# Patient Record
Sex: Male | Born: 1935
Health system: Southern US, Community
[De-identification: ages and names within clinical notes are randomized; demographics above are authoritative.]

## PROBLEM LIST (undated history)

## (undated) DIAGNOSIS — K449 Diaphragmatic hernia without obstruction or gangrene: Secondary | ICD-10-CM

## (undated) DIAGNOSIS — Z5189 Encounter for other specified aftercare: Secondary | ICD-10-CM

## (undated) DIAGNOSIS — K222 Esophageal obstruction: Secondary | ICD-10-CM

## (undated) DIAGNOSIS — IMO0001 Reserved for inherently not codable concepts without codable children: Secondary | ICD-10-CM

## (undated) DIAGNOSIS — E213 Hyperparathyroidism, unspecified: Secondary | ICD-10-CM

## (undated) DIAGNOSIS — G2581 Restless legs syndrome: Secondary | ICD-10-CM

## (undated) DIAGNOSIS — J45909 Unspecified asthma, uncomplicated: Secondary | ICD-10-CM

## (undated) DIAGNOSIS — I10 Essential (primary) hypertension: Secondary | ICD-10-CM

## (undated) DIAGNOSIS — K635 Polyp of colon: Secondary | ICD-10-CM

## (undated) DIAGNOSIS — J189 Pneumonia, unspecified organism: Secondary | ICD-10-CM

## (undated) DIAGNOSIS — B3781 Candidal esophagitis: Secondary | ICD-10-CM

## (undated) DIAGNOSIS — D518 Other vitamin B12 deficiency anemias: Secondary | ICD-10-CM

## (undated) DIAGNOSIS — T7840XA Allergy, unspecified, initial encounter: Secondary | ICD-10-CM

## (undated) DIAGNOSIS — M1990 Unspecified osteoarthritis, unspecified site: Secondary | ICD-10-CM

## (undated) DIAGNOSIS — R296 Repeated falls: Secondary | ICD-10-CM

## (undated) DIAGNOSIS — G4733 Obstructive sleep apnea (adult) (pediatric): Secondary | ICD-10-CM

## (undated) DIAGNOSIS — K5730 Diverticulosis of large intestine without perforation or abscess without bleeding: Secondary | ICD-10-CM

## (undated) DIAGNOSIS — K219 Gastro-esophageal reflux disease without esophagitis: Secondary | ICD-10-CM

## (undated) DIAGNOSIS — K648 Other hemorrhoids: Secondary | ICD-10-CM

## (undated) HISTORY — DX: Diverticulosis of large intestine without perforation or abscess without bleeding: K57.30

## (undated) HISTORY — DX: Unspecified osteoarthritis, unspecified site: M19.90

## (undated) HISTORY — PX: EYE SURGERY: SHX253

## (undated) HISTORY — PX: LUMBAR FUSION: SHX111

## (undated) HISTORY — DX: Esophageal obstruction: K22.2

## (undated) HISTORY — DX: Candidal esophagitis: B37.81

## (undated) HISTORY — PX: PICC LINE PLACE PERIPHERAL (ARMC HX): HXRAD1248

## (undated) HISTORY — PX: CATARACT EXTRACTION: SUR2

## (undated) HISTORY — DX: Reserved for inherently not codable concepts without codable children: IMO0001

## (undated) HISTORY — DX: Other hemorrhoids: K64.8

## (undated) HISTORY — DX: Obstructive sleep apnea (adult) (pediatric): G47.33

## (undated) HISTORY — DX: Unspecified asthma, uncomplicated: J45.909

## (undated) HISTORY — DX: Encounter for other specified aftercare: Z51.89

## (undated) HISTORY — PX: OTHER SURGICAL HISTORY: SHX169

## (undated) HISTORY — DX: Restless legs syndrome: G25.81

## (undated) HISTORY — DX: Other vitamin B12 deficiency anemias: D51.8

## (undated) HISTORY — DX: Allergy, unspecified, initial encounter: T78.40XA

## (undated) HISTORY — DX: Diaphragmatic hernia without obstruction or gangrene: K44.9

## (undated) HISTORY — DX: Polyp of colon: K63.5

## (undated) HISTORY — DX: Hyperparathyroidism, unspecified: E21.3

## (undated) HISTORY — DX: Gastro-esophageal reflux disease without esophagitis: K21.9

## (undated) HISTORY — PX: COLONOSCOPY: SHX174

---

## 1996-06-16 HISTORY — PX: TOTAL KNEE ARTHROPLASTY: SHX125

## 1996-06-16 HISTORY — PX: HIP SURGERY: SHX245

## 2000-07-17 DIAGNOSIS — B3781 Candidal esophagitis: Secondary | ICD-10-CM

## 2000-07-17 HISTORY — DX: Candidal esophagitis: B37.81

## 2000-07-28 ENCOUNTER — Ambulatory Visit (HOSPITAL_COMMUNITY): Admission: RE | Admit: 2000-07-28 | Discharge: 2000-07-28 | Payer: Self-pay | Admitting: Gastroenterology

## 2001-07-07 ENCOUNTER — Ambulatory Visit (HOSPITAL_COMMUNITY): Admission: RE | Admit: 2001-07-07 | Discharge: 2001-07-07 | Payer: Self-pay | Admitting: Gastroenterology

## 2001-07-07 ENCOUNTER — Encounter: Payer: Self-pay | Admitting: Gastroenterology

## 2001-08-27 ENCOUNTER — Ambulatory Visit (HOSPITAL_COMMUNITY): Admission: RE | Admit: 2001-08-27 | Discharge: 2001-08-27 | Payer: Self-pay | Admitting: Internal Medicine

## 2001-08-27 ENCOUNTER — Encounter: Payer: Self-pay | Admitting: Internal Medicine

## 2002-05-27 DIAGNOSIS — K635 Polyp of colon: Secondary | ICD-10-CM

## 2002-05-27 DIAGNOSIS — K222 Esophageal obstruction: Secondary | ICD-10-CM

## 2002-05-27 DIAGNOSIS — D126 Benign neoplasm of colon, unspecified: Secondary | ICD-10-CM

## 2002-05-27 HISTORY — DX: Esophageal obstruction: K22.2

## 2002-05-27 HISTORY — DX: Polyp of colon: K63.5

## 2002-08-12 ENCOUNTER — Ambulatory Visit (HOSPITAL_COMMUNITY): Admission: RE | Admit: 2002-08-12 | Discharge: 2002-08-12 | Payer: Self-pay | Admitting: Gastroenterology

## 2002-08-12 ENCOUNTER — Encounter: Payer: Self-pay | Admitting: Gastroenterology

## 2003-01-10 ENCOUNTER — Ambulatory Visit (HOSPITAL_COMMUNITY): Admission: RE | Admit: 2003-01-10 | Discharge: 2003-01-10 | Payer: Self-pay | Admitting: Gastroenterology

## 2003-02-22 ENCOUNTER — Ambulatory Visit (HOSPITAL_COMMUNITY): Admission: RE | Admit: 2003-02-22 | Discharge: 2003-02-22 | Payer: Self-pay | Admitting: Urology

## 2003-02-22 ENCOUNTER — Encounter: Payer: Self-pay | Admitting: Urology

## 2003-02-22 ENCOUNTER — Ambulatory Visit (HOSPITAL_BASED_OUTPATIENT_CLINIC_OR_DEPARTMENT_OTHER): Admission: RE | Admit: 2003-02-22 | Discharge: 2003-02-22 | Payer: Self-pay | Admitting: Urology

## 2003-12-20 ENCOUNTER — Ambulatory Visit (HOSPITAL_COMMUNITY): Admission: RE | Admit: 2003-12-20 | Discharge: 2003-12-20 | Payer: Self-pay | Admitting: Urology

## 2003-12-20 ENCOUNTER — Ambulatory Visit (HOSPITAL_BASED_OUTPATIENT_CLINIC_OR_DEPARTMENT_OTHER): Admission: RE | Admit: 2003-12-20 | Discharge: 2003-12-20 | Payer: Self-pay | Admitting: Urology

## 2004-04-19 ENCOUNTER — Ambulatory Visit: Payer: Self-pay | Admitting: Gastroenterology

## 2004-06-16 DIAGNOSIS — K648 Other hemorrhoids: Secondary | ICD-10-CM

## 2004-06-16 HISTORY — DX: Other hemorrhoids: K64.8

## 2005-02-21 ENCOUNTER — Ambulatory Visit: Payer: Self-pay | Admitting: Gastroenterology

## 2005-03-05 ENCOUNTER — Ambulatory Visit: Payer: Self-pay | Admitting: Gastroenterology

## 2005-05-23 ENCOUNTER — Ambulatory Visit: Payer: Self-pay | Admitting: Gastroenterology

## 2005-05-30 ENCOUNTER — Ambulatory Visit: Payer: Self-pay | Admitting: Gastroenterology

## 2006-02-20 ENCOUNTER — Ambulatory Visit: Payer: Self-pay | Admitting: Gastroenterology

## 2006-05-20 ENCOUNTER — Ambulatory Visit (HOSPITAL_COMMUNITY): Admission: RE | Admit: 2006-05-20 | Discharge: 2006-05-20 | Payer: Self-pay | Admitting: Internal Medicine

## 2006-05-29 ENCOUNTER — Encounter: Admission: RE | Admit: 2006-05-29 | Discharge: 2006-05-29 | Payer: Self-pay | Admitting: Orthopedic Surgery

## 2006-06-16 DIAGNOSIS — K219 Gastro-esophageal reflux disease without esophagitis: Secondary | ICD-10-CM

## 2006-06-16 HISTORY — PX: PARATHYROIDECTOMY: SHX19

## 2006-06-16 HISTORY — DX: Gastro-esophageal reflux disease without esophagitis: K21.9

## 2006-07-10 ENCOUNTER — Ambulatory Visit (HOSPITAL_COMMUNITY): Admission: RE | Admit: 2006-07-10 | Discharge: 2006-07-10 | Payer: Self-pay | Admitting: Internal Medicine

## 2006-10-23 ENCOUNTER — Ambulatory Visit (HOSPITAL_COMMUNITY): Admission: RE | Admit: 2006-10-23 | Discharge: 2006-10-23 | Payer: Self-pay | Admitting: Internal Medicine

## 2006-10-27 ENCOUNTER — Ambulatory Visit (HOSPITAL_COMMUNITY): Admission: RE | Admit: 2006-10-27 | Discharge: 2006-10-27 | Payer: Self-pay | Admitting: Internal Medicine

## 2006-11-23 ENCOUNTER — Encounter (HOSPITAL_COMMUNITY): Admission: RE | Admit: 2006-11-23 | Discharge: 2007-02-21 | Payer: Self-pay | Admitting: Surgery

## 2007-01-01 ENCOUNTER — Encounter: Admission: RE | Admit: 2007-01-01 | Discharge: 2007-01-01 | Payer: Self-pay | Admitting: Sports Medicine

## 2007-01-05 ENCOUNTER — Encounter: Admission: RE | Admit: 2007-01-05 | Discharge: 2007-01-05 | Payer: Self-pay | Admitting: Sports Medicine

## 2007-01-11 ENCOUNTER — Ambulatory Visit (HOSPITAL_COMMUNITY): Admission: RE | Admit: 2007-01-11 | Discharge: 2007-01-12 | Payer: Self-pay | Admitting: Surgery

## 2007-01-11 ENCOUNTER — Encounter (INDEPENDENT_AMBULATORY_CARE_PROVIDER_SITE_OTHER): Payer: Self-pay | Admitting: Surgery

## 2007-02-19 ENCOUNTER — Encounter: Admission: RE | Admit: 2007-02-19 | Discharge: 2007-02-19 | Payer: Self-pay | Admitting: Sports Medicine

## 2007-03-03 ENCOUNTER — Encounter: Admission: RE | Admit: 2007-03-03 | Discharge: 2007-03-03 | Payer: Self-pay | Admitting: Sports Medicine

## 2007-04-14 ENCOUNTER — Inpatient Hospital Stay (HOSPITAL_COMMUNITY): Admission: RE | Admit: 2007-04-14 | Discharge: 2007-04-17 | Payer: Self-pay | Admitting: Orthopedic Surgery

## 2007-05-10 ENCOUNTER — Ambulatory Visit: Payer: Self-pay

## 2007-05-18 ENCOUNTER — Ambulatory Visit (HOSPITAL_COMMUNITY): Admission: RE | Admit: 2007-05-18 | Discharge: 2007-05-18 | Payer: Self-pay | Admitting: Chiropractic Medicine

## 2007-05-18 ENCOUNTER — Ambulatory Visit: Payer: Self-pay | Admitting: Surgery

## 2007-05-18 ENCOUNTER — Encounter (INDEPENDENT_AMBULATORY_CARE_PROVIDER_SITE_OTHER): Payer: Self-pay | Admitting: Sports Medicine

## 2007-05-27 ENCOUNTER — Ambulatory Visit: Payer: Self-pay | Admitting: Gastroenterology

## 2007-05-27 LAB — CONVERTED CEMR LAB
AST: 50 units/L — ABNORMAL HIGH (ref 0–37)
Basophils Relative: 0.7 % (ref 0.0–1.0)
Bilirubin, Direct: 0.1 mg/dL (ref 0.0–0.3)
Eosinophils Absolute: 0.6 10*3/uL (ref 0.0–0.6)
Folate: 11.7 ng/mL
Hemoglobin: 10.8 g/dL — ABNORMAL LOW (ref 13.0–17.0)
Lymphocytes Relative: 26.3 % (ref 12.0–46.0)
MCV: 91.3 fL (ref 78.0–100.0)
Neutro Abs: 3 10*3/uL (ref 1.4–7.7)
Platelets: 207 10*3/uL (ref 150–400)
RBC: 3.53 M/uL — ABNORMAL LOW (ref 4.22–5.81)
RDW: 15.1 % — ABNORMAL HIGH (ref 11.5–14.6)
Total Protein: 6.7 g/dL (ref 6.0–8.3)
Transferrin: 188.4 mg/dL — ABNORMAL LOW (ref >=212.0)
Vitamin B-12: 1087 pg/mL — ABNORMAL HIGH (ref 211–911)
WBC: 5.5 10*3/uL (ref 4.5–10.5)

## 2007-05-31 ENCOUNTER — Encounter: Payer: Self-pay | Admitting: Gastroenterology

## 2007-05-31 ENCOUNTER — Ambulatory Visit: Payer: Self-pay | Admitting: Gastroenterology

## 2007-05-31 DIAGNOSIS — K5730 Diverticulosis of large intestine without perforation or abscess without bleeding: Secondary | ICD-10-CM | POA: Insufficient documentation

## 2007-06-14 DIAGNOSIS — G2581 Restless legs syndrome: Secondary | ICD-10-CM

## 2007-06-14 DIAGNOSIS — K219 Gastro-esophageal reflux disease without esophagitis: Secondary | ICD-10-CM | POA: Insufficient documentation

## 2007-06-14 DIAGNOSIS — D518 Other vitamin B12 deficiency anemias: Secondary | ICD-10-CM

## 2007-06-24 ENCOUNTER — Encounter: Admission: RE | Admit: 2007-06-24 | Discharge: 2007-06-24 | Payer: Self-pay | Admitting: Sports Medicine

## 2008-06-26 ENCOUNTER — Encounter: Payer: Self-pay | Admitting: Gastroenterology

## 2008-07-14 ENCOUNTER — Ambulatory Visit: Payer: Self-pay | Admitting: Gastroenterology

## 2008-07-14 LAB — CONVERTED CEMR LAB
Tissue Transglutaminase Ab, IgA: 0.5 units (ref <7)
Transferrin: 256 mg/dL (ref >=212.0)
Vitamin B-12: 201 pg/mL — ABNORMAL LOW (ref 211–911)

## 2008-07-27 ENCOUNTER — Ambulatory Visit: Payer: Self-pay | Admitting: Gastroenterology

## 2008-07-27 LAB — CONVERTED CEMR LAB
OCCULT 1: NEGATIVE
OCCULT 3: NEGATIVE
OCCULT 5: NEGATIVE

## 2008-09-19 ENCOUNTER — Encounter: Payer: Self-pay | Admitting: Gastroenterology

## 2009-03-06 ENCOUNTER — Ambulatory Visit (HOSPITAL_COMMUNITY): Admission: RE | Admit: 2009-03-06 | Discharge: 2009-03-06 | Payer: Self-pay | Admitting: Internal Medicine

## 2009-08-20 ENCOUNTER — Telehealth: Payer: Self-pay | Admitting: Gastroenterology

## 2010-03-28 ENCOUNTER — Ambulatory Visit: Admission: AD | Admit: 2010-03-28 | Discharge: 2010-03-28 | Payer: Self-pay | Admitting: Internal Medicine

## 2010-03-28 ENCOUNTER — Encounter: Payer: Self-pay | Admitting: Pulmonary Disease

## 2010-05-03 ENCOUNTER — Ambulatory Visit: Payer: Self-pay | Admitting: Pulmonary Disease

## 2010-05-03 DIAGNOSIS — G4733 Obstructive sleep apnea (adult) (pediatric): Secondary | ICD-10-CM | POA: Insufficient documentation

## 2010-05-03 DIAGNOSIS — J45909 Unspecified asthma, uncomplicated: Secondary | ICD-10-CM | POA: Insufficient documentation

## 2010-05-03 DIAGNOSIS — H409 Unspecified glaucoma: Secondary | ICD-10-CM | POA: Insufficient documentation

## 2010-05-03 DIAGNOSIS — E213 Hyperparathyroidism, unspecified: Secondary | ICD-10-CM

## 2010-06-21 ENCOUNTER — Encounter: Payer: Self-pay | Admitting: Pulmonary Disease

## 2010-07-18 NOTE — Letter (Signed)
Summary: CMN/[facility]  CMN/[facility]   Imported By: Lester Taylor 06/28/2010 08:01:21  _____________________________________________________________________  External Attachment:    Type:   Image     Comment:   External Document

## 2010-07-18 NOTE — Assessment & Plan Note (Signed)
Summary: consult for osa   Visit Type:  Initial Consult Copy to:  [name] MD Primary Calayah Guadarrama/Referring Venetta Knee:  [name], MD  CC:  Sleep Consult. pt c/o pt wakes up multiple times during the night and restless legs bothers him at night and he is up for an hr due to that.  History of Present Illness: The pt is a 77y/o male who I have been asked to see for management of osa.  He recently underwent split night study where he was found to have moderate osa with AHI of 25/hr and desat to 85%.  He was then fitted with a medium resmed quattro FFM, and went thru a titration process where he was tried on cpap up to 11cm, then ultimately ended up on bilevel 15/11.  It is unclear from the available data why he was changed over to bilevel.  The pt has been told that he has loud snoring, as well as an abnormal breathing pattern during sleep.  He goes to bed at 1130pm, and arises at 5am to start his day.  He has frequent awakenings during the night, but feels adequately rested on arising.  During the day however, he will fall asleep easily if he sits down to read, watch tv, or even with eating.  He can also fall asleep during conversations.  He notes some sleepiness with driving.  The pt states his weight is neutral over the last 2 yrs.  Current Medications (verified): 1)  Mirapex 1 Mg Tabs (Pramipexole Dihydrochloride) .... Take Two By Mouth Once Daily 2)  Advair Diskus 100-50 Mcg/dose Misc (Fluticasone-Salmeterol) .... Take 2 Puffs Once Daily As Needed 3)  Allbee/c  Tabs (B Complex-C) .... Take One By Mouth Once Daily 4)  Nascobal 500 Mcg/0.41ml Soln (Cyanocobalamin) .... One Spray, One Nostril, Once A Week 5)  Vit B12 Injections .... Once A Month  Allergies (verified): No Known Drug Allergies  Past History:  Past Medical History:  OBSTRUCTIVE SLEEP APNEA (ICD-327.23)--AHI 25/hr 2011 ASTHMA (ICD-493.90) GLAUCOMA (ICD-365.9) HYPERPARATHYROIDISM UNSPECIFIED (ICD-252.00) POLYP, COLON  (ICD-211.3) ANEMIA, VITAMIN B12 DEFICIENCY (ICD-281.1) RESTLESS LEG SYNDROME (ICD-333.94) GASTROESOPHAGEAL REFLUX DISEASE (ICD-530.81) DIVERTICULOSIS, COLON (ICD-562.10)      Past Surgical History: parathyroidectomy Knee Replacement 10/09 left  Hip Replacement right 3/09 hand surgery  Family History: Reviewed history from 07/14/2008 and no changes required. FH of Brain Cancer:3 brothers Family History of Heart Disease: Father, Sister, both deceased MI asthma: father  Social History: Reviewed history from 07/14/2008 and no changes required. 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  Alcohol Use - no Daily Caffeine Use Pepsi Illicit Drug Use - no  Review of Systems       The patient complains of nasal congestion/difficulty breathing through nose, hand/feet swelling, and joint stiffness or pain.  The patient denies shortness of breath with activity, shortness of breath at rest, productive cough, non-productive cough, coughing up blood, chest pain, irregular heartbeats, acid heartburn, indigestion, loss of appetite, weight change, abdominal pain, difficulty swallowing, sore throat, tooth/dental problems, headaches, sneezing, itching, ear ache, anxiety, depression, rash, change in color of mucus, and fever.    Vital Signs:  Patient profile:   77 year old male Height:      74 inches Weight:      185.13 pounds BMI:     23.86 O2 Sat:      95 % on Room air Temp:     98.7 degrees F oral Pulse rate:   64 / minute BP sitting:  152 / 88  (left arm) Cuff size:   regular  Vitals Entered By: Carver Fila (May 03, 2010 3:53 PM)  O2 Flow:  Room air CC: Sleep Consult. pt c/o pt wakes up multiple times during the night, restless legs bothers him at night and he is up for an hr due to that Comments meds and allergies updated Phone number updated Carver Fila  May 03, 2010 3:53 PM    Physical Exam  General:  wd male in nad Eyes:  PERRLA and EOMI.   Nose:   mild deviation to left, no obstruction Mouth:  mild elongation of soft palate and uvula Neck:  no jvd, tmg, LN Lungs:  clear to auscultation Heart:  rrr, no mrg Abdomen:  soft and nontender ,bs+ Extremities:  LLE edema >right, no cyanosis pulses intact distally Neurologic:  alert and oriented,moves all 4.   Impression & Recommendations:  Problem # 1:  OBSTRUCTIVE SLEEP APNEA (ICD-327.23) the pt has moderate osa by his most recent sleep study, and clearly has significant daytime sleepiness.  I have had a long discussion with the pt about sleep apnea, including its impact on QOL and CV health.  I have reviewed the various treatments for this degree of osa, with dental appliance and cpap being his best options.  After a discussion, the pt wishes to try cpap first.  It is really unclear from his study what his optimal pressure may be, and therefore will start him out on a moderate pressure to allow for adaptation.  Will then use auto mode at home to optimize his pressure once he is comfortable wearing the device.  The pt is agreeable to this approach.  Medications Added to Medication List This Visit: 1)  Advair Diskus 100-50 Mcg/dose Misc (Fluticasone-salmeterol) .... Take 2 puffs once daily as needed 2)  Vit B12 Injections  .... Once a month  Other Orders: Consultation Level IV (19147) DME Referral (DME)  Patient Instructions: 1)  will start on cpap to treat your sleep apnea.  Please call if having pressure tolerance issues 2)  followup with me in 5 weeks.    Immunization History:  Influenza Immunization History:    Influenza:  historical (03/16/2010)  Pneumovax Immunization History:    Pneumovax:  historical (03/16/2008)

## 2010-07-18 NOTE — Progress Notes (Signed)
Summary: Refill omeprazole   Phone Note From Pharmacy   Caller: [facility]* Summary of Call: refill requested foromeprazole 20 mg Initial call taken by: Ashok Cordia RN,  August 20, 2009 3:51 PM    Prescriptions: OMEPRAZOLE 40 MG  CPDR (OMEPRAZOLE) 1 twice a day 30 minutes before meals  #60 x 6   Entered by:   Ashok Cordia RN   Authorized by:   Mardella Layman MD [facility]   Signed by:   Ashok Cordia RN on 08/20/2009   Method used:   Electronically to        [facility]* (retail)       [address]       [place], [place]  [address]       Ph: [phone]       Fax: [phone]   RxID:   [id number]   Appended Document: Refill omeprazole Dose correction.  Should be 20 mg   Clinical Lists Changes  Medications: Changed medication from OMEPRAZOLE 40 MG  CPDR (OMEPRAZOLE) 1 twice a day 30 minutes before meals to OMEPRAZOLE 20 MG  CPDR (OMEPRAZOLE) 1 twice a day 30 minutes before meals - Signed Rx of OMEPRAZOLE 20 MG  CPDR (OMEPRAZOLE) 1 twice a day 30 minutes before meals;  #60 x 6;  Signed;  Entered by: Ashok Cordia RN;  Authorized by: Mardella Layman MD [facility];  Method used: Electronically to [facility]*, [address] [place], [place], [place]  [address], Ph: [phone], Fax: [phone]    Prescriptions: OMEPRAZOLE 20 MG  CPDR (OMEPRAZOLE) 1 twice a day 30 minutes before meals  #60 x 6   Entered by:   Ashok Cordia RN   Authorized by:   Mardella Layman MD [facility]   Signed by:   Ashok Cordia RN on 08/21/2009   Method used:   Electronically to        [facility]* (retail)       [address]       [place], [place]  [address]       Ph: [phone]       Fax: [phone]   RxID:   [id number]

## 2010-07-29 ENCOUNTER — Encounter: Payer: Self-pay | Admitting: Pulmonary Disease

## 2010-07-29 ENCOUNTER — Ambulatory Visit (INDEPENDENT_AMBULATORY_CARE_PROVIDER_SITE_OTHER): Payer: Medicare Other | Admitting: Pulmonary Disease

## 2010-07-29 DIAGNOSIS — G4733 Obstructive sleep apnea (adult) (pediatric): Secondary | ICD-10-CM

## 2010-07-30 ENCOUNTER — Telehealth (INDEPENDENT_AMBULATORY_CARE_PROVIDER_SITE_OTHER): Payer: Self-pay | Admitting: *Deleted

## 2010-08-07 NOTE — Progress Notes (Signed)
Summary: need cpap re-eval notes  Phone Note From Pharmacy   Caller: tammy with [facility] Call For: clance  Summary of Call: patient was seen yesterday and she needs updated notes for his CPAP re-evaluation. She can be reached [phone] fax [phone] Initial call taken by: Vedia Coffer,  July 30, 2010 9:17 AM  Follow-up for Phone Call        [facility] for Tammy at [facility] to call back. Abigail Miyamoto RN  July 30, 2010 11:06 AM   Tammy advised [facility] has not completed note yet.  I will forward message to [facility] to let him know we need OV note completed so we can send to [facility]. Carron Curie CMA  July 30, 2010 12:00 PM   Additional Follow-up for Phone Call Additional follow up Details #1::        we do not send notes to dme companies.  Those are pt's private records.  If they need something documenting he was seen and is doing well, would be happy to write something up that we can send over to them.  Let me know   Additional Follow-up by: Barbaraann Share MD,  July 30, 2010 1:59 PM    Additional Follow-up for Phone Call Additional follow up Details #2::    Spoke with Tammy and notified of the above response per [facility].  She states that it would be okay to just send summary of the visit, stating why the pt was here and if they are compliant with CPAP or not.  Pls advise thanks Follow-up by: Vernie Murders,  July 30, 2010 2:02 PM  Additional Follow-up for Phone Call Additional follow up Details #3:: Details for Additional Follow-up Action Taken: put note in box  Note was faxed to Tammy's attn at the number provided and placed in [facility] scan folder.  Vernie Murders  July 30, 2010 2:21 PM  Additional Follow-up by: Barbaraann Share MD,  July 30, 2010 2:05 PM

## 2010-08-09 ENCOUNTER — Encounter: Payer: Self-pay | Admitting: Pulmonary Disease

## 2010-08-13 NOTE — Assessment & Plan Note (Signed)
Summary: Derrick Sanchez for osa   Copy to:  Derrick Perches MD Primary Provider/Referring Provider:  [name], MD  CC:  Overdue f/u appt for OSA.  Pt states he is wearing his cpap machine "most every night."  Appreox 4 1/2 hours per night.  Pt states mask "hurts his nose."  Pt denies any complaints with pressure. Marland Kitchen  History of Present Illness: The pt comes in today for f/u of his osa.  He was started on cpap last visit at moderate pressure level, and has been wearing very compliantly by his recent download.  He has no issues with pressure tolerance, but is having some mask fit issues.  He is noting soreness over the bridge of his nose.  The pt states he is definitely sleeping better at night, and has seen improved alertness during the day.    Medications Prior to Update: 1)  Mirapex 1 Mg Tabs (Pramipexole Dihydrochloride) .... Take Two By Mouth Once Daily 2)  Advair Diskus 100-50 Mcg/dose Misc (Fluticasone-Salmeterol) .... Take 2 Puffs Once Daily As Needed 3)  Allbee/c  Tabs (B Complex-C) .... Take One By Mouth Once Daily 4)  Nascobal 500 Mcg/0.49ml Soln (Cyanocobalamin) .... One Spray, One Nostril, Once A Week 5)  Vit B12 Injections .... Once A Month  Allergies (verified): No Known Drug Allergies  Past History:  Past medical, surgical, family and social histories (including risk factors) reviewed, and no changes noted (except as noted below).  Past Medical History: Reviewed history from 05/03/2010 and no changes required.  OBSTRUCTIVE SLEEP APNEA (ICD-327.23)--AHI 25/hr 2011 ASTHMA (ICD-493.90) GLAUCOMA (ICD-365.9) HYPERPARATHYROIDISM UNSPECIFIED (ICD-252.00) POLYP, COLON (ICD-211.3) ANEMIA, VITAMIN B12 DEFICIENCY (ICD-281.1) RESTLESS LEG SYNDROME (ICD-333.94) GASTROESOPHAGEAL REFLUX DISEASE (ICD-530.81) DIVERTICULOSIS, COLON (ICD-562.10)      Past Surgical History: Reviewed history from 05/03/2010 and no changes required. parathyroidectomy Knee Replacement 10/09 left  Hip Replacement  right 3/09 hand surgery  Family History: Reviewed history from 05/03/2010 and no changes required. FH of Brain Cancer:3 brothers Family History of Heart Disease: Father, Sister, both deceased MI asthma: father  Social History: Reviewed history from 05/03/2010 and no changes required. 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  Alcohol Use - no Daily Caffeine Use Pepsi Illicit Drug Use - no  Review of Systems       The patient complains of shortness of breath with activity, hand/feet swelling, and joint stiffness or pain.  The patient denies shortness of breath at rest, productive cough, non-productive cough, coughing up blood, chest pain, irregular heartbeats, acid heartburn, indigestion, loss of appetite, weight change, abdominal pain, difficulty swallowing, sore throat, tooth/dental problems, headaches, nasal congestion/difficulty breathing through nose, sneezing, itching, ear ache, anxiety, depression, rash, change in color of mucus, and fever.    Vital Signs:  Patient profile:   77 year old male Height:      74 inches Weight:      190.13 pounds BMI:     24.50 O2 Sat:      96 % on Room air Temp:     97.6 degrees F oral Pulse rate:   76 / minute BP sitting:   186 / 90  (right arm) Cuff size:   large  Vitals Entered By: Arman Filter LPN (July 29, 2010 10:14 AM)  O2 Flow:  Room air CC: Overdue f/u appt for OSA.  Pt states he is wearing his cpap machine "most every night."  Appreox 4 1/2 hours per night.  Pt states mask "hurts his nose."  Pt denies any complaints  with pressure.  Comments Medications reviewed with patient Arman Filter LPN  July 29, 2010 10:14 AM    Physical Exam  General:  wd male in nad Nose:  minimal erythema on bridge of nose, no skin breakdown or pressure necrosis from cpap mask Lungs:  clear Heart:  rrr Extremities:  no significant edema or cyanosis  Neurologic:  alert, does not appear sleepy, moves all  4.   Impression & Recommendations:  Problem # 1:  OBSTRUCTIVE SLEEP APNEA (ICD-327.23) the pt is doing fairly well with cpap, and has seen improvement in his sleep and daytime alertness.  He is having pressure over the bridge of his nose with his current mask, and I have discussed other possible options with him that he can consider.  We need to optimize his cpap pressure with an auto device, and I have explained the process to him. Care Plan:  At this point, will arrange for the patient's machine to be changed over to auto mode for 2 weeks to optimize their pressure.  I will review the downloaded data once sent by dme, and also evaluate for compliance, leaks, and residual osa.  I will call the patient and dme to discuss the results, and have the patient's machine set appropriately.  This will serve as the pt's cpap pressure titration.  Other Orders: Est. Patient Level IV (25956) DME Referral (DME)  Patient Instructions: 1)  will put your machine on auto for the next 2 weeks to re-optimize your pressure.  I will let you know the results. 2)  consider looking at a resmed mirage quattro FX if you continue to have pressure area on the bridge of your nose. 3)  If you are doing well, followup with me in 6mos.  Please call if still having issues.

## 2010-08-13 NOTE — Letter (Signed)
Summary: Physician's handwritten letter/[facility]  Physician's handwritten letter/ [facility]   Imported By: Sherian Rein 08/09/2010 11:48:15  _____________________________________________________________________  External Attachment:    Type:   Image     Comment:   External Document

## 2010-09-16 ENCOUNTER — Other Ambulatory Visit: Payer: Self-pay | Admitting: Pulmonary Disease

## 2010-09-16 DIAGNOSIS — G4733 Obstructive sleep apnea (adult) (pediatric): Secondary | ICD-10-CM

## 2010-10-29 NOTE — Op Note (Signed)
NAMESHANTANU, STRAUCH               ACCOUNT NO.:  [id number]   MEDICAL RECORD NO.:  [id number]          PATIENT TYPE:  OIB   LOCATION:  5740                         FACILITY:  [facility]   PHYSICIAN:  Velora Heckler, MD      DATE OF BIRTH:  1930/11/28   DATE OF PROCEDURE:  01/11/2007  DATE OF DISCHARGE:                               OPERATIVE REPORT   PREOPERATIVE DIAGNOSIS:  Primary hyperparathyroidism.   POSTOPERATIVE DIAGNOSIS:  Primary hyperparathyroidism.   PROCEDURE:  Minimally invasive right inferior parathyroidectomy.   SURGEON:  Velora Heckler, MD, FACS   ANESTHESIA:  General per Dr. Hart Robinsons   ESTIMATED BLOOD LOSS:  Minimal.   PREPARATION:  Betadine.   COMPLICATIONS:  None.   INDICATIONS:  The patient is a 77 year old white male from [place],  [place].  The patient was found by his primary physician, Dr. [name], to have elevated calcium levels.  Calcium levels ranged from 12.0-  12.2.  Parathyroid hormone level was checked in May 2008 was elevated at  93.8.  The patient subsequently underwent a sestamibi scan on November 23, 2006.  This was positive for a right inferior parathyroid adenoma.  The  patient was then evaluated and prepared for the operating room.   BODY OF REPORT:  The procedure was done in OR #16 at the [facility].  The patient was brought to the operating room,  placed in a supine position on the operating room table.  Following  administration of general anesthesia, the patient is positioned and then  prepped and draped in the usual strict aseptic fashion.  After  ascertaining that an adequate level of anesthesia had been obtained, a  right inferior neck incision was made a #10 blade.  Dissection was  carried through subcutaneous tissues and platysma using the  electrocautery for hemostasis.  Subplatysmal flaps were elevated and a  Wheatlander retractor was placed for exposure.  An external jugular vein  was  ligated in continuity and divided.  The midline was incised and the  strap muscles were reflected to the right.  The thyroid was quite low.  With the immobility of the patient's cervical spine, the thyroid lies at  approximately the level of the sternal notch.  With gentle dissection  the right thyroid lobe was exposed.  It was partially cystic.  It was  gently mobilized, strap muscles and jugular vein were reflected  laterally.  In the tracheoesophageal groove the recurrent nerve was  identified and preserved.  The lateral border of the esophagus was  finally visualized, and just anterior to the cervical fascia there was a  nodular density consistent with parathyroid adenoma.  This measures  approximately 1.5 cm in greatest dimension.  It was gently dissected  out.  In order to fully expose the parathyroid adenoma, some venous  tributaries to the thyroid gland were divided between Ligaclips.  The  parathyroid gland was then gently mobilized with blunt dissection.  A  vascular pedicle was then divided between medium Ligaclips and the gland  was excised.  The entire  parathyroid gland was submitted to pathology,  where frozen section confirmed hyperplastic parathyroid tissue  consistent with adenoma.  Good hemostasis was noted.  Surgicel was  placed in the operative field.  The thyroid gland was returned to its  normal location.  Strap muscles were closed in the midline with  interrupted 3-0 Vicryl sutures.  Platysma was closed with interrupted 3-  0 Vicryl sutures.  The skin edges were anesthetized with local  anesthetic.  Skin edges were reapproximated with a running 4-0 Vicryl  subcuticular suture.  The wound was washed and dried, and Benzoin and  Steri-Strips were applied.  Sterile dressings were applied.  The patient  was awakened from anesthesia and brought to the recovery room in stable  condition.  The patient tolerated the procedure well.      Velora Heckler, MD  Electronically  Signed     TMG/MEDQ  D:  01/11/2007  T:  01/11/2007  Job:  [id number]   cc:   Kingsley Callander. Ouida Sills, MD

## 2010-10-29 NOTE — Assessment & Plan Note (Signed)
[facility]                         GASTROENTEROLOGY OFFICE NOTE   AIJALON, KIRTZ                      MRN:          [id number]  DATE:05/27/2007                            DOB:          02-28-1931    Derrick Sanchez is fairly asymptomatic from a GI standpoint.  He is taking Nexium  40 mg twice a day.  He has a scleroderma esophagus with chronic acid  reflux managed by taking twice a day Nexium.   He has had, since I last saw him, two knee replacements, hip  replacement, and removal of a parathyroid adenoma.  He has chronic  anemia which apparently has worsened and has been started on iron  therapy by Dr. Ouida Sills.  On reviewing his record, I do see where he has a  long history of B12 deficiency.  He has received parenteral replacement  therapy and is supposed to be on nasal B12 which he is not taking at  this time.  As mentioned above, he denies dyspepsia, dysphagia, melena,  hematochezia, lower GI or hepatobiliary problems at this time.  He did  have a colonoscopy in December 2003, with removal of a tubulovillous  adenoma and is due for followup colonoscopy exam.  His last endoscopy  also was 5 years ago.  The patient apparently has not done hemoccult  cards recently.   He has restless leg syndrome in addition to his degenerative arthritis  requiring multiple surgical procedures.   He currently in addition to Nexium, is taking:  1. Mirapex 2 mg a day.  2. Gabapentin 2 tablets several times a day.  3. Advair inhalers twice a day.  4. Diovan 80 mg a day.  5. A variety of multivitamins.   He denies drug allergies.   He has seen a pulmonologist in the past and has a history of asthmatic  bronchitis.  He also has a previous history of chronic anxiety syndrome  but I cannot see where this has been a problem recently.   PHYSICAL EXAMINATION:  VITAL SIGNS:  He weighs 176 pounds, blood  pressure 140/88, pulse 80 and regular.  GENERAL:  Not performed at  this time.   IMPRESSION:  1. Outpatient endoscopy and colonoscopy followup for his history of      polyps and his iron-deficiency anemia which may be related to his      multiple surgical procedures.  2. Chronic gastroesophageal reflux disease with esophageal motility      disorder, all doing well on 2-3 times a day proton pump inhibitor      therapy.  3. Removal of a parathyroid adenoma by Dr. Gerrit Friends.  4. Chronic restless leg syndrome.  5. History of chronic B12 deficiency of unexplained etiology.   RECOMMENDATIONS:  1. Continue reflux regimen and twice a day PPI therapy.  2. Outpatient endoscopy and colonoscopy followup.  3. Renew Nexium and B12 nasal spray.  4. Check followup CBC, iron levels, B12 level, and celiac panel.  5. Continue other medications per Dr. Ouida Sills.     Vania Rea. Jarold Motto, MD, Caleen Essex, FAGA  Electronically Signed    DRP/MedQ  DD: 05/27/2007  DT: 05/27/2007  Job #: [id number]

## 2010-10-29 NOTE — Discharge Summary (Signed)
NAMEELIZEO, RODRIQUES               ACCOUNT NO.:  [id number]   MEDICAL RECORD NO.:  [id number]          PATIENT TYPE:  INP   LOCATION:  1610                         FACILITY:  [facility]   PHYSICIAN:  Loreta Ave, M.D. DATE OF BIRTH:  12-26-1930   DATE OF ADMISSION:  04/14/2007  DATE OF DISCHARGE:  04/17/2007                               DISCHARGE SUMMARY   FINAL DIAGNOSES:  1. Status post right total hip replacement for end-stage degenerative      joint disease.  2. Gastroesophageal reflux disease.  3. Chronic obstructive pulmonary disease/asthma.  4. Hypertension.   HISTORY OF PRESENT ILLNESS:  This is a 77 year old, white male with a  history of end-stage DJD right hip and chronic pain who presented to our  office for preoperative evaluation for a total hip replacement.  He had  progressive, worsening pain with failed response to conservative  treatment.  He had significant decrease in daily activities due to the  above complaint.   HOSPITAL COURSE:  On 04/14/2007, the patient was taken to the [facility]  operating room and a total hip replacement procedure was performed.  Surgeon Loreta Ave, MD and assistant Zonia Kief, PAC.  Anesthesia  general.  No specimens.  EBL minimal.  There were no surgical or  anesthesia complications and the patient was transferred to recovery in  stable condition.  On April 15, 2007, the patient was doing well.  Good pain control.  No complaints of chest pain or shortness of breath.  Vital signs stable and afebrile.  Hemoglobin 7.8, hematocrit 23.4.  Sodium 138, potassium 3.8, chloride 109, CO2 21, BUN 19, creatinine  1.14, glucose 121 and INR 1.2.  Dressing was clean, dry and intact.  Calf was nontender, neurovascular intact.  He was transfused 2 units of  packed red blood cells and given Lasix 20 mg IV after each unit, started  FPSO4 at 325 mg p.o. b.i.d.   On April 16, 2007, the patient stated he was feeling much better after  the  transfusion.  He did have a syncopal episode with therapy yesterday  afternoon.  No current complaints.  No chest pain or shortness of  breath.  Temp 99.9, pulse 85, respirations 18, blood pressure 119/66.  WBC 7.6, hematocrit 29.7, hemoglobin 10, platelets 175, sodium 137,  potassium 3.8, chloride 107, CO2 22, BUN 20, creatinine 1.26, glucose  102, INR 1.3.  The wound looked good, staples were intact.  No drainage  or signs of infection.  Calf was nontender, neurovascular intact.  The  patient stated that he has had similar fainting spells previously when  he has gone from a sitting to standing position.  Postop blood loss  anemia likely contributed to this event.  Discontinued PCA and Foley, no  tubes.  __________ .  Advised RN that patient was to transfer with  assistant.   On April 17, 2007, the patient is doing great.  Good pain control.  Ready to go home.  Progressing great with therapy.  Temp 98.6, pulse 91,  respirations 18, blood pressure 118/62.  Hemoglobin 10.2, hematocrit  30.3,  sodium 135, potassium 3.7, chloride 106, CO2 22, BUN 23,  creatinine 1.32, glucose 104, INR 1.5.  The wound looks good, staples  are intact.  No drainage or signs of infection.  Calf is nontender,  neurovascular intact.  The patient is ready for discharge home today.   CONDITION ON DISCHARGE:  Good, stable.   DISPOSITION:  Discharge to home.   MEDICATIONS:  1. Percocet 5/325 1-2 tablets p.o. every 4-6 hours p.r.n. for pain.  2. Robaxin 500 mg 1 tablet p.o. every 6 hours p.r.n. for spasms.  3. Coumadin per pharmaceutical protocol.  4. Lovenox 40 mg 1 subcu injection daily and discontinue when Coumadin      therapeutic with INR of 2-3.  5. Resume previous home meds.   DISCHARGE INSTRUCTIONS:  1. The patient will work with home PT and OT to improve hip strength      and ambulation.  2. He is weightbearing as tolerated.  3. Daily dressing changes with 4x4 gauze and tape.  4. He is okay to  shower, but no  tub soaking.  5. Coumadin x 4 weeks postoperatively for DVT prophylaxis.  6. Will followup in the office for his two-week postoperative recheck,      return sooner if needed.      Genene Churn. Denton Meek.      Loreta Ave, M.D.  Electronically Signed    JMO/MEDQ  D:  06/11/2007  T:  06/11/2007  Job:  [id number]

## 2010-10-29 NOTE — Op Note (Signed)
Derrick Sanchez, Derrick Sanchez               ACCOUNT NO.:  [id number]   MEDICAL RECORD NO.:  [id number]          PATIENT TYPE:  INP   LOCATION:  0454                         FACILITY:  [facility]   PHYSICIAN:  Loreta Ave, M.D. DATE OF BIRTH:  04/03/1931   DATE OF PROCEDURE:  04/14/2007  DATE OF DISCHARGE:                               OPERATIVE REPORT   PREOPERATIVE DIAGNOSES:  1. Right hip femoral neck fracture, healed, with avascular necrosis.  2. Acetabular fracture, healed, with medial bony defect.  3. Resultant end-stage degenerative arthritis.   POSTOPERATIVE DIAGNOSES:  1. Right hip femoral neck fracture, healed, with avascular necrosis.  2. Acetabular fracture, healed, with medial bony defect.  3. Resultant end-stage degenerative arthritis.   PROCEDURE:  1. Conversion of right hip to total hip replacement.  2. Autologous cancellous bone graft to medial acetabulum.   Press-Fit 58-mm PSL acetabular component fixation with screws x3.  A 40-  mm internal diameter polyethylene insert.  A cemented #7 femoral  component with a 13-mm distal spacer.  A 127-degree neck angle.  A 40-mm  -2.5 mm femoral head.   SURGEON:  Loreta Ave, M.D.   ASSISTANT:  Genene Churn. Denton Meek., present throughout the entire case.   ANESTHESIA:  General.   BLOOD LOSS:  200 mL.   BLOOD GIVEN:  None.   SPECIMENS:  None.   COMPLICATIONS:  None.   DRESSING:  Soft compressive with abduction pillow.   PROCEDURE:  The patient was brought to the operating room, placed on the  operating table in supine position.  After adequate anesthesia had been  obtained, turned to a lateral position with the right side up.  Prepped  and draped in the usual sterile fashion.  An incision along the femur to  the trochanter, extending posterosuperior.  Skin and subcutaneous tissue  divided.  Iliotibial band exposed, incised.  Charnley retractor put in  place.  Neurovascular structures identified and protected.   External  rotator and capsule taken down off the back of the intertrochanteric  groove of the femur, exposing the hip.  I then did a generous capsular  release exposing all way down to the acetabulum.  There was medial  displacement of the femoral head from the acetabular fracture.  After  completely freeing this up, I then cut the femoral neck one  fingerbreadth above the lesser trochanter and then extracted the femoral  head out directly lateral so that I would not put too much stress on the  posterior wall, which had a healed fracture.  When that was done, the  acetabulum was exposed with appropriate retractors.  Grade 4 changes  throughout.  There was a central depression that still revealed a  fracture line, but this was still relatively solid but left a secondary  concavity in the acetabulum.  I reamed the central concavity of the  acetabulum up to 58 mm with good bleeding bone throughout.  On the  medial-most aspect it was then packed with cancellous bone graft  harvested from the femoral head.  Packed in place with a reamer that had  been  put in reverse to pack the graft in place.  I then seated a 58-mm  acetabular component in 45 degrees of abduction, 20 degrees of  anteversion.  Excellent capturing and fixation and compression on the  medial graft.  Although the cup was very solid, I augmented with three  screws that were placed primarily superiorly through the cup, where the  bone was most solid in the acetabulum.  I then inserted the 40-mm  internal diameter polyethylene liner.  Attention turned to the femur.  Powered handheld reamers used to open this up proximally and distally.  Sized for a #7 component.  After appropriate trials and restoring normal  femoral anteversion with the femoral component, I had a nice, stable  hip, good restoration of leg lengths, and I utilized a 127-degree neck  angle and a 40-mm head in -2.5 mm.  All trials were removed.  Cement  restricter  placed distal to the femoral component.  Copious irrigation  with a pulse irrigating device.  Pressurized cement technique to seat  the femoral component, restoring normal femoral anteversion.  Once the  cement hardened, the femoral head was attached.  Hip reduced.  Excellent  stability in flexion and extension.  Good restoration of leg lengths.  Wound irrigated.  External rotator and capsule were repaired to the back  of the intertrochanteric groove through drill holes and the FiberWire  that were used to tag this were then tied over a bony bridge.  This  reconstructed the posterior wall.  Wound irrigated.  Iliotibial band  closed with #1 Vicryl, skin and subcutaneous tissue with Vicryl and  staples.  Margins of the wound injected with Marcaine.  Sterile  compressive dressing applied.  Returned to the supine position.  Abduction pillow placed.  Anesthesia reversed.  Brought to the recovery  room.  Tolerated the surgery well with no complications.      Loreta Ave, M.D.  Electronically Signed     DFM/MEDQ  D:  04/14/2007  T:  04/14/2007  Job:  [id number]

## 2010-11-01 NOTE — Assessment & Plan Note (Signed)
[facility]                           GASTROENTEROLOGY OFFICE NOTE   Derrick Sanchez, Derrick Sanchez                        MRN:          [id number]  DATE:02/20/2006                            DOB:          1931-06-04    Derrick Sanchez continues to have regurgitation despite taking Nexium 40 mg twice  daily.  He has known scleroderma-like esophagus with associated motility  problems, confirmed by manometry.  He also complains of excessive postnasal  drainage.  He is having no dysphagia at this time or other gastrointestinal  problems.   PHYSICAL EXAMINATION:  VITAL SIGNS:  His vital signs were all stable and  blood pressure 132/68.  HEENT:  Examination of the oropharynx was unremarkable.  NECK:  Unremarkable.   ASSESSMENT:  False regurgitation:  Has mostly to do with his esophageal  motility disorder.  He obviously would not respond to Reglan therapy because  of this neuromuscular problem.   RECOMMENDATIONS:  Will try to go up on his Nexium to 40 mg 3 times a day  before meals to see if this helps his complaints.  I began reviewing the  reflux regimen with him.  I have given him some Deconamine SR to try 1-2  times a day as needed for his postnasal drip.  He is to continue his other  medications as per his primary care physician.                                   Vania Rea. Jarold Motto, MD, Clementeen Graham, [place]   DRP/MedQ  DD:  02/20/2006  DT:  02/20/2006  Job #:  [id number]

## 2010-11-01 NOTE — Op Note (Signed)
Derrick Sanchez, [name]                           ACCOUNT NO.:  [id number]   MEDICAL RECORD NO.:  [id number]                   PATIENT TYPE:  AMB   LOCATION:  NESC                                 FACILITY:  [facility]   PHYSICIAN:  Maretta Bees. Vonita Moss, M.D.             DATE OF BIRTH:  [birth year]   DATE OF PROCEDURE:  12/20/2003  DATE OF DISCHARGE:                                 OPERATIVE REPORT   PREOPERATIVE DIAGNOSES:  Right hydrocele.   POSTOPERATIVE DIAGNOSES:  Right hydrocele.   PROCEDURE:  Right hydrocelectomy.   SURGEON:  Maretta Bees. Vonita Moss, M.D.   ANESTHESIA:  General.   INDICATIONS FOR PROCEDURE:  This 77 year old white male is bothered by the  size and achiness of a right hydrocele, he would like that repaired.  He was  advised about bleeding, infections and swelling postoperatively.   DESCRIPTION OF PROCEDURE:  The patient is brought to the operating room and  placed in the supine position, the external genitalia were prepped and  draped in the usual fashion.  A transverse incision was made in the right  hemiscrotum between superficial vascularity in the scrotum. The hydrocele  was brought out of the scrotum and incised and drained of classic clear  yellow fluid. A small appendix testis was fulgurated, excess hydrocele sac  was excised and then the hydrocele sac was sutured posteriorly to prevent  recurrence using 3-0 chromic catgut. The few small scrotal bleeders were  controlled with electrocoagulation. The scrotum was then placed back in the  scrotum and the scrotal incision closed in two layers of running 3-0 chromic  catgut.  A 0.25% plain Marcaine was injected along the incision line to cut  down postoperative pain.  The wound was cleaned and dressed with dry sterile  gauze dressings after putting Neosporin on the sutures.  Scrotal support was  applied and it was taken to the recovery room in good condition with no  blood loss and the correct sponge, needle and instrument  counts were  correct.                                               Maretta Bees. Vonita Moss, M.D.    LJP/MEDQ  D:  12/20/2003  T:  12/20/2003  Job:  [id number]

## 2010-11-27 ENCOUNTER — Telehealth: Payer: Self-pay | Admitting: Pulmonary Disease

## 2010-11-27 NOTE — Telephone Encounter (Signed)
Called and spoke with pt's wife.  Wife states pt is requesting an appt with KC to discuss problems with his cpap machine and mask.  Appt made with [facility] for 6/28 at 10:15.  Wife aware and stated she will inform pt of appt date and time.

## 2010-12-05 ENCOUNTER — Telehealth: Payer: Self-pay | Admitting: Pulmonary Disease

## 2010-12-05 NOTE — Telephone Encounter (Signed)
Pt has upcoming appt with KC to discuss problems with CPAP on 12/12/10- LMOMTCB

## 2010-12-06 NOTE — Telephone Encounter (Signed)
Spoke with [employer]. She wanted to make sure KC knew that he is not being compliant with CPAP machine.  I advised that we are aware that he is having issues with CPAP and are seeing him soon.

## 2010-12-11 ENCOUNTER — Encounter: Payer: Self-pay | Admitting: Pulmonary Disease

## 2010-12-12 ENCOUNTER — Encounter: Payer: Self-pay | Admitting: Pulmonary Disease

## 2010-12-12 ENCOUNTER — Ambulatory Visit (INDEPENDENT_AMBULATORY_CARE_PROVIDER_SITE_OTHER): Payer: Medicare Other | Admitting: Pulmonary Disease

## 2010-12-12 VITALS — BP 148/78 | HR 68 | Temp 97.6°F | Ht 74.0 in | Wt 179.8 lb

## 2010-12-12 DIAGNOSIS — G4733 Obstructive sleep apnea (adult) (pediatric): Secondary | ICD-10-CM

## 2010-12-12 NOTE — Assessment & Plan Note (Signed)
The pt currently is having poor tolerance of cpap due to mask leaks and a feeling of claustrophobia.  It is unclear if this is a viable therapy for him, and I have also discussed the role of dental appliance in treatment of moderate osa.  If he wishes to continue working with cpap, would get him referred to sleep center for a mask fit and desensitization.  Would also get him changed over to bilevel as a trial, which he may tolerate better.  The pt would like to take a few days to think about.

## 2010-12-12 NOTE — Progress Notes (Signed)
  Subjective:    Patient ID: Derrick Sanchez, male    DOB: September 10, 1930, 77 y.o.   MRN: [id number]  HPI The pt comes in today for f/u of his known moderate osa.  He has been unable to wear cpap consistently due to mask leaks, and feeling of claustrophobia from the pressure.  His wife is concerned because of his severe daytime sleepiness, and states that he can fall asleep eating.     Review of Systems  Constitutional: Negative for fever and unexpected weight change.  HENT: Positive for congestion. Negative for ear pain, nosebleeds, sore throat, rhinorrhea, sneezing, trouble swallowing, dental problem, postnasal drip and sinus pressure.   Eyes: Negative for redness and itching.  Respiratory: Negative for cough, chest tightness, shortness of breath and wheezing.   Cardiovascular: Negative for palpitations and leg swelling.  Gastrointestinal: Negative for nausea and vomiting.  Genitourinary: Negative for dysuria.  Musculoskeletal: Positive for joint swelling.  Skin: Negative for rash.  Neurological: Negative for headaches.  Hematological: Does not bruise/bleed easily.  Psychiatric/Behavioral: Negative for dysphoric mood. The patient is not nervous/anxious.        Objective:   Physical Exam Wd male in nad No skin breakdown or pressure necrosis from cpap mask LE without edema, no cyanosis noted. Appears sleepy, moves all 4.       Assessment & Plan:

## 2010-12-12 NOTE — Patient Instructions (Signed)
Please review the handout on dental appliances, and see if this is something you wish to consider. If you decide to try the cpap again, would get you a mask fitting at sleep center and change you over to bipap. Please call me next week and let me know what you decide.

## 2010-12-13 ENCOUNTER — Other Ambulatory Visit: Payer: Self-pay | Admitting: Pulmonary Disease

## 2010-12-13 ENCOUNTER — Telehealth: Payer: Self-pay | Admitting: Pulmonary Disease

## 2010-12-13 DIAGNOSIS — G4733 Obstructive sleep apnea (adult) (pediatric): Secondary | ICD-10-CM

## 2010-12-13 NOTE — Telephone Encounter (Signed)
KC, please advise of mouthpiece and I can place the order for pt. Thanks.

## 2010-12-13 NOTE — Telephone Encounter (Signed)
Let him know that we will get him referred to [facility] Myrtis Ser, to evaluate him for a dental appliance Order sent to [facility]

## 2010-12-16 NOTE — Telephone Encounter (Signed)
Pt aware referral has been sent and someone will contact him regarding this appt.

## 2010-12-16 NOTE — Telephone Encounter (Signed)
PATIENT RETURNED CALL.  HE WAS GIVEN INFORMATION REGARDING REFERRAL TO DR MARK KATZ AND IS LOOKING FORWARD TO HEARING FROM [facility]

## 2010-12-16 NOTE — Telephone Encounter (Signed)
LMOMTCB

## 2011-01-14 ENCOUNTER — Telehealth: Payer: Self-pay | Admitting: Pulmonary Disease

## 2011-01-14 NOTE — Telephone Encounter (Signed)
Let him know that tongue retaining devices are a type of dental appliance.  Dr. Myrtis Ser does an exam and makes choices on types of devices based on pt's anatomy and his experience.  If this is what he recommended, would start with this and see how it does.

## 2011-01-14 NOTE — Telephone Encounter (Signed)
Spoke with pt and he states saw Dr Myrtis Ser last wk and rather than rec a mouth piece for OSA, he rec a tongue piece for him to try. He has not tried this yet b/c when he got home with the box he opened it and there was nothing there. He has had Dr Myrtis Ser order him a new one, and in the meantime wants to know KC's thought regarding tongue piece. Pls advise, thanks!

## 2011-01-15 NOTE — Telephone Encounter (Signed)
Spoke with pt and notified of recs per [facility] and he verbalized understanding and denied any questions.

## 2011-01-27 ENCOUNTER — Encounter: Payer: Self-pay | Admitting: Pulmonary Disease

## 2011-01-27 ENCOUNTER — Ambulatory Visit (INDEPENDENT_AMBULATORY_CARE_PROVIDER_SITE_OTHER): Payer: Medicare Other | Admitting: Pulmonary Disease

## 2011-01-27 VITALS — BP 140/88 | HR 70 | Temp 97.6°F | Ht 72.0 in | Wt 180.4 lb

## 2011-01-27 DIAGNOSIS — G4733 Obstructive sleep apnea (adult) (pediatric): Secondary | ICD-10-CM

## 2011-01-27 NOTE — Progress Notes (Signed)
  Subjective:    Patient ID: Derrick Sanchez, male    DOB: Nov 25, 1930, 77 y.o.   MRN: [id number]  HPI The patient comes in today for followup of his known moderate sleep apnea.  In the last visit he was referred for dental appliance, but unfortunately did not have enough permanent teeth to be able to have an appliance.  He was fitted for a tongue retaining device, but it has yet been received.  He is concerned this may not work for him, andwanted to discuss other alternatives.   Review of Systems  Constitutional: Negative for fever and unexpected weight change.  HENT: Negative for ear pain, nosebleeds, congestion, sore throat, rhinorrhea, sneezing, trouble swallowing, dental problem, postnasal drip and sinus pressure.   Eyes: Negative for redness and itching.  Respiratory: Negative for cough, chest tightness, shortness of breath and wheezing.   Cardiovascular: Positive for leg swelling. Negative for palpitations.  Gastrointestinal: Negative for nausea and vomiting.  Genitourinary: Negative for dysuria.  Musculoskeletal: Negative for joint swelling.  Skin: Negative for rash.  Neurological: Negative for headaches.  Hematological: Does not bruise/bleed easily.  Psychiatric/Behavioral: Negative for dysphoric mood. The patient is not nervous/anxious.        Objective:   Physical Exam Well-developed male in no acute distress Nose without obvious discharge or purulence Lower extremities without edema, no cyanosis noted Alert and oriented, moves all 4 extremities.       Assessment & Plan:

## 2011-01-27 NOTE — Assessment & Plan Note (Signed)
The patient has known moderate obstructive sleep apnea and has been somewhat intolerant to CPAP.  He wished to try a dental appliance, however did not have enough permanent teeth to do so.  He's been fitted for a tongue retaining device, and is willing to give this a try.  If this does not work for him, would consider ENT evaluation for upper airway exam.  He did also consider trying CPAP again with nasal pillows and a chinstrap.

## 2011-01-27 NOTE — Patient Instructions (Signed)
Try the tongue retaining device.  If you do not see improvement, will refer to ENT for upper airway evaluation Work on modest weight loss Give me feedback on your progress.

## 2011-02-28 ENCOUNTER — Telehealth: Payer: Self-pay | Admitting: Pulmonary Disease

## 2011-02-28 NOTE — Telephone Encounter (Signed)
Pt calling to let [facility] know he would like to try cpap again using nasal pillows this time. He says he is breathing better through his nose and wants to try it. He does not want to go through any surgery. Pls advise.

## 2011-02-28 NOTE — Telephone Encounter (Signed)
He turned his machine back in.

## 2011-02-28 NOTE — Telephone Encounter (Signed)
Does he still have his cpap machine, or did he turn back in?

## 2011-03-03 ENCOUNTER — Other Ambulatory Visit: Payer: Self-pay | Admitting: Pulmonary Disease

## 2011-03-03 DIAGNOSIS — G4733 Obstructive sleep apnea (adult) (pediatric): Secondary | ICD-10-CM

## 2011-03-03 NOTE — Telephone Encounter (Signed)
Spoke with pt's spouse. She was already contacted by [facility] so aware order for CPAP sent. I have scheduled pt for 4 wk f/u with KC for 04/03/11 at 9:15 am.

## 2011-03-03 NOTE — Telephone Encounter (Signed)
Let him know we will order new machine for him. He needs followup with me in 4 weeks.

## 2011-03-25 LAB — BASIC METABOLIC PANEL
BUN: 23
CO2: 22
Calcium: 8.5
Chloride: 106
Creatinine, Ser: 1.32
GFR calc Af Amer: >60
Sodium: 135

## 2011-03-25 LAB — CBC
Hemoglobin: 10.2 — ABNORMAL LOW
RBC: 3.35 — ABNORMAL LOW

## 2011-03-25 LAB — PROTIME-INR: INR: 1.5

## 2011-03-26 LAB — BASIC METABOLIC PANEL
Calcium: 8.3 — ABNORMAL LOW
Chloride: 107
GFR calc Af Amer: >60
GFR calc Af Amer: >60
GFR calc non Af Amer: 56 — ABNORMAL LOW
GFR calc non Af Amer: >60
Glucose, Bld: 121 — ABNORMAL HIGH
Potassium: 3.8
Potassium: 3.8
Sodium: 137
Sodium: 138

## 2011-03-26 LAB — CBC
HCT: 23.4 — ABNORMAL LOW
HCT: 29.7 — ABNORMAL LOW
Hemoglobin: 7.8 — CL
MCV: 89.8
Platelets: 175
Platelets: 273
RBC: 2.58 — ABNORMAL LOW
RBC: 3.31 — ABNORMAL LOW
WBC: 7.2
WBC: 7.6
WBC: 8.4

## 2011-03-26 LAB — PREPARE RBC (CROSSMATCH)

## 2011-03-26 LAB — URINALYSIS, ROUTINE W REFLEX MICROSCOPIC
Bilirubin Urine: NEGATIVE
Nitrite: NEGATIVE
Specific Gravity, Urine: 1.022
pH: 7

## 2011-03-26 LAB — TYPE AND SCREEN
ABO/RH(D): O NEG
Antibody Screen: NEGATIVE

## 2011-03-26 LAB — COMPREHENSIVE METABOLIC PANEL
AST: 22
Albumin: 3.5
Alkaline Phosphatase: 101
Chloride: 109
Creatinine, Ser: 1.16
GFR calc Af Amer: >60
Potassium: 4.8
Total Bilirubin: 0.5
Total Protein: 6.9

## 2011-03-26 LAB — APTT: aPTT: 31

## 2011-03-26 LAB — ABO/RH: ABO/RH(D): O NEG

## 2011-03-26 LAB — PROTIME-INR: INR: 1.2

## 2011-03-31 LAB — URINALYSIS, ROUTINE W REFLEX MICROSCOPIC
Glucose, UA: NEGATIVE
Hgb urine dipstick: NEGATIVE
pH: 6.5

## 2011-03-31 LAB — CALCIUM
Calcium: 8.8
Calcium: 9.5

## 2011-03-31 LAB — BASIC METABOLIC PANEL
CO2: 23
Calcium: 10.2
GFR calc Af Amer: >60
GFR calc non Af Amer: 50 — ABNORMAL LOW
Glucose, Bld: 134 — ABNORMAL HIGH
Potassium: 4.5
Sodium: 136

## 2011-03-31 LAB — CBC
HCT: 34.1 — ABNORMAL LOW
Hemoglobin: 11.4 — ABNORMAL LOW
MCHC: 33.3
RBC: 3.53 — ABNORMAL LOW
RDW: 13.1

## 2011-03-31 LAB — DIFFERENTIAL
Eosinophils Relative: 0
Lymphocytes Relative: 12
Monocytes Absolute: 0.4
Monocytes Relative: 4
Neutro Abs: 7.8 — ABNORMAL HIGH

## 2011-04-03 ENCOUNTER — Encounter: Payer: Self-pay | Admitting: Pulmonary Disease

## 2011-04-03 ENCOUNTER — Ambulatory Visit (INDEPENDENT_AMBULATORY_CARE_PROVIDER_SITE_OTHER): Payer: Medicare Other | Admitting: Pulmonary Disease

## 2011-04-03 VITALS — BP 160/78 | HR 76 | Temp 97.7°F | Ht 74.0 in | Wt 181.4 lb

## 2011-04-03 DIAGNOSIS — G4733 Obstructive sleep apnea (adult) (pediatric): Secondary | ICD-10-CM

## 2011-04-03 NOTE — Patient Instructions (Signed)
Will change cpap to automatic mode for next 2 weeks to optimize your pressure.  Will call with results. followup with me in 6mos, but please call if you continue to have issues with frequent mask removal during the night.

## 2011-04-03 NOTE — Assessment & Plan Note (Signed)
The patient was unable to tolerate a tongue retaining device through dental medicine, and was started on CPAP as a trial.  He is using nasal pillows, and feels that CPAP may be a viable option for him.  He is pulling the mask off some at night, but it is starting to improve.  When he is able to wear the device for most of the night, he sees significant improvement in his sleep and daytime alertness. Care Plan:  At this point, will arrange for the patient's machine to be changed over to auto mode for 2 weeks to optimize their pressure.  I will review the downloaded data once sent by dme, and also evaluate for compliance, leaks, and residual osa.  I will call the patient and dme to discuss the results, and have the patient's machine set appropriately.  This will serve as the pt's cpap pressure titration.

## 2011-04-03 NOTE — Progress Notes (Signed)
  Subjective:    Patient ID: Derrick Sanchez, male    DOB: 11-Dec-1930, 77 y.o.   MRN: [id number]  HPI The patient comes in today for followup of his known sleep apnea.  He is wearing CPAP compliantly, but does have some issues at times with mask removal during the night.  When he is able to wear more consistently, he sees significant improvement in his sleep and daytime alertness.  He is having no issues with pressure tolerance.  He does not feel that he is opening his mouth a lot with the nasal pillows.   Review of Systems  Constitutional: Negative for fever and unexpected weight change.  HENT: Negative for ear pain, nosebleeds, congestion, sore throat, rhinorrhea, sneezing, trouble swallowing, dental problem, postnasal drip and sinus pressure.   Eyes: Negative for redness and itching.  Respiratory: Negative for cough, chest tightness, shortness of breath and wheezing.   Cardiovascular: Positive for leg swelling. Negative for palpitations.  Gastrointestinal: Negative for nausea and vomiting.  Genitourinary: Negative for dysuria.  Musculoskeletal: Negative for joint swelling.  Skin: Negative for rash.  Neurological: Negative for headaches.  Hematological: Does not bruise/bleed easily.  Psychiatric/Behavioral: Negative for dysphoric mood. The patient is not nervous/anxious.        Objective:   Physical Exam Well-developed male in no acute distress No skin breakdown or pressure necrosis from the CPAP mask Lower extremities without edema, no cyanosis noted Alert and oriented, moves all 4 extremities.       Assessment & Plan:

## 2011-04-04 ENCOUNTER — Telehealth: Payer: Self-pay | Admitting: Pulmonary Disease

## 2011-04-04 NOTE — Telephone Encounter (Signed)
Called and spoke with drs medical and they stated that the order they received was to increase the cpap for 2 wks.  They stated that the pt will not be done with his 30 day download until 10-25--they do not think that his insurance will cover the 2 wk download unless the 30 day is completed first.  They will fax over his 30 day download once completed and we will need to let them know when to start on the 2 week download.  They are aware that [facility] out of the office until 10-29 so i will forward this message to [facility] to make him aware.

## 2011-04-14 NOTE — Telephone Encounter (Signed)
Attempted to call leandra to make her aware that pt will need to be on auto NOW for the next 2 weeks.  No answer.  Will try back tomorrow to make them aware.

## 2011-04-14 NOTE — Telephone Encounter (Signed)
The cpap was ordered on 03/03/11 for the pt, and he should be well out from his 30d compliance.  It doesn;t matter anyway.  30 days is 30 days, whether he is on set pressure, auto, or any other kind of pressure setting.  We are just looking at how much pt is using device.  Tell them I want his machine on auto NOW for the next 2 weeks!!!

## 2011-04-15 NOTE — Telephone Encounter (Signed)
Derrick Sanchez will be faxing over compliance report so Dr. Shelle Iron may review. She wants him to review this and if the pt still needs to be set up on auto they will do so. I will forward this to Aundra Millet so she can watch for this report.

## 2011-04-16 NOTE — Telephone Encounter (Signed)
Received fax and put in [facility] very important look at folder for him to review.

## 2011-04-16 NOTE — Telephone Encounter (Signed)
Pt needs to be changed over to auto as ordered.   Also let pt know that it is very important that he wear device all night while we are trying to find his best pressure.  Let us know if having issues with wearing consistently

## 2011-04-17 NOTE — Telephone Encounter (Signed)
[email or web address]  

## 2011-04-18 NOTE — Telephone Encounter (Signed)
Spoke with [place] and notified of recs per [facility]. She verbalized understanding and states nothing further needed.

## 2011-05-25 ENCOUNTER — Telehealth: Payer: Self-pay | Admitting: Pulmonary Disease

## 2011-05-25 NOTE — Telephone Encounter (Signed)
Pt with poor compliance on cpap Derrick Sanchez, pt needs ov to discuss issues with cpap and how we can improve things.

## 2011-05-28 NOTE — Telephone Encounter (Signed)
LMOM for pt TCB 

## 2011-05-28 NOTE — Telephone Encounter (Signed)
Made appt for pt 12/13.  Derrick Sanchez

## 2011-05-29 ENCOUNTER — Encounter: Payer: Self-pay | Admitting: Pulmonary Disease

## 2011-05-29 ENCOUNTER — Ambulatory Visit (INDEPENDENT_AMBULATORY_CARE_PROVIDER_SITE_OTHER): Payer: Medicare Other | Admitting: Pulmonary Disease

## 2011-05-29 VITALS — BP 150/74 | HR 65 | Temp 97.3°F | Ht 74.0 in | Wt 188.2 lb

## 2011-05-29 DIAGNOSIS — G4733 Obstructive sleep apnea (adult) (pediatric): Secondary | ICD-10-CM

## 2011-05-29 MED ORDER — TRAZODONE HCL 50 MG PO TABS: 50.0000 mg | ORAL_TABLET | Freq: Every day | ORAL | Status: DC

## 2011-05-29 NOTE — Patient Instructions (Signed)
Will try adding chin strap to your nasal pillows Will try trazodone 50mg  one each night a little bit before bedtime to see if it will help with cpap tolerance Try putting socks on hands at bedtime to see if it helps Please give me feedback in 3 weeks with how things are going.

## 2011-05-29 NOTE — Assessment & Plan Note (Signed)
The patient continues to have issues with treatment of his obstructive sleep apnea.  He is having poor tolerance of CPAP primarily because of unknowingly pulling the mask off during the night.  He is willing to continue working with CPAP.  I have asked him to try and put socks over his hands at bedtime, and we'll also try him on a sedative hypnotic to see if that is helpful.  The patient will call me after he has done this to give feedback.

## 2011-05-29 NOTE — Progress Notes (Signed)
  Subjective:    Patient ID: Derrick Sanchez, male    DOB: 1930-08-29, 77 y.o.   MRN: [id number]  HPI The patient comes in today for followup of his obstructive sleep apnea.  He was not able to tolerate a dental appliance, and therefore was given a CPAP trial.  He has been on nasal pillows, but frequently awakens during the night and has been pulled off his face.  He does this unknowingly.  We have received a download for the patient while on an auto set, and it shows a daily usage of only 1-1/2 hours.  He also had a very large mask leak.  The patient is willing to continue trying CPAP.   Review of Systems  Constitutional: Negative for fever and unexpected weight change.  HENT: Negative for ear pain, nosebleeds, congestion, sore throat, rhinorrhea, sneezing, trouble swallowing, dental problem, postnasal drip and sinus pressure.   Eyes: Negative for redness and itching.  Respiratory: Negative for cough, chest tightness, shortness of breath and wheezing.   Cardiovascular: Negative for palpitations and leg swelling.  Gastrointestinal: Negative for nausea and vomiting.  Genitourinary: Negative for dysuria.  Musculoskeletal: Negative for joint swelling.  Skin: Negative for rash.  Neurological: Negative for headaches.  Hematological: Does not bruise/bleed easily.  Psychiatric/Behavioral: Negative for dysphoric mood. The patient is not nervous/anxious.        Objective:   Physical Exam Well-developed male in no acute distress no skin breakdown or pressure necrosis from the CPAP mask Lower extremities without edema, no cyanosis noted Alert, but does appear somewhat sleepy, moves all 4 extremities.       Assessment & Plan:

## 2011-06-19 ENCOUNTER — Telehealth: Payer: Self-pay | Admitting: Pulmonary Disease

## 2011-06-19 NOTE — Telephone Encounter (Signed)
I spoke with pt and was calling to give update on his cpap. Pt states he "hates" it. He states during the night he pulls it off w/o knowing it (usually couple hrs after he puts it on). Pt is wanting to get rid of it. Pt states the trazadone helps him sleep at night and feels like that helps him more than the cpap. Please advise Dr. Shelle Iron, thanks

## 2011-06-23 ENCOUNTER — Telehealth: Payer: Self-pay | Admitting: Pulmonary Disease

## 2011-06-23 NOTE — Telephone Encounter (Signed)
Called spoke with patient who is requesting a refill on his trazodone 50mg  and feels this helps more than the CPAP.  Was given the rx 12.13.12 at ov.    Dr Shelle Iron please advise, thanks.

## 2011-06-24 MED ORDER — TRAZODONE HCL 50 MG PO TABS: 50.0000 mg | ORAL_TABLET | Freq: Every evening | ORAL | Status: AC | PRN

## 2011-06-24 NOTE — Telephone Encounter (Signed)
See phone note dated 06/23/11- pt willing to try CPAP for a couple more wks with trazodone and understands that the trazodone is not a substitute for CPAP. He is willing to try CPAP and is to call back if still having issues. I had already called in the trazodone for him.

## 2011-06-24 NOTE — Telephone Encounter (Signed)
Let pt know it is very important that he understands the trazodone is not a substitute for cpap.  In fact, trazodone can WORSEN his sleep apnea.  He cannot take trazodone and not wear cpap, not good for him medically.  I don't mind extending the trazodone another 2 weeks while he is trying to get adapted to cpap, but would not want to continue longterm. Trazodone 50mg  one as needed at HS ( #15, no fills).  If he is still not able to wear cpap, he needs to let me know so we can discuss further or look at other options?

## 2011-06-24 NOTE — Telephone Encounter (Signed)
Please see my other message.  Need to do better job of sorting out duplicates. If he feels he can't wear cpap, would d/c and obviously not call in trazodone. We have tried cpap and dental appliance.  Would he consider ENT referral for surgical evaluation?

## 2011-06-24 NOTE — Telephone Encounter (Signed)
Spoke with pt and notified of recs per [facility]. Pt verbalized understanding and rx was called to pharm #15 tablet with 0 rf.

## 2011-10-02 ENCOUNTER — Ambulatory Visit: Payer: Medicare Other | Admitting: Pulmonary Disease

## 2011-10-09 ENCOUNTER — Other Ambulatory Visit: Payer: Self-pay | Admitting: Orthopedic Surgery

## 2011-10-09 ENCOUNTER — Encounter (HOSPITAL_BASED_OUTPATIENT_CLINIC_OR_DEPARTMENT_OTHER): Payer: Self-pay | Admitting: *Deleted

## 2011-10-09 NOTE — Progress Notes (Signed)
No labs needed

## 2011-10-14 ENCOUNTER — Ambulatory Visit (HOSPITAL_BASED_OUTPATIENT_CLINIC_OR_DEPARTMENT_OTHER): Payer: Medicare Other | Admitting: Anesthesiology

## 2011-10-14 ENCOUNTER — Encounter (HOSPITAL_BASED_OUTPATIENT_CLINIC_OR_DEPARTMENT_OTHER): Payer: Self-pay | Admitting: Orthopedic Surgery

## 2011-10-14 ENCOUNTER — Encounter (HOSPITAL_BASED_OUTPATIENT_CLINIC_OR_DEPARTMENT_OTHER)
Admission: RE | Disposition: A | Discharge status: Home / Self Care | Payer: Self-pay | Source: Ambulatory Visit | Attending: Orthopedic Surgery

## 2011-10-14 ENCOUNTER — Encounter (HOSPITAL_BASED_OUTPATIENT_CLINIC_OR_DEPARTMENT_OTHER): Payer: Self-pay | Admitting: Anesthesiology

## 2011-10-14 ENCOUNTER — Ambulatory Visit (HOSPITAL_BASED_OUTPATIENT_CLINIC_OR_DEPARTMENT_OTHER)
Admission: RE | Admit: 2011-10-14 | Discharge: 2011-10-14 | Disposition: A | Discharge status: Home / Self Care | Payer: Medicare Other | Source: Ambulatory Visit | Attending: Orthopedic Surgery | Admitting: Orthopedic Surgery

## 2011-10-14 DIAGNOSIS — G4730 Sleep apnea, unspecified: Secondary | ICD-10-CM | POA: Insufficient documentation

## 2011-10-14 DIAGNOSIS — J45909 Unspecified asthma, uncomplicated: Secondary | ICD-10-CM | POA: Insufficient documentation

## 2011-10-14 DIAGNOSIS — L989 Disorder of the skin and subcutaneous tissue, unspecified: Secondary | ICD-10-CM | POA: Insufficient documentation

## 2011-10-14 HISTORY — PX: MASS EXCISION: SHX2000

## 2011-10-14 LAB — POCT HEMOGLOBIN-HEMACUE: Hemoglobin: 11.5 g/dL — ABNORMAL LOW (ref 13.0–17.0)

## 2011-10-14 SURGERY — EXCISION MASS
Anesthesia: Monitor Anesthesia Care | Site: Hand | Laterality: Left | Wound class: Clean

## 2011-10-14 MED ORDER — CHLORHEXIDINE GLUCONATE 4 % EX LIQD: 60.0000 mL | Freq: Once | CUTANEOUS | Status: DC

## 2011-10-14 MED ORDER — LIDOCAINE HCL (CARDIAC) 20 MG/ML IV SOLN
INTRAVENOUS | Status: DC | PRN
  Administered 2011-10-14: 60 mg via INTRAVENOUS

## 2011-10-14 MED ORDER — LIDOCAINE HCL (PF) 0.5 % IJ SOLN
INTRAMUSCULAR | Status: DC | PRN
  Administered 2011-10-14: 30 mL via INTRATHECAL

## 2011-10-14 MED ORDER — HYDROCODONE-ACETAMINOPHEN 5-500 MG PO TABS: 1.0000 | ORAL_TABLET | ORAL | Status: AC | PRN

## 2011-10-14 MED ORDER — BUPIVACAINE HCL (PF) 0.25 % IJ SOLN
INTRAMUSCULAR | Status: DC | PRN
  Administered 2011-10-14: 4 mL

## 2011-10-14 MED ORDER — HYDROMORPHONE HCL PF 1 MG/ML IJ SOLN: 0.2500 mg | INTRAMUSCULAR | Status: DC | PRN

## 2011-10-14 MED ORDER — LACTATED RINGERS IV SOLN
INTRAVENOUS | Status: DC
  Administered 2011-10-14 (×2): via INTRAVENOUS

## 2011-10-14 MED ORDER — DROPERIDOL 2.5 MG/ML IJ SOLN: 0.6250 mg | INTRAMUSCULAR | Status: DC | PRN

## 2011-10-14 MED ORDER — FENTANYL CITRATE 0.05 MG/ML IJ SOLN
INTRAMUSCULAR | Status: DC | PRN
  Administered 2011-10-14: 50 ug via INTRAVENOUS

## 2011-10-14 MED ORDER — PROPOFOL 10 MG/ML IV EMUL
INTRAVENOUS | Status: DC | PRN
  Administered 2011-10-14: 75 ug/kg/min via INTRAVENOUS

## 2011-10-14 MED ORDER — ONDANSETRON HCL 4 MG/2ML IJ SOLN
INTRAMUSCULAR | Status: DC | PRN
  Administered 2011-10-14: 4 mg via INTRAVENOUS

## 2011-10-14 SURGICAL SUPPLY — 50 items
BANDAGE COBAN STERILE 2 (GAUZE/BANDAGES/DRESSINGS) IMPLANT
BANDAGE GAUZE ELAST BULKY 4 IN (GAUZE/BANDAGES/DRESSINGS) ×1 IMPLANT
BLADE MINI RND TIP GREEN BEAV (BLADE) IMPLANT
BLADE SURG 15 STRL LF DISP TIS (BLADE) ×1 IMPLANT
BLADE SURG 15 STRL SS (BLADE) ×2
BNDG CMPR 9X4 STRL LF SNTH (GAUZE/BANDAGES/DRESSINGS) ×1
BNDG COHESIVE 1X5 TAN STRL LF (GAUZE/BANDAGES/DRESSINGS) IMPLANT
BNDG COHESIVE 3X5 TAN STRL LF (GAUZE/BANDAGES/DRESSINGS) IMPLANT
BNDG ESMARK 4X9 LF (GAUZE/BANDAGES/DRESSINGS) ×1 IMPLANT
CHLORAPREP W/TINT 26ML (MISCELLANEOUS) ×2 IMPLANT
CLOTH BEACON ORANGE TIMEOUT ST (SAFETY) ×2 IMPLANT
CORDS BIPOLAR (ELECTRODE) ×2 IMPLANT
COVER MAYO STAND STRL (DRAPES) ×2 IMPLANT
COVER TABLE BACK 60X90 (DRAPES) ×2 IMPLANT
CUFF TOURNIQUET SINGLE 18IN (TOURNIQUET CUFF) ×1 IMPLANT
DECANTER SPIKE VIAL GLASS SM (MISCELLANEOUS) IMPLANT
DRAIN PENROSE 1/2X12 LTX STRL (WOUND CARE) IMPLANT
DRAPE EXTREMITY T 121X128X90 (DRAPE) ×2 IMPLANT
DRAPE SURG 17X23 STRL (DRAPES) ×2 IMPLANT
GAUZE XEROFORM 1X8 LF (GAUZE/BANDAGES/DRESSINGS) ×2 IMPLANT
GLOVE BIO SURGEON STRL SZ 6.5 (GLOVE) ×2 IMPLANT
GLOVE BIO SURGEON STRL SZ7.5 (GLOVE) ×1 IMPLANT
GLOVE BIOGEL PI IND STRL 8 (GLOVE) IMPLANT
GLOVE BIOGEL PI INDICATOR 8 (GLOVE) ×1
GLOVE SURG ORTHO 8.0 STRL STRW (GLOVE) ×2 IMPLANT
GOWN BRE IMP PREV XXLGXLNG (GOWN DISPOSABLE) ×3 IMPLANT
GOWN PREVENTION PLUS XLARGE (GOWN DISPOSABLE) ×2 IMPLANT
NDL SAFETY ECLIPSE 18X1.5 (NEEDLE) ×1 IMPLANT
NEEDLE 27GAX1X1/2 (NEEDLE) ×1 IMPLANT
NEEDLE HYPO 18GX1.5 SHARP (NEEDLE) ×2
NS IRRIG 1000ML POUR BTL (IV SOLUTION) ×2 IMPLANT
PACK BASIN DAY SURGERY FS (CUSTOM PROCEDURE TRAY) ×2 IMPLANT
PAD CAST 3X4 CTTN HI CHSV (CAST SUPPLIES) IMPLANT
PADDING CAST ABS 3INX4YD NS (CAST SUPPLIES)
PADDING CAST ABS 4INX4YD NS (CAST SUPPLIES) ×1
PADDING CAST ABS COTTON 3X4 (CAST SUPPLIES) IMPLANT
PADDING CAST ABS COTTON 4X4 ST (CAST SUPPLIES) ×1 IMPLANT
PADDING CAST COTTON 3X4 STRL (CAST SUPPLIES) ×2
SPLINT PLASTER CAST XFAST 3X15 (CAST SUPPLIES) IMPLANT
SPLINT PLASTER XTRA FASTSET 3X (CAST SUPPLIES) ×10
SPONGE GAUZE 4X4 12PLY (GAUZE/BANDAGES/DRESSINGS) ×2 IMPLANT
STOCKINETTE 4X48 STRL (DRAPES) ×2 IMPLANT
SUT VIC AB 4-0 P2 18 (SUTURE) IMPLANT
SUT VICRYL RAPID 5 0 P 3 (SUTURE) IMPLANT
SUT VICRYL RAPIDE 4/0 PS 2 (SUTURE) ×2 IMPLANT
SYR BULB 3OZ (MISCELLANEOUS) ×2 IMPLANT
SYR CONTROL 10ML LL (SYRINGE) ×1 IMPLANT
TOWEL OR 17X24 6PK STRL BLUE (TOWEL DISPOSABLE) ×4 IMPLANT
UNDERPAD 30X30 INCONTINENT (UNDERPADS AND DIAPERS) ×2 IMPLANT
WATER STERILE IRR 1000ML POUR (IV SOLUTION) ×2 IMPLANT

## 2011-10-14 NOTE — Op Note (Signed)
Derrick Sanchez, Derrick Sanchez                ACCOUNT NO.:  [id number]  MEDICAL RECORD NO.:  [id number]  LOCATION:                                 FACILITY:  PHYSICIAN:  Cindee Salt, M.D.            DATE OF BIRTH:  DATE OF PROCEDURE:  10/14/2011 DATE OF DISCHARGE:                              OPERATIVE REPORT   PREOPERATIVE DIAGNOSIS:  Mass, dorsal aspect, metacarpophalangeal joint, left middle finger.  POSTOPERATIVE DIAGNOSIS:  Mass, dorsal aspect, metacarpophalangeal joint, left middle finger.  OPERATION:  Excisional biopsy mass, left hand.  SURGEON:  Cindee Salt, M.D.  ASSISTANT:  Betha Loa, MD  ANESTHESIA:  Forearm-based IV regional with local infiltration.  ANESTHESIOLOGIST:  Quita Skye. Krista Blue, M.D.  HISTORY:  The patient is a 78 year old male with a history of an enlarging mass over the metacarpophalangeal joint of his left middle finger.  He recalls no history of injury or trauma.  He is desirous of having this removed, it does not transilluminate.  Pre, peri, postoperative course had been discussed along with risks and complications.  He is aware that there is no guarantee with the surgery, possibility of infection, recurrence, injury to arteries, nerves, tendons, complete relief of symptoms, dystrophy.  Preoperative area, the patient was seen, the extremity marked by both the patient and surgeon. Antibiotic given.  PROCEDURE:  The patient was brought to the operating room where a forearm-based IV regional anesthetic was carried out without difficulty. He was prepped using ChloraPrep, supine position with the left arm free. A 3 minutes dry time was allowed.  Time-out taken, confirming the patient, procedure.  A longitudinal incision was made over the metacarpophalangeal joint mass carried down through subcutaneous tissue. A blood filled saccular structure was immediately encountered.  This was dissected free from the extensor tendon, it was found to be well demarcated.  The  entire specimen was sent to pathology.  No further lesions were identified.  The wound was irrigated.  The skin then closed with interrupted 4-0 Vicryl Rapide sutures.  Local infiltration with 0.25% Marcaine without epinephrine, 3.5 mL was used.  The wound was then closed with interrupted 4-0 Vicryl Rapide.  A sterile compressive dressing with splint to the wrist, fingers with metacarpophalangeal joints was applied.  DIP and PIP joints were free.  Deflation of the tourniquet, all fingers immediately pinked.  He was taken to the recovery room for observation in satisfactory condition.  He will be discharged home to return to [facility] in 1 week, on Vicodin.          ______________________________ Cindee Salt, M.D.     GK/MEDQ  D:  10/14/2011  T:  10/14/2011  Job:  [id number]

## 2011-10-14 NOTE — Anesthesia Preprocedure Evaluation (Signed)
Anesthesia Evaluation  Patient identified by MRN, date of birth, ID band Patient awake    Reviewed: Allergy & Precautions, H&P , NPO status , Patient's Chart, lab work & pertinent test results  Airway Mallampati: II TM Distance: >3 FB Neck ROM: full    Dental  (+) Teeth Intact and Dental Advidsory Given   Pulmonary asthma , sleep apnea ,  breath sounds clear to auscultation        Cardiovascular negative cardio ROS  Rhythm:regular Rate:Normal     Neuro/Psych    GI/Hepatic Neg liver ROS, Controlled and Medicated,  Endo/Other  negative endocrine ROS  Renal/GU negative Renal ROS  negative genitourinary   Musculoskeletal   Abdominal Normal abdominal exam  (+)   Peds  Hematology   Anesthesia Other Findings   Reproductive/Obstetrics                           Anesthesia Physical Anesthesia Plan  ASA: III  Anesthesia Plan: Bier Block and MAC   Post-op Pain Management:    Induction:   Airway Management Planned:   Additional Equipment:   Intra-op Plan:   Post-operative Plan:   Informed Consent: I have reviewed the patients History and Physical, chart, labs and discussed the procedure including the risks, benefits and alternatives for the proposed anesthesia with the patient or authorized representative who has indicated his/her understanding and acceptance.   Dental Advisory Given  Plan Discussed with: Anesthesiologist, CRNA and Surgeon  Anesthesia Plan Comments:         Anesthesia Quick Evaluation

## 2011-10-14 NOTE — Op Note (Signed)
Dictated number: [id number]

## 2011-10-14 NOTE — Op Note (Signed)

## 2011-10-14 NOTE — Anesthesia Postprocedure Evaluation (Signed)
Anesthesia Post Note  Patient: Derrick Sanchez  Procedure(s) Performed: Procedure(s) (LRB): EXCISION MASS (Left)  Anesthesia type: MAC  Patient location: PACU  Post pain: Pain level controlled  Post assessment: Patient's Cardiovascular Status Stable  Last Vitals:  Filed Vitals:   10/14/11 1156  BP: 158/71  Pulse: 68  Temp: 36.7 C  Resp: 16    Post vital signs: Reviewed and stable  Level of consciousness: sedated  Complications: No apparent anesthesia complications

## 2011-10-14 NOTE — H&P (Signed)
Derrick Sanchez is a 78 year-old right-hand dominant male referred by Dr. Ouida Sills for consultation with respect to a mass on the dorsal aspect of his left hand.  It has been present for approximately one week.  He recalls no history of injury.  It is not causing him a great deal of pain or discomfort.  He has prior history of fusion to the DIP joint left little finger for what sounds like a mucoid cyst and degenerative arthritis. He has history of arthritis, no history of diabetes, thyroid disease or gout.  He complains of an intermittent severe throbbing pain with a feeling of swelling, occasional numbness.  He states it is getting worse.  He has a prior history of fracture to that wrist.  He states sitting a lot makes this worse, walking makes it better.    ALLERGIES:   None.  MEDICATIONS:    Mirapex twice a day, Azopt, Alphagan and Lumigan.  SURGICAL HISTORY:    Right arm 1995, right hip replacement 2008, and left knee replacement in 2009.   FAMILY MEDICAL HISTORY:   Positive for heart disease and arthritis.  SOCIAL HISTORY:   He does not smoke or drink.  He is married and retired.    REVIEW OF SYSTEMS:    Positive for glasses, asthma, sleep disorder, otherwise  Derrick Sanchez is an 78 y.o. male.   Chief Complaint: mass lt hand HPI: see above  Past Medical History  Diagnosis Date  . Obstructive sleep apnea (adult) (pediatric)   . Unspecified asthma   . Unspecified glaucoma   . Hyperparathyroidism, unspecified   . Benign neoplasm of colon   . Other vitamin B12 deficiency anemia   . Restless legs syndrome (RLS)   . Esophageal reflux   . Diverticulosis of colon (without mention of hemorrhage)   . Glaucoma     Past Surgical History  Procedure Date  . Parathyroidectomy   . Total knee arthroplasty     lt  . Hip surgery     rt  . Colonoscopy   . Eye surgery     both cataracts    Family History  Problem Relation Age of Onset  . Brain cancer      3 brothers  . Heart disease  Father   . Heart disease Sister    Social History:  reports that he has never smoked. He does not have any smokeless tobacco history on file. He reports that he does not drink alcohol. His drug history not on file.  Allergies: No Known Allergies  No prescriptions prior to admission    No results found for this or any previous visit (from the past 48 hour(s)).  No results found.   Pertinent items are noted in HPI.  There were no vitals taken for this visit.  General appearance: alert, cooperative and appears stated age Head: Normocephalic, without obvious abnormality Neck: no adenopathy Resp: clear to auscultation bilaterally Cardio: regular rate and rhythm, S1, S2 normal, no murmur, click, rub or gallop GI: soft, non-tender; bowel sounds normal; no masses,  no organomegaly Extremities: extremities normal, atraumatic, no cyanosis or edema Pulses: 2+ and symmetric Skin: Skin color, texture, turgor normal. No rashes or lesions Neurologic: Grossly normal Incision/Wound: na  Assessment/Plan DIAGNOSIS:     Soft tissue tumor, unspecified with degenerative arthritis.    RECOMMENDATIONS/PLAN:     We would recommend excision of the mass, this may well be a synovial proliferation, there is potential that it could be a vascular  tumor, a foreign body granuloma, a rheumatoid nodule, inclusion cyst. It is difficult to determine the exact etiology of this.  It appears to be separated from the extensor tendon, but may be involved with it.    He is aware of risks and complications including  infection, recurrence, injury to arteries, nerves, tendons, loss of mobility. He is seen with his wife and they would like to proceed. He is scheduled for excision mass metacarpophalangeal joint left middle finger.  Adric Wrede R 10/14/2011, 4:30 AM

## 2011-10-14 NOTE — Discharge Instructions (Addendum)

## 2011-10-14 NOTE — Transfer of Care (Signed)
Immediate Anesthesia Transfer of Care Note  Patient: Derrick Sanchez  Procedure(s) Performed: Procedure(s) (LRB): EXCISION MASS (Left)  Patient Location: PACU  Anesthesia Type: MAC and Bier block  Level of Consciousness: awake, alert , oriented and patient cooperative  Airway & Oxygen Therapy: Patient Spontanous Breathing and Patient connected to face mask oxygen  Post-op Assessment: Report given to PACU RN and Post -op Vital signs reviewed and stable  Post vital signs: Reviewed and stable  Complications: No apparent anesthesia complications

## 2011-10-14 NOTE — Anesthesia Procedure Notes (Signed)
Procedure Name: MAC Date/Time: 10/14/2011 10:00 AM Performed by: Kiven Vangilder D Pre-anesthesia Checklist: Patient identified, Emergency Drugs available and Suction available Oxygen Delivery Method: Simple face mask

## 2011-10-14 NOTE — Brief Op Note (Signed)
10/14/2011  10:27 AM  PATIENT:  Derrick Sanchez  78 y.o. male  PRE-OPERATIVE DIAGNOSIS:  mass left hand  POST-OPERATIVE DIAGNOSIS:  mass left hand  PROCEDURE:  Procedure(s) (LRB): EXCISION MASS (Left)  SURGEON:  Surgeon(s) and Role:    * Nicki Reaper, MD - Primary    * Tami Ribas, MD - Assisting  PHYSICIAN ASSISTANT:   ASSISTANTS: Karlyn Agee, MD   ANESTHESIA:   local and regional  EBL:     BLOOD ADMINISTERED:none  DRAINS: none   LOCAL MEDICATIONS USED:  MARCAINE     SPECIMEN:  Excision  DISPOSITION OF SPECIMEN:  PATHOLOGY  COUNTS:  YES  TOURNIQUET:   Total Tourniquet Time Documented: Upper Arm (Left) - 17 minutes  DICTATION: .Other Dictation: Dictation Number [id number]  PLAN OF CARE: Discharge to home after PACU  PATIENT DISPOSITION:  PACU - hemodynamically stable.

## 2011-10-15 ENCOUNTER — Encounter (HOSPITAL_BASED_OUTPATIENT_CLINIC_OR_DEPARTMENT_OTHER): Payer: Self-pay | Admitting: Orthopedic Surgery

## 2011-11-11 ENCOUNTER — Ambulatory Visit: Payer: Medicare Other | Admitting: Gastroenterology

## 2011-12-10 ENCOUNTER — Encounter: Payer: Self-pay | Admitting: *Deleted

## 2011-12-11 ENCOUNTER — Encounter: Payer: Self-pay | Admitting: Gastroenterology

## 2011-12-11 ENCOUNTER — Ambulatory Visit (INDEPENDENT_AMBULATORY_CARE_PROVIDER_SITE_OTHER): Payer: Medicare Other | Admitting: Gastroenterology

## 2011-12-11 VITALS — BP 140/70 | HR 80 | Ht 72.0 in | Wt 176.8 lb

## 2011-12-11 DIAGNOSIS — K219 Gastro-esophageal reflux disease without esophagitis: Secondary | ICD-10-CM

## 2011-12-11 DIAGNOSIS — R1310 Dysphagia, unspecified: Secondary | ICD-10-CM

## 2011-12-11 MED ORDER — ESOMEPRAZOLE MAGNESIUM 40 MG PO CPDR: 40.0000 mg | DELAYED_RELEASE_CAPSULE | Freq: Two times a day (BID) | ORAL | Status: DC

## 2011-12-11 NOTE — Progress Notes (Signed)
History of Present Illness:  This is a very pleasant 78 year old Caucasian male in excellent health for his age. He has chronic acid reflux with manometry documented lower esophageal sphincter incompetency and low amplitude aperistalsis. He has done well over the year is on daily Nexium, but has recently noticed some intermittent solid food dysphagia in his upper substernal area. He has not had to vomit any food, and is maintaining a normal weight. Last colonoscopy 5 years ago was unremarkable except for mild diverticulosis. The patient does have rather severe degenerative arthritis of his hands , but no Raynaud's phenomenon or evidence of collagen vascular disease. He does not take NSAIDs, or use cigarettes or alcohol.  I have reviewed this patient's present history, medical and surgical past history, allergies and medications.     ROS: The remainder of the 10 point ROS is negative,,, chronic edema of his lower extremities with apparently negative Doppler exams. He also suffers from severe kyphosis related to lumbar disc surgery     Physical Exam: Blood pressure 140/70, pulse 80 and regular, weight 176 pounds with a BMI of 23.98. General well developed well nourished patient in no acute distress, appearing their stated age Eyes PERRLA, no icterus, fundoscopic exam per opthamologist Skin no lesions noted Neck supple, no adenopathy, no thyroid enlargement, no tenderness Chest clear to percussion and auscultation Heart no significant murmurs, gallops or rubs noted Abdomen no hepatosplenomegaly masses or tenderness, BS normal.  Extremities no acute joint lesions, edema, phlebitis or evidence of cellulitis. There is pitting edema of the left leg below his knee with rather marked stasis dermatitis. His trace edema in the right pretibial area. Pulses appear intact. He has chronic arthritis of his hands with ulnar deviation but no active red swollen joints. Neurologic patient oriented x 3, cranial  nerves intact, no focal neurologic deficits noted. Psychological mental status normal and normal affect.  Assessment and plan: Chronic GERD and the patient with an esophageal motility disorder associated with impaired acid clearance from his esophagus. Previous evaluations have not been consistent with achalasia. I have increased his Nexium to 40 mg twice a day before breakfast and dinner, and scheduled followup endoscopy with possible esophageal dilatation. I do not think he needs followup colonoscopy at this time. Review of recent lab data from Dr. Ouida Sills showed no metabolic abnormalities and a normal CBC and iron levels. Patient is chronically on B12 parenteral replacement therapy.  No diagnosis found.

## 2011-12-11 NOTE — Patient Instructions (Addendum)
You have been scheduled for an endoscopy with propofol. Please follow written instructions given to you at your visit today. We have sent the following medications to your pharmacy for you to pick up at your convenience: Nexium twice daily CC: Dr [name]

## 2011-12-22 ENCOUNTER — Ambulatory Visit (AMBULATORY_SURGERY_CENTER): Payer: Medicare Other | Admitting: Gastroenterology

## 2011-12-22 ENCOUNTER — Encounter: Payer: Self-pay | Admitting: Gastroenterology

## 2011-12-22 VITALS — BP 145/82 | HR 63 | Temp 96.7°F | Resp 17 | Ht 72.0 in | Wt 176.0 lb

## 2011-12-22 DIAGNOSIS — K224 Dyskinesia of esophagus: Secondary | ICD-10-CM

## 2011-12-22 DIAGNOSIS — K219 Gastro-esophageal reflux disease without esophagitis: Secondary | ICD-10-CM

## 2011-12-22 DIAGNOSIS — R1310 Dysphagia, unspecified: Secondary | ICD-10-CM

## 2011-12-22 DIAGNOSIS — K449 Diaphragmatic hernia without obstruction or gangrene: Secondary | ICD-10-CM

## 2011-12-22 MED ORDER — SODIUM CHLORIDE 0.9 % IV SOLN: 500.0000 mL | INTRAVENOUS | Status: DC

## 2011-12-22 MED ORDER — ESOMEPRAZOLE MAGNESIUM 40 MG PO CPDR: 40.0000 mg | DELAYED_RELEASE_CAPSULE | Freq: Two times a day (BID) | ORAL | Status: DC

## 2011-12-22 MED ORDER — NEXIUM 40 MG PO CPDR: 40.0000 mg | DELAYED_RELEASE_CAPSULE | Freq: Two times a day (BID) | ORAL | Status: DC

## 2011-12-22 NOTE — Patient Instructions (Addendum)
YOU HAD AN ENDOSCOPIC PROCEDURE TODAY AT THE [facility]: Refer to the procedure report that was given to you for any specific questions about what was found during the examination.  If the procedure report does not answer your questions, please call your gastroenterologist to clarify.  If you requested that your care partner not be given the details of your procedure findings, then the procedure report has been included in a sealed envelope for you to review at your convenience later.  YOU SHOULD EXPECT: Some feelings of bloating in the abdomen. Passage of more gas than usual.  Walking can help get rid of the air that was put into your GI tract during the procedure and reduce the bloating. If you had a lower endoscopy (such as a colonoscopy or flexible sigmoidoscopy) you may notice spotting of blood in your stool or on the toilet paper. If you underwent a bowel prep for your procedure, then you may not have a normal bowel movement for a few days.  DIET: Your first meal following the procedure should be a light meal and then it is ok to progress to your normal diet.  A half-sandwich or bowl of soup is an example of a good first meal.  Heavy or fried foods are harder to digest and may make you feel nauseous or bloated.  Likewise meals heavy in dairy and vegetables can cause extra gas to form and this can also increase the bloating.  Drink plenty of fluids but you should avoid alcoholic beverages for 24 hours.  ACTIVITY: Your care partner should take you home directly after the procedure.  You should plan to take it easy, moving slowly for the rest of the day.  You can resume normal activity the day after the procedure however you should NOT DRIVE or use heavy machinery for 24 hours (because of the sedation medicines used during the test).    SYMPTOMS TO REPORT IMMEDIATELY: A gastroenterologist can be reached at any hour.  During normal business hours, 8:30 AM to 5:00 PM Monday through Friday,  call [phone].  After hours and on weekends, please call the GI answering service at [phone] who will take a message and have the physician on call contact you.   Following lower endoscopy (colonoscopy or flexible sigmoidoscopy):  Excessive amounts of blood in the stool  Significant tenderness or worsening of abdominal pains  Swelling of the abdomen that is new, acute  Fever of 100F or higher  Following upper endoscopy (EGD)  Vomiting of blood or coffee ground material  New chest pain or pain under the shoulder blades  Painful or persistently difficult swallowing  New shortness of breath  Fever of 100F or higher  Black, tarry-looking stools  FOLLOW UP: If any biopsies were taken you will be contacted by phone or by letter within the next 1-3 weeks.  Call your gastroenterologist if you have not heard about the biopsies in 3 weeks.  Our staff will call the home number listed on your records the next business day following your procedure to check on you and address any questions or concerns that you may have at that time regarding the information given to you following your procedure. This is a courtesy call and so if there is no answer at the home number and we have not heard from you through the emergency physician on call, we will assume that you have returned to your regular daily activities without incident.  SIGNATURES/CONFIDENTIALITY: You and/or your care   partner have signed paperwork which will be entered into your electronic medical record.  These signatures attest to the fact that that the information above on your After Visit Summary has been reviewed and is understood.  Full responsibility of the confidentiality of this discharge information lies with you and/or your care-partner.  

## 2011-12-22 NOTE — Progress Notes (Signed)
Patient did not experience any of the following events: a burn prior to discharge; a fall within the facility; wrong site/side/patient/procedure/implant event; or a hospital transfer or hospital admission upon discharge from the facility. (G8907) Patient did not have preoperative order for IV antibiotic SSI prophylaxis. (G8918)  

## 2011-12-22 NOTE — Op Note (Signed)
[facility] [address] [place], [place]  [address]  ENDOSCOPY PROCEDURE REPORT  PATIENT:  Derrick Sanchez, Derrick Sanchez  MR#:  [id number] BIRTHDATE:  05-Dec-1930, 81 yrs. old  GENDER:  male  ENDOSCOPIST:  Vania Rea. Jarold Motto, MD, [facility] Referred by:  PROCEDURE DATE:  12/22/2011 PROCEDURE:  EGD, diagnostic 43235, Maloney Dilation of Esophagus ASA CLASS:  Class III INDICATIONS:  GERD, dysphagia ASSOCIATED ESOPHAGEAL MOTILITY DISORDER  MEDICATIONS:   propofol (Diprivan) 150 mg IV TOPICAL ANESTHETIC:  DESCRIPTION OF PROCEDURE:   After the risks and benefits of the procedure were explained, informed consent was obtained.  The [facility] GIF-H180 [id number] endoscope was introduced through the mouth and advanced to the second portion of the duodenum.  The instrument was slowly withdrawn as the mucosa was fully examined. <<PROCEDUREIMAGES>>  A hiatal hernia was found. A 5-6 CM. HH NOTED.DILATED #39F MALONEY DILATOR.REPEAT EXAM SHOWS NO HEME OR TEAR.  The upper, middle, and distal third of the esophagus were carefully inspected and no abnormalities were noted. The z-line was well seen at the GEJ. The endoscope was pushed into the fundus which was normal including a retroflexed view. The antrum,gastric body, first and second part of the duodenum were unremarkable.    Retroflexed views revealed Gastric band in place and a hiatal hernia.    The scope was then withdrawn from the patient and the procedure completed.  COMPLICATIONS:  None  ENDOSCOPIC IMPRESSION: 1) Hiatal hernia 2) Normal EGD LARGE HH WITH CHRONIC GERD AND ASSOCIATED ESOPHAGEAL MOTILITY DISORDER.,,,?? OCCULT STRICTURE. RECOMMENDATIONS: 1) Clear liquids until, then soft foods rest iof day. Resume prior diet tomorrow. 2) continue current medications CONSIDER REPEAT ESOPHAGEAL MANOMETRY EXAM.CONMTINUE BID PPI RX.   ______________________________ Vania Rea. Jarold Motto, MD, [facility]  CC:  [name], MD  n. Rosalie Doctor:   Vania Rea. Yohan Samons  at 12/22/2011 09:15 AM  Phill Mutter, [phone]

## 2011-12-22 NOTE — Progress Notes (Signed)
Obtain office visit for one month per Dr Jarold Motto.

## 2011-12-23 ENCOUNTER — Telehealth: Payer: Self-pay | Admitting: *Deleted

## 2011-12-23 NOTE — Telephone Encounter (Signed)
  Follow up Call-  Call back number 12/22/2011  Post procedure Call Back phone  # [phone] hm  Permission to leave phone message Yes     Patient questions:  Do you have a fever, pain , or abdominal swelling? no Pain Score  0 *  Have you tolerated food without any problems? yes  Have you been able to return to your normal activities? yes  Do you have any questions about your discharge instructions: Diet   no Medications  no Follow up visit  no  Do you have questions or concerns about your Care? no  Actions: * If pain score is 4 or above: No action needed, pain <4.

## 2012-01-15 ENCOUNTER — Encounter: Payer: Self-pay | Admitting: *Deleted

## 2012-01-21 ENCOUNTER — Other Ambulatory Visit (INDEPENDENT_AMBULATORY_CARE_PROVIDER_SITE_OTHER): Payer: Medicare Other

## 2012-01-21 ENCOUNTER — Ambulatory Visit (INDEPENDENT_AMBULATORY_CARE_PROVIDER_SITE_OTHER): Payer: Medicare Other | Admitting: Gastroenterology

## 2012-01-21 ENCOUNTER — Encounter: Payer: Self-pay | Admitting: Gastroenterology

## 2012-01-21 VITALS — BP 136/78 | HR 60 | Ht 72.0 in | Wt 178.0 lb

## 2012-01-21 DIAGNOSIS — M1990 Unspecified osteoarthritis, unspecified site: Secondary | ICD-10-CM

## 2012-01-21 DIAGNOSIS — K219 Gastro-esophageal reflux disease without esophagitis: Secondary | ICD-10-CM

## 2012-01-21 DIAGNOSIS — Z79899 Other long term (current) drug therapy: Secondary | ICD-10-CM

## 2012-01-21 DIAGNOSIS — R600 Localized edema: Secondary | ICD-10-CM

## 2012-01-21 DIAGNOSIS — K224 Dyskinesia of esophagus: Secondary | ICD-10-CM

## 2012-01-21 DIAGNOSIS — R609 Edema, unspecified: Secondary | ICD-10-CM

## 2012-01-21 DIAGNOSIS — D518 Other vitamin B12 deficiency anemias: Secondary | ICD-10-CM

## 2012-01-21 LAB — CBC WITH DIFFERENTIAL/PLATELET
Basophils Absolute: 0 10*3/uL (ref 0.0–0.1)
Basophils Relative: 0.3 % (ref 0.0–3.0)
Eosinophils Absolute: 0.3 10*3/uL (ref 0.0–0.7)
Eosinophils Relative: 6.6 % — ABNORMAL HIGH (ref 0.0–5.0)
HCT: 38.7 % — ABNORMAL LOW (ref 39.0–52.0)
Hemoglobin: 13.1 g/dL (ref 13.0–17.0)
Lymphocytes Relative: 24.7 % (ref 12.0–46.0)
Lymphs Abs: 1.3 10*3/uL (ref 0.7–4.0)
MCHC: 33.7 g/dL (ref 30.0–36.0)
MCV: 95.5 fl (ref 78.0–100.0)
Monocytes Absolute: 0.5 10*3/uL (ref 0.1–1.0)
Monocytes Relative: 8.9 % (ref 3.0–12.0)
Neutro Abs: 3.1 10*3/uL (ref 1.4–7.7)
Neutrophils Relative %: 59.5 % (ref 43.0–77.0)
Platelets: 160 10*3/uL (ref 150.0–400.0)
RBC: 4.06 Mil/uL — ABNORMAL LOW (ref 4.22–5.81)
RDW: 13.2 % (ref 11.5–14.6)
WBC: 5.3 10*3/uL (ref 4.5–10.5)

## 2012-01-21 LAB — VITAMIN B12: Vitamin B-12: 627 pg/mL (ref 211–911)

## 2012-01-21 LAB — IBC PANEL
Saturation Ratios: 25.6 % (ref 20.0–50.0)
Transferrin: 192.7 mg/dL — ABNORMAL LOW (ref 212.0–360.0)

## 2012-01-21 LAB — FERRITIN: Ferritin: 81.4 ng/mL (ref 22.0–322.0)

## 2012-01-21 MED ORDER — ESOMEPRAZOLE MAGNESIUM 40 MG PO CPDR: 40.0000 mg | DELAYED_RELEASE_CAPSULE | Freq: Two times a day (BID) | ORAL | Status: DC

## 2012-01-21 NOTE — Patient Instructions (Addendum)
Your physician has requested that you go to the basement for the following lab work before leaving today: Anemia Panel, IFOB, CBC. We have sent the following medications to your pharmacy for you to pick up at your convenience: Nexium.  CC:Dr. [name]

## 2012-01-21 NOTE — Progress Notes (Signed)
This is a 78 year old Caucasian male with chronic GERD and so-called scleroderma esophagus confirmed by manometry on 2 occasions. He has recurrent peptic strictures of his esophagus with recent endoscopy and dilatation. Also he has a 5 cm hiatal hernia. His dysphagia has improved dramatically since his endoscopy, and he is on Nexium 40 mg twice a day with minimal symptomatology. His appetite is good and his weight is stable. He denies hepatobiliary or lower gastrointestinal problems, and is up-to-date on his colonoscopy exams. He overall is in excellent health and is on very few medications. Other problems have included surgery for parathyroid adenoma, chronic B12 deficiency, and also apparently iron deficiency anemia. Colonoscopy and endoscopy in 2008 was otherwise negative except for diverticulosis. Patient denies melena, hematochezia, lower abdominal pain, anorexia or weight loss.  Current Medications, Allergies, Past Medical History, Past Surgical History, Family History and Social History were reviewed in [employer] record.  Pertinent Review of Systems Negative... chronic peripheral edema from previous bug bites and snakebites. Apparently he has had Doppler exams of his lower extremities which otherwise have been unremarkable. Also has a history of restless leg syndrome. He also has had previous removal of a parathyroid adenoma, and has sleep apnea. His serum calcium level is followed by primary care and Dr. Darnell Level.   Physical Exam: Blood pressure 136/78, pulse 60 and regular, weight 178 pounds and BMI 24.14. I cannot appreciate stigmata of chronic liver disease. He does have degenerative arthritis of his hands. Chest is clear and he is in a regular rhythm without murmurs gallops or rubs. I cannot appreciate hepatosplenomegaly, abdominal masses or tenderness. Bowel sounds are normal. Peripheral extremities are unremarkable except for rather severe edema in both legs, left  greater than right with stasis dermatitis. Pulses appear intact. Mental status is normal. Kyphosis of his back noted.    Assessment and Plan: Chronic esophageal motility disorder with incompetent lower esophageal sphincter, diminished esophageal peristalsis, and chronic GERD with recurrent peptic strictures. He is doing well on twice a day PPI therapy, and we will continue a reflux regime with when necessary dilations as needed. I did order an anemia profile followup and stool IFOB  Cards. Otherwise we will see him on a when necessary basis as needed. His chronic peripheral edema is followed by Dr. [name].  Encounter Diagnoses  Name Primary?  . GERD (gastroesophageal reflux disease) Yes  . Esophageal reflux

## 2012-01-27 ENCOUNTER — Other Ambulatory Visit: Payer: Medicare Other

## 2012-01-27 DIAGNOSIS — K219 Gastro-esophageal reflux disease without esophagitis: Secondary | ICD-10-CM

## 2012-01-27 LAB — FECAL OCCULT BLOOD, IMMUNOCHEMICAL: Fecal Occult Bld: POSITIVE

## 2012-01-30 ENCOUNTER — Ambulatory Visit (AMBULATORY_SURGERY_CENTER): Payer: Medicare Other

## 2012-01-30 VITALS — Ht 69.5 in | Wt 180.1 lb

## 2012-01-30 DIAGNOSIS — R195 Other fecal abnormalities: Secondary | ICD-10-CM

## 2012-01-30 DIAGNOSIS — Z1211 Encounter for screening for malignant neoplasm of colon: Secondary | ICD-10-CM

## 2012-01-30 MED ORDER — MOVIPREP 100 G PO SOLR: 1.0000 | Freq: Once | ORAL | Status: DC

## 2012-02-02 ENCOUNTER — Encounter: Payer: Self-pay | Admitting: Gastroenterology

## 2012-02-02 ENCOUNTER — Ambulatory Visit (AMBULATORY_SURGERY_CENTER): Payer: Medicare Other | Admitting: Gastroenterology

## 2012-02-02 VITALS — BP 151/84 | HR 61 | Temp 96.3°F | Resp 18 | Ht 72.0 in | Wt 178.0 lb

## 2012-02-02 DIAGNOSIS — R195 Other fecal abnormalities: Secondary | ICD-10-CM

## 2012-02-02 DIAGNOSIS — Z1211 Encounter for screening for malignant neoplasm of colon: Secondary | ICD-10-CM

## 2012-02-02 DIAGNOSIS — K5730 Diverticulosis of large intestine without perforation or abscess without bleeding: Secondary | ICD-10-CM

## 2012-02-02 DIAGNOSIS — D126 Benign neoplasm of colon, unspecified: Secondary | ICD-10-CM

## 2012-02-02 MED ORDER — SODIUM CHLORIDE 0.9 % IV SOLN: 500.0000 mL | INTRAVENOUS | Status: DC

## 2012-02-02 NOTE — Progress Notes (Signed)
Patient did not experience any of the following events: a burn prior to discharge; a fall within the facility; wrong site/side/patient/procedure/implant event; or a hospital transfer or hospital admission upon discharge from the facility. (G8907) Patient did not have preoperative order for IV antibiotic SSI prophylaxis. (G8918)  

## 2012-02-02 NOTE — Progress Notes (Signed)
Propofol per [facility] crna. See scanned intra procedure report.  All meds titrated per crna during procedure. ewm

## 2012-02-02 NOTE — Patient Instructions (Signed)
YOU HAD AN ENDOSCOPIC PROCEDURE TODAY AT THE [facility]: Refer to the procedure report that was given to you for any specific questions about what was found during the examination.  If the procedure report does not answer your questions, please call your gastroenterologist to clarify.  If you requested that your care partner not be given the details of your procedure findings, then the procedure report has been included in a sealed envelope for you to review at your convenience later.  YOU SHOULD EXPECT: Some feelings of bloating in the abdomen. Passage of more gas than usual.  Walking can help get rid of the air that was put into your GI tract during the procedure and reduce the bloating. If you had a lower endoscopy (such as a colonoscopy or flexible sigmoidoscopy) you may notice spotting of blood in your stool or on the toilet paper. If you underwent a bowel prep for your procedure, then you may not have a normal bowel movement for a few days.  DIET: Your first meal following the procedure should be a light meal and then it is ok to progress to your normal diet.  A half-sandwich or bowl of soup is an example of a good first meal.  Heavy or fried foods are harder to digest and may make you feel nauseous or bloated.  Likewise meals heavy in dairy and vegetables can cause extra gas to form and this can also increase the bloating.  Drink plenty of fluids but you should avoid alcoholic beverages for 24 hours.  ACTIVITY: Your care partner should take you home directly after the procedure.  You should plan to take it easy, moving slowly for the rest of the day.  You can resume normal activity the day after the procedure however you should NOT DRIVE or use heavy machinery for 24 hours (because of the sedation medicines used during the test).    SYMPTOMS TO REPORT IMMEDIATELY: A gastroenterologist can be reached at any hour.  During normal business hours, 8:30 AM to 5:00 PM Monday through Friday,  call [phone].  After hours and on weekends, please call the GI answering service at [phone] who will take a message and have the physician on call contact you.   Following lower endoscopy (colonoscopy or flexible sigmoidoscopy):  Excessive amounts of blood in the stool  Significant tenderness or worsening of abdominal pains  Swelling of the abdomen that is new, acute  Fever of 100F or higher   FOLLOW UP: If any biopsies were taken you will be contacted by phone or by letter within the next 1-3 weeks.  Call your gastroenterologist if you have not heard about the biopsies in 3 weeks.  Our staff will call the home number listed on your records the next business day following your procedure to check on you and address any questions or concerns that you may have at that time regarding the information given to you following your procedure. This is a courtesy call and so if there is no answer at the home number and we have not heard from you through the emergency physician on call, we will assume that you have returned to your regular daily activities without incident.  SIGNATURES/CONFIDENTIALITY: You and/or your care partner have signed paperwork which will be entered into your electronic medical record.  These signatures attest to the fact that that the information above on your After Visit Summary has been reviewed and is understood.  Full responsibility of the confidentiality of   this discharge information lies with you and/or your care-partner.    INFORMATION ON DIVERTICULOSIS & HIGH FIBER DIET GIVEN TO YOU TODAY 

## 2012-02-02 NOTE — Op Note (Signed)
[facility] [address] [place], [address]   COLONOSCOPY PROCEDURE REPORT  PATIENT: Derrick Sanchez, Derrick Sanchez  MR#: [id number] BIRTHDATE: 05-18-1931 , 81  yrs. old GENDER: Male ENDOSCOPIST: Mardella Layman, MD, [facility] REFERRED BY:  [name], M.D. PROCEDURE DATE:  02/02/2012 PROCEDURE:   Colonoscopy, diagnostic ASA CLASS:   Class III INDICATIONS:guaiac ++ stool. MEDICATIONS: propofol (Diprivan) 200mg  IV  DESCRIPTION OF PROCEDURE:   After the risks and benefits and of the procedure were explained, informed consent was obtained.  A digital rectal exam revealed no abnormalities of the rectum.   The LB [id number]  endoscope was introduced through the anus and advanced to the cecum, which was identified by both the appendix and ileocecal valve .  The quality of the prep was excellent, using MoviPrep .  The instrument was then slowly withdrawn as the colon was fully examined.    FINDINGS:  COLON FINDINGS: Moderate diverticulosis was noted in the descending colon and sigmoid colon.   The colon mucosa was otherwise normal. Retroflexed views revealed no abnormalities.    The scope was then withdrawn from the patient and the procedure completed.  COMPLICATIONS: There were no complications.  ENDOSCOPIC IMPRESSION: 1.   Moderate diverticulosis was noted in the descending colon and sigmoid colon 2.   The colon mucosa was otherwise normal 3.   No polyps noted  RECOMMENDATIONS: 1.  Continue current medications 2.  High fiber diet. 3.  Titrate to need   REPEAT EXAM:  standard discharge  _______________________________ eSigned:  Mardella Layman, MD, [facility] 02/02/2012 3:00 PM

## 2012-02-03 ENCOUNTER — Telehealth: Payer: Self-pay

## 2012-02-03 NOTE — Telephone Encounter (Signed)
  Follow up Call-  Call back number 02/02/2012 12/22/2011  Post procedure Call Back phone  # [phone] hm  Permission to leave phone message Yes Yes     Patient questions:  Do you have a fever, pain , or abdominal swelling? no Pain Score  0 *  Have you tolerated food without any problems? yes  Have you been able to return to your normal activities? yes  Do you have any questions about your discharge instructions: Diet   no Medications  no Follow up visit  no  Do you have questions or concerns about your Care? no  Actions: * If pain score is 4 or above: No action needed, pain <4.

## 2012-03-04 ENCOUNTER — Other Ambulatory Visit: Payer: Self-pay | Admitting: Dermatology

## 2012-12-22 ENCOUNTER — Ambulatory Visit (HOSPITAL_COMMUNITY)
Admission: RE | Admit: 2012-12-22 | Discharge: 2012-12-22 | Disposition: A | Discharge status: Home / Self Care | Payer: Medicare Other | Source: Ambulatory Visit | Attending: Internal Medicine | Admitting: Internal Medicine

## 2012-12-22 DIAGNOSIS — IMO0001 Reserved for inherently not codable concepts without codable children: Secondary | ICD-10-CM | POA: Insufficient documentation

## 2012-12-22 DIAGNOSIS — M2560 Stiffness of unspecified joint, not elsewhere classified: Secondary | ICD-10-CM | POA: Insufficient documentation

## 2012-12-22 DIAGNOSIS — Z9181 History of falling: Secondary | ICD-10-CM | POA: Insufficient documentation

## 2012-12-22 DIAGNOSIS — R269 Unspecified abnormalities of gait and mobility: Secondary | ICD-10-CM | POA: Insufficient documentation

## 2012-12-22 DIAGNOSIS — R293 Abnormal posture: Secondary | ICD-10-CM | POA: Insufficient documentation

## 2012-12-22 NOTE — Evaluation (Signed)
Physical Therapy Evaluation  Patient Details  Name: JP EASTHAM MRN: [id number] Date of Birth: 08/12/1930  Today's Date: 12/22/2012 Time: 1525-1604 PT Time Calculation (min): 39 min Charge: evaluation             Visit#: 1 of 8  Re-eval: 01/21/13 Assessment Diagnosis: abnormal gt Next MD Visit: 01/13/2013  Authorization: BCBS medicare     Authorization Visit#: 1 of 10   Past Medical History:  Past Medical History  Diagnosis Date  . Obstructive sleep apnea (adult) (pediatric)   . Unspecified asthma   . Hyperparathyroidism, unspecified   . Other vitamin B12 deficiency anemia   . Restless legs syndrome (RLS)   . Esophageal reflux 2008    EGD  . Diverticulosis of colon (without mention of hemorrhage) 2008,2006,2003    Colonoscopy  . Glaucoma   . Internal hemorrhoids without mention of complication 2006    Colonoscopy  . Candidiasis of the esophagus 07-17-2000    EGD  . Stricture and stenosis of esophagus 05-27-2002    EGD  . Colon polyps 05-27-2002    Tubulovillous Adenoma-Colonoscopy  . Hiatal hernia 2002,2003,2013    EGD  . Allergy   . Arthritis   . Blood transfusion   . Cataract     bil eyes   Past Surgical History:  Past Surgical History  Procedure Laterality Date  . Parathyroidectomy    . Total knee arthroplasty      lt  . Hip surgery      rt  . Colonoscopy    . Eye surgery      both cataracts  . Mass excision  10/14/2011    Procedure: EXCISION MASS;  Surgeon: Nicki Reaper, MD;  Location: [facility];  Service: Orthopedics;  Laterality: Left;   left hand    Subjective Symptoms/Limitations Symptoms: Mr. Scharnhorst states that he has started walking over stooped over after his right hip was replaced in 1998 and it keeps getting worse.  The patient states that this seems to be affecting his balance.  The patient has been falling and this concerns him. Pertinent History: R hip replaced, L knee replaced, 3 fx vertebrae. How long can you sit  comfortably?: able to sit for 30 minutes. How long can you stand comfortably?: Pt able to stand as long as he wants How long can you walk comfortably?: No problem Pain Assessment Currently in Pain?: Yes Pain Score: 8  Pain Location: Back Pain Orientation: Lower;Mid Pain Onset: More than a month ago Pain Frequency: Intermittent Pain Relieving Factors: rest Effect of Pain on Daily Activities: increases pain  Precautions/Restrictions     Balance Screening Balance Screen Has the patient fallen in the past 6 months: Yes How many times?: 2 (2) Has the patient had a decrease in activity level because of a fear of falling? : No  Prior Functioning  Prior Function Leisure: Hobbies-yes (Comment) Comments: garden, mow yard.  Cognition/Observation Observation/Other Assessments Observations: forward bent 20 degrees, head 3 1/2 inches forward of shoulder, flat back.    Assessment RLE Strength Right Hip Flexion: 5/5 Right Hip Extension: 4/5 Right Hip ABduction: 5/5 Right Hip ADduction: 5/5 Right Knee Flexion: 5/5 Right Knee Extension: 5/5 Right Ankle Dorsiflexion: 3/5 LLE Strength Left Hip Flexion: 5/5 Left Hip Extension: 3/5 Left Hip ABduction: 5/5 Left Hip ADduction: 5/5 Left Knee Flexion: 5/5 Left Knee Extension: 5/5 Left Ankle Dorsiflexion: 3/5  Exercise/Treatments Mobility/Balance  Posture/Postural Control Posture/Postural Control:  (Pt keeps head SB to Right by 30  degrees able to self correct) Static Standing Balance Single Leg Stance - Right Leg: 3 Single Leg Stance - Left Leg: 6 Tandem Stance - Right Leg: 33 Tandem Stance - Left Leg: 60     Standing Exercises Other Standing Exercises: SLS x 3; tandem stance x 2. Seated Exercises Neck Retraction: 10 reps Lateral Flexion: Left;10 reps W Back: 10 reps Postural Training:  (sit as tall as possible and hold.) Other Seated Exercise: Ankle dorsiflexion/plantarflexion Supine Exercises Other Supine Exercise: bridge  x 10;      Physical Therapy Assessment and Plan PT Assessment and Plan Clinical Impression Statement: Pt with significant postural dysfunciton as well as weak hip extensors and decreased balance who will benefit from skilled therapy to improve posture to decrease pain and improve balance to decrease risk of falling. Rehab Potential: Good PT Frequency: Min 2X/week PT Duration: 4 weeks PT Treatment/Interventions: Therapeutic exercise;Balance training PT Plan: begin MEEKS decompressive exerciese, UBE backward, T-band exercises progress to standing B flexion, Meeks T-band exercises, prone strengthening for thoracic area.    Goals Home Exercise Program Pt/caregiver will Perform Home Exercise Program: increased ROM PT Short Term Goals Time to Complete Short Term Goals: 2 weeks PT Short Term Goal 1: Pt to hold head at onlly 10 degree Sidebend for improved posture. PT Short Term Goal 2: Pt to stand with only 15 degree forward bend to show improvement of posture PT Short Term Goal 3: Pt ear to be only 2 1/2 inches from shoulder to show improvement in posture PT Short Term Goal 4: mm strength improved 1/2 grade to allow increased stability. PT Short Term Goal 5: no falls to decrease risk of injury PT Long Term Goals Time to Complete Long Term Goals: 4 weeks PT Long Term Goal 1: pt to have no SB in cervical area at rest for improved posture PT Long Term Goal 2: Pt to be able to stand erect without thinking of it,(natural standing state) to decrease back pain Long Term Goal 3: Pt ear to be 2" in front of shoulder.  Long Term Goal 4: No falls in 4 weeks. PT Long Term Goal 5: I in advance HEP Additional PT Long Term Goals?: Yes PT Long Term Goal 6: SLS to be 30 seconds to decrease risk of falling PT Long Term Goal 7: hip extensor stregth 5/5  Problem List Patient Active Problem List   Diagnosis Date Noted  . Stiffness of joints, not elsewhere classified, multiple sites 12/22/2012  . Postural  imbalance 12/22/2012  . Hx of falling 12/22/2012  . HYPERPARATHYROIDISM UNSPECIFIED 05/03/2010  . OBSTRUCTIVE SLEEP APNEA 05/03/2010  . GLAUCOMA 05/03/2010  . ASTHMA 05/03/2010  . ANEMIA, VITAMIN B12 DEFICIENCY 06/14/2007  . RESTLESS LEG SYNDROME 06/14/2007  . GASTROESOPHAGEAL REFLUX DISEASE 06/14/2007  . DIVERTICULOSIS, COLON 05/31/2007  . POLYP, COLON 05/27/2002    PT Plan of Care PT Home Exercise Plan: given  GP Functional Assessment Tool Used: clinical judgement / ABC Functional Limitation: Self care Self Care Current Status (Z6109): At least 20 percent but less than 40 percent impaired, limited or restricted Self Care Goal Status (U0454): At least 1 percent but less than 20 percent impaired, limited or restricted  Lyndall Windt,CINDY 12/22/2012, 4:44 PM  Physician Documentation Your signature is required to indicate approval of the treatment plan as stated above.  Please sign and either send electronically or make a copy of this report for your files and return this physician signed original.   Please mark one 1.__approve of plan  2. ___approve of plan with the following conditions.   ______________________________                                                          _____________________ Physician Signature                                                                                                             Date  

## 2012-12-28 ENCOUNTER — Ambulatory Visit (HOSPITAL_COMMUNITY)
Admission: RE | Admit: 2012-12-28 | Discharge: 2012-12-28 | Disposition: A | Discharge status: Home / Self Care | Payer: Medicare Other | Source: Ambulatory Visit | Attending: Internal Medicine | Admitting: Internal Medicine

## 2012-12-28 NOTE — Progress Notes (Signed)
Physical Therapy Treatment Patient Details  Name: Derrick Sanchez MRN: [id number] Date of Birth: February 28, 1931  Today's Date: 12/28/2012 Time: 4098-1191 PT Time Calculation (min): 40 min  Visit#: 2 of 8  Re-eval: 01/21/13 Charges: Therex x [phone])  Authorization: BCBS medicare  Authorization Visit#: 2 of 10   Subjective: Symptoms/Limitations Symptoms: Pt reports HEP compliance. Pain Assessment Currently in Pain?: No/denies   Exercise/Treatments Theraband Exercises Shoulder Extension: 10 reps;Green Rows: 10 reps;Green Seated Exercises Neck Retraction: 10 reps Cervical Rotation: 10 reps;Both Lateral Flexion: 10 reps;Both W Back: 10 reps Postural Training: Sitting as tall as possible in front of mirror Other Seated Exercise: Ankle dorsiflexion/plantarflexion Supine Bridge: 10 reps Other Supine Lumbar Exercises: Decompressive exercises 1-5 Prone  Other Prone Lumbar Exercises: POE x 5' with rigth cervical rotation and manual stretching of bilateral quads  Physical Therapy Assessment and Plan PT Assessment and Plan Clinical Impression Statement: Treatment focus on improving postural strength. Pt is able correct posture independently when using a mirror. Pt requires frequent cueing throughout session to facilitate correct posture. Began scapular tband exercises max cueing for form. Also, began decompression exercises to improve posture. Encouraged prone position for HEP to decrease hip flexor and lumbar tightness.  PT Plan: Continue to improve posture and gait mechanics. Begin upper trap stretch next session.     Problem List Patient Active Problem List   Diagnosis Date Noted  . Stiffness of joints, not elsewhere classified, multiple sites 12/22/2012  . Postural imbalance 12/22/2012  . Hx of falling 12/22/2012  . HYPERPARATHYROIDISM UNSPECIFIED 05/03/2010  . OBSTRUCTIVE SLEEP APNEA 05/03/2010  . GLAUCOMA 05/03/2010  . ASTHMA 05/03/2010  . ANEMIA, VITAMIN B12  DEFICIENCY 06/14/2007  . RESTLESS LEG SYNDROME 06/14/2007  . GASTROESOPHAGEAL REFLUX DISEASE 06/14/2007  . DIVERTICULOSIS, COLON 05/31/2007  . POLYP, COLON 05/27/2002    General Behavior During Therapy: [facility] for tasks assessed/performed   Seth Bake, PTA  12/28/2012, 9:30 AM

## 2012-12-30 ENCOUNTER — Ambulatory Visit (HOSPITAL_COMMUNITY)
Admission: RE | Admit: 2012-12-30 | Discharge: 2012-12-30 | Disposition: A | Discharge status: Home / Self Care | Payer: Medicare Other | Source: Ambulatory Visit | Attending: Internal Medicine | Admitting: Internal Medicine

## 2012-12-30 NOTE — Progress Notes (Signed)
Physical Therapy Treatment Patient Details  Name: Derrick Sanchez MRN: [id number] Date of Birth: 1931-05-18  Today's Date: 12/30/2012 Time: 0805-0840 PT Time Calculation (min): 35 min Visit#: 3 of 8  Re-eval: 01/21/13 Authorization: [employer]BS medicare  Authorization Visit#: 3 of 10  Charges:  therex [phone]'), massage [phone] ([phone]  Subjective:  Pt states his pain is 5/10 in his cervical region, mostly on his Rt side.       Exercise/Treatments Stretches Upper Trapezius Stretch: 2 reps Theraband Exercises Scapula Retraction: 10 reps;Green Shoulder Extension: 10 reps;Green Rows: 10 reps;Green Standing Exercises Other Standing Exercises: heelraise/toeraise 10 reps, SLS x 3; tandem stance x 2. Seated Exercises Neck Retraction: 5 reps Cervical Rotation: 10 reps;Both Lateral Flexion: 10 reps;Both W Back: 10 reps      Manual Therapy Manual Therapy: Massage Massage: to bilateral cervical Rt>Lt to decrease spasm/tightness  Physical Therapy Assessment and Plan PT Assessment and Plan Clinical Impression Statement: Focused on improving postural strength while decreasing spasm/tightness in cervical area.  Pt requires max postural cues during therex.  Progressed seated heel/toeraises to standing to increase strength and stability.  Added upper trap stretch for Bilateral cervical region.  Evaluating therapist Donnamae Jude, PT) approved addition of manual techniques.  Pt with multiple spasms reduced 50% with massage to bilateral upper traps. PT Plan: Continue to improve posture and gait mechanics. Add manual techniques as needed to decrease spasm and pain.     Problem List Patient Active Problem List   Diagnosis Date Noted  . Stiffness of joints, not elsewhere classified, multiple sites 12/22/2012  . Postural imbalance 12/22/2012  . Hx of falling 12/22/2012  . HYPERPARATHYROIDISM UNSPECIFIED 05/03/2010  . OBSTRUCTIVE SLEEP APNEA 05/03/2010  . GLAUCOMA 05/03/2010  . ASTHMA  05/03/2010  . ANEMIA, VITAMIN B12 DEFICIENCY 06/14/2007  . RESTLESS LEG SYNDROME 06/14/2007  . GASTROESOPHAGEAL REFLUX DISEASE 06/14/2007  . DIVERTICULOSIS, COLON 05/31/2007  . POLYP, COLON 05/27/2002        Lurena Nida, PTA/CLT 12/30/2012, 8:43 AM

## 2013-01-04 ENCOUNTER — Ambulatory Visit (HOSPITAL_COMMUNITY)
Admission: RE | Admit: 2013-01-04 | Discharge: 2013-01-04 | Disposition: A | Discharge status: Home / Self Care | Payer: Medicare Other | Source: Ambulatory Visit | Attending: Internal Medicine | Admitting: Internal Medicine

## 2013-01-04 NOTE — Progress Notes (Signed)
Physical Therapy Treatment Patient Details  Name: Derrick Sanchez MRN: [id number] Date of Birth: 18-Feb-1931  Today's Date: 01/04/2013 Time: 0801-0841 PT Time Calculation (min): 40 min  Visit#: 4 of 8  Re-eval: 01/21/13 Authorization: [employer] medicare  Authorization Visit#: 4 of 10  Charges:  therex [phone] ([phone]'), massage [phone]  Subjective: Symptoms/Limitations Symptoms: Pt states his neck is overall feeling better, however his balance is still not coming along as he'd hoped. Pain Assessment Currently in Pain?: No/denies  Exercise/Treatments Stretches Upper Trapezius Stretch: 2 reps;30 seconds Machines for Strengthening UBE (Upper Arm Bike): 4' backward Theraband Exercises Scapula Retraction: 10 reps;Green Shoulder Extension: 10 reps;Green Rows: 10 reps;Green Standing Exercises Other Standing Exercises: heelraise/toeraise 10 reps, SLS x 3; tandem stance x 2. Other Standing Exercises: tandem gait 2RT Seated Exercises W Back: 10 reps   Manual Therapy Manual Therapy: Massage Massage: to bilateral cervical Rt>Lt to decrease spasm/tightness  Physical Therapy Assessment and Plan PT Assessment and Plan Clinical Impression Statement: Began tandem gait with some difficulty, retro without difficulty (easy).  Pt continues to have multiple spasms/tightness into Bilateral cervical area, however improved with STM.  Pt with noted improved form with therex requiring less verbal cues to complete. PT Plan: Continue to improve posture and gait mechanics. Add manual techniques as needed to decrease spasm and pain.  Progress balance activities.     Problem List Patient Active Problem List   Diagnosis Date Noted  . Stiffness of joints, not elsewhere classified, multiple sites 12/22/2012  . Postural imbalance 12/22/2012  . Hx of falling 12/22/2012  . HYPERPARATHYROIDISM UNSPECIFIED 05/03/2010  . OBSTRUCTIVE SLEEP APNEA 05/03/2010  . GLAUCOMA 05/03/2010  . ASTHMA 05/03/2010  .  ANEMIA, VITAMIN B12 DEFICIENCY 06/14/2007  . RESTLESS LEG SYNDROME 06/14/2007  . GASTROESOPHAGEAL REFLUX DISEASE 06/14/2007  . DIVERTICULOSIS, COLON 05/31/2007  . POLYP, COLON 05/27/2002    PT - End of Session Equipment Utilized During Treatment: Gait belt General Behavior During Therapy: [facility] for tasks assessed/performed   Lurena Nida, PTA/CLT 01/04/2013, 8:45 AM

## 2013-01-06 ENCOUNTER — Ambulatory Visit (HOSPITAL_COMMUNITY)
Admission: RE | Admit: 2013-01-06 | Discharge: 2013-01-06 | Disposition: A | Discharge status: Home / Self Care | Payer: Medicare Other | Source: Ambulatory Visit | Attending: Internal Medicine | Admitting: Internal Medicine

## 2013-01-06 NOTE — Progress Notes (Signed)
Physical Therapy Treatment Patient Details  Name: Derrick Sanchez MRN: [id number] Date of Birth: 09/24/1930  Today's Date: 01/06/2013 Time: 6962-9528 PT Time Calculation (min): 43 min  Visit#: 5 of 8  Re-eval: 01/21/13 Charges: Therex x 30' [phone]) Manual x 10' [phone])  Authorization: [employer] medicare  Authorization Visit#: 5 of 10   Subjective: Symptoms/Limitations Symptoms: Pt reprots HEP compliance. Pt complains of LBP which he states that he has often. Pain Assessment Currently in Pain?: Yes Pain Score: 6  Pain Location: Back Pain Orientation: Lower  Precautions/Restrictions     Exercise/Treatments Stretches Upper Trapezius Stretch: 1 rep;60 seconds Machines for Strengthening UBE (Upper Arm Bike): 6' @ 2.0backward Theraband Exercises Scapula Retraction: 10 reps;Green Shoulder Extension: 10 reps;Green Rows: 10 reps;Green Standing Exercises Other Standing Exercises: heelraise/toeraise 10 reps; tandem gait 2RT Other Standing Exercises: Scapular retraction/protraction with hand on wall x 10 Seated Exercises W Back: 10 reps  Manual Therapy Manual Therapy: Massage Massage: to right cervical musculature to decrease  Physical Therapy Assessment and Plan PT Assessment and Plan Clinical Impression Statement: Pt displays improvements in cervical ROM and posture. Began protraction/retraction with hand forward on wall to improve scapular coordination. Manual techniques completed to right cervical musculature to improve posture. Pt educated on becoming more conscious of scapular retraction throughout the day. PT Plan: Continue to improve posture and gait mechanics. Add manual techniques as needed to decrease spasm and pain.  Progress balance activities.     Problem List Patient Active Problem List   Diagnosis Date Noted  . Stiffness of joints, not elsewhere classified, multiple sites 12/22/2012  . Postural imbalance 12/22/2012  . Hx of falling 12/22/2012  .  HYPERPARATHYROIDISM UNSPECIFIED 05/03/2010  . OBSTRUCTIVE SLEEP APNEA 05/03/2010  . GLAUCOMA 05/03/2010  . ASTHMA 05/03/2010  . ANEMIA, VITAMIN B12 DEFICIENCY 06/14/2007  . RESTLESS LEG SYNDROME 06/14/2007  . GASTROESOPHAGEAL REFLUX DISEASE 06/14/2007  . DIVERTICULOSIS, COLON 05/31/2007  . POLYP, COLON 05/27/2002    PT - End of Session Equipment Utilized During Treatment: Gait belt General Behavior During Therapy: [facility] for tasks assessed/performed  Seth Bake, PTA 01/06/2013, 4:54 PM

## 2013-01-11 ENCOUNTER — Ambulatory Visit (HOSPITAL_COMMUNITY)
Admission: RE | Admit: 2013-01-11 | Discharge: 2013-01-11 | Disposition: A | Discharge status: Home / Self Care | Payer: Medicare Other | Source: Ambulatory Visit | Attending: Internal Medicine | Admitting: Internal Medicine

## 2013-01-11 NOTE — Progress Notes (Signed)
Physical Therapy Treatment Patient Details  Name: Derrick Sanchez MRN: [id number] Date of Birth: 12/28/1930  Today's Date: 01/11/2013 Time: 4098-1191 PT Time Calculation (min): 41 min  Visit#: 6 of 8  Re-eval: 01/21/13 Charges: Therex x 30' [phone]) manual x 8' [phone])  Authorization: [employer] medicare  Authorization Visit#: 5 of 10   Subjective: Symptoms/Limitations Symptoms: Pt states that he can tell a difference in his posture.  Pain Assessment Currently in Pain?: No/denies   Exercise/Treatments Machines for Strengthening UBE (Upper Arm Bike): 6' @ 2.0backward Theraband Exercises Scapula Retraction: 10 reps;Green Shoulder Extension: 10 reps;Green Rows: 10 reps;Green Standing Exercises Other Standing Exercises: heelraise/toeraise 10 reps; tandem gait 2RT Other Standing Exercises: Scapular retraction/protraction with hand on wall x 10  Manual Therapy Manual Therapy: Myofascial release Myofascial Release: to right cervical musculature to decrease tightness and spasms  Physical Therapy Assessment and Plan PT Assessment and Plan Clinical Impression Statement: Pt continues to displays improvement in ROM and posture. Right cervical side bend increases when pt is not concentrating. Manual techniques completed to right cervical musculature with left side bend. BLE were measured and RLE is 1/2" longer than the left. Pt has 1/2 inch insert for several years. Suggested that pt look into purchasing a new insert as this one appears worn. PT Plan: Continue to improve posture and gait mechanics. Add manual techniques as needed to decrease spasm and pain.  Progress balance activities.    Goals    Problem List Patient Active Problem List   Diagnosis Date Noted  . Stiffness of joints, not elsewhere classified, multiple sites 12/22/2012  . Postural imbalance 12/22/2012  . Hx of falling 12/22/2012  . HYPERPARATHYROIDISM UNSPECIFIED 05/03/2010  . OBSTRUCTIVE SLEEP APNEA 05/03/2010   . GLAUCOMA 05/03/2010  . ASTHMA 05/03/2010  . ANEMIA, VITAMIN B12 DEFICIENCY 06/14/2007  . RESTLESS LEG SYNDROME 06/14/2007  . GASTROESOPHAGEAL REFLUX DISEASE 06/14/2007  . DIVERTICULOSIS, COLON 05/31/2007  . POLYP, COLON 05/27/2002    PT - End of Session Equipment Utilized During Treatment: Gait belt General Behavior During Therapy: [facility] for tasks assessed/performed  Seth Bake, PTA 01/11/2013, 9:07 AM

## 2013-01-13 ENCOUNTER — Ambulatory Visit (HOSPITAL_COMMUNITY)
Admission: RE | Admit: 2013-01-13 | Discharge: 2013-01-13 | Disposition: A | Discharge status: Home / Self Care | Payer: Medicare Other | Source: Ambulatory Visit | Attending: Internal Medicine | Admitting: Internal Medicine

## 2013-01-13 NOTE — Progress Notes (Signed)
Physical Therapy Treatment Patient Details  Name: Derrick Sanchez MRN: [id number] Date of Birth: [birth year]  Today's Date: 01/13/2013 Time: 0802-0842 PT Time Calculation (min): 40 min  Visit#: 7 of 8  Re-eval: 01/21/13 Charges: Therex x 28' (0802-0830) Manual x 10' (0832-0842)  Authorization: BC[employer] medicare   Authorization Visit#: 7 of 10   Subjective: Symptoms/Limitations Symptoms: Pt reports continued HEP compliance. Pain Assessment Currently in Pain?: No/denies  Precautions/Restrictions     Exercise/Treatments Theraband Exercises Scapula Retraction: 10 reps;Green Shoulder Extension: 10 reps;Green Rows: 10 reps;Green Standing Exercises Other Standing Exercises: heelraise/toeraise 10 reps; tandem gait 2RT Other Standing Exercises: Scapular retraction/protraction with hand on wall x 10 Seated Exercises Lateral Flexion: 10 reps;Both;Limitations Lateral Flexion Limitations: sitting on hands to avoid shoulder elevation W Back: 10 reps  Manual Therapy Myofascial Release: MFR to right sternocleidomastoid in supine with passive left rotation and lateral flexion  Physical Therapy Assessment and Plan PT Assessment and Plan Clinical Impression Statement: Pt continues to displays improved posture and ROM. Pt displays improved coordination in scapular musculature. SCM appears to be the tightest muscle on right side. MFR completed to this musculature to decrease tightness and improve posture. PT Plan: Reassess next session.     Problem List Patient Active Problem List   Diagnosis Date Noted  . Stiffness of joints, not elsewhere classified, multiple sites 12/22/2012  . Postural imbalance 12/22/2012  . Hx of falling 12/22/2012  . HYPERPARATHYROIDISM UNSPECIFIED 05/03/2010  . OBSTRUCTIVE SLEEP APNEA 05/03/2010  . GLAUCOMA 05/03/2010  . ASTHMA 05/03/2010  . ANEMIA, VITAMIN B12 DEFICIENCY 06/14/2007  . RESTLESS LEG SYNDROME 06/14/2007  . GASTROESOPHAGEAL REFLUX DISEASE  06/14/2007  . DIVERTICULOSIS, COLON 05/31/2007  . POLYP, COLON 05/27/2002    PT - End of Session Equipment Utilized During Treatment: Gait belt General Behavior During Therapy: [facility] for tasks assessed/performed  Seth Bake, PTA 01/13/2013, 9:49 AM

## 2013-01-20 ENCOUNTER — Ambulatory Visit (HOSPITAL_COMMUNITY): Payer: Medicare Other | Admitting: Physical Therapy

## 2013-01-25 ENCOUNTER — Ambulatory Visit (HOSPITAL_COMMUNITY)
Admission: RE | Admit: 2013-01-25 | Discharge: 2013-01-25 | Disposition: A | Discharge status: Home / Self Care | Payer: Medicare Other | Source: Ambulatory Visit | Attending: Internal Medicine | Admitting: Internal Medicine

## 2013-01-25 DIAGNOSIS — IMO0001 Reserved for inherently not codable concepts without codable children: Secondary | ICD-10-CM | POA: Insufficient documentation

## 2013-01-25 DIAGNOSIS — R269 Unspecified abnormalities of gait and mobility: Secondary | ICD-10-CM | POA: Insufficient documentation

## 2013-01-25 DIAGNOSIS — M2560 Stiffness of unspecified joint, not elsewhere classified: Secondary | ICD-10-CM | POA: Insufficient documentation

## 2013-01-25 NOTE — Evaluation (Signed)
Physical Therapy Re-evaluation  Patient Details  Name: Derrick Sanchez MRN: [id number] Date of Birth: 20-Apr-1931  Today's Date: 01/25/2013 Time: 4098-1191 PT Time Calculation (min): 42 min Charges: MMT x 1 [phone]) Self care x 18' (0813-0831)  Manual x [phone])              Visit#: 8 of 8  Re-eval: 01/21/13 Assessment Diagnosis: abnormal gt Next MD Visit: 02/08/13  Authorization: BCBS medicare    Authorization Visit#: 8 of 10   Past Medical History:  Past Medical History  Diagnosis Date  . Obstructive sleep apnea (adult) (pediatric)   . Unspecified asthma   . Hyperparathyroidism, unspecified   . Other vitamin B12 deficiency anemia   . Restless legs syndrome (RLS)   . Esophageal reflux 2008    EGD  . Diverticulosis of colon (without mention of hemorrhage) 2008,2006,2003    Colonoscopy  . Glaucoma   . Internal hemorrhoids without mention of complication 2006    Colonoscopy  . Candidiasis of the esophagus 07-17-2000    EGD  . Stricture and stenosis of esophagus 05-27-2002    EGD  . Colon polyps 05-27-2002    Tubulovillous Adenoma-Colonoscopy  . Hiatal hernia 2002,2003,2013    EGD  . Allergy   . Arthritis   . Blood transfusion   . Cataract     bil eyes   Past Surgical History:  Past Surgical History  Procedure Laterality Date  . Parathyroidectomy    . Total knee arthroplasty      lt  . Hip surgery      rt  . Colonoscopy    . Eye surgery      both cataracts  . Mass excision  10/14/2011    Procedure: EXCISION MASS;  Surgeon: Nicki Reaper, MD;  Location: [facility];  Service: Orthopedics;  Laterality: Left;   left hand    Subjective Symptoms/Limitations Symptoms: Pt states that he can tell that his balance has improved. Pt would like to focus more on improving posture now. Pain Assessment Currently in Pain?: No/denies  Cognition/Observation Observation/Other Assessments Observations: forward bent trunk 15 degrees, head 3 inches  forward of shoulder (trunk was bent 15 degrees forward, head was 3.5" fwd 12/22/12)  Assessment RLE Strength Right Hip Flexion: 5/5 (was 5/5 on 12/22/12) Right Hip Extension:  (4+/5 was 4/5 on 12/22/12) Right Hip ABduction: 5/5 (was 5/5 on 12/22/12) Right Hip ADduction: 5/5 (was 5/5 on 12/22/12) Right Knee Flexion: 5/5 (was 5/5 on 12/22/12) Right Knee Extension: 5/5 (was 5/5 on 12/22/12) Right Ankle Dorsiflexion: 5/5 (was 3/5 on 12/22/12) LLE Strength Left Hip Flexion: 5/5 (was 5/5 on 12/22/12) Left Hip Extension:  (4+/5 was 3/5 on 12/22/12) Left Hip ABduction: 5/5 (was 5/5 on 12/22/12) Left Hip ADduction: 5/5 (was 5/5 on 12/22/12) Left Knee Flexion: 5/5 (was 5/5 on 12/22/12) Left Knee Extension: 5/5 (was 5/5 on 12/22/12) Left Ankle Dorsiflexion: 5/5 (was 3/5 on 12/22/12)  Exercise/Treatments Mobility/Balance  Static Standing Balance Single Leg Stance - Right Leg: 9 (was 3 on 12/22/12) Single Leg Stance - Left Leg: 10 (was 6 on 12/22/12) Tandem Stance - Right Leg: 35 (was 33 on 12/22/12) Tandem Stance - Left Leg: 60 (was 60 on 12/22/12)    Physical Therapy Assessment and Plan PT Assessment and Plan Clinical Impression Statement: Pt displays improvements in strength and balance. Posture has also improved. Pt states that he would like to focus on improving posture. Pt would benefit from continuing skilled PT to continue to progress toward  unmet goals. PT Plan: Recommend to continue skilled PT 2 x per week x 2 weeks.    Goals PT Short Term Goals PT Short Term Goal 1: Pt to hold head at onlly 10 degree Sidebend for improved posture. (15 degree) PT Short Term Goal 1 - Progress: Progressing toward goal PT Short Term Goal 2: Pt to stand with only 15 degree forward bend to show improvement of posture PT Short Term Goal 2 - Progress: Met PT Short Term Goal 3: Pt ear to be only 2 1/2 inches from shoulder to show improvement in posture PT Short Term Goal 3 - Progress: Progressing toward goal PT Short Term Goal 4: mm  strength improved 1/2 grade to allow increased stability. PT Short Term Goal 4 - Progress: Met PT Short Term Goal 5: no falls to decrease risk of injury PT Short Term Goal 5 - Progress: Met PT Long Term Goals Time to Complete Long Term Goals: 4 weeks PT Long Term Goal 1: pt to have no SB in cervical area at rest for improved posture PT Long Term Goal 1 - Progress: Progressing toward goal PT Long Term Goal 2: Pt to be able to stand erect without thinking of it,(natural standing state) to decrease back pain PT Long Term Goal 2 - Progress: Progressing toward goal Long Term Goal 3: Pt ear to be 2" in front of shoulder.  Long Term Goal 3 Progress: Progressing toward goal Long Term Goal 4: No falls in 4 weeks. Long Term Goal 4 Progress: Met PT Long Term Goal 5: I in advance HEP Long Term Goal 5 Progress: Met PT Long Term Goal 6: SLS to be 30 seconds to decrease risk of falling (Progressing toward goals) PT Long Term Goal 7: hip extensor stregth 5/5 (Progressing toward goal)  Problem List Patient Active Problem List   Diagnosis Date Noted  . Stiffness of joints, not elsewhere classified, multiple sites 12/22/2012  . Postural imbalance 12/22/2012  . Hx of falling 12/22/2012  . HYPERPARATHYROIDISM UNSPECIFIED 05/03/2010  . OBSTRUCTIVE SLEEP APNEA 05/03/2010  . GLAUCOMA 05/03/2010  . ASTHMA 05/03/2010  . ANEMIA, VITAMIN B12 DEFICIENCY 06/14/2007  . RESTLESS LEG SYNDROME 06/14/2007  . GASTROESOPHAGEAL REFLUX DISEASE 06/14/2007  . DIVERTICULOSIS, COLON 05/31/2007  . POLYP, COLON 05/27/2002    PT - End of Session Equipment Utilized During Treatment: Gait belt General Behavior During Therapy: [facility] for tasks assessed/performed PT Plan of Care PT Home Exercise Plan: Given for scapular tband exercises  GP Functional Assessment Tool Used: clinical judgement / ABC Functional Limitation: Self care Self Care Current Status (I6962): At least 20 percent but less than 40 percent impaired,  limited or restricted Self Care Goal Status ([id number]): At least 1 percent but less than 20 percent impaired, limited or restricted  Seth Bake, PTA  01/25/2013, 9:12 AM  Physician Documentation Your signature is required to indicate approval of the treatment plan as stated above.  Please sign and either send electronically or make a copy of this report for your files and return this physician signed original.   Please mark one 1.__approve of plan  2. ___approve of plan with the following conditions.   ______________________________  _____________________ Physician Signature                                                                                                             Date

## 2013-01-27 ENCOUNTER — Ambulatory Visit (HOSPITAL_COMMUNITY)
Admission: RE | Admit: 2013-01-27 | Discharge: 2013-01-27 | Disposition: A | Discharge status: Home / Self Care | Payer: Medicare Other | Source: Ambulatory Visit | Attending: Internal Medicine | Admitting: Internal Medicine

## 2013-01-27 NOTE — Progress Notes (Signed)
Physical Therapy Treatment Patient Details  Name: Derrick Sanchez MRN: [id number] Date of Birth: 01/21/1931  Today's Date: 01/27/2013 Time: 0801-0840 PT Time Calculation (min): 39 min  Visit#: 9 of 12  Re-eval: 01/21/13 Charges: Therex x 23' (0801-0824) Manual x 15' (0825-0840)  Authorization: [employer]BS medicare  Authorization Time Period:    Authorization Visit#: 9 of 18   Subjective: Symptoms/Limitations Symptoms: Pt reports continued HEP compliance. Pain Assessment Currently in Pain?: No/denies   Exercise/Treatments Seated Exercises Cervical Rotation: 10 reps;Both;Limitations Cervical Rotation Limitations: sitting on hands to avoid shoulder elevation Lateral Flexion: 10 reps;Both;Limitations Lateral Flexion Limitations: sitting on hands to avoid shoulder elevation W Back: 10 reps Sidelying Exercises Lateral Flexion: 10 reps;Left Other Sidelying Exercise: Rotation x 10 right Prone Exercises Other Prone Exercise: POE cervical rotation x 10  Manual Therapy Myofascial Release: MFR to right sternocleidomastoid in supine with passive left rotation and lateral flexion  Physical Therapy Assessment and Plan PT Assessment and Plan Clinical Impression Statement: Pt is progressing well but continues to require multimodal cueing to improve posture throughout session. Tx focus on improve posture. Began prone activities to stretch hip flexors. Myofascial release completed to right sternocleidomastoid decrease tightness and improve posture. Pt displays improve postural awareness overall. PT Plan: Continue to improve posture, gait and balance per PT POC.    Goals    Problem List Patient Active Problem List   Diagnosis Date Noted  . Stiffness of joints, not elsewhere classified, multiple sites 12/22/2012  . Postural imbalance 12/22/2012  . Hx of falling 12/22/2012  . HYPERPARATHYROIDISM UNSPECIFIED 05/03/2010  . OBSTRUCTIVE SLEEP APNEA 05/03/2010  . GLAUCOMA 05/03/2010  . ASTHMA  05/03/2010  . ANEMIA, VITAMIN B12 DEFICIENCY 06/14/2007  . RESTLESS LEG SYNDROME 06/14/2007  . GASTROESOPHAGEAL REFLUX DISEASE 06/14/2007  . DIVERTICULOSIS, COLON 05/31/2007  . POLYP, COLON 05/27/2002    PT - End of Session Equipment Utilized During Treatment: Gait belt General Behavior During Therapy: [facility] for tasks assessed/performed  Seth Bake, PTA  01/27/2013, 10:38 AM

## 2013-02-01 ENCOUNTER — Ambulatory Visit (HOSPITAL_COMMUNITY)
Admission: RE | Admit: 2013-02-01 | Discharge: 2013-02-01 | Disposition: A | Discharge status: Home / Self Care | Payer: Medicare Other | Source: Ambulatory Visit | Attending: *Deleted | Admitting: *Deleted

## 2013-02-01 NOTE — Progress Notes (Signed)
Physical Therapy Treatment Patient Details  Name: Derrick Sanchez MRN: [id number] Date of Birth: 01/04/1931  Today's Date: 02/01/2013 Time: 0800-0842 PT Time Calculation (min): 42 min  Visit#: 10 of 12  Re-eval: 01/21/13 Charges: Therex x 23'(0800-0823) Manual x 17'(0825-0842)  Authorization: BCBS medicare   Authorization Visit#: 10 of 18   Subjective: Symptoms/Limitations Symptoms: Pt states that he has tried lying on his stomach more often. Pain Assessment Currently in Pain?: No/denies   Exercise/Treatments Standing Exercises Other Standing Exercises: Standing against wall keeping correct postural alignment x 5' Sidelying Exercises Lateral Flexion: 10 reps;Left Other Sidelying Exercise: Rotation x 10 right Prone Exercises W Back: 10 reps Shoulder Extension: 10 reps Upper Extremity Flexion with Stabilization: Flexion;10 reps;Limitations UE Flexion with Stabilization Limitations: alternating Other Prone Exercise: POE cervical rotation x 10  Manual Therapy Myofascial Release: MFR to right sternocleidomastoid in supine with passive left lateral flexion  Physical Therapy Assessment and Plan PT Assessment and Plan Clinical Impression Statement: Treatment focus on improving posture. Pt requires frequent cueing throughout session to improve head alignment. Progressed prone exercises with minimal difficulty after initial cueing and demo. Manual techniques completed to right sternocleidomastoid to decrease tightness and improve head alignment. PT Plan: Continue to improve posture, gait and balance per PT POC.     Problem List Patient Active Problem List   Diagnosis Date Noted  . Stiffness of joints, not elsewhere classified, multiple sites 12/22/2012  . Postural imbalance 12/22/2012  . Hx of falling 12/22/2012  . HYPERPARATHYROIDISM UNSPECIFIED 05/03/2010  . OBSTRUCTIVE SLEEP APNEA 05/03/2010  . GLAUCOMA 05/03/2010  . ASTHMA 05/03/2010  . ANEMIA, VITAMIN B12 DEFICIENCY  06/14/2007  . RESTLESS LEG SYNDROME 06/14/2007  . GASTROESOPHAGEAL REFLUX DISEASE 06/14/2007  . DIVERTICULOSIS, COLON 05/31/2007  . POLYP, COLON 05/27/2002    PT - End of Session Equipment Utilized During Treatment: Gait belt General Behavior During Therapy: [facility] for tasks assessed/performed  Seth Bake, PTA  02/01/2013, 9:18 AM

## 2013-02-03 ENCOUNTER — Ambulatory Visit (HOSPITAL_COMMUNITY)
Admission: RE | Admit: 2013-02-03 | Discharge: 2013-02-03 | Disposition: A | Discharge status: Home / Self Care | Payer: Medicare Other | Source: Ambulatory Visit | Attending: Internal Medicine | Admitting: Internal Medicine

## 2013-02-03 DIAGNOSIS — Z9181 History of falling: Secondary | ICD-10-CM

## 2013-02-03 DIAGNOSIS — M2560 Stiffness of unspecified joint, not elsewhere classified: Secondary | ICD-10-CM

## 2013-02-03 DIAGNOSIS — R293 Abnormal posture: Secondary | ICD-10-CM

## 2013-02-03 NOTE — Progress Notes (Signed)
Physical Therapy Treatment Patient Details  Name: Derrick Sanchez MRN: [id number] Date of Birth: 1931-05-02  Today's Date: 02/03/2013 Time: 4696-2952 PT Time Calculation (min): 42 min Charge:   There ex x 14; neuromuscular re ex x 14; manual x 14 Visit#: 11 of 12  Re-eval: 01/21/13    Authorization: BCBS medicare  Authorization Visit#: 11 of 18   Subjective: Symptoms/Limitations Symptoms: Pt states he is not doing his exercies as much as he should be Pain Assessment Pain Score: 5  Pain Location: Back Pain Orientation: Lower    Exercise/Treatments        Machines for Strengthening UBE (Upper Arm Bike): 4' @ 1.0  Standing Exercises Other Standing Exercises: looking in mirror concentrating on posture Supine Exercises Other Supine Exercise: t-band decompression/postural ex 1-5 Sidelying Exercises Lateral Flexion: Left;10 reps Prone Exercises W Back: 10 reps Shoulder Extension: 10 reps Rows: 10 reps Hand Exercises for Cervical Radiculopathy    Manual Therapy Myofascial Release: MFR to sternocleidomastoid with PROM and jt mobs to increase ROM  Physical Therapy Assessment and Plan PT Assessment and Plan Clinical Impression Statement: treatment focused on stability of trunk as well as cervical ROM.  Pt balance is off when he rights is head due to years of walking with head sidebent to the right.   PT Plan: reassessment has been done on 8/13 continue with treatment.    Goals    Problem List Patient Active Problem List   Diagnosis Date Noted  . Stiffness of joints, not elsewhere classified, multiple sites 12/22/2012  . Postural imbalance 12/22/2012  . Hx of falling 12/22/2012  . HYPERPARATHYROIDISM UNSPECIFIED 05/03/2010  . OBSTRUCTIVE SLEEP APNEA 05/03/2010  . GLAUCOMA 05/03/2010  . ASTHMA 05/03/2010  . ANEMIA, VITAMIN B12 DEFICIENCY 06/14/2007  . RESTLESS LEG SYNDROME 06/14/2007  . GASTROESOPHAGEAL REFLUX DISEASE 06/14/2007  . DIVERTICULOSIS, COLON 05/31/2007   . POLYP, COLON 05/27/2002    PT Plan of Care PT Home Exercise Plan: new postural exercises using t-band , (supine)  GP    Odes Lolli,CINDY 02/03/2013, 9:04 AM

## 2013-02-08 ENCOUNTER — Ambulatory Visit (HOSPITAL_COMMUNITY): Payer: Medicare Other | Admitting: *Deleted

## 2013-02-10 ENCOUNTER — Ambulatory Visit (HOSPITAL_COMMUNITY): Payer: Medicare Other | Admitting: Physical Therapy

## 2013-03-07 ENCOUNTER — Other Ambulatory Visit: Payer: Self-pay | Admitting: Dermatology

## 2013-03-28 ENCOUNTER — Telehealth: Payer: Self-pay | Admitting: Pulmonary Disease

## 2013-03-28 NOTE — Telephone Encounter (Signed)
I spoke with pt and appt scheduled for pt. Nothing further needed

## 2013-03-30 ENCOUNTER — Ambulatory Visit: Payer: Medicare Other | Admitting: Pulmonary Disease

## 2013-04-16 ENCOUNTER — Emergency Department (HOSPITAL_COMMUNITY)
Admission: EM | Admit: 2013-04-16 | Discharge: 2013-04-16 | Disposition: A | Discharge status: Home / Self Care | Payer: Medicare Other | Attending: Emergency Medicine | Admitting: Emergency Medicine

## 2013-04-16 ENCOUNTER — Encounter (HOSPITAL_COMMUNITY): Payer: Self-pay | Admitting: Emergency Medicine

## 2013-04-16 ENCOUNTER — Emergency Department (HOSPITAL_COMMUNITY): Payer: Medicare Other

## 2013-04-16 DIAGNOSIS — Z860100 Personal history of colon polyps, unspecified: Secondary | ICD-10-CM | POA: Insufficient documentation

## 2013-04-16 DIAGNOSIS — Z8719 Personal history of other diseases of the digestive system: Secondary | ICD-10-CM | POA: Insufficient documentation

## 2013-04-16 DIAGNOSIS — Z8739 Personal history of other diseases of the musculoskeletal system and connective tissue: Secondary | ICD-10-CM | POA: Insufficient documentation

## 2013-04-16 DIAGNOSIS — Y9389 Activity, other specified: Secondary | ICD-10-CM | POA: Insufficient documentation

## 2013-04-16 DIAGNOSIS — S46909A Unspecified injury of unspecified muscle, fascia and tendon at shoulder and upper arm level, unspecified arm, initial encounter: Secondary | ICD-10-CM | POA: Insufficient documentation

## 2013-04-16 DIAGNOSIS — Z8679 Personal history of other diseases of the circulatory system: Secondary | ICD-10-CM | POA: Insufficient documentation

## 2013-04-16 DIAGNOSIS — Z862 Personal history of diseases of the blood and blood-forming organs and certain disorders involving the immune mechanism: Secondary | ICD-10-CM | POA: Insufficient documentation

## 2013-04-16 DIAGNOSIS — Z79899 Other long term (current) drug therapy: Secondary | ICD-10-CM | POA: Insufficient documentation

## 2013-04-16 DIAGNOSIS — Y9269 Other specified industrial and construction area as the place of occurrence of the external cause: Secondary | ICD-10-CM | POA: Insufficient documentation

## 2013-04-16 DIAGNOSIS — Z8639 Personal history of other endocrine, nutritional and metabolic disease: Secondary | ICD-10-CM | POA: Insufficient documentation

## 2013-04-16 DIAGNOSIS — S8002XA Contusion of left knee, initial encounter: Secondary | ICD-10-CM

## 2013-04-16 DIAGNOSIS — Z8619 Personal history of other infectious and parasitic diseases: Secondary | ICD-10-CM | POA: Insufficient documentation

## 2013-04-16 DIAGNOSIS — G2581 Restless legs syndrome: Secondary | ICD-10-CM | POA: Insufficient documentation

## 2013-04-16 DIAGNOSIS — S4980XA Other specified injuries of shoulder and upper arm, unspecified arm, initial encounter: Secondary | ICD-10-CM | POA: Insufficient documentation

## 2013-04-16 DIAGNOSIS — S8000XA Contusion of unspecified knee, initial encounter: Secondary | ICD-10-CM | POA: Insufficient documentation

## 2013-04-16 DIAGNOSIS — W010XXA Fall on same level from slipping, tripping and stumbling without subsequent striking against object, initial encounter: Secondary | ICD-10-CM | POA: Insufficient documentation

## 2013-04-16 DIAGNOSIS — J45909 Unspecified asthma, uncomplicated: Secondary | ICD-10-CM | POA: Insufficient documentation

## 2013-04-16 DIAGNOSIS — Z96659 Presence of unspecified artificial knee joint: Secondary | ICD-10-CM | POA: Insufficient documentation

## 2013-04-16 DIAGNOSIS — D518 Other vitamin B12 deficiency anemias: Secondary | ICD-10-CM | POA: Insufficient documentation

## 2013-04-16 NOTE — ED Provider Notes (Signed)
CSN: [id number]     Arrival date & time 04/16/13  1331 History   First MD Initiated Contact with Patient 04/16/13 1348     Chief Complaint  Patient presents with  . Knee Injury   (Consider location/radiation/quality/duration/timing/severity/associated sxs/prior Treatment) HPI 79  Y.o. Male wiith history of left knee replacment tripped and fell in garage this am.  States struck left knee and right upper arm.  No head or neck injury, no loc.  Patient ambulatory on knee, denies blood thinner.  Past Medical History  Diagnosis Date  . Obstructive sleep apnea (adult) (pediatric)   . Unspecified asthma(493.90)   . Hyperparathyroidism, unspecified   . Other vitamin B12 deficiency anemia   . Restless legs syndrome (RLS)   . Esophageal reflux 2008    EGD  . Diverticulosis of colon (without mention of hemorrhage) 2008,2006,2003    Colonoscopy  . Glaucoma   . Internal hemorrhoids without mention of complication 2006    Colonoscopy  . Candidiasis of the esophagus 07-17-2000    EGD  . Stricture and stenosis of esophagus 05-27-2002    EGD  . Colon polyps 05-27-2002    Tubulovillous Adenoma-Colonoscopy  . Hiatal hernia 2002,2003,2013    EGD  . Allergy   . Arthritis   . Blood transfusion   . Cataract     bil eyes   Past Surgical History  Procedure Laterality Date  . Parathyroidectomy    . Total knee arthroplasty      lt  . Hip surgery      rt  . Colonoscopy    . Eye surgery      both cataracts  . Mass excision  10/14/2011    Procedure: EXCISION MASS;  Surgeon: Nicki Reaper, MD;  Location: [facility];  Service: Orthopedics;  Laterality: Left;   left hand   Family History  Problem Relation Age of Onset  . Brain cancer      3 brothers  . Heart disease Father   . Heart disease Sister   . Colon cancer Neg Hx   . Esophageal cancer Neg Hx   . Rectal cancer Neg Hx   . Stomach cancer Neg Hx    History  Substance Use Topics  . Smoking status: Never Smoker   .  Smokeless tobacco: Never Used  . Alcohol Use: No    Review of Systems  All other systems reviewed and are negative.    Allergies  Review of patient's allergies indicates no known allergies.  Home Medications   Current Outpatient Rx  Name  Route  Sig  Dispense  Refill  . b complex vitamins tablet   Oral   Take 1 tablet by mouth daily.           . bimatoprost (LUMIGAN) 0.03 % ophthalmic solution      1 drop at bedtime.         . brimonidine (ALPHAGAN P) 0.1 % SOLN   Both Eyes   Place 1 drop into both eyes 2 (two) times daily.         . brinzolamide (AZOPT) 1 % ophthalmic suspension      1 drop 3 (three) times daily.         . cyanocobalamin (,VITAMIN B-12,) 1000 MCG/ML injection   Intramuscular   Inject 1,000 mcg into the muscle every 30 (thirty) days.           Marland Kitchen losartan (COZAAR) 50 MG tablet   Oral   Take  50 mg by mouth daily.         . pramipexole (MIRAPEX) 1 MG tablet   Oral   Take 2 mg by mouth 3 (three) times daily. Two by mouth 3 times a day          BP 129/62  Pulse 64  Temp(Src) 98 F (36.7 C) (Oral)  SpO2 94% Physical Exam  Nursing note and vitals reviewed. Constitutional: He is oriented to person, place, and time. He appears well-developed and well-nourished.  HENT:  Head: Normocephalic and atraumatic.  Right Ear: External ear normal.  Left Ear: External ear normal.  Nose: Nose normal.  Mouth/Throat: Oropharynx is clear and moist.  Eyes: Conjunctivae and EOM are normal. Pupils are equal, round, and reactive to light.  Neck: Normal range of motion. Neck supple.  Cardiovascular: Normal rate, regular rhythm and normal heart sounds.   Pulmonary/Chest: Effort normal.  Abdominal: Soft. Bowel sounds are normal.  Musculoskeletal:  No external signs of trauma noted, mild left anterior knee ttp, full arom of knee, hip, and ankle.  No other ttp noted of extremities.   Neurological: He is alert and oriented to person, place, and time. He  has normal reflexes.  Skin: Skin is warm and dry.  Psychiatric: He has a normal mood and affect. His behavior is normal. Judgment and thought content normal.    ED Course  Procedures (including critical care time) Labs Review Labs Reviewed - No data to display Imaging Review Dg Knee Complete 4 Views Left  04/16/2013   CLINICAL DATA:  Fall. Knee injury and pain. Previous knee arthroplasty.  EXAM: LEFT KNEE - COMPLETE 4+ VIEW  COMPARISON:  None.  FINDINGS: Total knee arthroplasty seen, with all 3 components in expected position. No evidence of hardware failure or loosening. No evidence of fracture or dislocation.  No evidence of knee joint effusion. Infrapatellar soft tissue swelling noted. Peripheral vascular calcification also demonstrated.  IMPRESSION: Mild infrapatellar soft tissue swelling. No evidence of fracture or dislocation.   Electronically Signed   By: Myles Rosenthal M.D.   On: 04/16/2013 14:25    EKG Interpretation   None       MDM   1. Knee contusion, left, initial encounter        Hilario Quarry, MD 04/16/13 1447

## 2013-04-16 NOTE — ED Notes (Signed)
Pt tripped in garage and struck left knee on concrete, now with swelling, pain and difficulty ambulating. Pain 10/10. Hx of total knee replacement. Also c/co pain in right arm, CMS intact.

## 2013-07-14 ENCOUNTER — Ambulatory Visit (INDEPENDENT_AMBULATORY_CARE_PROVIDER_SITE_OTHER): Payer: Medicare Other | Admitting: Gastroenterology

## 2013-07-14 ENCOUNTER — Encounter: Payer: Self-pay | Admitting: Gastroenterology

## 2013-07-14 VITALS — BP 166/80 | HR 72 | Ht 72.0 in | Wt 171.8 lb

## 2013-07-14 DIAGNOSIS — K224 Dyskinesia of esophagus: Secondary | ICD-10-CM

## 2013-07-14 DIAGNOSIS — K219 Gastro-esophageal reflux disease without esophagitis: Secondary | ICD-10-CM

## 2013-07-14 DIAGNOSIS — K5730 Diverticulosis of large intestine without perforation or abscess without bleeding: Secondary | ICD-10-CM

## 2013-07-14 MED ORDER — ESOMEPRAZOLE MAGNESIUM 40 MG PO CPDR: 40.0000 mg | DELAYED_RELEASE_CAPSULE | Freq: Every day | ORAL | Status: DC

## 2013-07-14 NOTE — Patient Instructions (Addendum)
We have sent the following medications to your pharmacy for you to pick up at your convenience: Nexium   Please call back and make a follow up appointment in three months with Dr. Hilarie Fredrickson

## 2013-07-14 NOTE — Progress Notes (Signed)
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  He has no symptoms of systemic sclerosis.  For some reason, he is not on PPI therapy at this time, and describes some recurrent reflux symptoms with intermittent dysphagia.  He last had endoscopy and dilation in July of 2013 and was noted to have a hiatal hernia and a probable peptic stricture.  Review of his previous manometry do seem consistent with esophageal dysmotility.  He denies lower GI or hepatobiliary problems.  He has chronic edema of his lower extremities managed by primary care.  His appetite is good and his weight is stable.  Current Medications, Allergies, Past Medical History, Past Surgical History, Family History and Social History were reviewed in [employer] record.  ROS: All systems were reviewed and are negative unless otherwise stated in the HPI.          Physical Exam: Blood pressure 166/80, pulse 72 and regular and weight 171 with a BMI of 23.3.  Chest is generally clear he appears to be in a regular rhythm without murmurs gallops or rubs.  His abdomen shows no organomegaly, masses or tenderness.  He does have a severe peripheral edema with +2 pitting in the left leg and +1 in the right leg.  There is no evidence of cellulitis or phlebitis.  Mental status is normal.    Assessment and Plan: This patient has had chronic GERD for many years and for some reason on PPI therapy at this time.  He does not feel like he needs esophageal dilatation at this time.  We have decided to try Nexium 40 mg every morning for several months to see how he does symptomatically.  I've scheduled him to see Dr. Hilarie Fredrickson in 3 months for followup and connected to see if he needs further dilations were high-resolution esophageal manometry which may be indicated to better clarify his chronic esophageal dysmotility.  Standard antireflux  regime also reviewed with patient.  His continue other medication per primary care as listed and reviewed.  Previous colonoscopies have shown evidence of mild sigmoid colon diverticulosis.

## 2013-07-15 ENCOUNTER — Other Ambulatory Visit: Payer: Self-pay | Admitting: Dermatology

## 2013-11-22 ENCOUNTER — Emergency Department (HOSPITAL_COMMUNITY): Payer: Medicare Other

## 2013-11-22 ENCOUNTER — Encounter (HOSPITAL_COMMUNITY): Payer: Self-pay | Admitting: Emergency Medicine

## 2013-11-22 ENCOUNTER — Emergency Department (HOSPITAL_COMMUNITY)
Admission: EM | Admit: 2013-11-22 | Discharge: 2013-11-22 | Disposition: A | Discharge status: Home / Self Care | Payer: Medicare Other | Attending: Emergency Medicine | Admitting: Emergency Medicine

## 2013-11-22 DIAGNOSIS — K219 Gastro-esophageal reflux disease without esophagitis: Secondary | ICD-10-CM | POA: Insufficient documentation

## 2013-11-22 DIAGNOSIS — S79929A Unspecified injury of unspecified thigh, initial encounter: Secondary | ICD-10-CM | POA: Diagnosis present

## 2013-11-22 DIAGNOSIS — J45909 Unspecified asthma, uncomplicated: Secondary | ICD-10-CM | POA: Insufficient documentation

## 2013-11-22 DIAGNOSIS — Z8679 Personal history of other diseases of the circulatory system: Secondary | ICD-10-CM | POA: Insufficient documentation

## 2013-11-22 DIAGNOSIS — Z8619 Personal history of other infectious and parasitic diseases: Secondary | ICD-10-CM | POA: Diagnosis not present

## 2013-11-22 DIAGNOSIS — S72109A Unspecified trochanteric fracture of unspecified femur, initial encounter for closed fracture: Secondary | ICD-10-CM | POA: Insufficient documentation

## 2013-11-22 DIAGNOSIS — S72112A Displaced fracture of greater trochanter of left femur, initial encounter for closed fracture: Secondary | ICD-10-CM

## 2013-11-22 DIAGNOSIS — Y929 Unspecified place or not applicable: Secondary | ICD-10-CM | POA: Diagnosis not present

## 2013-11-22 DIAGNOSIS — W06XXXA Fall from bed, initial encounter: Secondary | ICD-10-CM | POA: Insufficient documentation

## 2013-11-22 DIAGNOSIS — S46909A Unspecified injury of unspecified muscle, fascia and tendon at shoulder and upper arm level, unspecified arm, initial encounter: Secondary | ICD-10-CM | POA: Insufficient documentation

## 2013-11-22 DIAGNOSIS — H409 Unspecified glaucoma: Secondary | ICD-10-CM | POA: Diagnosis not present

## 2013-11-22 DIAGNOSIS — Z79899 Other long term (current) drug therapy: Secondary | ICD-10-CM | POA: Insufficient documentation

## 2013-11-22 DIAGNOSIS — S4980XA Other specified injuries of shoulder and upper arm, unspecified arm, initial encounter: Secondary | ICD-10-CM | POA: Diagnosis not present

## 2013-11-22 DIAGNOSIS — S79919A Unspecified injury of unspecified hip, initial encounter: Secondary | ICD-10-CM | POA: Diagnosis present

## 2013-11-22 DIAGNOSIS — Y9389 Activity, other specified: Secondary | ICD-10-CM | POA: Insufficient documentation

## 2013-11-22 DIAGNOSIS — Z860100 Personal history of colon polyps, unspecified: Secondary | ICD-10-CM | POA: Insufficient documentation

## 2013-11-22 DIAGNOSIS — M129 Arthropathy, unspecified: Secondary | ICD-10-CM | POA: Diagnosis not present

## 2013-11-22 DIAGNOSIS — Z9849 Cataract extraction status, unspecified eye: Secondary | ICD-10-CM | POA: Diagnosis not present

## 2013-11-22 DIAGNOSIS — D518 Other vitamin B12 deficiency anemias: Secondary | ICD-10-CM | POA: Diagnosis not present

## 2013-11-22 LAB — CBC WITH DIFFERENTIAL/PLATELET
Basophils Absolute: 0 10*3/uL (ref 0.0–0.1)
Basophils Relative: 0 % (ref 0–1)
Eosinophils Absolute: 0.1 10*3/uL (ref 0.0–0.7)
Eosinophils Relative: 1 % (ref 0–5)
HCT: 36.4 % — ABNORMAL LOW (ref 39.0–52.0)
Hemoglobin: 12.1 g/dL — ABNORMAL LOW (ref 13.0–17.0)
LYMPHS ABS: 1.2 10*3/uL (ref 0.7–4.0)
LYMPHS PCT: 17 % (ref 12–46)
MCH: 31.3 pg (ref 26.0–34.0)
MCHC: 33.2 g/dL (ref 30.0–36.0)
MCV: 94.1 fL (ref 78.0–100.0)
Monocytes Absolute: 0.5 10*3/uL (ref 0.1–1.0)
Monocytes Relative: 7 % (ref 3–12)
NEUTROS ABS: 5.5 10*3/uL (ref 1.7–7.7)
NEUTROS PCT: 75 % (ref 43–77)
PLATELETS: 114 10*3/uL — AB (ref 150–400)
RBC: 3.87 MIL/uL — AB (ref 4.22–5.81)
RDW: 13.7 % (ref 11.5–15.5)
WBC: 7.3 10*3/uL (ref 4.0–10.5)

## 2013-11-22 LAB — BASIC METABOLIC PANEL
BUN: 36 mg/dL — ABNORMAL HIGH (ref 6–23)
CALCIUM: 9.1 mg/dL (ref 8.4–10.5)
CO2: 17 mEq/L — ABNORMAL LOW (ref 19–32)
CREATININE: 1.22 mg/dL (ref 0.50–1.35)
Chloride: 109 mEq/L (ref 96–112)
GFR, EST AFRICAN AMERICAN: 62 mL/min — AB (ref >90)
GFR, EST NON AFRICAN AMERICAN: 54 mL/min — AB (ref >90)
Glucose, Bld: 103 mg/dL — ABNORMAL HIGH (ref 70–99)
Potassium: 3.9 mEq/L (ref 3.7–5.3)
SODIUM: 141 meq/L (ref 137–147)

## 2013-11-22 LAB — ABO/RH: ABO/RH(D): O NEG

## 2013-11-22 LAB — TYPE AND SCREEN
ABO/RH(D): O NEG
ANTIBODY SCREEN: NEGATIVE

## 2013-11-22 LAB — PROTIME-INR
INR: 1.06 (ref 0.00–1.49)
PROTHROMBIN TIME: 13.6 s (ref 11.6–15.2)

## 2013-11-22 MED ORDER — HYDROMORPHONE HCL PF 1 MG/ML IJ SOLN
0.5000 mg | INTRAMUSCULAR | Status: DC | PRN
  Administered 2013-11-22 (×2): 0.5 mg via INTRAVENOUS
  Filled 2013-11-22 (×2): qty 1

## 2013-11-22 MED ORDER — ONDANSETRON HCL 4 MG/2ML IJ SOLN
4.0000 mg | Freq: Once | INTRAMUSCULAR | Status: AC
  Administered 2013-11-22: 4 mg via INTRAVENOUS
  Filled 2013-11-22: qty 2

## 2013-11-22 MED ORDER — OXYCODONE-ACETAMINOPHEN 5-325 MG PO TABS: 1.0000 | ORAL_TABLET | ORAL | Status: DC | PRN

## 2013-11-22 MED ORDER — SODIUM CHLORIDE 0.9 % IV SOLN
1000.0000 mL | INTRAVENOUS | Status: DC
  Administered 2013-11-22: 1000 mL via INTRAVENOUS

## 2013-11-22 NOTE — ED Notes (Signed)
Pt c/o L shoulder and L hip pain after rolling out of bed this morning.  Pain score 10/10.  Pt reports taking a sleeping pill last night.  Pt reports difficulty w/ ambulation since fall.

## 2013-11-22 NOTE — ED Provider Notes (Addendum)
CSN: [id number]     Arrival date & time 11/22/13  1019 History   First MD Initiated Contact with Patient 11/22/13 1042     Chief Complaint  Patient presents with  . Fall  . Hip Pain  . Shoulder Pain     The history is provided by the patient.   patient reports falling out of his bed this morning under his left side.  He reports left hip and left shoulder pain.  The majority of his pain is in his left hip.  His pain is 10 out 10 at this time.  Pain is worse movement and range of motion of his left hip and left shoulder.  No head injury.  No neck pain.  No chest pain.  No shortness of breath.  No recent illness.  No other complaints.  Patient was brought to the emergency apartment by his wife via private vehicle  Past Medical History  Diagnosis Date  . Obstructive sleep apnea (adult) (pediatric)   . Unspecified asthma(493.90)   . Hyperparathyroidism, unspecified   . Other vitamin B12 deficiency anemia   . Restless legs syndrome (RLS)   . Esophageal reflux 2008    EGD  . Diverticulosis of colon (without mention of hemorrhage) 2008,2006,2003    Colonoscopy  . Glaucoma   . Internal hemorrhoids without mention of complication 3244    Colonoscopy  . Candidiasis of the esophagus 07-17-2000    EGD  . Stricture and stenosis of esophagus 05-27-2002    EGD  . Colon polyps 05-27-2002    Tubulovillous Adenoma-Colonoscopy  . Hiatal hernia 2002,2003,2013    EGD  . Allergy   . Arthritis   . Blood transfusion   . Cataract     bil eyes   Past Surgical History  Procedure Laterality Date  . Parathyroidectomy    . Total knee arthroplasty Left   . Hip surgery Right   . Colonoscopy    . Cataract extraction Bilateral   . Mass excision  10/14/2011    Procedure: EXCISION MASS;  Surgeon: Wynonia Sours, MD;  Location: [place];  Service: Orthopedics;  Laterality: Left;   left hand  . Arm surgery     Family History  Problem Relation Age of Onset  . Brain cancer      3 brothers   . Heart disease Father   . Heart disease Sister   . Colon cancer Neg Hx   . Esophageal cancer Neg Hx   . Rectal cancer Neg Hx   . Stomach cancer Neg Hx   . Leukemia Brother   . Diabetes Neg Hx   . Kidney disease Neg Hx   . Liver disease Neg Hx    History  Substance Use Topics  . Smoking status: Never Smoker   . Smokeless tobacco: Never Used  . Alcohol Use: No    Review of Systems  All other systems reviewed and are negative.     Allergies  Review of patient's allergies indicates no known allergies.  Home Medications   Prior to Admission medications   Medication Sig Start Date End Date Taking? Authorizing Provider  b complex vitamins tablet Take 1 tablet by mouth every morning.    Yes Historical Provider, MD  bimatoprost (LUMIGAN) 0.03 % ophthalmic solution Place 1 drop into both eyes at bedtime.    Yes Historical Provider, MD  brinzolamide (AZOPT) 1 % ophthalmic suspension Place 1 drop into both eyes 3 (three) times daily.    Yes  Historical Provider, MD  cyanocobalamin (,VITAMIN B-12,) 1000 MCG/ML injection Inject 1,000 mcg into the muscle every 30 (thirty) days.     Yes Historical Provider, MD  losartan (COZAAR) 50 MG tablet Take 50 mg by mouth at bedtime.    Yes Historical Provider, MD  pramipexole (MIRAPEX) 1 MG tablet Take 2 mg by mouth 3 (three) times daily.    Yes Historical Provider, MD  oxyCODONE-acetaminophen (PERCOCET/ROXICET) 5-325 MG per tablet Take 1 tablet by mouth every 4 (four) hours as needed for severe pain. 11/22/13   Hoy Morn, MD   BP 139/81  Pulse 64  Temp(Src) 97.8 F (36.6 C) (Oral)  Resp 16  SpO2 96% Physical Exam  Nursing note and vitals reviewed. Constitutional: He is oriented to person, place, and time. He appears well-developed and well-nourished.  HENT:  Head: Normocephalic and atraumatic.  Eyes: EOM are normal.  Neck: Normal range of motion.  Cardiovascular: Normal rate, regular rhythm, normal heart sounds and intact distal  pulses.   Pulmonary/Chest: Effort normal and breath sounds normal. No respiratory distress.  Abdominal: Soft. He exhibits no distension. There is no tenderness.  Musculoskeletal: Normal range of motion.  Tenderness of left a.c. joint as well as left anterior proximal femurs.  No obvious deformity.  Patient with limited range of motion secondary to pain of the left shoulder.  No obvious left clavicle deformity.  Patient with pain with range of motion of left hip.  Normal left foot pulses  Neurological: He is alert and oriented to person, place, and time.  Skin: Skin is warm and dry.  Psychiatric: He has a normal mood and affect. Judgment normal.    ED Course  Procedures (including critical care time) Labs Review Labs Reviewed  BASIC METABOLIC PANEL - Abnormal; Notable for the following:    CO2 17 (*)    Glucose, Bld 103 (*)    BUN 36 (*)    GFR calc non Af Amer 54 (*)    GFR calc Af Amer 62 (*)    All other components within normal limits  CBC WITH DIFFERENTIAL - Abnormal; Notable for the following:    RBC 3.87 (*)    Hemoglobin 12.1 (*)    HCT 36.4 (*)    Platelets 114 (*)    All other components within normal limits  PROTIME-INR  TYPE AND SCREEN  ABO/RH    Imaging Review Dg Chest 1 View  11/22/2013   CLINICAL DATA:  Pain post trauma  EXAM: CHEST - 1 VIEW  COMPARISON:  Chest radiograph and chest CT Oct 23, 2006  FINDINGS: There is mild atelectatic change in the left base. There is no edema or consolidation. The heart size is within normal limits. Pulmonary vascular is within normal limits. No pneumothorax. No bone lesions.  IMPRESSION: Left base atelectatic change. No edema or consolidation. No pneumothorax.   Electronically Signed   By: Lowella Grip M.D.   On: 11/22/2013 11:26   Dg Hip Complete Left  11/22/2013   CLINICAL DATA:  Fall.  Left hip pain.  EXAM: LEFT HIP - COMPLETE 2+ VIEW  COMPARISON:  09/09/2013.  FINDINGS: Right total hip arthroplasty is present. No acute  displaced pelvic ring fracture. Degenerative sclerosis is present at L5-S1. Left hip joint space appears preserved. Calcific tendinitis of the left gluteal tendons. The does appear to be soft tissue fullness over the left hip with loss of normal fatty striations in the muscle, suggesting contusion/hematoma.  IMPRESSION: No acute osseous abnormality. Probable  left gluteal soft tissue hematoma.   Electronically Signed   By: Dereck Ligas M.D.   On: 11/22/2013 11:25   Ct Hip Left Wo Contrast  11/22/2013   CLINICAL DATA:  Severe left hip pain beginning this morning.  EXAM: CT OF THE LEFT HIP WITHOUT CONTRAST  TECHNIQUE: Multidetector CT imaging was performed according to the standard protocol. Multiplanar CT image reconstructions were also generated.  COMPARISON:  Plain films left hip 11/22/2013 at 11:38 a.m.  FINDINGS: The patient has acute fracture of the left greater trochanter with mild medial and superior displacement. No other fracture is identified. The left hip is located. There is no notable degenerative change about the left hip. Hematoma in association with the patient's fracture is noted. Intrapelvic contents demonstrate large volume of stool in the visualized colon. Sigmoid diverticular disease is also seen.  IMPRESSION: Acute, mildly displaced fracture of the left greater trochanter with associated hematoma.  Sigmoid diverticulosis. Large volume of stool in the rectosigmoid colon also noted.   Electronically Signed   By: Inge Rise M.D.   On: 11/22/2013 12:52   Dg Shoulder Left  11/22/2013   CLINICAL DATA:  Status post fall with left shoulder pain  EXAM: LEFT SHOULDER - 2+ VIEW  COMPARISON:  Left shoulder dated March 06, 2009  FINDINGS: The bones are osteopenic. There is no acute fracture nor dislocation. Mild degenerative change is present. The observed portions of the left clavicle and upper left ribs are normal.  IMPRESSION: There is no acute bony abnormality of the left shoulder.    Electronically Signed   By: David  Martinique   On: 11/22/2013 11:25   I personally reviewed the imaging tests through PACS system I reviewed available ER/hospitalization records through the EMR   EKG Interpretation   Date/Time:  Tuesday November 22 2013 11:24:06 EDT Ventricular Rate:  61 PR Interval:  179 QRS Duration: 112 QT Interval:  464 QTC Calculation: 467 R Axis:   22 Text Interpretation:  Sinus rhythm Borderline intraventricular conduction  delay Nonspecific T abnormalities, lateral leads Baseline wander in  lead(s) V5 No significant change was found Confirmed by Masaye Gatchalian  MD, Harsimran Westman  ([id number]) on 11/23/2013 8:01:58 AM      MDM   Final diagnoses:  Fracture of greater trochanter of left femur    I discussed the patient's case with Dr. Kathryne Hitch orthopedic surgery who personally reviewed his CT scan from the office.  It is felt this is focused to the greater trochanter and the patient can be weightbearing as tolerated on left side.  He will see the patient in the office in one week.  Patient has a walker at home.  Her family with his walker.  Left shoulder x-rays are negative.  Discharge home in good condition.  He understands to return to the ER for new or worsening symptoms.  His pain is significantly improved.    Hoy Morn, MD 11/22/13 1639  8:02 AM 11/24/2103 Chart amended to update ecg only  Hoy Morn, MD 11/23/13 [phone]

## 2013-11-22 NOTE — ED Notes (Signed)
Pt transported to CT ?

## 2013-11-22 NOTE — ED Notes (Signed)
Bed: CE02 Expected date:  Expected time:  Means of arrival:  Comments: Hold for triage 4

## 2013-11-22 NOTE — ED Notes (Addendum)
Pt reports falling off of bed this am resulting in left hip and shoulder pain. Pt able to move extremities. On assessment, pitting edema noted to left foot. Pt reports this is norm from copperhead bite years ago. Pt pedal pulses present and good cap refill.  No deformity noted.

## 2013-11-22 NOTE — Progress Notes (Signed)
  CARE MANAGEMENT ED NOTE 11/22/2013  Patient:  Derrick Sanchez, Derrick Sanchez   Account Number:  [id number]  Date Initiated:  11/22/2013  Documentation initiated by:  Jackelyn Poling  Subjective/Objective Assessment:   80 yr old [employer] [place] resident c/o left hip and shoulder pain s/p fall out of bed this morning PMH asthma, bilateral cataract, diverticulosis, RLS     Subjective/Objective Assessment Detail:   pcp [facility]  Pt reports having the following DME at home walker, cane, crutch, w/c  Wife good support system  Pt states he prefers not to have home health assistance but will contact pcp if needed "I think I will be okay"     Action/Plan:   CM reviewed EPIC notes, labs, imaging, Spoke with Dr Venora Maples, Spoke with pt & wife and assessed for needs Provided with a list of [place] home health agencies and private duty nursing agencies to assist with choice prn   Action/Plan Detail:   Anticipated DC Date:  11/22/2013     Status Recommendation to Physician:   Result of Recommendation:    Other ED [facility]  Other    Choice offered to / List presented to:            Status of service:  Completed, signed off  ED Comments:   ED Comments Detail:

## 2013-11-22 NOTE — ED Notes (Addendum)
Pt transported to xray on arrival to room. Will assess and draw labs upon arrival.

## 2013-12-05 ENCOUNTER — Other Ambulatory Visit (HOSPITAL_COMMUNITY): Payer: Self-pay | Admitting: Internal Medicine

## 2013-12-05 DIAGNOSIS — M7989 Other specified soft tissue disorders: Secondary | ICD-10-CM

## 2013-12-06 ENCOUNTER — Ambulatory Visit (HOSPITAL_COMMUNITY)
Admission: RE | Admit: 2013-12-06 | Discharge: 2013-12-06 | Disposition: A | Discharge status: Home / Self Care | Payer: Medicare Other | Source: Ambulatory Visit | Attending: Internal Medicine | Admitting: Internal Medicine

## 2013-12-06 DIAGNOSIS — M7989 Other specified soft tissue disorders: Secondary | ICD-10-CM | POA: Insufficient documentation

## 2014-01-30 ENCOUNTER — Other Ambulatory Visit: Payer: Self-pay | Admitting: Neurosurgery

## 2014-01-31 ENCOUNTER — Encounter (HOSPITAL_COMMUNITY): Payer: Self-pay | Admitting: Pharmacy Technician

## 2014-01-31 NOTE — Pre-Procedure Instructions (Addendum)
Derrick Sanchez  01/31/2014   Your procedure is scheduled on: Monday, August 24.  Report to [facility] Admitting at 9:30 AM.  Call this number if you have problems the morning of surgery: [phone]   Remember:   Do not eat food or drink liquids after midnight Sunday, August 23.  Take these medicines the morning of surgery with A SIP OF WATER: pramipexole (MIRAPEX).               Stop taking Vitamins,Aspirin,Nsaids,  and Herbal medication 7 days prior to surgery.   Do not wear jewelry.  Do not wear lotions, powders, or colognes.    Men may shave face and neck.  Do not bring valuables to the hospital.               [facility] is not responsible for any belongings or valuables.               Contacts, dentures or bridgework may not be worn into surgery.  Leave suitcase in the car. After surgery it may be brought to your room.  For patients admitted to the hospital, discharge time is determined by your treatment team.                 Special Instructions: [facility] - Preparing for Surgery  Before surgery, you can play an important role.  Because skin is not sterile, your skin needs to be as free of germs as possible.  You can reduce the number of germs on you skin by washing with CHG (chlorahexidine gluconate) soap before surgery.  CHG is an antiseptic cleaner which kills germs and bonds with the skin to continue killing germs even after washing.  Please DO NOT use if you have an allergy to CHG or antibacterial soaps.  If your skin becomes reddened/irritated stop using the CHG and inform your nurse when you arrive at Short Stay.  Do not shave (including legs and underarms) for at least 48 hours prior to the first CHG shower.  You may shave your face.  Please follow these instructions carefully:   1.  Shower with CHG Soap the night before surgery and the                                morning of Surgery.  2.  If you choose to wash your hair, wash your hair first as  usual with your       normal shampoo.  3.  After you shampoo, rinse your hair and body thoroughly to remove the                      Shampoo.  4.  Use CHG as you would any other liquid soap.  You can apply chg directly       to the skin and wash gently with scrungie or a clean washcloth.  5.  Apply the CHG Soap to your body ONLY FROM THE NECK DOWN.        Do not use on open wounds or open sores.  Avoid contact with your eyes,       ears, mouth and genitals (private parts).  Wash genitals (private parts)       with your normal soap.  6.  Wash thoroughly, paying special attention to the area where your surgery        will be performed.  7.  Thoroughly rinse your body with warm water from the neck down.  8.  DO NOT shower/wash with your normal soap after using and rinsing off       the CHG Soap.  9.  Pat yourself dry with a clean towel.            10.  Wear clean pajamas.            11.  Place clean sheets on your bed the night of your first shower and do not        sleep with pets.  Day of Surgery  Do not apply any lotions/deoderants the morning of surgery.  Please wear clean clothes to the hospital/surgery center.      Please read over the following fact sheets that you were given: Pain Booklet, Coughing and Deep Breathing, Blood Transfusion Information and Surgical Site Infection Prevention

## 2014-02-01 ENCOUNTER — Encounter (HOSPITAL_COMMUNITY): Payer: Self-pay

## 2014-02-01 ENCOUNTER — Encounter (HOSPITAL_COMMUNITY)
Admission: RE | Admit: 2014-02-01 | Discharge: 2014-02-01 | Disposition: A | Discharge status: Home / Self Care | Payer: Medicare Other | Source: Ambulatory Visit | Attending: Neurosurgery | Admitting: Neurosurgery

## 2014-02-01 ENCOUNTER — Encounter (HOSPITAL_COMMUNITY)
Admission: RE | Admit: 2014-02-01 | Discharge: 2014-02-01 | Disposition: A | Discharge status: Home / Self Care | Payer: Medicare Other | Source: Ambulatory Visit | Attending: Anesthesiology | Admitting: Anesthesiology

## 2014-02-01 ENCOUNTER — Other Ambulatory Visit: Payer: Self-pay | Admitting: Neurosurgery

## 2014-02-01 DIAGNOSIS — J984 Other disorders of lung: Secondary | ICD-10-CM | POA: Diagnosis not present

## 2014-02-01 DIAGNOSIS — R911 Solitary pulmonary nodule: Secondary | ICD-10-CM | POA: Diagnosis present

## 2014-02-01 HISTORY — DX: Pneumonia, unspecified organism: J18.9

## 2014-02-01 HISTORY — DX: Essential (primary) hypertension: I10

## 2014-02-01 LAB — BASIC METABOLIC PANEL
Anion gap: 13 (ref 5–15)
BUN: 27 mg/dL — ABNORMAL HIGH (ref 6–23)
CO2: 23 meq/L (ref 19–32)
Calcium: 9 mg/dL (ref 8.4–10.5)
Chloride: 104 mEq/L (ref 96–112)
Creatinine, Ser: 1.19 mg/dL (ref 0.50–1.35)
GFR calc Af Amer: 64 mL/min — ABNORMAL LOW (ref >90)
GFR, EST NON AFRICAN AMERICAN: 55 mL/min — AB (ref >90)
Glucose, Bld: 102 mg/dL — ABNORMAL HIGH (ref 70–99)
Potassium: 4.1 mEq/L (ref 3.7–5.3)
Sodium: 140 mEq/L (ref 137–147)

## 2014-02-01 LAB — CBC
HEMATOCRIT: 37.3 % — AB (ref 39.0–52.0)
Hemoglobin: 12.3 g/dL — ABNORMAL LOW (ref 13.0–17.0)
MCH: 31.1 pg (ref 26.0–34.0)
MCHC: 33 g/dL (ref 30.0–36.0)
MCV: 94.4 fL (ref 78.0–100.0)
PLATELETS: 160 10*3/uL (ref 150–400)
RBC: 3.95 MIL/uL — AB (ref 4.22–5.81)
RDW: 13.3 % (ref 11.5–15.5)
WBC: 6.3 10*3/uL (ref 4.0–10.5)

## 2014-02-01 LAB — TYPE AND SCREEN
ABO/RH(D): O NEG
Antibody Screen: NEGATIVE

## 2014-02-01 LAB — SURGICAL PCR SCREEN
MRSA, PCR: NEGATIVE
Staphylococcus aureus: POSITIVE — AB

## 2014-02-01 NOTE — Progress Notes (Signed)
Prescription for Mupirocin 2% ointment called to patient's pharmacy at [phone], unable to reach pt at this time, will continue to make follow-up calls.

## 2014-02-02 ENCOUNTER — Ambulatory Visit (HOSPITAL_COMMUNITY)
Admission: RE | Admit: 2014-02-02 | Discharge: 2014-02-02 | Disposition: A | Discharge status: Home / Self Care | Payer: Medicare Other | Source: Ambulatory Visit | Attending: Neurosurgery | Admitting: Neurosurgery

## 2014-02-02 ENCOUNTER — Other Ambulatory Visit (HOSPITAL_COMMUNITY): Payer: Self-pay | Admitting: Neurosurgery

## 2014-02-02 DIAGNOSIS — J984 Other disorders of lung: Secondary | ICD-10-CM | POA: Insufficient documentation

## 2014-02-02 DIAGNOSIS — R911 Solitary pulmonary nodule: Secondary | ICD-10-CM

## 2014-02-02 NOTE — Progress Notes (Signed)
Contacted pt's wife this am and notified her of positive PCR of staph. Instructed her to pick up Rx from pharmacy and have him use it twice a day for 5 day. She voiced understanding.

## 2014-02-02 NOTE — Progress Notes (Signed)
Anesthesia Chart Review:  Patient is an 80 year old male scheduled for L4-5 decompression, PLIF on 02/06/14 by Dr. Sherwood Gambler.   History includes non-smoker, OSA without CPAP, asthma, hyperparathyroidism s/p parathyroidectomy, RLS, GERD, diverticulosis, glaucoma, esophogeal stricture, hiatal hernia, HTN, cataract extraction, arthritis, left TKA, right THA. PCP is listed as Dr. [name].  EKG on 11/22/13 showed: SR, borderline IVCD, non-specific T wave abnormality in lateral leads.  Baseline wanderer in V5. EKG was not felt significantly changes when compared to prior tracing on 01/06/07 (see Muse).  CXR on 02/01/14 showed: There is a 1.2 x 1.1 cm nodular opacity in the right upper lobe slightly lateral to the superior right hilum. Advise noncontrast enhanced chest CT to further assess. Underlying emphysematous change with scattered areas of scarring. No edema or consolidation.  Due to his abnormal CXR report, Dr. Sherwood Gambler ordered a chest CT which was done on 02/02/14 and showed: No suspicious pulmonary nodule or mass. Chest x-ray finding appears to correlate with sclerotic irregular old right rib fracture. Hyperinflation. Chronic subpleural parenchymal scarring. Remote granulomatous disease.  Preoperative labs noted.    PAT testing results appears acceptable for OR. Further evaluation by his assigned anesthesiologist on the day of surgery. If no acute changes then I would anticipate that he could proceed as planned.  George Hugh [facility]/Anesthesiology Phone [phone] 02/02/2014 2:48 PM

## 2014-02-05 MED ORDER — CEFAZOLIN SODIUM-DEXTROSE 2-3 GM-% IV SOLR
2.0000 g | INTRAVENOUS | Status: AC
  Administered 2014-02-06: 2 g via INTRAVENOUS
  Filled 2014-02-05: qty 50

## 2014-02-06 ENCOUNTER — Inpatient Hospital Stay (HOSPITAL_COMMUNITY): Payer: Medicare Other | Admitting: Anesthesiology

## 2014-02-06 ENCOUNTER — Encounter (HOSPITAL_COMMUNITY)
Admission: RE | Disposition: A | Discharge status: Home / Self Care | Payer: Self-pay | Source: Ambulatory Visit | Attending: Neurosurgery

## 2014-02-06 ENCOUNTER — Inpatient Hospital Stay (HOSPITAL_COMMUNITY): Payer: Medicare Other

## 2014-02-06 ENCOUNTER — Encounter (HOSPITAL_COMMUNITY): Payer: Medicare Other | Admitting: Vascular Surgery

## 2014-02-06 ENCOUNTER — Encounter (HOSPITAL_COMMUNITY): Payer: Self-pay | Admitting: Surgery

## 2014-02-06 ENCOUNTER — Inpatient Hospital Stay (HOSPITAL_COMMUNITY)
Admission: RE | Admit: 2014-02-06 | Discharge: 2014-02-08 | DRG: 460 | Disposition: A | Discharge status: Home / Self Care | Payer: Medicare Other | Source: Ambulatory Visit | Attending: Neurosurgery | Admitting: Neurosurgery

## 2014-02-06 DIAGNOSIS — G2581 Restless legs syndrome: Secondary | ICD-10-CM | POA: Diagnosis present

## 2014-02-06 DIAGNOSIS — H409 Unspecified glaucoma: Secondary | ICD-10-CM | POA: Diagnosis present

## 2014-02-06 DIAGNOSIS — G4733 Obstructive sleep apnea (adult) (pediatric): Secondary | ICD-10-CM | POA: Diagnosis present

## 2014-02-06 DIAGNOSIS — J45909 Unspecified asthma, uncomplicated: Secondary | ICD-10-CM | POA: Diagnosis present

## 2014-02-06 DIAGNOSIS — E213 Hyperparathyroidism, unspecified: Secondary | ICD-10-CM | POA: Diagnosis present

## 2014-02-06 DIAGNOSIS — Z96659 Presence of unspecified artificial knee joint: Secondary | ICD-10-CM

## 2014-02-06 DIAGNOSIS — M48062 Spinal stenosis, lumbar region with neurogenic claudication: Principal | ICD-10-CM | POA: Diagnosis present

## 2014-02-06 DIAGNOSIS — M545 Low back pain, unspecified: Secondary | ICD-10-CM | POA: Diagnosis present

## 2014-02-06 DIAGNOSIS — K219 Gastro-esophageal reflux disease without esophagitis: Secondary | ICD-10-CM | POA: Diagnosis present

## 2014-02-06 DIAGNOSIS — D518 Other vitamin B12 deficiency anemias: Secondary | ICD-10-CM | POA: Diagnosis present

## 2014-02-06 HISTORY — DX: Repeated falls: R29.6

## 2014-02-06 LAB — POCT I-STAT 4, (NA,K, GLUC, HGB,HCT)
Glucose, Bld: 105 mg/dL — ABNORMAL HIGH (ref 70–99)
HCT: 26 % — ABNORMAL LOW (ref 39.0–52.0)
Hemoglobin: 8.8 g/dL — ABNORMAL LOW (ref 13.0–17.0)
Potassium: 4.2 mEq/L (ref 3.7–5.3)
Sodium: 140 mEq/L (ref 137–147)

## 2014-02-06 SURGERY — POSTERIOR LUMBAR FUSION 1 LEVEL
Anesthesia: General | Site: Spine Lumbar

## 2014-02-06 MED ORDER — OXYCODONE-ACETAMINOPHEN 5-325 MG PO TABS: 1.0000 | ORAL_TABLET | ORAL | Status: DC | PRN

## 2014-02-06 MED ORDER — MUPIROCIN 2 % EX OINT
TOPICAL_OINTMENT | Freq: Two times a day (BID) | CUTANEOUS | Status: AC
  Administered 2014-02-06: 23:00:00 via NASAL
  Filled 2014-02-06: qty 22

## 2014-02-06 MED ORDER — PROPOFOL 10 MG/ML IV BOLUS
INTRAVENOUS | Status: AC
  Filled 2014-02-06: qty 20

## 2014-02-06 MED ORDER — SODIUM CHLORIDE 0.9 % IJ SOLN
3.0000 mL | INTRAMUSCULAR | Status: DC | PRN
Start: 2014-02-06 — End: 2014-02-08

## 2014-02-06 MED ORDER — LACTATED RINGERS IV SOLN
INTRAVENOUS | Status: DC
  Administered 2014-02-06: 10:00:00 via INTRAVENOUS

## 2014-02-06 MED ORDER — MAGNESIUM HYDROXIDE 400 MG/5ML PO SUSP
30.0000 mL | Freq: Every day | ORAL | Status: DC | PRN
Start: 2014-02-06 — End: 2014-02-08

## 2014-02-06 MED ORDER — PRAMIPEXOLE DIHYDROCHLORIDE 1 MG PO TABS
2.0000 mg | ORAL_TABLET | Freq: Two times a day (BID) | ORAL | Status: DC
Start: 2014-02-06 — End: 2014-02-08
  Administered 2014-02-06 – 2014-02-08 (×4): 2 mg via ORAL
  Filled 2014-02-06 (×6): qty 2

## 2014-02-06 MED ORDER — LIDOCAINE HCL (CARDIAC) 20 MG/ML IV SOLN
INTRAVENOUS | Status: DC | PRN
  Administered 2014-02-06: 50 mg via INTRAVENOUS

## 2014-02-06 MED ORDER — HYDROXYZINE HCL 25 MG PO TABS: 50.0000 mg | ORAL_TABLET | ORAL | Status: DC | PRN

## 2014-02-06 MED ORDER — ZOLPIDEM TARTRATE 5 MG PO TABS: 5.0000 mg | ORAL_TABLET | Freq: Every evening | ORAL | Status: DC | PRN

## 2014-02-06 MED ORDER — NEOSTIGMINE METHYLSULFATE 10 MG/10ML IV SOLN
INTRAVENOUS | Status: AC
  Filled 2014-02-06: qty 1

## 2014-02-06 MED ORDER — NEOSTIGMINE METHYLSULFATE 10 MG/10ML IV SOLN
INTRAVENOUS | Status: DC | PRN
  Administered 2014-02-06: 3 mg via INTRAVENOUS

## 2014-02-06 MED ORDER — ALUM & MAG HYDROXIDE-SIMETH 200-200-20 MG/5ML PO SUSP: 30.0000 mL | Freq: Four times a day (QID) | ORAL | Status: DC | PRN

## 2014-02-06 MED ORDER — KETOROLAC TROMETHAMINE 30 MG/ML IJ SOLN
30.0000 mg | Freq: Once | INTRAMUSCULAR | Status: AC
  Administered 2014-02-06: 30 mg via INTRAVENOUS

## 2014-02-06 MED ORDER — MORPHINE SULFATE 4 MG/ML IJ SOLN: 4.0000 mg | INTRAMUSCULAR | Status: DC | PRN

## 2014-02-06 MED ORDER — ACETAMINOPHEN 325 MG PO TABS: 650.0000 mg | ORAL_TABLET | ORAL | Status: DC | PRN

## 2014-02-06 MED ORDER — BUPIVACAINE HCL (PF) 0.5 % IJ SOLN
INTRAMUSCULAR | Status: DC | PRN
  Administered 2014-02-06: 10 mL

## 2014-02-06 MED ORDER — KCL IN DEXTROSE-NACL 20-5-0.45 MEQ/L-%-% IV SOLN
INTRAVENOUS | Status: DC
  Filled 2014-02-06 (×6): qty 1000

## 2014-02-06 MED ORDER — LACTATED RINGERS IV SOLN
INTRAVENOUS | Status: DC | PRN
  Administered 2014-02-06 (×4): via INTRAVENOUS

## 2014-02-06 MED ORDER — FENTANYL CITRATE 0.05 MG/ML IJ SOLN
INTRAMUSCULAR | Status: AC
  Filled 2014-02-06: qty 5

## 2014-02-06 MED ORDER — PHENOL 1.4 % MT LIQD: 1.0000 | OROMUCOSAL | Status: DC | PRN

## 2014-02-06 MED ORDER — PHENYLEPHRINE HCL 10 MG/ML IJ SOLN
10.0000 mg | INTRAMUSCULAR | Status: DC | PRN
  Administered 2014-02-06: 5 ug/min via INTRAVENOUS

## 2014-02-06 MED ORDER — EPHEDRINE SULFATE 50 MG/ML IJ SOLN
INTRAMUSCULAR | Status: DC | PRN
  Administered 2014-02-06: 10 mg via INTRAVENOUS
  Administered 2014-02-06: 5 mg via INTRAVENOUS

## 2014-02-06 MED ORDER — ALBUMIN HUMAN 5 % IV SOLN
INTRAVENOUS | Status: DC | PRN
  Administered 2014-02-06 (×2): via INTRAVENOUS

## 2014-02-06 MED ORDER — GLYCOPYRROLATE 0.2 MG/ML IJ SOLN
INTRAMUSCULAR | Status: AC
  Filled 2014-02-06: qty 3

## 2014-02-06 MED ORDER — KETOROLAC TROMETHAMINE 30 MG/ML IJ SOLN
INTRAMUSCULAR | Status: AC
  Filled 2014-02-06: qty 1

## 2014-02-06 MED ORDER — ACETAMINOPHEN 650 MG RE SUPP: 650.0000 mg | RECTAL | Status: DC | PRN

## 2014-02-06 MED ORDER — ACETAMINOPHEN 10 MG/ML IV SOLN
INTRAVENOUS | Status: AC
  Administered 2014-02-06: 1000 mg via INTRAVENOUS
  Filled 2014-02-06: qty 100

## 2014-02-06 MED ORDER — THROMBIN 20000 UNITS EX SOLR
CUTANEOUS | Status: DC | PRN
  Administered 2014-02-06 (×2): via TOPICAL

## 2014-02-06 MED ORDER — THROMBIN 5000 UNITS EX SOLR
OROMUCOSAL | Status: DC | PRN
  Administered 2014-02-06: 13:00:00 via TOPICAL

## 2014-02-06 MED ORDER — BISACODYL 10 MG RE SUPP: 10.0000 mg | Freq: Every day | RECTAL | Status: DC | PRN

## 2014-02-06 MED ORDER — STERILE WATER FOR INJECTION IJ SOLN
INTRAMUSCULAR | Status: AC
  Filled 2014-02-06: qty 10

## 2014-02-06 MED ORDER — SUCCINYLCHOLINE CHLORIDE 20 MG/ML IJ SOLN
INTRAMUSCULAR | Status: AC
  Filled 2014-02-06: qty 1

## 2014-02-06 MED ORDER — FENTANYL CITRATE 0.05 MG/ML IJ SOLN
INTRAMUSCULAR | Status: DC | PRN
  Administered 2014-02-06 (×4): 50 ug via INTRAVENOUS
  Administered 2014-02-06: 100 ug via INTRAVENOUS
  Administered 2014-02-06 (×2): 50 ug via INTRAVENOUS

## 2014-02-06 MED ORDER — ROCURONIUM BROMIDE 50 MG/5ML IV SOLN
INTRAVENOUS | Status: AC
  Filled 2014-02-06: qty 1

## 2014-02-06 MED ORDER — 0.9 % SODIUM CHLORIDE (POUR BTL) OPTIME
TOPICAL | Status: DC | PRN
  Administered 2014-02-06 (×3): 1000 mL

## 2014-02-06 MED ORDER — LIDOCAINE-EPINEPHRINE 1 %-1:100000 IJ SOLN
INTRAMUSCULAR | Status: DC | PRN
Start: 2014-02-06 — End: 2014-02-06
  Administered 2014-02-06: 10 mL

## 2014-02-06 MED ORDER — LOSARTAN POTASSIUM 50 MG PO TABS
50.0000 mg | ORAL_TABLET | Freq: Every day | ORAL | Status: DC
  Administered 2014-02-06 – 2014-02-07 (×2): 50 mg via ORAL
  Filled 2014-02-06 (×4): qty 1

## 2014-02-06 MED ORDER — HYDROCODONE-ACETAMINOPHEN 5-325 MG PO TABS
1.0000 | ORAL_TABLET | ORAL | Status: DC | PRN
  Administered 2014-02-06 – 2014-02-07 (×2): 2 via ORAL
  Administered 2014-02-07: 1 via ORAL
  Administered 2014-02-08: 2 via ORAL
  Filled 2014-02-06 (×2): qty 2
  Filled 2014-02-06: qty 1
  Filled 2014-02-06: qty 2

## 2014-02-06 MED ORDER — GLYCOPYRROLATE 0.2 MG/ML IJ SOLN
INTRAMUSCULAR | Status: DC | PRN
  Administered 2014-02-06: 0.4 mg via INTRAVENOUS

## 2014-02-06 MED ORDER — SODIUM CHLORIDE 0.9 % IV SOLN: 250.0000 mL | INTRAVENOUS | Status: DC

## 2014-02-06 MED ORDER — PHENYLEPHRINE HCL 10 MG/ML IJ SOLN
INTRAMUSCULAR | Status: DC | PRN
  Administered 2014-02-06: 40 ug via INTRAVENOUS
  Administered 2014-02-06: 80 ug via INTRAVENOUS

## 2014-02-06 MED ORDER — LIDOCAINE HCL (CARDIAC) 20 MG/ML IV SOLN
INTRAVENOUS | Status: AC
  Filled 2014-02-06: qty 5

## 2014-02-06 MED ORDER — CYCLOBENZAPRINE HCL 10 MG PO TABS
10.0000 mg | ORAL_TABLET | Freq: Three times a day (TID) | ORAL | Status: DC | PRN
  Administered 2014-02-07 (×2): 10 mg via ORAL
  Filled 2014-02-06 (×2): qty 1

## 2014-02-06 MED ORDER — SODIUM CHLORIDE 0.9 % IR SOLN
Status: DC | PRN
  Administered 2014-02-06: 13:00:00

## 2014-02-06 MED ORDER — ONDANSETRON HCL 4 MG/2ML IJ SOLN
INTRAMUSCULAR | Status: DC | PRN
  Administered 2014-02-06: 4 mg via INTRAVENOUS

## 2014-02-06 MED ORDER — ROCURONIUM BROMIDE 100 MG/10ML IV SOLN
INTRAVENOUS | Status: DC | PRN
  Administered 2014-02-06: 40 mg via INTRAVENOUS
  Administered 2014-02-06: 10 mg via INTRAVENOUS

## 2014-02-06 MED ORDER — PROPOFOL 10 MG/ML IV BOLUS
INTRAVENOUS | Status: DC | PRN
  Administered 2014-02-06: 100 mg via INTRAVENOUS

## 2014-02-06 MED ORDER — MENTHOL 3 MG MT LOZG: 1.0000 | LOZENGE | OROMUCOSAL | Status: DC | PRN

## 2014-02-06 MED ORDER — ONDANSETRON HCL 4 MG/2ML IJ SOLN
4.0000 mg | Freq: Once | INTRAMUSCULAR | Status: AC | PRN
  Administered 2014-02-06: 4 mg via INTRAVENOUS

## 2014-02-06 MED ORDER — EPHEDRINE SULFATE 50 MG/ML IJ SOLN
INTRAMUSCULAR | Status: AC
  Filled 2014-02-06: qty 1

## 2014-02-06 MED ORDER — CEFAZOLIN SODIUM-DEXTROSE 2-3 GM-% IV SOLR
INTRAVENOUS | Status: DC | PRN
  Administered 2014-02-06: 2 g via INTRAVENOUS

## 2014-02-06 MED ORDER — PRAMIPEXOLE DIHYDROCHLORIDE 1 MG PO TABS
1.0000 mg | ORAL_TABLET | Freq: Three times a day (TID) | ORAL | Status: DC
  Filled 2014-02-06 (×3): qty 2

## 2014-02-06 MED ORDER — SODIUM CHLORIDE 0.9 % IJ SOLN
3.0000 mL | Freq: Two times a day (BID) | INTRAMUSCULAR | Status: DC
  Administered 2014-02-06 – 2014-02-07 (×3): 3 mL via INTRAVENOUS

## 2014-02-06 MED ORDER — ONDANSETRON HCL 4 MG/2ML IJ SOLN
INTRAMUSCULAR | Status: AC
  Filled 2014-02-06: qty 2

## 2014-02-06 MED ORDER — KETOROLAC TROMETHAMINE 30 MG/ML IJ SOLN
30.0000 mg | Freq: Four times a day (QID) | INTRAMUSCULAR | Status: DC
Start: 2014-02-07 — End: 2014-02-08
  Administered 2014-02-06 – 2014-02-08 (×6): 30 mg via INTRAVENOUS
  Filled 2014-02-06 (×8): qty 1

## 2014-02-06 MED ORDER — CLOBETASOL PROPIONATE 0.05 % EX OINT
1.0000 "application " | TOPICAL_OINTMENT | Freq: Two times a day (BID) | CUTANEOUS | Status: DC
  Administered 2014-02-06 – 2014-02-07 (×3): 1 via TOPICAL
  Filled 2014-02-06 (×2): qty 15

## 2014-02-06 MED ORDER — ONDANSETRON HCL 4 MG/2ML IJ SOLN: 4.0000 mg | Freq: Four times a day (QID) | INTRAMUSCULAR | Status: DC | PRN

## 2014-02-06 MED ORDER — HYDROMORPHONE HCL PF 1 MG/ML IJ SOLN: 0.2500 mg | INTRAMUSCULAR | Status: DC | PRN

## 2014-02-06 MED ORDER — PRAMIPEXOLE DIHYDROCHLORIDE 1 MG PO TABS
1.0000 mg | ORAL_TABLET | ORAL | Status: DC
  Administered 2014-02-07: 1 mg via ORAL
  Filled 2014-02-06 (×4): qty 1

## 2014-02-06 MED ORDER — PHENYLEPHRINE HCL 10 MG/ML IJ SOLN
INTRAMUSCULAR | Status: AC
  Filled 2014-02-06: qty 1

## 2014-02-06 SURGICAL SUPPLY — 85 items
ADH SKN CLS APL DERMABOND .7 (GAUZE/BANDAGES/DRESSINGS) ×2
BAG DECANTER FOR FLEXI CONT (MISCELLANEOUS) ×2 IMPLANT
BLADE SURG 10 STRL SS (BLADE) ×1 IMPLANT
BLADE SURG ROTATE 9660 (MISCELLANEOUS) IMPLANT
BRUSH SCRUB EZ PLAIN DRY (MISCELLANEOUS) ×2 IMPLANT
BUR ACRON 5.0MM COATED (BURR) ×3 IMPLANT
BUR MATCHSTICK NEURO 3.0 LAGG (BURR) ×2 IMPLANT
CANISTER SUCT 3000ML (MISCELLANEOUS) ×2 IMPLANT
CAP LCK SPNE (Orthopedic Implant) ×4 IMPLANT
CAP LOCK SPINE RADIUS (Orthopedic Implant) IMPLANT
CAP LOCKING (Orthopedic Implant) ×8 IMPLANT
CONT SPEC 4OZ CLIKSEAL STRL BL (MISCELLANEOUS) ×3 IMPLANT
COVER BACK TABLE 24X17X13 BIG (DRAPES) IMPLANT
COVER TABLE BACK 60X90 (DRAPES) ×2 IMPLANT
DERMABOND ADVANCED (GAUZE/BANDAGES/DRESSINGS) ×2
DERMABOND ADVANCED .7 DNX12 (GAUZE/BANDAGES/DRESSINGS) ×2 IMPLANT
DRAPE C-ARM 42X72 X-RAY (DRAPES) ×5 IMPLANT
DRAPE LAPAROTOMY 100X72X124 (DRAPES) ×2 IMPLANT
DRAPE MICROSCOPE LEICA (MISCELLANEOUS) ×1 IMPLANT
DRAPE POUCH INSTRU U-SHP 10X18 (DRAPES) ×2 IMPLANT
DRAPE PROXIMA HALF (DRAPES) IMPLANT
DRSG EMULSION OIL 3X3 NADH (GAUZE/BANDAGES/DRESSINGS) IMPLANT
ELECT REM PT RETURN 9FT ADLT (ELECTROSURGICAL) ×4
ELECTRODE REM PT RTRN 9FT ADLT (ELECTROSURGICAL) ×1 IMPLANT
GAUZE SPONGE 4X4 12PLY STRL (GAUZE/BANDAGES/DRESSINGS) ×1 IMPLANT
GAUZE SPONGE 4X4 16PLY XRAY LF (GAUZE/BANDAGES/DRESSINGS) ×1 IMPLANT
GLOVE BIO SURGEON STRL SZ 6.5 (GLOVE) ×1 IMPLANT
GLOVE BIOGEL PI IND STRL 8 (GLOVE) ×2 IMPLANT
GLOVE BIOGEL PI IND STRL 8.5 (GLOVE) IMPLANT
GLOVE BIOGEL PI INDICATOR 8 (GLOVE) ×4
GLOVE BIOGEL PI INDICATOR 8.5 (GLOVE) ×1
GLOVE ECLIPSE 7.5 STRL STRAW (GLOVE) ×7 IMPLANT
GLOVE ECLIPSE 8.5 STRL (GLOVE) ×1 IMPLANT
GLOVE EXAM NITRILE LRG STRL (GLOVE) IMPLANT
GLOVE EXAM NITRILE MD LF STRL (GLOVE) IMPLANT
GLOVE EXAM NITRILE XL STR (GLOVE) IMPLANT
GLOVE EXAM NITRILE XS STR PU (GLOVE) IMPLANT
GOWN STRL REUS W/ TWL LRG LVL3 (GOWN DISPOSABLE) ×1 IMPLANT
GOWN STRL REUS W/ TWL XL LVL3 (GOWN DISPOSABLE) ×1 IMPLANT
GOWN STRL REUS W/TWL 2XL LVL3 (GOWN DISPOSABLE) ×1 IMPLANT
GOWN STRL REUS W/TWL LRG LVL3 (GOWN DISPOSABLE)
GOWN STRL REUS W/TWL XL LVL3 (GOWN DISPOSABLE) ×2
GRAFT DURAGEN MATRIX 1WX1L (Tissue) ×1 IMPLANT
HEMOSTAT POWDER KIT SURGIFOAM (HEMOSTASIS) ×1 IMPLANT
KIT BASIN OR (CUSTOM PROCEDURE TRAY) ×2 IMPLANT
KIT INFUSE SMALL (Orthopedic Implant) ×1 IMPLANT
KIT ROOM TURNOVER OR (KITS) ×2 IMPLANT
MILL MEDIUM DISP (BLADE) ×1 IMPLANT
NDL HYPO 21X1.5 SAFETY (NEEDLE) IMPLANT
NDL SPNL 18GX3.5 QUINCKE PK (NEEDLE) IMPLANT
NDL SPNL 22GX3.5 QUINCKE BK (NEEDLE) ×2 IMPLANT
NEEDLE BONE MARROW 8GAX6 (NEEDLE) IMPLANT
NEEDLE HYPO 21X1.5 SAFETY (NEEDLE) ×2 IMPLANT
NEEDLE SPNL 18GX3.5 QUINCKE PK (NEEDLE) IMPLANT
NEEDLE SPNL 22GX3.5 QUINCKE BK (NEEDLE) ×4 IMPLANT
NS IRRIG 1000ML POUR BTL (IV SOLUTION) ×4 IMPLANT
PACK LAMINECTOMY NEURO (CUSTOM PROCEDURE TRAY) ×2 IMPLANT
PAD ARMBOARD 7.5X6 YLW CONV (MISCELLANEOUS) ×6 IMPLANT
PATTIES SURGICAL .5 X.5 (GAUZE/BANDAGES/DRESSINGS) IMPLANT
PATTIES SURGICAL .5 X1 (DISPOSABLE) IMPLANT
PATTIES SURGICAL 1X1 (DISPOSABLE) ×2 IMPLANT
PEEK PLIF AVS 8X25X4 DEGREE (Peek) ×1 IMPLANT
PEEK PLIF AVS 9X25X4 (Peek) ×1 IMPLANT
ROD 5.5X30MM (Rod) ×1 IMPLANT
ROD RADIUS 35MM (Rod) ×1 IMPLANT
RUBBERBAND STERILE (MISCELLANEOUS) ×2 IMPLANT
SCREW 5.75X40M (Screw) ×2 IMPLANT
SCREW 5.75X45MM (Screw) ×2 IMPLANT
SPONGE LAP 4X18 X RAY DECT (DISPOSABLE) IMPLANT
SPONGE NEURO XRAY DETECT 1X3 (DISPOSABLE) IMPLANT
SPONGE SURGIFOAM ABS GEL 100 (HEMOSTASIS) ×2 IMPLANT
STRIP BIOACTIVE VITOSS 25X100X (Neuro Prosthesis/Implant) ×1 IMPLANT
STRIP BIOACTIVE VITOSS 25X52X4 (Orthopedic Implant) ×1 IMPLANT
SUT PROLENE 6 0 BV (SUTURE) ×2 IMPLANT
SUT VIC AB 1 CT1 18XBRD ANBCTR (SUTURE) ×2 IMPLANT
SUT VIC AB 1 CT1 8-18 (SUTURE) ×4
SUT VIC AB 2-0 CP2 18 (SUTURE) ×4 IMPLANT
SYR 20ML ECCENTRIC (SYRINGE) ×2 IMPLANT
SYR 3ML LL SCALE MARK (SYRINGE) ×8 IMPLANT
SYR CONTROL 10ML LL (SYRINGE) ×2 IMPLANT
TAPE CLOTH SURG 4X10 WHT LF (GAUZE/BANDAGES/DRESSINGS) ×1 IMPLANT
TOWEL OR 17X24 6PK STRL BLUE (TOWEL DISPOSABLE) ×2 IMPLANT
TOWEL OR 17X26 10 PK STRL BLUE (TOWEL DISPOSABLE) ×2 IMPLANT
TRAY FOLEY CATH 16FRSI W/METER (SET/KITS/TRAYS/PACK) ×1 IMPLANT
WATER STERILE IRR 1000ML POUR (IV SOLUTION) ×2 IMPLANT

## 2014-02-06 NOTE — Transfer of Care (Signed)
Immediate Anesthesia Transfer of Care Note  Patient: Derrick Sanchez  Procedure(s) Performed: Procedure(s): Lumbar four-five decompression/posterior lumbar interbody fusion/posterolateral arthrodesis (N/A)  Patient Location: PACU  Anesthesia Type:General  Level of Consciousness: awake  Airway & Oxygen Therapy: Patient Spontanous Breathing and Patient connected to face mask oxygen  Post-op Assessment: Report given to PACU RN and Post -op Vital signs reviewed and stable  Post vital signs: Reviewed and stable  Complications: No apparent anesthesia complications

## 2014-02-06 NOTE — Progress Notes (Signed)
Filed Vitals:   02/06/14 1845 02/06/14 1900 02/06/14 1915 02/06/14 1930  BP: 120/82 130/67 121/56 165/72  Pulse: 59 48 56 60  Temp:    97.5 F (36.4 C)  TempSrc:    Oral  Resp: 16 18 18 16   Height:      Weight:      SpO2: 97% 95% 98% 97%    CBC  Recent Labs  02/06/14 1651  HGB 8.8*  HCT 26.0*   BMET  Recent Labs  02/06/14 1651  NA 140  K 4.2  GLUCOSE 105*    Patient resting in bed, comfortably. Ambulated from PACU stretcher to bed. Foley to straight drainage. Dressing clean and dry.  Plan: We'll recheck CBC in a.m. , we'll continue to progress through postoperative recovery.  Hosie Spangle, MD 02/06/2014, 8:25 PM

## 2014-02-06 NOTE — Anesthesia Postprocedure Evaluation (Signed)
  Anesthesia Post-op Note  Patient: Derrick Sanchez  Procedure(s) Performed: Procedure(s): Lumbar four-five decompression/posterior lumbar interbody fusion/posterolateral arthrodesis (N/A)  Patient Location: PACU  Anesthesia Type:General  Level of Consciousness: awake, alert , oriented and patient cooperative  Airway and Oxygen Therapy: Patient Spontanous Breathing and Patient connected to nasal cannula oxygen  Post-op Pain: mild  Post-op Assessment: Post-op Vital signs reviewed, Patient's Cardiovascular Status Stable, Respiratory Function Stable, Patent Airway, No signs of Nausea or vomiting and Pain level controlled  Post-op Vital Signs: Reviewed and stable  Last Vitals:  Filed Vitals:   02/06/14 1915  BP: 121/56  Pulse: 56  Temp:   Resp: 18    Complications: No apparent anesthesia complications

## 2014-02-06 NOTE — H&P (Signed)
Subjective: Patient is a 80 y.o. male who is admitted for treatment of severe multifactorial lumbar stenosis at the L4-5 level, contributed to by a grade 2 spondylolisthesis of L4 on L5. Patient's pain is primarily in the low back reaming down to the right buttock thigh and into the anterior right knee. Since then and steadily worsening over the past 8 months. We've tried an epidural steroid injection without relief. He is admitted now for an L4-5 lumbar decompression, including laminectomy, facetectomy, and foraminotomy, a bilateral L4-5 posterior lumbar interbody arthrodesis with interbody implants and bone graft (if feasible), and a bilateral L4-5 posterolateral arthrodesis with postreduction patient and bone graft.    Patient Active Problem List   Diagnosis Date Noted  . Stiffness of joints, not elsewhere classified, multiple sites 12/22/2012  . Postural imbalance 12/22/2012  . Hx of falling 12/22/2012  . HYPERPARATHYROIDISM UNSPECIFIED 05/03/2010  . OBSTRUCTIVE SLEEP APNEA 05/03/2010  . GLAUCOMA 05/03/2010  . ASTHMA 05/03/2010  . ANEMIA, VITAMIN B12 DEFICIENCY 06/14/2007  . RESTLESS LEG SYNDROME 06/14/2007  . GASTROESOPHAGEAL REFLUX DISEASE 06/14/2007  . DIVERTICULOSIS, COLON 05/31/2007  . POLYP, COLON 05/27/2002   Past Medical History  Diagnosis Date  . Obstructive sleep apnea (adult) (pediatric)   . Unspecified asthma(493.90)   . Hyperparathyroidism, unspecified   . Other vitamin B12 deficiency anemia   . Restless legs syndrome (RLS)   . Esophageal reflux 2008    EGD  . Diverticulosis of colon (without mention of hemorrhage) 2008,2006,2003    Colonoscopy  . Glaucoma   . Internal hemorrhoids without mention of complication 3299    Colonoscopy  . Candidiasis of the esophagus 07-17-2000    EGD  . Stricture and stenosis of esophagus 05-27-2002    EGD  . Colon polyps 05-27-2002    Tubulovillous Adenoma-Colonoscopy  . Hiatal hernia 2002,2003,2013    EGD  . Allergy   .  Arthritis   . Blood transfusion   . Cataract     bil eyes  . Hypertension   . Pneumonia     years ago    Past Surgical History  Procedure Laterality Date  . Parathyroidectomy    . Total knee arthroplasty Left   . Hip surgery Right   . Colonoscopy    . Cataract extraction Bilateral   . Mass excision  10/14/2011    Procedure: EXCISION MASS;  Surgeon: Wynonia Sours, MD;  Location: [place];  Service: Orthopedics;  Laterality: Left;   left hand  . Arm surgery    . Eye surgery Bilateral     cataracts iol    Prescriptions prior to admission  Medication Sig Dispense Refill  . b complex vitamins tablet Take 1 tablet by mouth every morning.       Marland Kitchen losartan (COZAAR) 50 MG tablet Take 50 mg by mouth at bedtime.       . pramipexole (MIRAPEX) 1 MG tablet Take 1-2 mg by mouth 3 (three) times daily. Take 2 mg by mouth in the morning, take 1 mg by mouth at lunch time, and take 2 mg by mouth in the evening.      . clobetasol ointment (TEMOVATE) 2.42 % Apply 1 application topically 2 (two) times daily.       No Known Allergies  History  Substance Use Topics  . Smoking status: Never Smoker   . Smokeless tobacco: Never Used  . Alcohol Use: No    Family History  Problem Relation Age of Onset  . Brain cancer  3 brothers  . Heart disease Father   . Heart disease Sister   . Colon cancer Neg Hx   . Esophageal cancer Neg Hx   . Rectal cancer Neg Hx   . Stomach cancer Neg Hx   . Leukemia Brother   . Diabetes Neg Hx   . Kidney disease Neg Hx   . Liver disease Neg Hx      Review of Systems A comprehensive review of systems was negative.  Objective: Vital signs in last 24 hours:    EXAM:  patient well-developed well-nourished white male in no acute distress.  Lungs are clear to auscultation , the patient has symmetrical respiratory excursion. Heart has a regular rate and rhythm normal S1 and S2 no murmur.   Abdomen is soft nontender nondistended bowel sounds are  present. Extremity examination shows no clubbing cyanosis or edema. Motor examination shows 5 over 5 strength in the lower extremities including the iliopsoas quadriceps dorsiflexor extensor hallicus  longus and plantar flexor bilaterally. Sensation is intact to pinprick in the distal lower extremities. Reflexes are symmetrical bilaterally. No pathologic reflexes are present. Patient has a normal gait and stance.   Data Review:CBC    Component Value Date/Time   WBC 6.3 02/01/2014 1012   RBC 3.95* 02/01/2014 1012   HGB 12.3* 02/01/2014 1012   HCT 37.3* 02/01/2014 1012   PLT 160 02/01/2014 1012   MCV 94.4 02/01/2014 1012   MCH 31.1 02/01/2014 1012   MCHC 33.0 02/01/2014 1012   RDW 13.3 02/01/2014 1012   LYMPHSABS 1.2 11/22/2013 1130   MONOABS 0.5 11/22/2013 1130   EOSABS 0.1 11/22/2013 1130   BASOSABS 0.0 11/22/2013 1130                          BMET    Component Value Date/Time   NA 140 02/01/2014 1012   K 4.1 02/01/2014 1012   CL 104 02/01/2014 1012   CO2 23 02/01/2014 1012   GLUCOSE 102* 02/01/2014 1012   BUN 27* 02/01/2014 1012   CREATININE 1.19 02/01/2014 1012   CALCIUM 9.0 02/01/2014 1012   GFRNONAA 55* 02/01/2014 1012   GFRAA 64* 02/01/2014 1012     Assessment/Plan: Patient with severe multifactorial lumbar stenosis at the L4-5 level contributed to by a grade 2 degenerative spondylolisthesis of L4 and 5 who is admitted for a L4-5 lumbar decompression and arthrodesis. I've discussed with the patient the nature of his condition, the nature the surgical procedure, the typical length of surgery, hospital stay, and overall recuperation, the limitations postoperatively, and risks of surgery. I discussed risks including risks of infection, bleeding, possibly need for transfusion, the risk of nerve root dysfunction with pain, weakness, numbness, or paresthesias, the risk of dural tear and CSF leakage and possible need for further surgery, the risk of failure of the arthrodesis and possibly for further  surgery, the risk of anesthetic complications including myocardial infarction, stroke, pneumonia, and death. We discussed the need for postoperative immobilization in a lumbar brace. Understanding all this the patient does wish to proceed with surgery and is admitted for such.     Hosie Spangle, MD 02/06/2014 8:40 AM

## 2014-02-06 NOTE — Op Note (Signed)
02/06/2014  7:44 PM  PATIENT:  Derrick Sanchez  80 y.o. male  PRE-OPERATIVE DIAGNOSIS:  Severe L4-5 Lumbar stenosis, L4-5 grade 2 degenerative Spondylolisthesis, lumbar Spondylosis, Lumbar degenerative disc disease, lumbar radiculopathy, lumbago  POST-OPERATIVE DIAGNOSIS:  Severe L4-5 Lumbar stenosis, L4-5 grade 2 degenerative Spondylolisthesis, lumbar Spondylosis, Lumbar degenerative disc disease, lumbar radiculopathy, lumbago  PROCEDURE:  Procedure(s):  Bilateral L4-5 lumbar decompression, including L4 and L5 laminectomy, facetectomy, and foraminotomy, with decompression of the severe canal and bilateral neural foraminal stenosis, with decompression of the exiting L4 and L5 nerve roots bilaterally, with decompression beyond that required for interbody arthrodesis, with the operating microscope, and microdissection microsurgical technique; bilateral L4-5 posterior lumbar interbody arthrodesis with AVS peek interbody implants, Vitoss BA with bone marrow aspirate, and infuse; bilateral L4-5 posterolateral arthrodesis with nonsegmental radius posterior instrumentation, Vitoss BA with bone marrow aspirate, and infuse  SURGEON:  Surgeon(s): Hosie Spangle, MD Kristeen Miss, MD  ASSISTANTS: Kristeen Miss, M.D.  ANESTHESIA:   general  EBL:   1100 cc  BLOOD ADMINISTERED:400+ CC CELLSAVER  COUNT: Correct per nursing staff  DICTATION: Patient is brought to the operating room placed under general endotracheal anesthesia. The patient was turned to prone position the lumbar region was prepped with Betadine soap and solution and draped in a sterile fashion. The midline was infiltrated with local anesthesia with epinephrine. A localizing x-ray was taken and then a midline incision was made carried down through the subcutaneous tissue, bipolar cautery and electrocautery were used to maintain hemostasis. Dissection was carried down to the lumbar fascia. The fascia was incised bilaterally and the paraspinal  muscles were dissected with a spinous process and lamina in a subperiosteal fashion. Another x-ray was taken for localization and the L4-5 level was localized. Dissection was then carried out laterally over the facet complex and the transverse processes of L4 and L5 were exposed and decorticated. Decompression was begun via a bilateral L4 and L5 laminectomy using double-action rongeurs, the high-speed drill, and Kerrison punches. Dissection was carried out laterally including facetectomy and foraminotomies with decompression of the stenotic compression of the L4 and L5 nerve roots. As the laminectomy was performed, some of the spondylitic overgrowth was adherent to the dura. As this dissection was performed to small, approximately 1 mm diameter rents in the dura occurred. These were adjacent to one another, and were sutured closed with a single 6-0 Prolene suture placed in a figure-of-eight fashion. A good watertight closure was achieved. A pledget of DuraGen was placed over the dural repair at the conclusion of the surgery. Once the decompression stenotic compression of the thecal sac and exiting nerve roots was completed we proceeded with the posterior lumbar interbody arthrodesis.  We began to explore the ventral epidural space. The significant listhesis of L4 anteriorly relative to L5 was noted. There is a large spondylitic disc herniation, broad-based, worse than the right than the left. This was removed in a piecemeal fashion, further decompress the thecal sac and exiting nerve roots. The posterior superior ridge of L5 overlay the disc space, and was removed using Kerrison punch. The disc space was entered. A thorough discectomy was performed using pituitary rongeurs and curettes. Once the discectomy was completed we began to prepare the endplate surfaces removing the cartilaginous endplates surface. We then measured the height of the intervertebral disc space. We selected an 8 x 25 x 4 AVS peek interbody  implant for the left side, and 9 x 25 x 4 AVS peek interbody implant for the  right side..  The C-arm fluoroscope was then draped and brought in the field and we identified the pedicle entry points bilaterally at the L4 and L5 levels. Each of the 4 pedicles was probed, we aspirated bone marrow aspirate from the vertebral bodies, this was injected over a 10 cc strip and a 5 cc strip of Vitoss BA. Then each of the pedicles was examined with the ball probe good bony surfaces were found and no bony cuts were found. Each of the pedicles was then tapped with a 5.25 mm tap, again examined with the ball probe good threading was found and no bony cuts were found. We then placed 5.75 by 45 millimeter screws bilaterally at the L4 level and 5.75 x 40 mm screws bilaterally at the L5 level.  We then packed the AVS peek interbody implants with Vitoss BA with bone marrow aspirate and infuse, and then placed the first implant and on the right side, carefully retracting the thecal sac and nerve root medially. We then went back to the left side and packed the midline with additional Vitoss BA with bone marrow aspirate and infuse, and then placed a second implant and on the left side again retracting the thecal sac and nerve root medially. Additional Vitoss BA with bone marrow aspirate and infuse was packed lateral to the implants.  We then packed the lateral gutter over the transverse processes and intertransverse space with Vitoss BA with bone marrow aspirate and infuse. We then selected pre-lordosed rods, using a 35 mm rod the left and a 30 mm rod on the right. They were placed within the screw heads and secured with locking caps once all 4 locking caps were placed final tightening was performed against a counter torque.  The wound had been irrigated multiple times during the procedure with saline solution and bacitracin solution, good hemostasis was established with a combination of bipolar cautery and Gelfoam with  thrombin. Once good hemostasis was confirmed we proceeded with closure paraspinal muscles deep fascia and Scarpa's fascia were closed with interrupted undyed 1 Vicryl sutures the subcutaneous and subcuticular closed with interrupted inverted 2-0 undyed Vicryl sutures the skin edges were approximated with Dermabond. A dressing of sterile gauze and Hypafix was applied  Following surgery the patient was turned back to the supine position to be reversed and the anesthetic extubated and transferred to the recovery room for further care.  PLAN OF CARE: Admit to inpatient   PATIENT DISPOSITION:  PACU - hemodynamically stable.   Delay start of Pharmacological VTE agent (>24hrs) due to surgical blood loss or risk of bleeding:  yes

## 2014-02-06 NOTE — Anesthesia Preprocedure Evaluation (Signed)
Anesthesia Evaluation  Patient identified by MRN, date of birth, ID band Patient awake    Reviewed: Allergy & Precautions, H&P , NPO status , Patient's Chart, lab work & pertinent test results  Airway       Dental   Pulmonary asthma , sleep apnea ,          Cardiovascular hypertension,     Neuro/Psych  Neuromuscular disease    GI/Hepatic hiatal hernia, GERD-  ,  Endo/Other    Renal/GU      Musculoskeletal   Abdominal   Peds  Hematology  (+) anemia ,   Anesthesia Other Findings   Reproductive/Obstetrics                           Anesthesia Physical Anesthesia Plan  ASA: II  Anesthesia Plan: General   Post-op Pain Management:    Induction: Intravenous  Airway Management Planned: Oral ETT  Additional Equipment:   Intra-op Plan:   Post-operative Plan: Extubation in OR  Informed Consent: I have reviewed the patients History and Physical, chart, labs and discussed the procedure including the risks, benefits and alternatives for the proposed anesthesia with the patient or authorized representative who has indicated his/her understanding and acceptance.     Plan Discussed with: CRNA, Anesthesiologist and Surgeon  Anesthesia Plan Comments:         Anesthesia Quick Evaluation

## 2014-02-06 NOTE — Progress Notes (Signed)
Report to R. Roberts RN as primary caregiver. 

## 2014-02-07 LAB — CBC WITH DIFFERENTIAL/PLATELET
Basophils Absolute: 0 10*3/uL (ref 0.0–0.1)
Basophils Relative: 0 % (ref 0–1)
Eosinophils Absolute: 0.3 10*3/uL (ref 0.0–0.7)
Eosinophils Relative: 6 % — ABNORMAL HIGH (ref 0–5)
HCT: 28.8 % — ABNORMAL LOW (ref 39.0–52.0)
Hemoglobin: 9.6 g/dL — ABNORMAL LOW (ref 13.0–17.0)
Lymphocytes Relative: 11 % — ABNORMAL LOW (ref 12–46)
Lymphs Abs: 0.6 10*3/uL — ABNORMAL LOW (ref 0.7–4.0)
MCH: 31.4 pg (ref 26.0–34.0)
MCHC: 33.3 g/dL (ref 30.0–36.0)
MCV: 94.1 fL (ref 78.0–100.0)
Monocytes Absolute: 0.4 10*3/uL (ref 0.1–1.0)
Monocytes Relative: 8 % (ref 3–12)
Neutro Abs: 3.8 10*3/uL (ref 1.7–7.7)
Neutrophils Relative %: 75 % (ref 43–77)
Platelets: 89 10*3/uL — ABNORMAL LOW (ref 150–400)
RBC: 3.06 MIL/uL — ABNORMAL LOW (ref 4.22–5.81)
RDW: 13.5 % (ref 11.5–15.5)
WBC: 5.1 10*3/uL (ref 4.0–10.5)

## 2014-02-07 MED ORDER — TAMSULOSIN HCL 0.4 MG PO CAPS
0.4000 mg | ORAL_CAPSULE | Freq: Every day | ORAL | Status: DC
  Administered 2014-02-08: 0.4 mg via ORAL
  Filled 2014-02-07 (×2): qty 1

## 2014-02-07 MED ORDER — TAMSULOSIN HCL 0.4 MG PO CAPS
0.8000 mg | ORAL_CAPSULE | Freq: Once | ORAL | Status: AC
  Administered 2014-02-07: 0.8 mg via ORAL
  Filled 2014-02-07: qty 2

## 2014-02-07 NOTE — Progress Notes (Signed)
Filed Vitals:   02/06/14 1930 02/06/14 2246 02/07/14 0012 02/07/14 0400  BP: 165/72 141/64 109/50 102/46  Pulse: 60 59 53 78  Temp: 97.5 F (36.4 C) 97.5 F (36.4 C) 97.6 F (36.4 C) 98.7 F (37.1 C)  TempSrc: Oral Oral Oral Oral  Resp: 16 18 16 18   Height:      Weight:      SpO2: 97% 97% 94% 96%    CBC  Recent Labs  02/06/14 1651 02/07/14 0533  WBC  --  5.1  HGB 8.8* 9.6*  HCT 26.0* 28.8*  PLT  --  89*   BMET  Recent Labs  02/06/14 1651  NA 140  K 4.2  GLUCOSE 105*    Patient resting in bed, comfortable. Has ambulated twice with the staff in the halls. Hemoglobin/hematocrit this morning looks good, anticipate further diuresis of intraoperative fluids. We'll DC Foley this morning, and have staff monitor voiding function. Encouraged to ambulate at least 4 times in the halls today.  Plan: Continued to progress through postoperative recovery.  Hosie Spangle, MD 02/07/2014, 7:31 AM

## 2014-02-07 NOTE — Progress Notes (Signed)
Utilization review completed.  

## 2014-02-08 MED ORDER — HYDROCODONE-ACETAMINOPHEN 5-325 MG PO TABS: 1.0000 | ORAL_TABLET | ORAL | Status: DC | PRN

## 2014-02-08 NOTE — Discharge Instructions (Signed)
Wound Care Leave incision open to air. You may shower. Do not scrub directly on incision.  Do not put any creams, lotions, or ointments on incision. Activity Walk each and every day, increasing distance each day. No lifting greater than 5 lbs.  Avoid bending, arching, and twisting. No driving for 2 weeks; may ride as a passenger locally. If provided with back brace, wear when out of bed.  It is not necessary to wear in bed. Diet Resume your normal diet.  Return to Work Will be discussed at you follow up appointment. Call Your Doctor If Any of These Occur Redness, drainage, or swelling at the wound.  Temperature greater than 101 degrees. Severe pain not relieved by pain medication. Incision starts to come apart. Follow Up Appt Call today for appointment in 3 weeks [id number]) or for problems.  If you have any hardware placed in your spine, you will need an x-ray before your appointment.     Spinal Fusion Spinal fusion is a procedure to make 2 or more of the bones in your spinal column (vertebrae) grow together (fuse). This procedure stops movement between the vertebrae and can relieve pain and prevent deformity.  Spinal fusion is used to treat the following conditions:  Fractures of the spine.  Herniated disk (the spongy material [cartilage] between the vertebrae).  Abnormal curvatures of the spine, such as scoliosis or kyphosis.  A weak or an unstable spine, caused by infections or tumor. RISKS AND COMPLICATIONS Complications associated with spinal fusion are rare, but they can occur. Possible complications include:  Bleeding.  Infection near the incision.  Nerve damage. Signs of nerve damage are back pain, pain in one or both legs, weakness, or numbness.  Spinal fluid leakage.  Blood clot in your leg, which can move to your lungs.  Difficulty controlling urination or bowel movements. BEFORE THE PROCEDURE  A medical evaluation will be done. This will include a  physical exam, blood tests, and imaging exams.  You will talk with an anesthesiologist. This is the person who will be in charge of the anesthesia during the procedure. Spinal fusion usually requires that you are asleep during the procedure (general anesthesia).  You will need to stop taking certain medicines, particularly those associated with an increased risk of bleeding. Ask your caregiver about changing or stopping your regular medicines.  If you smoke, you will need to stop at least 2 weeks before the procedure. Smoking can slow down the healing process, especially fusion of the vertebrae, and increase the risk of complications.  Do not eat or drink anything for at least 8 hours before the procedure. PROCEDURE  A cut (incision) is made over the vertebrae that will be fused. The back muscles are separated from the vertebrae. If you are having this procedure to treat a herniated disk, the disc material pressing on the nerve root is removed (decompression). The area where the disk is removed is then filled with extra bone. Bone from another part of your body (autogenous bone) or bone from a bone donor (allograft bone) may be used. The extra bone promotes fusion between the vertebrae. Sometimes, specific medicines are added to the fusion area to promote bone healing. In most cases, screws and rods or metal plates will be used to attach the vertebrae to stabilize them while they fuse.  AFTER THE PROCEDURE   You will stay in a recovery area until the anesthesia has worn off. Your blood pressure and pulse will be checked frequently.  You  given antibiotics to prevent infection. °· You may continue to receive fluids through an intravenous (IV) tube while you are still in the hospital. °· Pain after surgery is normal. You will be given pain medicine. °· You will be taught how to move correctly and how to stand and walk. While in bed, you will be instructed to turn frequently, using a "log rolling"  technique, in which the entire body is moved without twisting the back. °Document Released: 03/01/2003 Document Revised: 08/25/2011 Document Reviewed: 08/15/2010 °[employer]® Patient Information ©2015 [employer]. This information is not intended to replace advice given to you by your health care provider. Make sure you discuss any questions you have with your health care provider. ° °

## 2014-02-08 NOTE — Progress Notes (Signed)
Pt doing well. Pt and wife given D/C instructions with Rx, verbal understanding was provided. Pt's IV was removed prior to D/C. Pt's incision was closed with dermabond. It was clean and dry with no sign of infection. Pt D/C'd home via wheelchair @ 1110 per MD order. Pt is stable @ D/C and has no other needs at this time. Holli Humbles, RN

## 2014-02-08 NOTE — Discharge Summary (Signed)
Physician Discharge Summary  Patient ID: Derrick Sanchez MRN: [id number] DOB/AGE: 01/10/1931 80 y.o.  Admit date: 02/06/2014 Discharge date: 02/08/2014  Admission Diagnoses:  Severe L4-5 Lumbar stenosis, L4-5 grade 2 degenerative Spondylolisthesis, lumbar Spondylosis, Lumbar degenerative disc disease, lumbar radiculopathy, lumbago  Discharge Diagnoses:  Severe L4-5 Lumbar stenosis, L4-5 grade 2 degenerative Spondylolisthesis, lumbar Spondylosis, Lumbar degenerative disc disease, lumbar radiculopathy, lumbago  Active Problems:   Lumbar stenosis with neurogenic claudication   Discharged Condition: good  Hospital Course: Patient was admitted, underwent an L4-5 lumbar decompression and arthrodesis. Postoperatively he is done well. Wound is healing nicely, there is no swelling, erythema, or drainage. He is up and ambulating actively in the halls. His voiding function has steadily improved. He is asking to be discharged to home. He has been given instructions regarding wound care and activities. He is to return for followup with me in 3 weeks.  Discharge Exam: Blood pressure 110/67, pulse 70, temperature 97.7 F (36.5 C), temperature source Oral, resp. rate 16, height 6' (1.829 m), weight 70.393 kg (155 lb 3 oz), SpO2 93.00%.  Disposition: Home     Medication List         b complex vitamins tablet  Take 1 tablet by mouth every morning.     clobetasol ointment 0.05 %  Commonly known as:  TEMOVATE  Apply 1 application topically 2 (two) times daily.     HYDROcodone-acetaminophen 5-325 MG per tablet  Commonly known as:  NORCO/VICODIN  Take 1-2 tablets by mouth every 4 (four) hours as needed for moderate pain or severe pain (mild pain).     losartan 50 MG tablet  Commonly known as:  COZAAR  Take 50 mg by mouth at bedtime.     pramipexole 1 MG tablet  Commonly known as:  MIRAPEX  Take 1-2 mg by mouth 3 (three) times daily. Take 2 mg by mouth in the morning, take 1 mg by mouth at  lunch time, and take 2 mg by mouth in the evening.         Signed: Hosie Spangle, MD 02/08/2014, 8:29 AM

## 2014-02-10 NOTE — Anesthesia Postprocedure Evaluation (Deleted)
  Anesthesia Post-op Note  Patient: Derrick Sanchez  Procedure(s) Performed: Procedure(s): Lumbar four-five decompression/posterior lumbar interbody fusion/posterolateral arthrodesis (N/A)  Patient Location: PACU  Anesthesia Type:General  Level of Consciousness: awake  Airway and Oxygen Therapy: Patient Spontanous Breathing  Post-op Pain: mild  Post-op Assessment: Post-op Vital signs reviewed, Patient's Cardiovascular Status Stable, Respiratory Function Stable, Patent Airway, No signs of Nausea or vomiting and Pain level controlled  Post-op Vital Signs: Reviewed and stable  Last Vitals:  Filed Vitals:   02/08/14 0806  BP: 110/67  Pulse: 70  Temp: 36.5 C  Resp: 16    Complications: No apparent anesthesia complications

## 2014-02-27 ENCOUNTER — Inpatient Hospital Stay (HOSPITAL_COMMUNITY)
Admission: EM | Admit: 2014-02-27 | Discharge: 2014-03-03 | DRG: 535 | Disposition: A | Discharge status: Skilled Nursing Facility | Payer: Medicare Other | Attending: Internal Medicine | Admitting: Internal Medicine

## 2014-02-27 ENCOUNTER — Emergency Department (HOSPITAL_COMMUNITY): Payer: Medicare Other

## 2014-02-27 ENCOUNTER — Encounter (HOSPITAL_COMMUNITY): Payer: Self-pay | Admitting: Radiology

## 2014-02-27 DIAGNOSIS — M545 Low back pain, unspecified: Secondary | ICD-10-CM | POA: Diagnosis not present

## 2014-02-27 DIAGNOSIS — Z9849 Cataract extraction status, unspecified eye: Secondary | ICD-10-CM

## 2014-02-27 DIAGNOSIS — M48062 Spinal stenosis, lumbar region with neurogenic claudication: Secondary | ICD-10-CM

## 2014-02-27 DIAGNOSIS — M2560 Stiffness of unspecified joint, not elsewhere classified: Secondary | ICD-10-CM

## 2014-02-27 DIAGNOSIS — E43 Unspecified severe protein-calorie malnutrition: Secondary | ICD-10-CM

## 2014-02-27 DIAGNOSIS — S72102A Unspecified trochanteric fracture of left femur, initial encounter for closed fracture: Secondary | ICD-10-CM

## 2014-02-27 DIAGNOSIS — Y92009 Unspecified place in unspecified non-institutional (private) residence as the place of occurrence of the external cause: Secondary | ICD-10-CM | POA: Diagnosis not present

## 2014-02-27 DIAGNOSIS — Z981 Arthrodesis status: Secondary | ICD-10-CM

## 2014-02-27 DIAGNOSIS — E213 Hyperparathyroidism, unspecified: Secondary | ICD-10-CM | POA: Diagnosis present

## 2014-02-27 DIAGNOSIS — IMO0002 Reserved for concepts with insufficient information to code with codable children: Secondary | ICD-10-CM

## 2014-02-27 DIAGNOSIS — D649 Anemia, unspecified: Secondary | ICD-10-CM | POA: Diagnosis present

## 2014-02-27 DIAGNOSIS — K5730 Diverticulosis of large intestine without perforation or abscess without bleeding: Secondary | ICD-10-CM

## 2014-02-27 DIAGNOSIS — J45909 Unspecified asthma, uncomplicated: Secondary | ICD-10-CM

## 2014-02-27 DIAGNOSIS — Z79899 Other long term (current) drug therapy: Secondary | ICD-10-CM | POA: Diagnosis not present

## 2014-02-27 DIAGNOSIS — G2581 Restless legs syndrome: Secondary | ICD-10-CM

## 2014-02-27 DIAGNOSIS — G4733 Obstructive sleep apnea (adult) (pediatric): Secondary | ICD-10-CM

## 2014-02-27 DIAGNOSIS — K219 Gastro-esophageal reflux disease without esophagitis: Secondary | ICD-10-CM | POA: Diagnosis present

## 2014-02-27 DIAGNOSIS — Z9181 History of falling: Secondary | ICD-10-CM

## 2014-02-27 DIAGNOSIS — H409 Unspecified glaucoma: Secondary | ICD-10-CM

## 2014-02-27 DIAGNOSIS — Z23 Encounter for immunization: Secondary | ICD-10-CM

## 2014-02-27 DIAGNOSIS — W010XXA Fall on same level from slipping, tripping and stumbling without subsequent striking against object, initial encounter: Secondary | ICD-10-CM | POA: Diagnosis present

## 2014-02-27 DIAGNOSIS — R293 Abnormal posture: Secondary | ICD-10-CM

## 2014-02-27 DIAGNOSIS — D518 Other vitamin B12 deficiency anemias: Secondary | ICD-10-CM

## 2014-02-27 DIAGNOSIS — I872 Venous insufficiency (chronic) (peripheral): Secondary | ICD-10-CM | POA: Diagnosis present

## 2014-02-27 DIAGNOSIS — L97909 Non-pressure chronic ulcer of unspecified part of unspecified lower leg with unspecified severity: Secondary | ICD-10-CM | POA: Diagnosis present

## 2014-02-27 DIAGNOSIS — N400 Enlarged prostate without lower urinary tract symptoms: Secondary | ICD-10-CM | POA: Diagnosis present

## 2014-02-27 DIAGNOSIS — Z961 Presence of intraocular lens: Secondary | ICD-10-CM

## 2014-02-27 DIAGNOSIS — W19XXXA Unspecified fall, initial encounter: Secondary | ICD-10-CM

## 2014-02-27 DIAGNOSIS — R296 Repeated falls: Secondary | ICD-10-CM

## 2014-02-27 DIAGNOSIS — D126 Benign neoplasm of colon, unspecified: Secondary | ICD-10-CM

## 2014-02-27 DIAGNOSIS — Z96659 Presence of unspecified artificial knee joint: Secondary | ICD-10-CM

## 2014-02-27 DIAGNOSIS — S7292XA Unspecified fracture of left femur, initial encounter for closed fracture: Secondary | ICD-10-CM

## 2014-02-27 DIAGNOSIS — I10 Essential (primary) hypertension: Secondary | ICD-10-CM | POA: Diagnosis present

## 2014-02-27 DIAGNOSIS — M7989 Other specified soft tissue disorders: Secondary | ICD-10-CM

## 2014-02-27 DIAGNOSIS — S72109A Unspecified trochanteric fracture of unspecified femur, initial encounter for closed fracture: Secondary | ICD-10-CM | POA: Diagnosis present

## 2014-02-27 DIAGNOSIS — M799 Soft tissue disorder, unspecified: Secondary | ICD-10-CM

## 2014-02-27 LAB — BASIC METABOLIC PANEL
ANION GAP: 14 (ref 5–15)
BUN: 22 mg/dL (ref 6–23)
CALCIUM: 8.8 mg/dL (ref 8.4–10.5)
CO2: 21 mEq/L (ref 19–32)
Chloride: 104 mEq/L (ref 96–112)
Creatinine, Ser: 0.95 mg/dL (ref 0.50–1.35)
GFR, EST AFRICAN AMERICAN: 88 mL/min — AB (ref >90)
GFR, EST NON AFRICAN AMERICAN: 76 mL/min — AB (ref >90)
Glucose, Bld: 114 mg/dL — ABNORMAL HIGH (ref 70–99)
Potassium: 3.9 mEq/L (ref 3.7–5.3)
SODIUM: 139 meq/L (ref 137–147)

## 2014-02-27 LAB — CBC
HCT: 30.4 % — ABNORMAL LOW (ref 39.0–52.0)
HCT: 29.5 % — ABNORMAL LOW (ref 39.0–52.0)
HEMOGLOBIN: 10 g/dL — AB (ref 13.0–17.0)
Hemoglobin: 9.6 g/dL — ABNORMAL LOW (ref 13.0–17.0)
MCH: 29.7 pg (ref 26.0–34.0)
MCH: 29.5 pg (ref 26.0–34.0)
MCHC: 32.9 g/dL (ref 30.0–36.0)
MCHC: 32.5 g/dL (ref 30.0–36.0)
MCV: 90.2 fL (ref 78.0–100.0)
MCV: 90.8 fL (ref 78.0–100.0)
PLATELETS: 307 10*3/uL (ref 150–400)
PLATELETS: 291 10*3/uL (ref 150–400)
RBC: 3.37 MIL/uL — ABNORMAL LOW (ref 4.22–5.81)
RBC: 3.25 MIL/uL — AB (ref 4.22–5.81)
RDW: 13.4 % (ref 11.5–15.5)
RDW: 13.4 % (ref 11.5–15.5)
WBC: 6.6 10*3/uL (ref 4.0–10.5)
WBC: 5.4 10*3/uL (ref 4.0–10.5)

## 2014-02-27 LAB — TSH: TSH: 2.72 u[IU]/mL (ref 0.350–4.500)

## 2014-02-27 LAB — CREATININE, SERUM
CREATININE: 0.86 mg/dL (ref 0.50–1.35)
GFR calc Af Amer: >90 mL/min (ref >90)
GFR, EST NON AFRICAN AMERICAN: 79 mL/min — AB (ref >90)

## 2014-02-27 LAB — VITAMIN B12: VITAMIN B 12: 400 pg/mL (ref 211–911)

## 2014-02-27 LAB — FOLATE: Folate: >20 ng/mL

## 2014-02-27 MED ORDER — MORPHINE SULFATE 4 MG/ML IJ SOLN
4.0000 mg | Freq: Once | INTRAMUSCULAR | Status: AC
  Administered 2014-02-27: 4 mg via INTRAVENOUS
  Filled 2014-02-27: qty 1

## 2014-02-27 MED ORDER — INFLUENZA VAC SPLIT QUAD 0.5 ML IM SUSY
0.5000 mL | PREFILLED_SYRINGE | INTRAMUSCULAR | Status: AC
  Administered 2014-02-28: 0.5 mL via INTRAMUSCULAR
  Filled 2014-02-27: qty 0.5

## 2014-02-27 MED ORDER — PRAMIPEXOLE DIHYDROCHLORIDE 1 MG PO TABS
2.0000 mg | ORAL_TABLET | Freq: Two times a day (BID) | ORAL | Status: DC
  Administered 2014-02-28 – 2014-03-03 (×7): 2 mg via ORAL
  Filled 2014-02-27 (×10): qty 2

## 2014-02-27 MED ORDER — SODIUM CHLORIDE 0.9 % IJ SOLN: 3.0000 mL | INTRAMUSCULAR | Status: DC | PRN

## 2014-02-27 MED ORDER — OXYCODONE-ACETAMINOPHEN 5-325 MG PO TABS
2.0000 | ORAL_TABLET | Freq: Once | ORAL | Status: DC
  Filled 2014-02-27 (×2): qty 2

## 2014-02-27 MED ORDER — ACETAMINOPHEN 325 MG PO TABS
650.0000 mg | ORAL_TABLET | Freq: Four times a day (QID) | ORAL | Status: DC | PRN
  Administered 2014-02-28: 650 mg via ORAL
  Filled 2014-02-27 (×2): qty 2

## 2014-02-27 MED ORDER — MORPHINE SULFATE 2 MG/ML IJ SOLN: 2.0000 mg | Freq: Once | INTRAMUSCULAR | Status: DC

## 2014-02-27 MED ORDER — MORPHINE SULFATE 2 MG/ML IJ SOLN
2.0000 mg | INTRAMUSCULAR | Status: DC | PRN
  Administered 2014-02-28 – 2014-03-01 (×2): 2 mg via INTRAVENOUS
  Filled 2014-02-27 (×4): qty 1

## 2014-02-27 MED ORDER — LOSARTAN POTASSIUM 50 MG PO TABS
50.0000 mg | ORAL_TABLET | Freq: Every day | ORAL | Status: DC
  Administered 2014-02-28 – 2014-03-02 (×4): 50 mg via ORAL
  Filled 2014-02-27 (×5): qty 1

## 2014-02-27 MED ORDER — SODIUM CHLORIDE 0.9 % IJ SOLN
3.0000 mL | Freq: Two times a day (BID) | INTRAMUSCULAR | Status: DC
  Administered 2014-02-27 – 2014-03-03 (×8): 3 mL via INTRAVENOUS

## 2014-02-27 MED ORDER — B COMPLEX-C PO TABS
1.0000 | ORAL_TABLET | Freq: Every day | ORAL | Status: DC
  Administered 2014-02-28 – 2014-03-03 (×4): 1 via ORAL
  Filled 2014-02-27 (×4): qty 1

## 2014-02-27 MED ORDER — B COMPLEX PO TABS: 1.0000 | ORAL_TABLET | ORAL | Status: DC

## 2014-02-27 MED ORDER — ACETAMINOPHEN 650 MG RE SUPP: 650.0000 mg | Freq: Four times a day (QID) | RECTAL | Status: DC | PRN

## 2014-02-27 MED ORDER — CLOBETASOL PROPIONATE 0.05 % EX OINT
1.0000 "application " | TOPICAL_OINTMENT | Freq: Two times a day (BID) | CUTANEOUS | Status: DC
  Administered 2014-02-27 – 2014-03-02 (×6): 1 via TOPICAL
  Filled 2014-02-27: qty 15

## 2014-02-27 MED ORDER — HEPARIN SODIUM (PORCINE) 5000 UNIT/ML IJ SOLN
5000.0000 [IU] | Freq: Three times a day (TID) | INTRAMUSCULAR | Status: DC
  Administered 2014-02-27 – 2014-03-03 (×12): 5000 [IU] via SUBCUTANEOUS
  Filled 2014-02-27 (×13): qty 1

## 2014-02-27 MED ORDER — HYDROCODONE-ACETAMINOPHEN 5-325 MG PO TABS
1.0000 | ORAL_TABLET | ORAL | Status: DC | PRN
  Administered 2014-02-27 – 2014-03-02 (×13): 2 via ORAL
  Filled 2014-02-27 (×12): qty 2

## 2014-02-27 MED ORDER — PRAMIPEXOLE DIHYDROCHLORIDE 1 MG PO TABS: 1.0000 mg | ORAL_TABLET | Freq: Three times a day (TID) | ORAL | Status: DC

## 2014-02-27 MED ORDER — SODIUM CHLORIDE 0.9 % IV SOLN: 250.0000 mL | INTRAVENOUS | Status: DC | PRN

## 2014-02-27 MED ORDER — PRAMIPEXOLE DIHYDROCHLORIDE 1 MG PO TABS
1.0000 mg | ORAL_TABLET | Freq: Every day | ORAL | Status: DC
  Administered 2014-02-27 – 2014-03-02 (×4): 1 mg via ORAL
  Filled 2014-02-27 (×5): qty 1

## 2014-02-27 NOTE — Progress Notes (Signed)
VASCULAR LAB PRELIMINARY  PRELIMINARY  PRELIMINARY  PRELIMINARY  Bilateral lower extremity venous duplex completed.    Preliminary report:  Bilateral:  No evidence of DVT, superficial thrombosis, or Baker's Cyst.   Yosgart Pavey, RVS 02/27/2014, 9:33 AM

## 2014-02-27 NOTE — Consult Note (Addendum)
WOC wound consult note Reason for Consult: Consult requested for left leg.  Wife at bedside states he uses cream from a dermatologist to this site; this has already been ordered by the primary team. Wound type: Partial thickness dry stasis ulcers and scabbed areas. Measurement: Patchy areas to left calf, approx 2X2X.1cm and left foot 1X1X.1cm Wound bed:dry yellow Drainage (amount, consistency, odor) no odor or drainage Periwound: Scabbed dry skin surrounding. Dressing procedure/placement/frequency: Continue present plan of care with cream for topical treatment, patient can resume follow-up with dermatologist after discharge. Foam dressings to protect from further injury. Please re-consult if further assistance is needed.  Thank-you,  Julien Girt MSN, [place], [place], [place], [place]

## 2014-02-27 NOTE — H&P (Signed)
[facility] History and Physical  Derrick Sanchez:[id number] DOB: Nov 10, 1930 DOA: 02/27/2014  Referring physician:  PCP: [name], MD  Specialists:   Chief Complaint: Frequent falls  HPI: Derrick Sanchez is a 80 y.o. male  With a history of recent surgery- decompression of L4-L5 in August of 2015, hypertension, restless leg syndrome, that presented to the emergency department with his wife after falling. Patient has been falling more frequently. He does use a walker for ambulation. Patient stated he does not have dizziness or, he trips. Patient denied any headache, dizziness, loss of consciousness. Denies any recent travel or ill contacts. Patient did not go to rehabilitation after his back surgery. At the time of admission, patient complained of back pain as well as burning in his legs which has been chronic.  In the emergency department, patient received several x-rays of the femur, hip and lumbar spine, no fractures noted. TRH was asked to admit for pain control.  Review of Systems:  Constitutional: Denies fever, chills, diaphoresis, appetite change and fatigue.  HEENT: Denies photophobia, eye pain, redness, hearing loss, ear pain, congestion, sore throat, rhinorrhea, sneezing, mouth sores, trouble swallowing, neck pain, neck stiffness and tinnitus.   Respiratory: Denies SOB, DOE, cough, chest tightness,  and wheezing.   Cardiovascular: Denies chest pain, palpitations and leg swelling.  Gastrointestinal: Denies nausea, vomiting, abdominal pain, diarrhea, constipation, blood in stool and abdominal distention.  Genitourinary: Denies dysuria, urgency, frequency, hematuria, flank pain and difficulty urinating.  Musculoskeletal: Complains of back pain and burning in his legs.  Skin: Denies pallor, rash and wound.  Neurological: Complains of frequent falls and burning in his legs which is chronic. Hematological: Denies adenopathy. Easy bruising, personal or family bleeding history    Psychiatric/Behavioral: Denies suicidal ideation, mood changes, confusion, nervousness, sleep disturbance and agitation  Past Medical History  Diagnosis Date  . Obstructive sleep apnea (adult) (pediatric)   . Unspecified asthma(493.90)   . Hyperparathyroidism, unspecified   . Other vitamin B12 deficiency anemia   . Restless legs syndrome (RLS)   . Esophageal reflux 2008    EGD  . Diverticulosis of colon (without mention of hemorrhage) 2008,2006,2003    Colonoscopy  . Glaucoma   . Internal hemorrhoids without mention of complication 8841    Colonoscopy  . Candidiasis of the esophagus 07-17-2000    EGD  . Stricture and stenosis of esophagus 05-27-2002    EGD  . Colon polyps 05-27-2002    Tubulovillous Adenoma-Colonoscopy  . Hiatal hernia 2002,2003,2013    EGD  . Allergy   . Arthritis   . Blood transfusion   . Cataract     bil eyes  . Hypertension   . Pneumonia     years ago  . Multiple falls     Pt falls asleep without warning and has encounter multiple falls   Past Surgical History  Procedure Laterality Date  . Parathyroidectomy    . Total knee arthroplasty Left   . Hip surgery Right   . Colonoscopy    . Cataract extraction Bilateral   . Mass excision  10/14/2011    Procedure: EXCISION MASS;  Surgeon: Wynonia Sours, MD;  Location: [facility];  Service: Orthopedics;  Laterality: Left;   left hand  . Arm surgery    . Eye surgery Bilateral     cataracts iol   Social History:  reports that he has never smoked. He has never used smokeless tobacco. He reports that he does not drink alcohol or  use illicit drugs. Currently lives at home with his wife.  Uses a walker for ambulation.  No Known Allergies  Family History  Problem Relation Age of Onset  . Brain cancer      3 brothers  . Heart disease Father   . Heart disease Sister   . Colon cancer Neg Hx   . Esophageal cancer Neg Hx   . Rectal cancer Neg Hx   . Stomach cancer Neg Hx   . Leukemia Brother    . Diabetes Neg Hx   . Kidney disease Neg Hx   . Liver disease Neg Hx     Prior to Admission medications   Medication Sig Start Date End Date Taking? Authorizing Provider  b complex vitamins tablet Take 1 tablet by mouth every morning.    Yes Historical Provider, MD  clobetasol ointment (TEMOVATE) 2.95 % Apply 1 application topically 2 (two) times daily.   Yes Historical Provider, MD  HYDROcodone-acetaminophen (NORCO/VICODIN) 5-325 MG per tablet Take 1-2 tablets by mouth every 4 (four) hours as needed for moderate pain or severe pain (mild pain). 02/08/14  Yes Hosie Spangle, MD  losartan (COZAAR) 50 MG tablet Take 50 mg by mouth at bedtime.    Yes Historical Provider, MD  pramipexole (MIRAPEX) 1 MG tablet Take 1-2 mg by mouth 3 (three) times daily. Take 2 mg by mouth in the morning, take 1 mg by mouth at lunch time, and take 2 mg by mouth in the evening.   Yes Historical Provider, MD   Physical Exam: Filed Vitals:   02/27/14 1058  BP: 151/68  Pulse: 73  Temp:   Resp: 15     General: Well developed, well nourished, NAD, appears stated age  HEENT: NCAT, PERRLA, EOMI, Anicteic Sclera, mucous membranes dry   Cardiovascular: S1 S2 auscultated, no rubs, murmurs or gallops. Regular rate and rhythm.  Respiratory: Clear to auscultation bilaterally with equal chest rise  Abdomen: Soft, nontender, nondistended, + bowel sounds  Extremities: warm dry without cyanosis clubbing, LLE +2 edema with venous stasis changes, several wounds, erythema (unchanged according to patient)  Neuro: AAOx3, cranial nerves grossly intact. Strength equal and bilateral in upper/lower ext  Skin: Without rashes exudates or nodules  Psych: Normal affect and demeanor with intact judgement and insight  Labs on Admission:  Basic Metabolic Panel:  Recent Labs Lab 02/27/14 0757  NA 139  K 3.9  CL 104  CO2 21  GLUCOSE 114*  BUN 22  CREATININE 0.95  CALCIUM 8.8   Liver Function Tests: No results  found for this basename: AST, ALT, ALKPHOS, BILITOT, PROT, ALBUMIN,  in the last 168 hours No results found for this basename: LIPASE, AMYLASE,  in the last 168 hours No results found for this basename: AMMONIA,  in the last 168 hours CBC:  Recent Labs Lab 02/27/14 0757  WBC 6.6  HGB 10.0*  HCT 30.4*  MCV 90.2  PLT 307   Cardiac Enzymes: No results found for this basename: CKTOTAL, CKMB, CKMBINDEX, TROPONINI,  in the last 168 hours  BNP (last 3 results) No results found for this basename: PROBNP,  in the last 8760 hours CBG: No results found for this basename: GLUCAP,  in the last 168 hours  Radiological Exams on Admission: Dg Lumbar Spine Complete  02/27/2014   CLINICAL DATA:  Status post fall with back and right hip pain and recent back surgery  EXAM: LUMBAR SPINE - COMPLETE 4+ VIEW  COMPARISON:  Fluoro spot films from  L4-5 PLIF of February 06, 2014  FINDINGS: The metallic hardware at K9-9 is intact. The vertebral bodies are preserved in height. There is mild disc space narrowing at multiple levels. There is stable grade 1 anterolisthesis of L4 with respect L5.  IMPRESSION: There are extensive chronic and postsurgical changes of the lumbar spine but no acute bony abnormality is demonstrated.   Electronically Signed   By: David  Martinique   On: 02/27/2014 08:23   Dg Hip Complete Right  02/27/2014   CLINICAL DATA:  Status post fall now with low back and right hip pain.  EXAM: RIGHT HIP - COMPLETE 2+ VIEW  COMPARISON:  Right femur series of today's date.  FINDINGS: The prosthetic right hip joint appears to be appropriately positioned. The interface with the native bone is unremarkable. No acute fracture of the pelvis is demonstrated. There is lucency through the base of the neck of the left femur. This could reflect overlap of soft tissue structures but a fracture is not excluded here.  IMPRESSION: 1. There is no acute bony abnormality of the you right hip and the prosthesis is intact. 2. I  cannot exclude a fracture involving the base of the neck of the left femur. A left hip series is recommended unless the patient is absolutely asymptomatic.   Electronically Signed   By: David  Martinique   On: 02/27/2014 08:26   Dg Femur Right  02/27/2014   CLINICAL DATA:  Status post fall with right hip pain  EXAM: RIGHT FEMUR - 2 VIEW  COMPARISON:  Right hip series of today's date.  FINDINGS: The prosthetic right hip joint is appropriately positioned. The interface with the native bone is normal. The femoral shaft is intact. There are degenerative changes of the right knee. There is expansile remodeling of and sclerosis of the fibular head.  IMPRESSION: 1. There is no acute abnormality of the right femur. 2. There is mild to moderate degenerative change of the right knee joint. 3. Expansile remodeling of the fibular head may reflect an entity such as fibrous dysplasia but malignancy is not excluded. Further evaluation with a dedicated knee or fibular series is recommended.   Electronically Signed   By: David  Martinique   On: 02/27/2014 08:28    EKG: Independently reviewed. Sinus rhythm, rate 79  Assessment/Plan  Frequent falls with lower back pain -Patient admitted to medical floor for observation -Provide pain control as needed -PT will be consulted -Patient likely needs rehabilitation versus home health -Will check vitamin B 12, TSH, folate levels  Lower extremity wound and edema -Will consult wound care -Low extremity Doppler: Negative for DVT, superficial thrombosis or Baker cyst  Hypertension -Controlled -Continue losartan  Restless leg syndrome -Continue Mirapex  Normocytic anemia -Likely secondary to recent surgery -Will continue monitor CBC  Severe multifactorial lumbar stenosis status post decompression -Patient had surgery 02/06/2014 -Will neurosurgery note patient has been admitted  DVT prophylaxis: Heparin  Code Status: Full  Condition: Guarded  Family Communication:  Wife at bedside. Admission, patients condition and plan of care including tests being ordered have been discussed with the patient and wife who indicate understanding and agree with the plan and Code Status.  Disposition Plan: Admitted for observation  Time spent: 60 minutes  Fergie Sherbert D.O. [facility] Pager [phone]  If 7PM-7AM, please contact night-coverage [email or web address] Password [email or web address] 02/27/2014, 11:22 AM

## 2014-02-27 NOTE — ED Notes (Signed)
Pt states that he fell last night by tripping over his walker, and fell on his bottom, denies hitting head, denies LOC. Pt states that he fell again this AM by sliding off the couch onto his bottom again. Denies hitting head and LOC also.

## 2014-02-27 NOTE — ED Notes (Signed)
Attempted to call report to floor 

## 2014-02-27 NOTE — Progress Notes (Signed)
PT Cancellation Note  Patient Details Name: Derrick Sanchez MRN: [id number] DOB: 05/25/1931   Cancelled Treatment:    Reason Eval/Treat Not Completed: Medical issues which prohibited therapy.  Noted pt with Femur fx and awaiting Ortho Consult.  Will await Ortho recs.     Shamieka Gullo, Thornton Papas 02/27/2014, 1:53 PM

## 2014-02-27 NOTE — ED Provider Notes (Signed)
CSN: [id number]     Arrival date & time 02/27/14  0707 History   First MD Initiated Contact with Patient 02/27/14 [phone]     Chief Complaint  Patient presents with  . Fall     (Consider location/radiation/quality/duration/timing/severity/associated sxs/prior Treatment) HPI Comments: Patient reports tripping over his walker and falling last night; he reports falling straight down landing on his buttock.  He denies hitting his head or pain in his head/neck or hands.  He reports lower back pain and recent back surgery; Bilateral L4-5 lumbar decompression on 02/06/14 with L4-5 posterior lumbar interbody arthrodesis with AVS peek interbody implants.  He was able to be helped to his feet by a neighbor and was able to walk with assistance of his walker.  His pain was improved with two norco.  He reports rolling off the couch while asleep this morning and again, landing on his buttock. Additionally, he reports falling approximately one month ago and having chip from his left hip not requiring surgery. Has b/l knee replacements.  He denies any dizziness, lightheadedness, chest pain, or shortness of breath pior to falling.  He says his LE swelling has recently gotten worse and had his legs wrapped Tuesday. He reports his swelling is due to previous snake bite.    Patient is a 80 y.o. male presenting with fall. The history is provided by the patient.  Fall This is a new problem. The current episode started yesterday. The problem occurs intermittently. The problem has been unchanged. Associated symptoms include arthralgias and weakness. Pertinent negatives include no abdominal pain, chest pain, chills, congestion, diaphoresis, fatigue, fever, headaches, joint swelling, nausea, neck pain, numbness or vertigo. The symptoms are aggravated by standing, walking and twisting. He has tried oral narcotics for the symptoms. The treatment provided mild relief.    Past Medical History  Diagnosis Date  . Obstructive sleep  apnea (adult) (pediatric)   . Unspecified asthma(493.90)   . Hyperparathyroidism, unspecified   . Other vitamin B12 deficiency anemia   . Restless legs syndrome (RLS)   . Esophageal reflux 2008    EGD  . Diverticulosis of colon (without mention of hemorrhage) 2008,2006,2003    Colonoscopy  . Glaucoma   . Internal hemorrhoids without mention of complication 6578    Colonoscopy  . Candidiasis of the esophagus 07-17-2000    EGD  . Stricture and stenosis of esophagus 05-27-2002    EGD  . Colon polyps 05-27-2002    Tubulovillous Adenoma-Colonoscopy  . Hiatal hernia 2002,2003,2013    EGD  . Allergy   . Arthritis   . Blood transfusion   . Cataract     bil eyes  . Hypertension   . Pneumonia     years ago  . Multiple falls     Pt falls asleep without warning and has encounter multiple falls   Past Surgical History  Procedure Laterality Date  . Parathyroidectomy    . Total knee arthroplasty Left   . Hip surgery Right   . Colonoscopy    . Cataract extraction Bilateral   . Mass excision  10/14/2011    Procedure: EXCISION MASS;  Surgeon: Wynonia Sours, MD;  Location: [facility];  Service: Orthopedics;  Laterality: Left;   left hand  . Arm surgery    . Eye surgery Bilateral     cataracts iol   Family History  Problem Relation Age of Onset  . Brain cancer      3 brothers  . Heart disease Father   .  Heart disease Sister   . Colon cancer Neg Hx   . Esophageal cancer Neg Hx   . Rectal cancer Neg Hx   . Stomach cancer Neg Hx   . Leukemia Brother   . Diabetes Neg Hx   . Kidney disease Neg Hx   . Liver disease Neg Hx    History  Substance Use Topics  . Smoking status: Never Smoker   . Smokeless tobacco: Never Used  . Alcohol Use: No    Review of Systems  Constitutional: Negative for fever, chills, diaphoresis and fatigue.  HENT: Negative for congestion.   Eyes: Negative for visual disturbance.  Respiratory: Negative.  Negative for shortness of breath.    Cardiovascular: Positive for leg swelling. Negative for chest pain and palpitations.  Gastrointestinal: Negative for nausea, abdominal pain and blood in stool.  Genitourinary: Negative for flank pain.  Musculoskeletal: Positive for arthralgias. Negative for joint swelling and neck pain.  Skin: Negative for pallor.  Neurological: Positive for weakness. Negative for dizziness, vertigo, syncope, facial asymmetry, speech difficulty, light-headedness, numbness and headaches.      Allergies  Review of patient's allergies indicates no known allergies.  Home Medications   Prior to Admission medications   Medication Sig Start Date End Date Taking? Authorizing Provider  b complex vitamins tablet Take 1 tablet by mouth every morning.    Yes Historical Provider, MD  clobetasol ointment (TEMOVATE) 7.34 % Apply 1 application topically 2 (two) times daily.   Yes Historical Provider, MD  HYDROcodone-acetaminophen (NORCO/VICODIN) 5-325 MG per tablet Take 1-2 tablets by mouth every 4 (four) hours as needed for moderate pain or severe pain (mild pain). 02/08/14  Yes Hosie Spangle, MD  losartan (COZAAR) 50 MG tablet Take 50 mg by mouth at bedtime.    Yes Historical Provider, MD  pramipexole (MIRAPEX) 1 MG tablet Take 1-2 mg by mouth 3 (three) times daily. Take 2 mg by mouth in the morning, take 1 mg by mouth at lunch time, and take 2 mg by mouth in the evening.   Yes Historical Provider, MD   BP 152/71  Pulse 76  Temp(Src) 97.9 F (36.6 C) (Oral)  Resp 17  Ht 6' (1.829 m)  Wt 165 lb (74.844 kg)  BMI 22.37 kg/m2  SpO2 98% Physical Exam  Constitutional: He is oriented to person, place, and time. He appears well-developed.  HENT:  Head: Normocephalic and atraumatic.  Mouth/Throat: Oropharynx is clear and moist.  Eyes: EOM are normal. Pupils are equal, round, and reactive to light.  Neck: Normal range of motion. Neck supple.  Cardiovascular: Normal rate and regular rhythm.   No murmur  heard. Pulmonary/Chest: Effort normal. No respiratory distress. He has no wheezes.  Abdominal: Soft. Bowel sounds are normal. There is no tenderness.  Musculoskeletal: He exhibits tenderness.  Diffuse lumbar pain: both spinal processes and paraspinal.  Pelvis stable.  Right hip pain with femur rotation.  Lower extremity strength 4-5 bilaterally 2/2 back pain.  Lower extremity sensation and pulses intact  Neurological: He is alert and oriented to person, place, and time. No cranial nerve deficit.  Skin: Skin is warm.  Psychiatric: He has a normal mood and affect.    ED Course  Procedures (including critical care time) Labs Review Labs Reviewed  CBC - Abnormal; Notable for the following:    RBC 3.37 (*)    Hemoglobin 10.0 (*)    HCT 30.4 (*)    All other components within normal limits  BASIC METABOLIC PANEL -  Abnormal; Notable for the following:    Glucose, Bld 114 (*)    GFR calc non Af Amer 76 (*)    GFR calc Af Amer 88 (*)    All other components within normal limits    Imaging Review Dg Lumbar Spine Complete  02/27/2014   CLINICAL DATA:  Status post fall with back and right hip pain and recent back surgery  EXAM: LUMBAR SPINE - COMPLETE 4+ VIEW  COMPARISON:  Fluoro spot films from L4-5 PLIF of February 06, 2014  FINDINGS: The metallic hardware at J6-7 is intact. The vertebral bodies are preserved in height. There is mild disc space narrowing at multiple levels. There is stable grade 1 anterolisthesis of L4 with respect L5.  IMPRESSION: There are extensive chronic and postsurgical changes of the lumbar spine but no acute bony abnormality is demonstrated.   Electronically Signed   By: David  Martinique   On: 02/27/2014 08:23   Dg Hip Complete Right  02/27/2014   CLINICAL DATA:  Status post fall now with low back and right hip pain.  EXAM: RIGHT HIP - COMPLETE 2+ VIEW  COMPARISON:  Right femur series of today's date.  FINDINGS: The prosthetic right hip joint appears to be appropriately  positioned. The interface with the native bone is unremarkable. No acute fracture of the pelvis is demonstrated. There is lucency through the base of the neck of the left femur. This could reflect overlap of soft tissue structures but a fracture is not excluded here.  IMPRESSION: 1. There is no acute bony abnormality of the you right hip and the prosthesis is intact. 2. I cannot exclude a fracture involving the base of the neck of the left femur. A left hip series is recommended unless the patient is absolutely asymptomatic.   Electronically Signed   By: David  Martinique   On: 02/27/2014 08:26   Dg Femur Right  02/27/2014   CLINICAL DATA:  Status post fall with right hip pain  EXAM: RIGHT FEMUR - 2 VIEW  COMPARISON:  Right hip series of today's date.  FINDINGS: The prosthetic right hip joint is appropriately positioned. The interface with the native bone is normal. The femoral shaft is intact. There are degenerative changes of the right knee. There is expansile remodeling of and sclerosis of the fibular head.  IMPRESSION: 1. There is no acute abnormality of the right femur. 2. There is mild to moderate degenerative change of the right knee joint. 3. Expansile remodeling of the fibular head may reflect an entity such as fibrous dysplasia but malignancy is not excluded. Further evaluation with a dedicated knee or fibular series is recommended.   Electronically Signed   By: David  Martinique   On: 02/27/2014 08:28   Ct Lumbar Spine Wo Contrast  02/27/2014   CLINICAL DATA:  Severe back pain after fall.  EXAM: CT LUMBAR SPINE WITHOUT CONTRAST  TECHNIQUE: Multidetector CT imaging of the lumbar spine was performed without intravenous contrast administration. Multiplanar CT image reconstructions were also generated.  COMPARISON:  Same day.  FINDINGS: Status post surgical posterior fusion of L4 and L5 with bilateral intrapedicular screw placement. Grade 1 anterolisthesis of L4-5 is noted. Severe degenerative disc disease is  also noted at L4-5 and L5-S1 mild degenerative disc disease is noted at L1-2 and L2-3 with anterior osteophyte formation. Atherosclerotic calcifications of abdominal aorta are noted without aneurysm formation. No acute fracture is noted.  IMPRESSION: Postsurgical and degenerative changes are noted as described above. No acute abnormality  seen in the lumbar spine.   Electronically Signed   By: Sabino Dick M.D.   On: 02/27/2014 11:26   Ct Pelvis Wo Contrast  02/27/2014   CLINICAL DATA:  Pain post trauma  EXAM: CT PELVIS WITHOUT CONTRAST  TECHNIQUE: Multidetector CT imaging of the pelvis was performed following the standard protocol without intravenous contrast.  COMPARISON:  CT left hip November 22, 2013  FINDINGS: There is currently a comminuted fracture of the left greater trochanter with the left greater trochanter appearing somewhat fragmented. The greater trochanter is displaced superiorly, posteriorly, and medially compared to the remainder the femur with approximately 3 cm of displacement, increased from prior study. There is a total hip prosthesis on the right which appears well seated. No other fracture is currently appreciable. No dislocation. There is mild narrowing of the left hip joint.  In the visualized lumbar spine, there is postoperative change at L4 and L5. There is marked arthropathy at L4-5 at L5-S1. There is 9 mm of anterolisthesis of L4 on L5.  In the pelvis, there are multiple sigmoid diverticula without diverticulitis. There is no pelvic mass or fluid. There is atherosclerotic change noted in the aorta and iliac arteries. Prostate appears rather prominent with several calcifications within the prostate.  IMPRESSION: Comminuted fracture of the greater trochanter of the left femur with increased displacement and fragmentation of the greater trochanter compared to prior study.  Marked arthropathy in the visualized lumbar spine. Postoperative changes noted in this area. There is spondylolisthesis at  L4-5.  Total hip replacement on the right.  Prosthesis appears well-seated.  Sigmoid diverticulosis without diverticulitis. Prostate prominent. This finding may warrant correlation with PSA.   Electronically Signed   By: Lowella Grip M.D.   On: 02/27/2014 11:28     EKG Interpretation None      MDM   Final diagnoses:  Fall at home, initial encounter  Postural imbalance  Femur fracture, left, closed, initial encounter   Patient presents with lower back pain after fall status post recent lumbar surgery and previous right hip replacement.  X-rays negative for fracture or hardware malalignment.  Patient unable to stand or bear weight in ED with difficult to control pain; CT lumbar and pelvis revealed comminuted fracture of the greater trochanter of the left femur. Patient  admitted for ongoing pain management and ortho/physical therapy evaluation.  Extremity Dopplers obtained due to 2 recent worsening LE edema left > right.     Olam Idler, MD 02/27/14 1154  Medical screening examination/treatment/procedure(s) were conducted as a shared visit with non-physician practitioner(s) or resident and myself. I personally evaluated the patient during the encounter and agree with the findings.  I have personally reviewed any xrays and/ or EKG's with the provider and I agree with interpretation.  Patient presents with lower back and right hip and buttocks pain since falling onto his buttocks prior to arrival. Patient had no head injury, denies lightheadedness, chest pain, shortness of breath or syncope. Patient was using his walker and feels he got caught up in it, patient recalls all details. Patient has moderate tenderness lumbar or lower midline worse right paraspinal and right posterior hip and Vioxx. Patient has full range of motion hips with mild discomfort right side. No focal knee or ankle pain. Patient does have equal strength bilateral lower extremities however difficult exam due to pain.  Sensation lower extremities intact. With recent anemia, recent surgery plan for screening blood work. Patient has left lower extremity swelling for which he  says is more severe than his normal. With recent surgery plan for ultrasound to rule out blood clot. Lumbar surgical scar intact, no discharge or erythema.  Fall, postop, left lower extremity swelling Final diagnoses:  Fall at home, initial encounter  Postural imbalance  Femur fracture, left, closed, initial encounter     Mariea Clonts, MD 02/27/14 [phone]

## 2014-02-27 NOTE — Progress Notes (Signed)
ADDENDUM CT pelvis: shows: Comminuted fracture of the greater trochanter of the left femur with increased displacement and fragmentation of the greater trochanter compared to prior study.  Will consult orthopedics.

## 2014-02-28 ENCOUNTER — Inpatient Hospital Stay (HOSPITAL_COMMUNITY): Payer: Medicare Other

## 2014-02-28 DIAGNOSIS — S72109A Unspecified trochanteric fracture of unspecified femur, initial encounter for closed fracture: Secondary | ICD-10-CM | POA: Diagnosis not present

## 2014-02-28 LAB — TYPE AND SCREEN
ABO/RH(D): O NEG
Antibody Screen: NEGATIVE
UNIT DIVISION: 0
Unit division: 0

## 2014-02-28 LAB — BASIC METABOLIC PANEL
Anion gap: 13 (ref 5–15)
BUN: 16 mg/dL (ref 6–23)
CALCIUM: 8.6 mg/dL (ref 8.4–10.5)
CO2: 22 mEq/L (ref 19–32)
Chloride: 103 mEq/L (ref 96–112)
Creatinine, Ser: 0.81 mg/dL (ref 0.50–1.35)
GFR, EST NON AFRICAN AMERICAN: 81 mL/min — AB (ref >90)
GLUCOSE: 98 mg/dL (ref 70–99)
Potassium: 4 mEq/L (ref 3.7–5.3)
Sodium: 138 mEq/L (ref 137–147)

## 2014-02-28 LAB — CBC
HCT: 31.2 % — ABNORMAL LOW (ref 39.0–52.0)
Hemoglobin: 10 g/dL — ABNORMAL LOW (ref 13.0–17.0)
MCH: 29.2 pg (ref 26.0–34.0)
MCHC: 32.1 g/dL (ref 30.0–36.0)
MCV: 91 fL (ref 78.0–100.0)
PLATELETS: 323 10*3/uL (ref 150–400)
RBC: 3.43 MIL/uL — ABNORMAL LOW (ref 4.22–5.81)
RDW: 13.5 % (ref 11.5–15.5)
WBC: 5.4 10*3/uL (ref 4.0–10.5)

## 2014-02-28 LAB — PROTIME-INR
INR: 1.24 (ref 0.00–1.49)
Prothrombin Time: 15.6 seconds — ABNORMAL HIGH (ref 11.6–15.2)

## 2014-02-28 MED ORDER — HYDROCODONE-ACETAMINOPHEN 5-325 MG PO TABS: 1.0000 | ORAL_TABLET | ORAL | Status: DC | PRN

## 2014-02-28 MED ORDER — ENSURE COMPLETE PO LIQD
237.0000 mL | Freq: Two times a day (BID) | ORAL | Status: DC
  Administered 2014-02-28 – 2014-03-03 (×5): 237 mL via ORAL

## 2014-02-28 NOTE — Progress Notes (Addendum)
Patient Demographics  Derrick Sanchez, is a 80 y.o. male, DOB - 04-29-1931, YQI:[id number]  Admit date - 02/27/2014   Admitting Physician Cristal Ford, DO  Outpatient Primary MD for the patient is [name], MD  LOS - 1   Chief Complaint  Patient presents with  . Fall        Subjective:   [name] today has, No headache, No chest pain, No abdominal pain - No Nausea, No new weakness tingling or numbness, No Cough - SOB. ++ R hip pain.  Assessment & Plan    1. Poor balance with frequent mechanical Falls - R Femur Intertrochanteric fracture - seen by orthopedics, for now medical management only, PT to evaluate, but he recommended SNF and social worker thinks it can be arranged. Patient agreeable. We'll try to arrange for SNF placement.   2. Recent history of L4-L5 decompression surgery in August 2015. Stable. As needed pain control. Follow with neurosurgery outpatient as needed. Repeat x-ray here stable.    3. Enlarged prostate. Outpatient followup with PCP in urologist. Check PSA outpatient.    4. Enlarged right fibula noted on admission x-ray - ordered dedicated right fibula x-ray, follow results, likely will require outpatient close orthopedics followup for biopsy et Ronney Asters.   Addendum. I called and discussed this case with Dr. Percell Miller. He wants MRI of the right fibular head with and without contrast which has been ordered.    5. Chronic left lower extremity venous stasis wound. Negative for DVT or Baker's cyst. Wound care consulted.    6. Essential hypertension. On losartan continued.    7. Restless leg syndrome. On Mirapex    Code Status: Full  Family Communication: none present  Disposition Plan: SNF   Procedures CT scan hip, x-ray hip, MRI right fibular head  ordered as suggested by orthopedics physician Dr. Percell Miller.   Consults  Ortho Percell Miller   Medications  Scheduled Meds: . B-complex with vitamin C  1 tablet Oral Daily  . clobetasol ointment  1 application Topical BID  . heparin  5,000 Units Subcutaneous 3 times per day  . losartan  50 mg Oral QHS  . oxyCODONE-acetaminophen  2 tablet Oral Once  . pramipexole  2 mg Oral BID   And  . pramipexole  1 mg Oral Q lunch  . sodium chloride  3 mL Intravenous Q12H   Continuous Infusions:  PRN Meds:.sodium chloride, acetaminophen, acetaminophen, HYDROcodone-acetaminophen, morphine injection, sodium chloride  DVT Prophylaxis  Heparin  Lab Results  Component Value Date   PLT 323 02/28/2014    Antibiotics    Anti-infectives   None          Objective:   Filed Vitals:   02/27/14 1328 02/27/14 2049 02/28/14 0510 02/28/14 0952  BP:  134/56 167/75 124/58  Pulse:  65 70 73  Temp:  98.4 F (36.9 C) 98.6 F (37 C) 98.1 F (36.7 C)  TempSrc:  Oral Oral Oral  Resp:  18 18 18   Height: 6' (1.829 m)     Weight: 69.2 kg (152 lb 8.9 oz) 69.5 kg (153 lb 3.5 oz)    SpO2:  97% 99% 98%    Wt Readings from Last 3 Encounters:  02/27/14 69.5 kg (153 lb 3.5  oz)  02/06/14 70.393 kg (155 lb 3 oz)  02/06/14 70.393 kg (155 lb 3 oz)     Intake/Output Summary (Last 24 hours) at 02/28/14 1113 Last data filed at 02/28/14 0953  Gross per 24 hour  Intake    240 ml  Output   1160 ml  Net   -920 ml     Physical Exam  Awake Alert, Oriented X 3, No new F.N deficits, Normal affect [place].AT,PERRAL Supple Neck,No JVD, No cervical lymphadenopathy appriciated.  Symmetrical Chest wall movement, Good air movement bilaterally, CTAB RRR,No Gallops,Rubs or new Murmurs, No Parasternal Heave +ve B.Sounds, Abd Soft, No tenderness, No organomegaly appriciated, No rebound - guarding or rigidity. No Cyanosis, Clubbing or edema, No new Rash or bruise, R leg internally rotated   Data Review   Micro  Results No results found for this or any previous visit (from the past 240 hour(s)).  Radiology Reports    Dg Lumbar Spine Complete  02/27/2014   CLINICAL DATA:  Status post fall with back and right hip pain and recent back surgery  EXAM: LUMBAR SPINE - COMPLETE 4+ VIEW  COMPARISON:  Fluoro spot films from L4-5 PLIF of February 06, 2014  FINDINGS: The metallic hardware at J6-2 is intact. The vertebral bodies are preserved in height. There is mild disc space narrowing at multiple levels. There is stable grade 1 anterolisthesis of L4 with respect L5.  IMPRESSION: There are extensive chronic and postsurgical changes of the lumbar spine but no acute bony abnormality is demonstrated.   Electronically Signed   By: David  Martinique   On: 02/27/2014 08:23   Dg Hip Complete Right  02/27/2014   CLINICAL DATA:  Status post fall now with low back and right hip pain.  EXAM: RIGHT HIP - COMPLETE 2+ VIEW  COMPARISON:  Right femur series of today's date.  FINDINGS: The prosthetic right hip joint appears to be appropriately positioned. The interface with the native bone is unremarkable. No acute fracture of the pelvis is demonstrated. There is lucency through the base of the neck of the left femur. This could reflect overlap of soft tissue structures but a fracture is not excluded here.  IMPRESSION: 1. There is no acute bony abnormality of the you right hip and the prosthesis is intact. 2. I cannot exclude a fracture involving the base of the neck of the left femur. A left hip series is recommended unless the patient is absolutely asymptomatic.   Electronically Signed   By: David  Martinique   On: 02/27/2014 08:26   Dg Femur Right  02/27/2014   CLINICAL DATA:  Status post fall with right hip pain  EXAM: RIGHT FEMUR - 2 VIEW  COMPARISON:  Right hip series of today's date.  FINDINGS: The prosthetic right hip joint is appropriately positioned. The interface with the native bone is normal. The femoral shaft is intact. There are  degenerative changes of the right knee. There is expansile remodeling of and sclerosis of the fibular head.  IMPRESSION: 1. There is no acute abnormality of the right femur. 2. There is mild to moderate degenerative change of the right knee joint. 3. Expansile remodeling of the fibular head may reflect an entity such as fibrous dysplasia but malignancy is not excluded. Further evaluation with a dedicated knee or fibular series is recommended.   Electronically Signed   By: David  Martinique   On: 02/27/2014 08:28      Ct Lumbar Spine Wo Contrast  02/27/2014   CLINICAL  DATA:  Severe back pain after fall.  EXAM: CT LUMBAR SPINE WITHOUT CONTRAST  TECHNIQUE: Multidetector CT imaging of the lumbar spine was performed without intravenous contrast administration. Multiplanar CT image reconstructions were also generated.  COMPARISON:  Same day.  FINDINGS: Status post surgical posterior fusion of L4 and L5 with bilateral intrapedicular screw placement. Grade 1 anterolisthesis of L4-5 is noted. Severe degenerative disc disease is also noted at L4-5 and L5-S1 mild degenerative disc disease is noted at L1-2 and L2-3 with anterior osteophyte formation. Atherosclerotic calcifications of abdominal aorta are noted without aneurysm formation. No acute fracture is noted.  IMPRESSION: Postsurgical and degenerative changes are noted as described above. No acute abnormality seen in the lumbar spine.   Electronically Signed   By: Sabino Dick M.D.   On: 02/27/2014 11:26   Ct Pelvis Wo Contrast  02/27/2014   CLINICAL DATA:  Pain post trauma  EXAM: CT PELVIS WITHOUT CONTRAST  TECHNIQUE: Multidetector CT imaging of the pelvis was performed following the standard protocol without intravenous contrast.  COMPARISON:  CT left hip November 22, 2013  FINDINGS: There is currently a comminuted fracture of the left greater trochanter with the left greater trochanter appearing somewhat fragmented. The greater trochanter is displaced superiorly,  posteriorly, and medially compared to the remainder the femur with approximately 3 cm of displacement, increased from prior study. There is a total hip prosthesis on the right which appears well seated. No other fracture is currently appreciable. No dislocation. There is mild narrowing of the left hip joint.  In the visualized lumbar spine, there is postoperative change at L4 and L5. There is marked arthropathy at L4-5 at L5-S1. There is 9 mm of anterolisthesis of L4 on L5.  In the pelvis, there are multiple sigmoid diverticula without diverticulitis. There is no pelvic mass or fluid. There is atherosclerotic change noted in the aorta and iliac arteries. Prostate appears rather prominent with several calcifications within the prostate.  IMPRESSION: Comminuted fracture of the greater trochanter of the left femur with increased displacement and fragmentation of the greater trochanter compared to prior study.  Marked arthropathy in the visualized lumbar spine. Postoperative changes noted in this area. There is spondylolisthesis at L4-5.  Total hip replacement on the right.  Prosthesis appears well-seated.  Sigmoid diverticulosis without diverticulitis. Prostate prominent. This finding may warrant correlation with PSA.   Electronically Signed   By: Lowella Grip M.D.   On: 02/27/2014 11:28        CBC  Recent Labs Lab 02/27/14 0757 02/27/14 1300 02/28/14 0534  WBC 6.6 5.4 5.4  HGB 10.0* 9.6* 10.0*  HCT 30.4* 29.5* 31.2*  PLT 307 291 323  MCV 90.2 90.8 91.0  MCH 29.7 29.5 29.2  MCHC 32.9 32.5 32.1  RDW 13.4 13.4 13.5    Chemistries   Recent Labs Lab 02/27/14 0757 02/27/14 1300 02/28/14 0534  NA 139  --  138  K 3.9  --  4.0  CL 104  --  103  CO2 21  --  22  GLUCOSE 114*  --  98  BUN 22  --  16  CREATININE 0.95 0.86 0.81  CALCIUM 8.8  --  8.6   ------------------------------------------------------------------------------------------------------------------ estimated creatinine  clearance is 70.3 ml/min (by C-G formula based on Cr of 0.81). ------------------------------------------------------------------------------------------------------------------ No results found for this basename: HGBA1C,  in the last 72 hours ------------------------------------------------------------------------------------------------------------------ No results found for this basename: CHOL, HDL, LDLCALC, TRIG, CHOLHDL, LDLDIRECT,  in the last 72 hours ------------------------------------------------------------------------------------------------------------------  Recent Labs  02/27/14 1300  TSH 2.720   ------------------------------------------------------------------------------------------------------------------  Recent Labs  02/27/14 1300  VITAMINB12 400  FOLATE >20.0    Coagulation profile  Recent Labs Lab 02/28/14 0800  INR 1.24    No results found for this basename: DDIMER,  in the last 72 hours  Cardiac Enzymes No results found for this basename: CK, CKMB, TROPONINI, MYOGLOBIN,  in the last 168 hours ------------------------------------------------------------------------------------------------------------------ No components found with this basename: POCBNP,      Time Spent in minutes   35   Lala Lund K M.D on 02/28/2014 at 11:13 AM  Between 7am to 7pm - Pager - [phone]  After 7pm go to [email or web address] - password [email or web address]  And look for the night coverage person covering for me after hours  [facility] Office  [phone]   **Disclaimer: This note may have been dictated with voice recognition software. Similar sounding words can inadvertently be transcribed and this note may contain transcription errors which may not have been corrected upon publication of note.**

## 2014-02-28 NOTE — Progress Notes (Signed)
Utilization Review Completed.Shaelyn Decarli T9/15/2015  

## 2014-02-28 NOTE — Clinical Social Work Note (Signed)
CSW received consult for SNF placement and talked with patient and wife (full assessment to follow) and they are agreeable to [facility].  Derrick Sanchez, MSW, LCSW [phone]

## 2014-02-28 NOTE — Consult Note (Signed)
ORTHOPAEDIC CONSULTATION  REQUESTING PHYSICIAN: Thurnell Lose, MD  Chief Complaint: "My back is hurting me"  HPI: Derrick Sanchez is a 80 y.o. male who complains of back and hip pain for the past two days.  Patient states that two nights ago he tripped over his walker and landed on his buttocks.  He began to have pain in his lower back and left lateral hip.  No anterior hip or thigh pain. Hippain was resolved with Norco, but he continues to complain of lower back pain.  He is however still able to ambulate with the assistance of his walker.  No radicular symptoms noted.  No previous surgical intervention on the left, however, he is nearly 20 years s/p right total hip replacement.He admits to recent surgical intervention on 02/06/14 on his lower back to include bilateral L4-L5 lumbar decompression with L4-L5  Posterior lumbar interbody arthrodesis with AVS peek interbody implants.    Past Medical History  Diagnosis Date  . Obstructive sleep apnea (adult) (pediatric)   . Unspecified asthma(493.90)   . Hyperparathyroidism, unspecified   . Other vitamin B12 deficiency anemia   . Restless legs syndrome (RLS)   . Esophageal reflux 2008    EGD  . Diverticulosis of colon (without mention of hemorrhage) 2008,2006,2003    Colonoscopy  . Glaucoma   . Internal hemorrhoids without mention of complication 1735    Colonoscopy  . Candidiasis of the esophagus 07-17-2000    EGD  . Stricture and stenosis of esophagus 05-27-2002    EGD  . Colon polyps 05-27-2002    Tubulovillous Adenoma-Colonoscopy  . Hiatal hernia 2002,2003,2013    EGD  . Allergy   . Arthritis   . Blood transfusion   . Cataract     bil eyes  . Hypertension   . Pneumonia     years ago  . Multiple falls     Pt falls asleep without warning and has encounter multiple falls   Past Surgical History  Procedure Laterality Date  . Parathyroidectomy    . Total knee arthroplasty Left   . Hip surgery Right   . Colonoscopy      . Cataract extraction Bilateral   . Mass excision  10/14/2011    Procedure: EXCISION MASS;  Surgeon: Wynonia Sours, MD;  Location: [facility];  Service: Orthopedics;  Laterality: Left;   left hand  . Arm surgery    . Eye surgery Bilateral     cataracts iol   History   Social History  . Marital Status: Married    Spouse Name: N/A    Number of Children: 1  . Years of Education: N/A   Occupational History  . Retired    Social History Main Topics  . Smoking status: Never Smoker   . Smokeless tobacco: Never Used  . Alcohol Use: No  . Drug Use: No  . Sexual Activity: None   Other Topics Concern  . None   Social History Narrative  . None   Family History  Problem Relation Age of Onset  . Brain cancer      3 brothers  . Heart disease Father   . Heart disease Sister   . Colon cancer Neg Hx   . Esophageal cancer Neg Hx   . Rectal cancer Neg Hx   . Stomach cancer Neg Hx   . Leukemia Brother   . Diabetes Neg Hx   . Kidney disease Neg Hx   . Liver  disease Neg Hx    No Known Allergies Prior to Admission medications   Medication Sig Start Date End Date Taking? Authorizing Provider  b complex vitamins tablet Take 1 tablet by mouth every morning.    Yes Historical Provider, MD  clobetasol ointment (TEMOVATE) 9.60 % Apply 1 application topically 2 (two) times daily.   Yes Historical Provider, MD  HYDROcodone-acetaminophen (NORCO/VICODIN) 5-325 MG per tablet Take 1-2 tablets by mouth every 4 (four) hours as needed for moderate pain or severe pain (mild pain). 02/08/14  Yes Hosie Spangle, MD  losartan (COZAAR) 50 MG tablet Take 50 mg by mouth at bedtime.    Yes Historical Provider, MD  pramipexole (MIRAPEX) 1 MG tablet Take 1-2 mg by mouth 3 (three) times daily. Take 2 mg by mouth in the morning, take 1 mg by mouth at lunch time, and take 2 mg by mouth in the evening.   Yes Historical Provider, MD   Dg Lumbar Spine Complete  02/27/2014   CLINICAL DATA:  Status  post fall with back and right hip pain and recent back surgery  EXAM: LUMBAR SPINE - COMPLETE 4+ VIEW  COMPARISON:  Fluoro spot films from L4-5 PLIF of February 06, 2014  FINDINGS: The metallic hardware at A5-4 is intact. The vertebral bodies are preserved in height. There is mild disc space narrowing at multiple levels. There is stable grade 1 anterolisthesis of L4 with respect L5.  IMPRESSION: There are extensive chronic and postsurgical changes of the lumbar spine but no acute bony abnormality is demonstrated.   Electronically Signed   By: David  Martinique   On: 02/27/2014 08:23   Dg Hip Complete Right  02/27/2014   CLINICAL DATA:  Status post fall now with low back and right hip pain.  EXAM: RIGHT HIP - COMPLETE 2+ VIEW  COMPARISON:  Right femur series of today's date.  FINDINGS: The prosthetic right hip joint appears to be appropriately positioned. The interface with the native bone is unremarkable. No acute fracture of the pelvis is demonstrated. There is lucency through the base of the neck of the left femur. This could reflect overlap of soft tissue structures but a fracture is not excluded here.  IMPRESSION: 1. There is no acute bony abnormality of the you right hip and the prosthesis is intact. 2. I cannot exclude a fracture involving the base of the neck of the left femur. A left hip series is recommended unless the patient is absolutely asymptomatic.   Electronically Signed   By: David  Martinique   On: 02/27/2014 08:26   Dg Femur Right  02/27/2014   CLINICAL DATA:  Status post fall with right hip pain  EXAM: RIGHT FEMUR - 2 VIEW  COMPARISON:  Right hip series of today's date.  FINDINGS: The prosthetic right hip joint is appropriately positioned. The interface with the native bone is normal. The femoral shaft is intact. There are degenerative changes of the right knee. There is expansile remodeling of and sclerosis of the fibular head.  IMPRESSION: 1. There is no acute abnormality of the right femur. 2.  There is mild to moderate degenerative change of the right knee joint. 3. Expansile remodeling of the fibular head may reflect an entity such as fibrous dysplasia but malignancy is not excluded. Further evaluation with a dedicated knee or fibular series is recommended.   Electronically Signed   By: David  Martinique   On: 02/27/2014 08:28   Ct Lumbar Spine Wo Contrast  02/27/2014  CLINICAL DATA:  Severe back pain after fall.  EXAM: CT LUMBAR SPINE WITHOUT CONTRAST  TECHNIQUE: Multidetector CT imaging of the lumbar spine was performed without intravenous contrast administration. Multiplanar CT image reconstructions were also generated.  COMPARISON:  Same day.  FINDINGS: Status post surgical posterior fusion of L4 and L5 with bilateral intrapedicular screw placement. Grade 1 anterolisthesis of L4-5 is noted. Severe degenerative disc disease is also noted at L4-5 and L5-S1 mild degenerative disc disease is noted at L1-2 and L2-3 with anterior osteophyte formation. Atherosclerotic calcifications of abdominal aorta are noted without aneurysm formation. No acute fracture is noted.  IMPRESSION: Postsurgical and degenerative changes are noted as described above. No acute abnormality seen in the lumbar spine.   Electronically Signed   By: Sabino Dick M.D.   On: 02/27/2014 11:26   Ct Pelvis Wo Contrast  02/27/2014   CLINICAL DATA:  Pain post trauma  EXAM: CT PELVIS WITHOUT CONTRAST  TECHNIQUE: Multidetector CT imaging of the pelvis was performed following the standard protocol without intravenous contrast.  COMPARISON:  CT left hip November 22, 2013  FINDINGS: There is currently a comminuted fracture of the left greater trochanter with the left greater trochanter appearing somewhat fragmented. The greater trochanter is displaced superiorly, posteriorly, and medially compared to the remainder the femur with approximately 3 cm of displacement, increased from prior study. There is a total hip prosthesis on the right which appears  well seated. No other fracture is currently appreciable. No dislocation. There is mild narrowing of the left hip joint.  In the visualized lumbar spine, there is postoperative change at L4 and L5. There is marked arthropathy at L4-5 at L5-S1. There is 9 mm of anterolisthesis of L4 on L5.  In the pelvis, there are multiple sigmoid diverticula without diverticulitis. There is no pelvic mass or fluid. There is atherosclerotic change noted in the aorta and iliac arteries. Prostate appears rather prominent with several calcifications within the prostate.  IMPRESSION: Comminuted fracture of the greater trochanter of the left femur with increased displacement and fragmentation of the greater trochanter compared to prior study.  Marked arthropathy in the visualized lumbar spine. Postoperative changes noted in this area. There is spondylolisthesis at L4-5.  Total hip replacement on the right.  Prosthesis appears well-seated.  Sigmoid diverticulosis without diverticulitis. Prostate prominent. This finding may warrant correlation with PSA.   Electronically Signed   By: Lowella Grip M.D.   On: 02/27/2014 11:28    Positive ROS: All other systems have been reviewed and were otherwise negative with the exception of those mentioned in the HPI and as above.  Labs cbc  Recent Labs  02/27/14 1300 02/28/14 0534  WBC 5.4 5.4  HGB 9.6* 10.0*  HCT 29.5* 31.2*  PLT 291 323    Labs inflam No results found for this basename: ESR, CRP,  in the last 72 hours  Labs coag No results found for this basename: INR, PT, PTT,  in the last 72 hours   Recent Labs  02/27/14 0757 02/27/14 1300 02/28/14 0534  NA 139  --  138  K 3.9  --  4.0  CL 104  --  103  CO2 21  --  22  GLUCOSE 114*  --  98  BUN 22  --  16  CREATININE 0.95 0.86 0.81  CALCIUM 8.8  --  8.6    Physical Exam: Filed Vitals:   02/28/14 0510  BP: 167/75  Pulse: 70  Temp: 98.6 F (37 C)  Resp: 18   General: Alert, no acute  distress Cardiovascular: No pedal edema Respiratory: No cyanosis, no use of accessory musculature GI: No organomegaly, abdomen is soft and non-tender Skin: No lesions in the area of chief complaint Neurologic: Sensation intact distally Psychiatric: Patient is competent for consent with normal mood and affect Lymphatic: No axillary or cervical lymphadenopathy  MUSCULOSKELETAL:   Examination of LLE: No gross deformity noted.   Mild ttp greater trochanter Pain with resisted SLR Positive log roll Positive stitchfields 2+ pulses bilateral posterior tib Negative calf tenderness and negative homans bilaterally  Other extremities are atraumatic with painless ROM and NVI.  Assessment: Greater trochanter fracture,left femur  Plan: Weight Bearing Status: WBAT LLE PT for mobility VTE px: SCD's and chemical ppx per medicine Continue non-operative management Follow up in our clinic in two weeks   Larae Grooms, PA-C Cell [phone]   02/28/2014 7:42 AM

## 2014-02-28 NOTE — Evaluation (Addendum)
Physical Therapy Evaluation Patient Details Name: Derrick Sanchez MRN: [id number] DOB: 08/11/1930 Today's Date: 02/28/2014   History of Present Illness  80 y.o. male with h/o recent L4-L5 decompression on 02/06/14 admitted with multiple falls. Imaging showed L greater trochanter comminuted fx.  Clinical Impression  *Pt admitted with *comminuted L greater trochanter fx, falls*. Pt currently with functional limitations due to the deficits listed below (see PT Problem List).  Pt will benefit from skilled PT to increase their independence and safety with mobility to allow discharge to the venue listed below.   Pt walked 40' with min A for balance, mod assist for bed mobility. Ambulation distance limited by 10/10 LBP and R hip pain. Throughout evaluation pt reported R hip pain with mobility, however imaging showed fx in L hip. Due to 4 falls in past 3 weeks, pt would benefit from ST-SNF. He is agreeable to this.   *    Follow Up Recommendations SNF;Supervision for mobility/OOB    Equipment Recommendations  None recommended by PT    Recommendations for Other Services OT consult     Precautions / Restrictions Precautions Precautions: Fall Precaution Comments: Pt reports 4 falls in past 3 weeks (since back surgery), stated he trips on things, denies dizziness. Stated he was having falls prior to back surgery too.  Restrictions Weight Bearing Restrictions: Yes LLE Weight Bearing: Weight bearing as tolerated      Mobility  Bed Mobility Overal bed mobility: Needs Assistance Bed Mobility: Rolling;Sidelying to Sit Rolling: Mod assist Sidelying to sit: Min assist       General bed mobility comments: verbal and manual cues for technique and to initiate movement  Transfers Overall transfer level: Needs assistance Equipment used: Rolling walker (2 wheeled) Transfers: Sit to/from Stand Sit to Stand: Min assist            Ambulation/Gait Ambulation/Gait assistance: Min  guard Ambulation Distance (Feet): 40 Feet Assistive device: Rolling walker (2 wheeled) Gait Pattern/deviations: Step-to pattern;Trunk flexed;Decreased stance time - right;Antalgic   Gait velocity interpretation: Below normal speed for age/gender General Gait Details: distance limited by R hip pain (though imaging shows fx in L hip)  Stairs            Wheelchair Mobility    Modified Rankin (Stroke Patients Only)       Balance Overall balance assessment: Needs assistance Sitting-balance support: Bilateral upper extremity supported;Feet supported Sitting balance-Leahy Scale: Poor Sitting balance - Comments: requires BUE support   Standing balance support: Bilateral upper extremity supported Standing balance-Leahy Scale: Poor Standing balance comment: requires BUE support                             Pertinent Vitals/Pain Pain Assessment: 0-10 Pain Score: 10-Worst pain ever Pain Location: R hip with walking (though noted imaging showed fx in L hip) Pain Descriptors / Indicators: Tightness;Sore Pain Intervention(s): Monitored during session;Premedicated before session;Ice applied    Home Living Family/patient expects to be discharged to:: Private residence Living Arrangements: Spouse/significant other Available Help at Discharge: Family Type of Home: House Home Access: Stairs to enter Entrance Stairs-Rails: Right Entrance Stairs-Number of Steps: 2 Home Layout: Two level;Able to live on main level with bedroom/bathroom Home Equipment: Gilford Rile - 2 wheels;Electric scooter;Crutches;Shower seat;Bedside commode      Prior Function Level of Independence: Independent with assistive device(s)         Comments: used RW, but has had 4 falls in past 3 weeks  Hand Dominance        Extremity/Trunk Assessment   Upper Extremity Assessment: Overall WFL for tasks assessed           Lower Extremity Assessment: RLE deficits/detail RLE Deficits / Details:  R knee extension -3/5, R hip flexion 3/5, ankle WNL    Cervical / Trunk Assessment: Kyphotic  Communication   Communication: No difficulties  Cognition Arousal/Alertness: Awake/alert Behavior During Therapy: WFL for tasks assessed/performed Overall Cognitive Status: Within Functional Limits for tasks assessed                      General Comments      Exercises        Assessment/Plan    PT Assessment Patient needs continued PT services  PT Diagnosis Difficulty walking;Acute pain;Generalized weakness   PT Problem List Decreased strength;Decreased activity tolerance;Decreased balance;Pain;Decreased mobility;Decreased safety awareness  PT Treatment Interventions Gait training;Functional mobility training;Therapeutic activities;Patient/family education;Therapeutic exercise   PT Goals (Current goals can be found in the Care Plan section) Acute Rehab PT Goals Patient Stated Goal: return to independence with mobility, work on garden PT Goal Formulation: With patient Time For Goal Achievement: 03/14/14 Potential to Achieve Goals: Good    Frequency Min 4X/week   Barriers to discharge        Co-evaluation               End of Session Equipment Utilized During Treatment: Gait belt Activity Tolerance: Patient limited by pain Patient left: in chair;with call bell/phone within reach Nurse Communication: Mobility status    Functional Assessment Tool Used: clinical judgement Functional Limitation: Mobility: Walking and moving around Mobility: Walking and Moving Around Current Status [phone]): At least 40 percent but less than 60 percent impaired, limited or restricted Mobility: Walking and Moving Around Goal Status [phone]): At least 1 percent but less than 20 percent impaired, limited or restricted    Time: 1032-1057 PT Time Calculation (min): 25 min   Charges:   PT Evaluation $Initial PT Evaluation Tier I: 1 Procedure PT Treatments $Gait Training: 8-22 mins    PT G Codes:   Functional Assessment Tool Used: clinical judgement Functional Limitation: Mobility: Walking and moving around    [place], Derrick Sanchez 02/28/2014, 11:13 AM [phone]

## 2014-02-28 NOTE — Progress Notes (Signed)
INITIAL NUTRITION ASSESSMENT  Pt meets criteria for SEVERE MALNUTRITION in the context of chronic illness as evidenced by severe fat and muscle mass loss.  DOCUMENTATION CODES Per approved criteria  -Severe malnutrition in the context of chronic illness   INTERVENTION: Provide Ensure Complete po BID, each supplement provides 350 kcal and 13 grams of protein.  NUTRITION DIAGNOSIS: Malnutrition related to chronic illness as evidenced by severe fat and muscle mass loss.   Goal: Pt to meet >/= 90% of their estimated nutrition needs   Monitor:  PO intake, weight trends, labs, I/O's  Reason for Assessment: MST  80 y.o. male  Admitting Dx: Lower Back Pain  ASSESSMENT: Pt who complains of back and hip pain for the past two days. Patient states that two nights ago he tripped over his walker and landed on his buttocks. He began to have pain in his lower back and left lateral hip. No anterior hip or thigh pain. Hippain was resolved with Norco, but he continues to complain of lower back pain.  Pt reports he has had a decreased appetite, which has been present for the past 2 weeks, however pt has been able to still eat 3 full meals a day. Current meal completion has been 25-90%. Pt reports he has been gradually losing weight over the past year, with his usual body weight of 180 lbs. Noted pt with a 15% weight loss in one year, however is not found significant. Pt reports he is willing to try Ensure. Will order. Pt was educated to consider drinking oral supplements at home to help with extra calories and protein and to help prevent further weight loss.  Nutrition Focused Physical Exam:  Subcutaneous Fat:  Orbital Region: N/A Upper Arm Region: Severe depletion Thoracic and Lumbar Region: Severe depletion  Muscle:  Temple Region: Moderate depletion Clavicle Bone Region: Severe depletion Clavicle and Acromion Bone Region: Severe depletion Scapular Bone Region: N/A Dorsal Hand: Moderate  depletion Patellar Region: WNL Anterior Thigh Region: WNL Posterior Calf Region: WNL  Edema: non-pitting LE   Height: Ht Readings from Last 1 Encounters:  02/27/14 6' (1.829 m)    Weight: Wt Readings from Last 1 Encounters:  02/27/14 153 lb 3.5 oz (69.5 kg)    Ideal Body Weight: 178 lbs  % Ideal Body Weight: 86%  Wt Readings from Last 10 Encounters:  02/27/14 153 lb 3.5 oz (69.5 kg)  02/06/14 155 lb 3 oz (70.393 kg)  02/06/14 155 lb 3 oz (70.393 kg)  02/01/14 155 lb 3.3 oz (70.4 kg)  07/14/13 171 lb 12.8 oz (77.928 kg)  02/02/12 178 lb (80.74 kg)  01/30/12 180 lb 1.6 oz (81.693 kg)  01/21/12 178 lb (80.74 kg)  12/22/11 176 lb (79.833 kg)  12/11/11 176 lb 12.8 oz (80.196 kg)    Usual Body Weight: 180 lbs (pt reports 1 year ago)  % Usual Body Weight: 85%  BMI:  Body mass index is 20.78 kg/(m^2).  Estimated Nutritional Needs: Kcal: 1800-2000 Protein: 75-90 grams Fluid: 1.8 - 2 L/day  Skin: incision mid back, wound on leg, right elbow, left foot, non-pitting LE edema  Diet Order: Cardiac  EDUCATION NEEDS: -Education needs addressed   Intake/Output Summary (Last 24 hours) at 02/28/14 0927 Last data filed at 02/28/14 0313  Gross per 24 hour  Intake      0 ml  Output   1000 ml  Net  -1000 ml    Last BM: 9/11  Labs:   Recent Labs Lab 02/27/14 0757 02/27/14  1300 02/28/14 0534  NA 139  --  138  K 3.9  --  4.0  CL 104  --  103  CO2 21  --  22  BUN 22  --  16  CREATININE 0.95 0.86 0.81  CALCIUM 8.8  --  8.6  GLUCOSE 114*  --  98    CBG (last 3)  No results found for this basename: GLUCAP,  in the last 72 hours  Scheduled Meds: . B-complex with vitamin C  1 tablet Oral Daily  . clobetasol ointment  1 application Topical BID  . heparin  5,000 Units Subcutaneous 3 times per day  . Influenza vac split quadrivalent PF  0.5 mL Intramuscular Tomorrow-1000  . losartan  50 mg Oral QHS  . oxyCODONE-acetaminophen  2 tablet Oral Once  . pramipexole   2 mg Oral BID   And  . pramipexole  1 mg Oral Q lunch  . sodium chloride  3 mL Intravenous Q12H    Continuous Infusions:   Past Medical History  Diagnosis Date  . Obstructive sleep apnea (adult) (pediatric)   . Unspecified asthma(493.90)   . Hyperparathyroidism, unspecified   . Other vitamin B12 deficiency anemia   . Restless legs syndrome (RLS)   . Esophageal reflux 2008    EGD  . Diverticulosis of colon (without mention of hemorrhage) 2008,2006,2003    Colonoscopy  . Glaucoma   . Internal hemorrhoids without mention of complication 7408    Colonoscopy  . Candidiasis of the esophagus 07-17-2000    EGD  . Stricture and stenosis of esophagus 05-27-2002    EGD  . Colon polyps 05-27-2002    Tubulovillous Adenoma-Colonoscopy  . Hiatal hernia 2002,2003,2013    EGD  . Allergy   . Arthritis   . Blood transfusion   . Cataract     bil eyes  . Hypertension   . Pneumonia     years ago  . Multiple falls     Pt falls asleep without warning and has encounter multiple falls    Past Surgical History  Procedure Laterality Date  . Parathyroidectomy    . Total knee arthroplasty Left   . Hip surgery Right   . Colonoscopy    . Cataract extraction Bilateral   . Mass excision  10/14/2011    Procedure: EXCISION MASS;  Surgeon: Wynonia Sours, MD;  Location: [facility];  Service: Orthopedics;  Laterality: Left;   left hand  . Arm surgery    . Eye surgery Bilateral     cataracts iol    Kallie Locks, MS, Provisional LDN Pager # [phone] After hours/ weekend pager # [phone]

## 2014-03-01 ENCOUNTER — Inpatient Hospital Stay (HOSPITAL_COMMUNITY): Payer: Medicare Other

## 2014-03-01 DIAGNOSIS — E43 Unspecified severe protein-calorie malnutrition: Secondary | ICD-10-CM | POA: Diagnosis present

## 2014-03-01 DIAGNOSIS — S72109A Unspecified trochanteric fracture of unspecified femur, initial encounter for closed fracture: Principal | ICD-10-CM

## 2014-03-01 LAB — BASIC METABOLIC PANEL
Anion gap: 14 (ref 5–15)
BUN: 16 mg/dL (ref 6–23)
CO2: 21 mEq/L (ref 19–32)
CREATININE: 0.8 mg/dL (ref 0.50–1.35)
Calcium: 8.4 mg/dL (ref 8.4–10.5)
Chloride: 104 mEq/L (ref 96–112)
GFR calc Af Amer: >90 mL/min (ref >90)
GFR calc non Af Amer: 82 mL/min — ABNORMAL LOW (ref >90)
Glucose, Bld: 119 mg/dL — ABNORMAL HIGH (ref 70–99)
Potassium: 4 mEq/L (ref 3.7–5.3)
Sodium: 139 mEq/L (ref 137–147)

## 2014-03-01 LAB — CBC
HEMATOCRIT: 28.5 % — AB (ref 39.0–52.0)
Hemoglobin: 9.3 g/dL — ABNORMAL LOW (ref 13.0–17.0)
MCH: 29.2 pg (ref 26.0–34.0)
MCHC: 32.6 g/dL (ref 30.0–36.0)
MCV: 89.3 fL (ref 78.0–100.0)
Platelets: 271 10*3/uL (ref 150–400)
RBC: 3.19 MIL/uL — AB (ref 4.22–5.81)
RDW: 13.2 % (ref 11.5–15.5)
WBC: 5.8 10*3/uL (ref 4.0–10.5)

## 2014-03-01 MED ORDER — METHOCARBAMOL 1000 MG/10ML IJ SOLN
500.0000 mg | Freq: Four times a day (QID) | INTRAMUSCULAR | Status: DC | PRN
  Administered 2014-03-01 – 2014-03-02 (×2): 500 mg via INTRAVENOUS
  Filled 2014-03-01 (×3): qty 5

## 2014-03-01 MED ORDER — MORPHINE SULFATE 4 MG/ML IJ SOLN
4.0000 mg | Freq: Once | INTRAMUSCULAR | Status: AC
  Administered 2014-03-01: 4 mg via INTRAVENOUS
  Filled 2014-03-01: qty 1

## 2014-03-01 MED ORDER — SENNOSIDES-DOCUSATE SODIUM 8.6-50 MG PO TABS
2.0000 | ORAL_TABLET | Freq: Every day | ORAL | Status: DC
  Administered 2014-03-01 – 2014-03-02 (×2): 2 via ORAL
  Filled 2014-03-01 (×3): qty 2

## 2014-03-01 MED ORDER — MORPHINE SULFATE 2 MG/ML IJ SOLN
2.0000 mg | INTRAMUSCULAR | Status: DC | PRN
  Administered 2014-03-01 – 2014-03-03 (×9): 2 mg via INTRAVENOUS
  Filled 2014-03-01 (×10): qty 1

## 2014-03-01 MED ORDER — HYDROMORPHONE HCL PF 1 MG/ML IJ SOLN
1.0000 mg | Freq: Once | INTRAMUSCULAR | Status: DC
  Filled 2014-03-01: qty 1

## 2014-03-01 NOTE — Progress Notes (Signed)
Have attempted MRI twice with pain medication prior without success due to pain. Patient is not able to hold still in order to complete MRI scan as requested. Patient will likely need anesthesia in order to complete exam. Spoke with RN.

## 2014-03-01 NOTE — Progress Notes (Signed)
PT Cancellation Note  Patient Details Name: Derrick Sanchez MRN: [id number] DOB: 1931-04-25   Cancelled Treatment:    Reason Eval/Treat Not Completed: Patient at procedure or test/unavailable  Currently down at MRI per Epic; Will follow up later today as time allows;  Otherwise, will follow up for PT tomorrow;   Thank you,  Roney Marion, [place] Pager [phone] Office [phone]     Roney Marion [facility] 03/01/2014, 3:20 PM

## 2014-03-01 NOTE — Progress Notes (Signed)
Chart reviewed.                                                                                                                                                                                               Patient Demographics  Derrick Sanchez, is a 80 y.o. male, DOB - [birth year], TOI:[id number]  Admit date - 02/27/2014   Admitting Physician Cristal Ford, DO  Outpatient Primary MD for the patient is [name], MD  LOS - 2   Chief Complaint  Patient presents with  . Fall        Subjective:   Derrick Sanchez c/o severe pain  Assessment & Plan    1. Poor balance with frequent mechanical Falls - R Femur Intertrochanteric fracture: to SNF. nonop management WBAT. F/u ortho 2 weeks. Bowel regimen  2. Recent history of L4-L5 decompression surgery in August 2015. Stable. As needed pain control. Follow with neurosurgery outpatient as needed. Repeat x-ray here stable.  3. Enlarged prostate. Outpatient followup with PCP in urologist. Check PSA outpatient.  4. Enlarged right fibula noted on admission x-ray - could not tolerate MRI due to pain. Can do electively as outpatient once pain better  5. Chronic left lower extremity venous stasis wound. Negative for DVT or Baker's cyst. Wound care consulted.  6. Essential hypertension. On losartan continued.  7. Restless leg syndrome. On Mirapex  Code Status: Full  Family Communication: wife at bedside Disposition Plan: SNF  Consults  Ortho Percell Miller  Medications  Scheduled Meds: . B-complex with vitamin C  1 tablet Oral Daily  . clobetasol ointment  1 application Topical BID  . feeding supplement (ENSURE COMPLETE)  237 mL Oral BID BM  . heparin  5,000 Units Subcutaneous 3 times per day  .  HYDROmorphone (DILAUDID) injection  1 mg Intravenous Once  . losartan  50 mg Oral QHS  . oxyCODONE-acetaminophen  2 tablet Oral Once  . pramipexole  2 mg Oral BID   And  . pramipexole  1 mg Oral Q lunch  . sodium chloride  3 mL Intravenous  Q12H   Continuous Infusions:  PRN Meds:.sodium chloride, acetaminophen, acetaminophen, HYDROcodone-acetaminophen, methocarbamol (ROBAXIN) IV, morphine injection, sodium chloride  DVT Prophylaxis  Heparin  Lab Results  Component Value Date   PLT 271 03/01/2014    Antibiotics    Anti-infectives   None       Objective:   Filed Vitals:   02/28/14 2042 03/01/14 0515 03/01/14 0939 03/01/14 1318  BP: 154/68 162/75 107/49 151/68  Pulse: 83 72 69   Temp: 98.2 F (36.8 C) 98.2 F (36.8 C) 98.1 F (36.7 C)  TempSrc: Oral Oral Oral   Resp: 21 18 19    Height: 6' (1.829 m)     Weight: 70 kg (154 lb 5.2 oz)     SpO2: 99% 96% 95%     Wt Readings from Last 3 Encounters:  02/28/14 70 kg (154 lb 5.2 oz)  02/06/14 70.393 kg (155 lb 3 oz)  02/06/14 70.393 kg (155 lb 3 oz)     Intake/Output Summary (Last 24 hours) at 03/01/14 1552 Last data filed at 03/01/14 1300  Gross per 24 hour  Intake    540 ml  Output   1150 ml  Net   -610 ml     Physical Exam  Awake Alert, HOH. Uncomfortable Symmetrical Chest wall movement, Good air movement bilaterally, CTAB RRR,No Gallops,Rubs or new Murmurs, +ve B.Sounds, Abd Soft, No tenderness, No organomegaly appriciated, No rebound - guarding or rigidity. No Cyanosis, Clubbing or edema, No new Rash or bruise  Data Review   Micro Results No results found for this or any previous visit (from the past 240 hour(s)).  Radiology Reports    Dg Lumbar Spine Complete  02/27/2014   CLINICAL DATA:  Status post fall with back and right hip pain and recent back surgery  EXAM: LUMBAR SPINE - COMPLETE 4+ VIEW  COMPARISON:  Fluoro spot films from L4-5 PLIF of February 06, 2014  FINDINGS: The metallic hardware at Y4-0 is intact. The vertebral bodies are preserved in height. There is mild disc space narrowing at multiple levels. There is stable grade 1 anterolisthesis of L4 with respect L5.  IMPRESSION: There are extensive chronic and postsurgical changes of  the lumbar spine but no acute bony abnormality is demonstrated.   Electronically Signed   By: David  Martinique   On: 02/27/2014 08:23   Dg Hip Complete Right  02/27/2014   CLINICAL DATA:  Status post fall now with low back and right hip pain.  EXAM: RIGHT HIP - COMPLETE 2+ VIEW  COMPARISON:  Right femur series of today's date.  FINDINGS: The prosthetic right hip joint appears to be appropriately positioned. The interface with the native bone is unremarkable. No acute fracture of the pelvis is demonstrated. There is lucency through the base of the neck of the left femur. This could reflect overlap of soft tissue structures but a fracture is not excluded here.  IMPRESSION: 1. There is no acute bony abnormality of the you right hip and the prosthesis is intact. 2. I cannot exclude a fracture involving the base of the neck of the left femur. A left hip series is recommended unless the patient is absolutely asymptomatic.   Electronically Signed   By: David  Martinique   On: 02/27/2014 08:26   Dg Femur Right  02/27/2014   CLINICAL DATA:  Status post fall with right hip pain  EXAM: RIGHT FEMUR - 2 VIEW  COMPARISON:  Right hip series of today's date.  FINDINGS: The prosthetic right hip joint is appropriately positioned. The interface with the native bone is normal. The femoral shaft is intact. There are degenerative changes of the right knee. There is expansile remodeling of and sclerosis of the fibular head.  IMPRESSION: 1. There is no acute abnormality of the right femur. 2. There is mild to moderate degenerative change of the right knee joint. 3. Expansile remodeling of the fibular head may reflect an entity such as fibrous dysplasia but malignancy is not excluded. Further evaluation with a dedicated knee or fibular series is recommended.   Electronically  Signed   By: David  Martinique   On: 02/27/2014 08:28      Ct Lumbar Spine Wo Contrast  02/27/2014   CLINICAL DATA:  Severe back pain after fall.  EXAM: CT LUMBAR SPINE  WITHOUT CONTRAST  TECHNIQUE: Multidetector CT imaging of the lumbar spine was performed without intravenous contrast administration. Multiplanar CT image reconstructions were also generated.  COMPARISON:  Same day.  FINDINGS: Status post surgical posterior fusion of L4 and L5 with bilateral intrapedicular screw placement. Grade 1 anterolisthesis of L4-5 is noted. Severe degenerative disc disease is also noted at L4-5 and L5-S1 mild degenerative disc disease is noted at L1-2 and L2-3 with anterior osteophyte formation. Atherosclerotic calcifications of abdominal aorta are noted without aneurysm formation. No acute fracture is noted.  IMPRESSION: Postsurgical and degenerative changes are noted as described above. No acute abnormality seen in the lumbar spine.   Electronically Signed   By: Sabino Dick M.D.   On: 02/27/2014 11:26   Ct Pelvis Wo Contrast  02/27/2014   CLINICAL DATA:  Pain post trauma  EXAM: CT PELVIS WITHOUT CONTRAST  TECHNIQUE: Multidetector CT imaging of the pelvis was performed following the standard protocol without intravenous contrast.  COMPARISON:  CT left hip November 22, 2013  FINDINGS: There is currently a comminuted fracture of the left greater trochanter with the left greater trochanter appearing somewhat fragmented. The greater trochanter is displaced superiorly, posteriorly, and medially compared to the remainder the femur with approximately 3 cm of displacement, increased from prior study. There is a total hip prosthesis on the right which appears well seated. No other fracture is currently appreciable. No dislocation. There is mild narrowing of the left hip joint.  In the visualized lumbar spine, there is postoperative change at L4 and L5. There is marked arthropathy at L4-5 at L5-S1. There is 9 mm of anterolisthesis of L4 on L5.  In the pelvis, there are multiple sigmoid diverticula without diverticulitis. There is no pelvic mass or fluid. There is atherosclerotic change noted in the  aorta and iliac arteries. Prostate appears rather prominent with several calcifications within the prostate.  IMPRESSION: Comminuted fracture of the greater trochanter of the left femur with increased displacement and fragmentation of the greater trochanter compared to prior study.  Marked arthropathy in the visualized lumbar spine. Postoperative changes noted in this area. There is spondylolisthesis at L4-5.  Total hip replacement on the right.  Prosthesis appears well-seated.  Sigmoid diverticulosis without diverticulitis. Prostate prominent. This finding may warrant correlation with PSA.   Electronically Signed   By: Lowella Grip M.D.   On: 02/27/2014 11:28        CBC  Recent Labs Lab 02/27/14 0757 02/27/14 1300 02/28/14 0534 03/01/14 0521  WBC 6.6 5.4 5.4 5.8  HGB 10.0* 9.6* 10.0* 9.3*  HCT 30.4* 29.5* 31.2* 28.5*  PLT 307 291 323 271  MCV 90.2 90.8 91.0 89.3  MCH 29.7 29.5 29.2 29.2  MCHC 32.9 32.5 32.1 32.6  RDW 13.4 13.4 13.5 13.2    Chemistries   Recent Labs Lab 02/27/14 0757 02/27/14 1300 02/28/14 0534 03/01/14 0521  NA 139  --  138 139  K 3.9  --  4.0 4.0  CL 104  --  103 104  CO2 21  --  22 21  GLUCOSE 114*  --  98 119*  BUN 22  --  16 16  CREATININE 0.95 0.86 0.81 0.80  CALCIUM 8.8  --  8.6 8.4   ------------------------------------------------------------------------------------------------------------------ estimated creatinine  clearance is 71.7 ml/min (by C-G formula based on Cr of 0.8). ------------------------------------------------------------------------------------------------------------------ No results found for this basename: HGBA1C,  in the last 72 hours ------------------------------------------------------------------------------------------------------------------ No results found for this basename: CHOL, HDL, LDLCALC, TRIG, CHOLHDL, LDLDIRECT,  in the last 72  hours ------------------------------------------------------------------------------------------------------------------  Recent Labs  02/27/14 1300  TSH 2.720   ------------------------------------------------------------------------------------------------------------------  Recent Labs  02/27/14 1300  VITAMINB12 400  FOLATE >20.0    Coagulation profile  Recent Labs Lab 02/28/14 0800  INR 1.24    No results found for this basename: DDIMER,  in the last 72 hours  Cardiac Enzymes No results found for this basename: CK, CKMB, TROPONINI, MYOGLOBIN,  in the last 168 hours ------------------------------------------------------------------------------------------------------------------ No components found with this basename: POCBNP,    Time Spent in minutes   25  Delfina Redwood M.D on 03/01/2014 at 3:52 PM [facility] [phone]

## 2014-03-01 NOTE — Progress Notes (Signed)
Note/chart reviewed.  Katie Nichole Neyer, RD, LDN Pager #: [phone] After-Hours Pager #: [phone]  

## 2014-03-01 NOTE — Clinical Social Work Psychosocial (Signed)
Clinical Social Work Department BRIEF PSYCHOSOCIAL ASSESSMENT 03/01/2014  Patient:  Derrick Sanchez, Derrick Sanchez     Account Number:  [id number]     Admit date:  02/27/2014  Clinical Social Worker:  Frederico Hamman  Date/Time:  03/01/2014 06:05 AM  Referred by:  Physician  Date Referred:  02/28/2014 Referred for  SNF Placement   Other Referral:   Interview type:  Patient Other interview type:   CSW also spoke with wife who was at the bedside.    PSYCHOSOCIAL DATA Living Status:  WIFE Admitted from facility:   Level of care:   Primary support name:  Derrick Sanchez Primary support relationship to patient:  SPOUSE Degree of support available:   Strong support    CURRENT CONCERNS Current Concerns  Post-Acute Placement   Other Concerns:    SOCIAL WORK ASSESSMENT / PLAN On 02/28/14, CSW visited room and spoke with patient and wife about discharge planning and recommendation of short-term rehab.  Patient is alert and oriented and was able to participate in the conversation. Facility preference is [facility]. CSW explained process and informed patient and wife that they will be kept updated regarding facility search and responses.   Assessment/plan status:  Psychosocial Support/Ongoing Assessment of Needs Other assessment/ plan:   Information/referral to community resources:   Psychologist, occupational for [facility]    PATIENT'S/FAMILY'S RESPONSE TO PLAN OF CARE: Patient and wife receptive to talking with CSW about ST rehab and hope for [facility] for rehab. CSW was also provided with 2 other options if [place] accept patient.

## 2014-03-02 MED ORDER — HYDROMORPHONE HCL 1 MG/ML IJ SOLN
1.0000 mg | Freq: Once | INTRAMUSCULAR | Status: AC
  Administered 2014-03-02: 1 mg via INTRAVENOUS
  Filled 2014-03-02: qty 1

## 2014-03-02 MED ORDER — FLEET ENEMA 7-19 GM/118ML RE ENEM: 1.0000 | ENEMA | Freq: Every day | RECTAL | Status: DC | PRN

## 2014-03-02 MED ORDER — ENOXAPARIN SODIUM 40 MG/0.4ML ~~LOC~~ SOLN: 40.0000 mg | SUBCUTANEOUS | Status: DC

## 2014-03-02 MED ORDER — METHOCARBAMOL 500 MG PO TABS: 500.0000 mg | ORAL_TABLET | Freq: Four times a day (QID) | ORAL | Status: DC | PRN

## 2014-03-02 MED ORDER — OXYCODONE-ACETAMINOPHEN 5-325 MG PO TABS
2.0000 | ORAL_TABLET | Freq: Four times a day (QID) | ORAL | Status: DC | PRN
  Administered 2014-03-02 – 2014-03-03 (×2): 2 via ORAL
  Filled 2014-03-02 (×2): qty 2

## 2014-03-02 MED ORDER — HYDROCODONE-ACETAMINOPHEN 5-325 MG PO TABS: 1.0000 | ORAL_TABLET | ORAL | Status: DC | PRN

## 2014-03-02 MED ORDER — SENNOSIDES-DOCUSATE SODIUM 8.6-50 MG PO TABS: 2.0000 | ORAL_TABLET | Freq: Every day | ORAL | Status: DC

## 2014-03-02 MED ORDER — ACETAMINOPHEN 325 MG PO TABS: 650.0000 mg | ORAL_TABLET | Freq: Four times a day (QID) | ORAL | Status: DC | PRN

## 2014-03-02 MED ORDER — ENSURE COMPLETE PO LIQD: 237.0000 mL | Freq: Two times a day (BID) | ORAL | Status: DC

## 2014-03-02 MED ORDER — METHOCARBAMOL 500 MG PO TABS
500.0000 mg | ORAL_TABLET | Freq: Four times a day (QID) | ORAL | Status: DC | PRN
Start: 2014-03-02 — End: 2014-03-02

## 2014-03-02 MED ORDER — BISACODYL 10 MG RE SUPP: 10.0000 mg | RECTAL | Status: DC | PRN

## 2014-03-02 NOTE — Discharge Summary (Signed)
Physician Discharge Summary  Derrick Sanchez XBM:[id number] DOB: 13-Jan-1931 DOA: 02/27/2014  PCP: [name], MD  Admit date: 02/27/2014 Discharge date: 03/02/2014  Time spent: greater than 30 minutes  Recommendations for Outpatient Follow-up:  Needs non-urgent MRI right fibular head with and without contrast once pain improves.  Abnormality of fibular head seen on xray. Attempted during this hospitalization, unable to tolerate due to pain To [facility] SNF  Discharge Diagnoses:  Principal Problem:   Left greater Trochanteric fracture Active Problems:   Low back pain   Frequent falls   Protein-calorie malnutrition, severe Abnormal xray right fibular head, needs non-urgent MRI with and without contrast once pain improves Prostatomegaly on CT pelvis. Consider PSA as outpatient. Defer to PCP Chronic venous stasis HTN RLS   Discharge Condition: stable  Filed Weights   02/27/14 1328 02/27/14 2049 02/28/14 2042  Weight: 69.2 kg (152 lb 8.9 oz) 69.5 kg (153 lb 3.5 oz) 70 kg (154 lb 5.2 oz)    History of present illness:  80 y.o. male  With a history of recent surgery- decompression of L4-L5 in August of 2015, hypertension, restless leg syndrome, that presented to the emergency department with his wife after falling. Patient has been falling more frequently. He does use a walker for ambulation. Patient stated he does not have dizziness or, he trips. Patient denied any headache, dizziness, loss of consciousness. Denies any recent travel or ill contacts. Patient did not go to rehabilitation after his back surgery. At the time of admission, patient complained of back pain as well as burning in his legs which has been chronic. In the emergency department, patient received several x-rays of the femur, hip and lumbar spine, no fractures noted, but CT pelvis showed greater trochanter fracture.  Hospital Course:  Admitted to hospitalists. Orthopedics consulted and recommended nonoperative  management, WBAT, and follow up with Dr. Kathryne Hitch in 2 weeks.  Also, incidental finding of "Expansile remodeling of the right fibular head may reflect an entity such as fibrous dysplasia but malignancy is not excluded" on xray. MRI with and without contrast of right fibular head attempted, but patient unable to tolerate due to pain. Recommend repeat attempt after hip fracture heals.  Has worked with PT, OT, and recommend ST SNF.  Recommend dvt prophylaxis with lovenox for 21 days.   Procedures:  none  Consultations:  Orthopedics, Kathryne Hitch  Discharge Exam: General: comfortable. Eating lunch Cardiovascular: RRR Respiratory: CTA Ext S, NT, ND   Discharge Instructions   Diet general    Complete by:  As directed      Walk with assistance    Complete by:  As directed   WBAT          Current Discharge Medication List    START taking these medications   Details  acetaminophen (TYLENOL) 325 MG tablet Take 2 tablets (650 mg total) by mouth every 6 (six) hours as needed for mild pain (or Fever >/= 101).    bisacodyl (DULCOLAX) 10 MG suppository Place 1 suppository (10 mg total) rectally as needed for moderate constipation. Qty: 12 suppository, Refills: 0    enoxaparin (LOVENOX) 40 MG/0.4ML injection Inject 0.4 mLs (40 mg total) into the skin daily. For 21 days Qty: 0 Syringe    feeding supplement, ENSURE COMPLETE, (ENSURE COMPLETE) LIQD Take 237 mLs by mouth 2 (two) times daily between meals.    methocarbamol (ROBAXIN) 500 MG tablet Take 1 tablet (500 mg total) by mouth every 6 (six) hours as needed for  muscle spasms. Qty: 20 tablet, Refills: 0    senna-docusate (SENOKOT-S) 8.6-50 MG per tablet Take 2 tablets by mouth at bedtime.    sodium phosphate (FLEET) 7-19 GM/118ML ENEM Place 133 mLs (1 enema total) rectally daily as needed for severe constipation. Refills: 0      CONTINUE these medications which have CHANGED   Details  HYDROcodone-acetaminophen (NORCO/VICODIN)  5-325 MG per tablet Take 1-2 tablets by mouth every 4 (four) hours as needed for moderate pain or severe pain (mild pain). Qty: 30 tablet, Refills: 0      CONTINUE these medications which have NOT CHANGED   Details  b complex vitamins tablet Take 1 tablet by mouth every morning.     losartan (COZAAR) 50 MG tablet Take 50 mg by mouth at bedtime.     pramipexole (MIRAPEX) 1 MG tablet Take 1-2 mg by mouth 3 (three) times daily. Take 2 mg by mouth in the morning, take 1 mg by mouth at lunch time, and take 2 mg by mouth in the evening.      STOP taking these medications     clobetasol ointment (TEMOVATE) 0.05 %        No Known Allergies Follow-up Information   Schedule an appointment as soon as possible for a visit with Hosie Spangle, MD.   Specialty:  Neurosurgery   Contact information:   [address] [place] [address] [phone]       Follow up with Ninetta Lights, MD. Schedule an appointment as soon as possible for a visit in 2 weeks.   Specialty:  Orthopedic Surgery   Contact information:   [address] [place] [address] [phone]        The results of significant diagnostics from this hospitalization (including imaging, microbiology, ancillary and laboratory) are listed below for reference.    Significant Diagnostic Studies: Dg Chest 2 View  02/01/2014   CLINICAL DATA:  Preoperative of lumbar decompression; hypertension  EXAM: CHEST  2 VIEW  COMPARISON:  November 22, 2013  FINDINGS: There is underlying emphysematous change.  There is a 1.2 x 1.1 cm nodular opacity in the right upper lobe slightly lateral to the superior right hilum.  There is mild scarring in the left base and right upper lobe regions. There is a small granuloma in the left base. There is no lung edema or consolidation. The heart size is within normal limits. The pulmonary vascularity reflects underlying emphysema. No  adenopathy. There is atherosclerotic change in the aorta. No bone lesions.  IMPRESSION: There is a 1.2 x 1.1 cm nodular opacity in the right upper lobe slightly lateral to the superior right hilum. Advise noncontrast enhanced chest CT to further assess.  Underlying emphysematous change with scattered areas of scarring. No edema or consolidation.  These results will be called to the ordering clinician or representative by the Radiologist Assistant, and communication documented in the PACS or zVision Dashboard.   Electronically Signed   By: Lowella Grip M.D.   On: 02/01/2014 12:53   Dg Lumbar Spine 2-3 Views  02/06/2014   CLINICAL DATA:  80 year old male undergoing lumbar spine surgery. Initial encounter.  EXAM: DG C-ARM 61-120 MIN; LUMBAR SPINE - 2-3 VIEW  TECHNIQUE: Two intraoperative fluoroscopic views of the lower lumbar spine.  CONTRAST:  None  FLUOROSCOPY TIME:  0 min 33 seconds.  COMPARISON:  Intraoperative radiographs 1236 hr the same day and earlier.  FINDINGS: Same  numbering system as on the comparison. Posterior and interbody fusion hardware now in place at the L4-L5 level. Bilateral transpedicular hardware with connecting rods. Stable vertebral height and alignment, with grade 1 anterolisthesis.  IMPRESSION: Posterior and interbody fusion at L4-L5.   Electronically Signed   By: Lars Pinks M.D.   On: 02/06/2014 17:08   Dg Lumbar Spine 2-3 Views  02/06/2014   CLINICAL DATA:  L4-5 PLIF.  EXAM: LUMBAR SPINE - 2-3 VIEW  COMPARISON:  MR lumbar spine 08/25/2013.  FINDINGS: 2 intraoperative cross-table lateral views of the lumbar spine are submitted. Numbering system utilized on 08/25/2013 is preserved.  The first image, taken at 1235 hr shows a surgical instrument tip projecting over the L3 spinous process.  The second image, taken at 1245 hr, shows a surgical instrument tip projecting over the L4 spinous process.  Grade 1 anterolisthesis of L4 on L5, as on 08/25/2013. Degenerative disc disease and loss  of disc space height at L4-5 and L5-S1 with facet hypertrophy.  IMPRESSION: Intraoperative localization at the L3 and L4 levels.   Electronically Signed   By: Lorin Picket M.D.   On: 02/06/2014 15:44   Dg Lumbar Spine Complete  02/27/2014   CLINICAL DATA:  Status post fall with back and right hip pain and recent back surgery  EXAM: LUMBAR SPINE - COMPLETE 4+ VIEW  COMPARISON:  Fluoro spot films from L4-5 PLIF of February 06, 2014  FINDINGS: The metallic hardware at O1-3 is intact. The vertebral bodies are preserved in height. There is mild disc space narrowing at multiple levels. There is stable grade 1 anterolisthesis of L4 with respect L5.  IMPRESSION: There are extensive chronic and postsurgical changes of the lumbar spine but no acute bony abnormality is demonstrated.   Electronically Signed   By: David  Martinique   On: 02/27/2014 08:23   Dg Hip Complete Right  02/27/2014   CLINICAL DATA:  Status post fall now with low back and right hip pain.  EXAM: RIGHT HIP - COMPLETE 2+ VIEW  COMPARISON:  Right femur series of today's date.  FINDINGS: The prosthetic right hip joint appears to be appropriately positioned. The interface with the native bone is unremarkable. No acute fracture of the pelvis is demonstrated. There is lucency through the base of the neck of the left femur. This could reflect overlap of soft tissue structures but a fracture is not excluded here.  IMPRESSION: 1. There is no acute bony abnormality of the you right hip and the prosthesis is intact. 2. I cannot exclude a fracture involving the base of the neck of the left femur. A left hip series is recommended unless the patient is absolutely asymptomatic.   Electronically Signed   By: David  Martinique   On: 02/27/2014 08:26   Dg Femur Right  02/27/2014   CLINICAL DATA:  Status post fall with right hip pain  EXAM: RIGHT FEMUR - 2 VIEW  COMPARISON:  Right hip series of today's date.  FINDINGS: The prosthetic right hip joint is appropriately  positioned. The interface with the native bone is normal. The femoral shaft is intact. There are degenerative changes of the right knee. There is expansile remodeling of and sclerosis of the fibular head.  IMPRESSION: 1. There is no acute abnormality of the right femur. 2. There is mild to moderate degenerative change of the right knee joint. 3. Expansile remodeling of the fibular head may reflect an entity such as fibrous dysplasia but malignancy is not excluded. Further  evaluation with a dedicated knee or fibular series is recommended.   Electronically Signed   By: David  Martinique   On: 02/27/2014 08:28   Ct Chest Wo Contrast  02/02/2014   CLINICAL DATA:  Pulmonary nodule by chest x-ray  EXAM: CT CHEST WITHOUT CONTRAST  TECHNIQUE: Multidetector CT imaging of the chest was performed following the standard protocol without IV contrast.  COMPARISON:  02/01/2014 chest x-ray, 10/27/2006 CT  FINDINGS: Atherosclerosis of the major branch vessels and thoracic aorta. Coronary calcifications also evident. Normal heart size. No pericardial or pleural effusion. No adenopathy.  Included upper abdomen demonstrates numerous varying size renal cysts, largest in the upper pole measures 9 cm. Splenic granulomata evident. Abdominal atherosclerosis noted.  Lung windows demonstrate similar pattern upper lobe subpleural fibrosis and scarring peripherally. This has slightly progressed compared to 10/27/2006. Left upper lobe superior segment subpleural calcified granulomas evident, unchanged on image 17. Additional calcified granulomas in the left lower lobe along the major fissure, image 49. Minor basilar atelectasis/ scarring. No suspicious pulmonary nodule or mass. Chest x-ray finding appears to correlate with a healed sclerotic irregular anterior right third rib fracture, image 25. Trachea and central airways are patent.  Diffuse degenerative changes of the spine.  IMPRESSION: No suspicious pulmonary nodule or mass. Chest x-ray  finding appears to correlate with sclerotic irregular old right rib fracture.  Hyperinflation  Chronic subpleural parenchymal scarring  Remote granulomatous disease   Electronically Signed   By: Daryll Brod M.D.   On: 02/02/2014 13:22   Ct Lumbar Spine Wo Contrast  02/27/2014   CLINICAL DATA:  Severe back pain after fall.  EXAM: CT LUMBAR SPINE WITHOUT CONTRAST  TECHNIQUE: Multidetector CT imaging of the lumbar spine was performed without intravenous contrast administration. Multiplanar CT image reconstructions were also generated.  COMPARISON:  Same day.  FINDINGS: Status post surgical posterior fusion of L4 and L5 with bilateral intrapedicular screw placement. Grade 1 anterolisthesis of L4-5 is noted. Severe degenerative disc disease is also noted at L4-5 and L5-S1 mild degenerative disc disease is noted at L1-2 and L2-3 with anterior osteophyte formation. Atherosclerotic calcifications of abdominal aorta are noted without aneurysm formation. No acute fracture is noted.  IMPRESSION: Postsurgical and degenerative changes are noted as described above. No acute abnormality seen in the lumbar spine.   Electronically Signed   By: Sabino Dick M.D.   On: 02/27/2014 11:26   Ct Pelvis Wo Contrast  02/27/2014   CLINICAL DATA:  Pain post trauma  EXAM: CT PELVIS WITHOUT CONTRAST  TECHNIQUE: Multidetector CT imaging of the pelvis was performed following the standard protocol without intravenous contrast.  COMPARISON:  CT left hip November 22, 2013  FINDINGS: There is currently a comminuted fracture of the left greater trochanter with the left greater trochanter appearing somewhat fragmented. The greater trochanter is displaced superiorly, posteriorly, and medially compared to the remainder the femur with approximately 3 cm of displacement, increased from prior study. There is a total hip prosthesis on the right which appears well seated. No other fracture is currently appreciable. No dislocation. There is mild narrowing of  the left hip joint.  In the visualized lumbar spine, there is postoperative change at L4 and L5. There is marked arthropathy at L4-5 at L5-S1. There is 9 mm of anterolisthesis of L4 on L5.  In the pelvis, there are multiple sigmoid diverticula without diverticulitis. There is no pelvic mass or fluid. There is atherosclerotic change noted in the aorta and iliac arteries. Prostate appears rather  prominent with several calcifications within the prostate.  IMPRESSION: Comminuted fracture of the greater trochanter of the left femur with increased displacement and fragmentation of the greater trochanter compared to prior study.  Marked arthropathy in the visualized lumbar spine. Postoperative changes noted in this area. There is spondylolisthesis at L4-5.  Total hip replacement on the right.  Prosthesis appears well-seated.  Sigmoid diverticulosis without diverticulitis. Prostate prominent. This finding may warrant correlation with PSA.   Electronically Signed   By: Lowella Grip M.D.   On: 02/27/2014 11:28   Dg C-arm 1-60 Min  02/06/2014   CLINICAL DATA:  80 year old male undergoing lumbar spine surgery. Initial encounter.  EXAM: DG C-ARM 61-120 MIN; LUMBAR SPINE - 2-3 VIEW  TECHNIQUE: Two intraoperative fluoroscopic views of the lower lumbar spine.  CONTRAST:  None  FLUOROSCOPY TIME:  0 min 33 seconds.  COMPARISON:  Intraoperative radiographs 1236 hr the same day and earlier.  FINDINGS: Same numbering system as on the comparison. Posterior and interbody fusion hardware now in place at the L4-L5 level. Bilateral transpedicular hardware with connecting rods. Stable vertebral height and alignment, with grade 1 anterolisthesis.  IMPRESSION: Posterior and interbody fusion at L4-L5.   Electronically Signed   By: Lars Pinks M.D.   On: 02/06/2014 17:08    Microbiology: No results found for this or any previous visit (from the past 240 hour(s)).   Labs: Basic Metabolic Panel:  Recent Labs Lab 02/27/14 0757  02/27/14 1300 02/28/14 0534 03/01/14 0521  NA 139  --  138 139  K 3.9  --  4.0 4.0  CL 104  --  103 104  CO2 21  --  22 21  GLUCOSE 114*  --  98 119*  BUN 22  --  16 16  CREATININE 0.95 0.86 0.81 0.80  CALCIUM 8.8  --  8.6 8.4   Liver Function Tests: No results found for this basename: AST, ALT, ALKPHOS, BILITOT, PROT, ALBUMIN,  in the last 168 hours No results found for this basename: LIPASE, AMYLASE,  in the last 168 hours No results found for this basename: AMMONIA,  in the last 168 hours CBC:  Recent Labs Lab 02/27/14 0757 02/27/14 1300 02/28/14 0534 03/01/14 0521  WBC 6.6 5.4 5.4 5.8  HGB 10.0* 9.6* 10.0* 9.3*  HCT 30.4* 29.5* 31.2* 28.5*  MCV 90.2 90.8 91.0 89.3  PLT 307 291 323 271   Cardiac Enzymes: No results found for this basename: CKTOTAL, CKMB, CKMBINDEX, TROPONINI,  in the last 168 hours BNP: BNP (last 3 results) No results found for this basename: PROBNP,  in the last 8760 hours CBG: No results found for this basename: GLUCAP,  in the last 168 hours     Signed:  Michol Emory L  [facility] 03/02/2014, 10:16 AM

## 2014-03-02 NOTE — Plan of Care (Signed)
Problem: Phase III Progression Outcomes Goal: Discharge plan remains appropriate-arrangements made Discharge to [facility].

## 2014-03-02 NOTE — Plan of Care (Signed)
Problem: Phase III Progression Outcomes Goal: Pain controlled on oral analgesia Outcome: Not Progressing Patient still moaning in 10 out of 10 pain uncontrolled by morphine IV and vicodin.

## 2014-03-02 NOTE — Clinical Social Work Note (Signed)
Patient medically stable for discharge to [facility] today. Clinicals submitted to [facility] on 9/16. [facility] admissions staff person Tammy attempted to reach [place] without success today. Patient will discharge to [facility] on 03/03/14 after [facility] authorization received.  Derrick Sanchez, MSW, LCSW [phone]

## 2014-03-02 NOTE — Progress Notes (Signed)
Physical Therapy Treatment Patient Details Name: Derrick Sanchez MRN: [id number] DOB: 07-02-1930 Today's Date: 03/02/2014    History of Present Illness 80 y.o. male with h/o recent L4-L5 decompression on 02/06/14 admitted with multiple falls. Imaging showed L greater trochanter comminuted fx.    PT Comments    Patient progressing well with mobility. Improvement in bil hip pain today allowing pt to increase ambulation distance. Continues to require Min-Mod A for transfers- more assist needed to stand from lower surfaces. Fatigues easily resulting in impaired gait mechanics. Pt motivated and cooperative. Will continue to follow and progress as tolerated.   Follow Up Recommendations  SNF;Supervision for mobility/OOB     Equipment Recommendations  None recommended by PT    Recommendations for Other Services       Precautions / Restrictions Precautions Precautions: None Precaution Comments: Pt reports 4 falls in past 3 weeks (since back surgery), stated he trips on things, denies dizziness. Stated he was having falls prior to back surgery too.  Restrictions Weight Bearing Restrictions: Yes LLE Weight Bearing: Weight bearing as tolerated    Mobility  Bed Mobility Overal bed mobility: Needs Assistance Bed Mobility: Rolling;Sidelying to Sit Rolling: Min assist Sidelying to sit: Mod assist       General bed mobility comments: Requires  assist mobilizing BLEs to EOB and to elevate trunk. VC for sequencing and technique. Reminders to maintain back precautions.  Transfers Overall transfer level: Needs assistance Equipment used: Rolling walker (2 wheeled) Transfers: Sit to/from Stand Sit to Stand: Min assist;Mod assist         General transfer comment: Required min A to stand from EOBx2 with VC for hand placement and technique. Mod A to stand from low toilet with assist of grab bar.   Ambulation/Gait Ambulation/Gait assistance: Min guard Ambulation Distance (Feet): 80  Feet Assistive device: Rolling walker (2 wheeled) Gait Pattern/deviations: Step-through pattern;Step-to pattern;Decreased stride length;Antalgic Gait velocity: decreased   General Gait Details: Step through gait pattern initially progressing to step to gait pattern when fatigued. Increased knee flexion in Bil knees throughout gait cycle - worsened towards end.    Stairs            Wheelchair Mobility    Modified Rankin (Stroke Patients Only)       Balance Overall balance assessment: Needs assistance   Sitting balance-Leahy Scale: Fair Sitting balance - Comments: Able to sit EOB unsupported without UE support. Reach outside BoS minimally without LOB.   Standing balance support: During functional activity Standing balance-Leahy Scale: Poor Standing balance comment: Requires BUE support during static and dynamic standing- able to wash hands at sink leaning on counter for support, total A for pericare with UE support on RW. Able to perform static standing ~2 minutes.                     Cognition Arousal/Alertness: Awake/alert Behavior During Therapy: WFL for tasks assessed/performed Overall Cognitive Status: Within Functional Limits for tasks assessed (Able to state wife's bday and day of the week.)                      Exercises      General Comments        Pertinent Vitals/Pain Pain Assessment: No/denies pain    Home Living Family/patient expects to be discharged to:: Skilled nursing facility                    Prior Function  PT Goals (current goals can now be found in the care plan section) Progress towards PT goals: Progressing toward goals    Frequency       PT Plan Current plan remains appropriate    Co-evaluation             End of Session Equipment Utilized During Treatment: Gait belt Activity Tolerance: Patient limited by fatigue Patient left: in chair;with call bell/phone within reach;with  family/visitor present     Time: 1100-1127 PT Time Calculation (min): 27 min  Charges:  $Gait Training: 8-22 mins $Therapeutic Activity: 8-22 mins                    G CodesCandy Sledge A 21-Mar-2014, 11:35 AM Candy Sledge, [place], DPT [phone]

## 2014-03-03 ENCOUNTER — Inpatient Hospital Stay
Admission: RE | Admit: 2014-03-03 | Discharge: 2014-03-12 | Disposition: A | Discharge status: Nursing Home (Includes ICF) | Payer: Medicare Other | Source: Ambulatory Visit | Attending: Internal Medicine | Admitting: Internal Medicine

## 2014-03-03 NOTE — Clinical Social Work Placement (Addendum)
Clinical Social Work Department CLINICAL SOCIAL WORK PLACEMENT NOTE 03/03/2014  Patient:  RONNE, STEFANSKI  Account Number:  [id number] Admit date:  02/27/2014  Clinical Social Worker:  Elcie Pelster Givens, LCSW  Date/time:  03/03/2014 03:04 AM  Clinical Social Work is seeking post-discharge placement for this patient at the following level of care:   SKILLED NURSING   (*CSW will update this form in Epic as items are completed)   02/28/2014  Patient/family provided with [place] Department of Clinical Social Work's list of facilities offering this level of care within the geographic area requested by the patient (or if unable, by the patient's family).  02/28/2014  Patient/family informed of their freedom to choose among providers that offer the needed level of care, that participate in Medicare, Medicaid or managed care program needed by the patient, have an available bed and are willing to accept the patient.    Patient/family informed of [facility]' ownership interest in [facility], as well as of the fact that they are under no obligation to receive care at this facility.  PASARR submitted to EDS on 03/02/2014 PASARR number received on 03/02/2014  FL2 transmitted to all facilities in geographic area requested by pt/family on  02/28/2014 FL2 transmitted to all facilities within larger geographic area on   Patient informed that his/her managed care company has contracts with or will negotiate with  certain facilities, including the following: Rec'd Blue Medicare authorization on 9/18 - [address] for 4 days - [place]: [facility]. Insurance representative Suanne Marker [phone], [phone]) will take care of ambulance authorization.  Patient/family informed of bed offers received:  03/02/2014 Patient chooses bed at [facility] Physician recommends and patient chooses bed at    Patient to be transferred to [facility] on  03/03/2014 Patient to be transferred to facility by   Patient and family notified of transfer on 03/03/2014 Name of family member notified:  Wife, Drae Mitzel at the bedside  The following physician request were entered in Epic:   Additional Comments:

## 2014-03-03 NOTE — Progress Notes (Signed)
Physical Therapy Treatment Patient Details Name: Derrick Sanchez MRN: [id number] DOB: 1931/06/15 Today's Date: 03/03/2014    History of Present Illness 80 y.o. male with h/o recent L4-L5 decompression on 02/06/14 admitted with multiple falls. Imaging showed L greater trochanter comminuted fx.    PT Comments    Patient progressing well with mobility. Pain well controlled today as pt given pain medication prior to PT treatment. Required some assist donning Aspen back brace. Improved ambulation distance today. Continues to require assist with standing especially from lower surfaces. Reviewed back precautions and importance of wearing brace for al mobility. Will continue to follow and progress as tolerated.   Follow Up Recommendations  SNF;Supervision for mobility/OOB     Equipment Recommendations  None recommended by PT    Recommendations for Other Services       Precautions / Restrictions Precautions Precautions: Back;Fall Precaution Booklet Issued: No Precaution Comments: Pt has Aspen back brace. Restrictions Weight Bearing Restrictions: Yes LLE Weight Bearing: Weight bearing as tolerated    Mobility  Bed Mobility               General bed mobility comments: Sitting EOB upon PT arrival.  Transfers Overall transfer level: Needs assistance Equipment used: Rolling walker (2 wheeled) Transfers: Sit to/from Stand Sit to Stand: Min assist;Mod assist         General transfer comment: Required min A to stand from EOBx2 with VC for hand placement and technique. Mod A to stand from low toilet with assist of grab bar. Transferred to chair post treatment. Cues to reach back prior to descent into chair.  Ambulation/Gait Ambulation/Gait assistance: Min guard Ambulation Distance (Feet): 100 Feet Assistive device: Rolling walker (2 wheeled) Gait Pattern/deviations: Step-through pattern;Decreased stride length;Antalgic Gait velocity: decreased   General Gait Details:  Antalgic gait pattern noted today with increased knee flexion bil throughout gait cycle. SOB present. Vitals stable. Slow, steady gait.   Stairs            Wheelchair Mobility    Modified Rankin (Stroke Patients Only)       Balance Overall balance assessment: Needs assistance   Sitting balance-Leahy Scale: Fair Sitting balance - Comments: Able to sit EOB unsupported without UE support. Reach outside BoS minimally without LOB. Able to donn aspen back brace with Min A sitting EOB prior to treatment.     Standing balance-Leahy Scale: Poor Standing balance comment: Requires UE support during static and dynamic standing due to balance deficits.                     Cognition Arousal/Alertness: Awake/alert Behavior During Therapy: WFL for tasks assessed/performed Overall Cognitive Status: Within Functional Limits for tasks assessed                      Exercises      General Comments        Pertinent Vitals/Pain Pain Assessment: No/denies pain (Just given pain medication prior to PT arrival. )    Home Living                      Prior Function            PT Goals (current goals can now be found in the care plan section) Progress towards PT goals: Progressing toward goals    Frequency  Min 4X/week    PT Plan Current plan remains appropriate    Co-evaluation  End of Session Equipment Utilized During Treatment: Gait belt Activity Tolerance: Patient tolerated treatment well Patient left: in chair;with call bell/phone within reach;with family/visitor present     Time: 0786-7544 PT Time Calculation (min): 25 min  Charges:  $Gait Training: 8-22 mins $Therapeutic Activity: 8-22 mins                    G CodesCandy Sledge A March 04, 2014, 10:08 AM Candy Sledge, [place], DPT [phone]

## 2014-03-03 NOTE — Progress Notes (Signed)
CARE MANAGEMENT NOTE 03/03/2014  Patient:  Derrick Sanchez, Derrick Sanchez   Account Number:  [id number]  Date Initiated:  02/28/2014  Documentation initiated by:  Lizabeth Leyden  Subjective/Objective Assessment:   admitted from home c/o lower back pain, s/p fall.  Lives with spouse.  CT: fx left greater trochanter     Action/Plan:   progression of care and discharge planning   Anticipated DC Date:  03/01/2014   Anticipated DC Plan:  SKILLED NURSING FACILITY  In-house referral  Clinical Social Worker         Choice offered to / List presented to:             Status of service:  Completed, signed off Medicare Important Message given?  YES (If response is "NO", the following Medicare IM given date fields will be blank) Date Medicare IM given:  03/03/2014 Medicare IM given by:  Lizabeth Leyden Date Additional Medicare IM given:   Additional Medicare IM given by:    Discharge Disposition:  [place]  Per UR Regulation:    If discussed at Long Length of Stay Meetings, dates discussed:    Comments:  03/02/2014 [place], [place] [phone] plan for discharge to [facility]  02/28/2014  [place], [place] Plan for discharge to SNF for rehab.

## 2014-03-03 NOTE — Progress Notes (Signed)
Report called to [facility] at [facility]

## 2014-03-03 NOTE — Plan of Care (Signed)
Problem: Phase III Progression Outcomes Goal: Pain controlled on oral analgesia Patient in severe 10 out of 10 pain uncontrolled by PO medications. Used morphine and dilaudid IV to control patient's pain. Unable to transition to PO.

## 2014-03-06 ENCOUNTER — Other Ambulatory Visit: Payer: Self-pay | Admitting: *Deleted

## 2014-03-06 MED ORDER — HYDROCODONE-ACETAMINOPHEN 5-325 MG PO TABS: 1.0000 | ORAL_TABLET | ORAL | Status: DC | PRN

## 2014-03-06 NOTE — Telephone Encounter (Signed)
[facility] 

## 2014-03-10 ENCOUNTER — Other Ambulatory Visit: Payer: Self-pay | Admitting: *Deleted

## 2014-03-10 MED ORDER — LORAZEPAM 0.5 MG PO TABS: ORAL_TABLET | ORAL | Status: DC

## 2014-03-10 NOTE — Telephone Encounter (Signed)
[facility] 

## 2014-03-12 ENCOUNTER — Encounter (HOSPITAL_COMMUNITY): Payer: Medicare Other | Admitting: Anesthesiology

## 2014-03-12 ENCOUNTER — Other Ambulatory Visit: Payer: Self-pay | Admitting: Internal Medicine

## 2014-03-12 ENCOUNTER — Encounter (HOSPITAL_COMMUNITY): Payer: Self-pay | Admitting: Emergency Medicine

## 2014-03-12 ENCOUNTER — Inpatient Hospital Stay (HOSPITAL_COMMUNITY)
Admission: EM | Admit: 2014-03-12 | Discharge: 2014-03-16 | DRG: 856 | Disposition: A | Discharge status: Skilled Nursing Facility | Payer: Medicare Other | Attending: Neurosurgery | Admitting: Neurosurgery

## 2014-03-12 ENCOUNTER — Encounter (HOSPITAL_COMMUNITY)
Admission: EM | Disposition: A | Discharge status: Skilled Nursing Facility | Payer: Self-pay | Source: Home / Self Care | Attending: Neurosurgery

## 2014-03-12 ENCOUNTER — Emergency Department (HOSPITAL_COMMUNITY): Payer: Medicare Other

## 2014-03-12 ENCOUNTER — Emergency Department (HOSPITAL_COMMUNITY): Payer: Medicare Other | Admitting: Anesthesiology

## 2014-03-12 DIAGNOSIS — J45909 Unspecified asthma, uncomplicated: Secondary | ICD-10-CM | POA: Diagnosis not present

## 2014-03-12 DIAGNOSIS — Z9181 History of falling: Secondary | ICD-10-CM

## 2014-03-12 DIAGNOSIS — G061 Intraspinal abscess and granuloma: Secondary | ICD-10-CM | POA: Diagnosis present

## 2014-03-12 DIAGNOSIS — E213 Hyperparathyroidism, unspecified: Secondary | ICD-10-CM

## 2014-03-12 DIAGNOSIS — Y838 Other surgical procedures as the cause of abnormal reaction of the patient, or of later complication, without mention of misadventure at the time of the procedure: Secondary | ICD-10-CM | POA: Diagnosis present

## 2014-03-12 DIAGNOSIS — E43 Unspecified severe protein-calorie malnutrition: Secondary | ICD-10-CM | POA: Diagnosis present

## 2014-03-12 DIAGNOSIS — E876 Hypokalemia: Secondary | ICD-10-CM | POA: Diagnosis not present

## 2014-03-12 DIAGNOSIS — T8140XA Infection following a procedure, unspecified, initial encounter: Secondary | ICD-10-CM | POA: Diagnosis not present

## 2014-03-12 DIAGNOSIS — M48062 Spinal stenosis, lumbar region with neurogenic claudication: Secondary | ICD-10-CM

## 2014-03-12 DIAGNOSIS — R5381 Other malaise: Secondary | ICD-10-CM | POA: Diagnosis present

## 2014-03-12 DIAGNOSIS — Z6822 Body mass index (BMI) 22.0-22.9, adult: Secondary | ICD-10-CM | POA: Diagnosis not present

## 2014-03-12 DIAGNOSIS — M4626 Osteomyelitis of vertebra, lumbar region: Secondary | ICD-10-CM | POA: Diagnosis present

## 2014-03-12 DIAGNOSIS — I10 Essential (primary) hypertension: Secondary | ICD-10-CM | POA: Diagnosis not present

## 2014-03-12 DIAGNOSIS — G2581 Restless legs syndrome: Secondary | ICD-10-CM | POA: Diagnosis not present

## 2014-03-12 DIAGNOSIS — K219 Gastro-esophageal reflux disease without esophagitis: Secondary | ICD-10-CM

## 2014-03-12 DIAGNOSIS — R296 Repeated falls: Secondary | ICD-10-CM

## 2014-03-12 DIAGNOSIS — L97509 Non-pressure chronic ulcer of other part of unspecified foot with unspecified severity: Secondary | ICD-10-CM | POA: Diagnosis present

## 2014-03-12 DIAGNOSIS — Z96652 Presence of left artificial knee joint: Secondary | ICD-10-CM | POA: Diagnosis present

## 2014-03-12 DIAGNOSIS — IMO0002 Reserved for concepts with insufficient information to code with codable children: Secondary | ICD-10-CM | POA: Diagnosis not present

## 2014-03-12 DIAGNOSIS — T814XXA Infection following a procedure, initial encounter: Principal | ICD-10-CM | POA: Diagnosis present

## 2014-03-12 DIAGNOSIS — M869 Osteomyelitis, unspecified: Secondary | ICD-10-CM | POA: Diagnosis not present

## 2014-03-12 DIAGNOSIS — M5416 Radiculopathy, lumbar region: Secondary | ICD-10-CM

## 2014-03-12 DIAGNOSIS — G4733 Obstructive sleep apnea (adult) (pediatric): Secondary | ICD-10-CM | POA: Diagnosis present

## 2014-03-12 DIAGNOSIS — Z79899 Other long term (current) drug therapy: Secondary | ICD-10-CM | POA: Diagnosis not present

## 2014-03-12 DIAGNOSIS — D518 Other vitamin B12 deficiency anemias: Secondary | ICD-10-CM

## 2014-03-12 DIAGNOSIS — Z96659 Presence of unspecified artificial knee joint: Secondary | ICD-10-CM | POA: Diagnosis not present

## 2014-03-12 DIAGNOSIS — D638 Anemia in other chronic diseases classified elsewhere: Secondary | ICD-10-CM | POA: Diagnosis not present

## 2014-03-12 DIAGNOSIS — M549 Dorsalgia, unspecified: Secondary | ICD-10-CM | POA: Diagnosis present

## 2014-03-12 HISTORY — PX: LUMBAR WOUND DEBRIDEMENT: SHX1988

## 2014-03-12 LAB — CBC WITH DIFFERENTIAL/PLATELET
BASOS ABS: 0 10*3/uL (ref 0.0–0.1)
Basophils Relative: 0 % (ref 0–1)
Eosinophils Absolute: 0 10*3/uL (ref 0.0–0.7)
Eosinophils Relative: 1 % (ref 0–5)
HCT: 30.7 % — ABNORMAL LOW (ref 39.0–52.0)
Hemoglobin: 9.8 g/dL — ABNORMAL LOW (ref 13.0–17.0)
LYMPHS ABS: 1.1 10*3/uL (ref 0.7–4.0)
LYMPHS PCT: 15 % (ref 12–46)
MCH: 28.8 pg (ref 26.0–34.0)
MCHC: 31.9 g/dL (ref 30.0–36.0)
MCV: 90.3 fL (ref 78.0–100.0)
Monocytes Absolute: 0.5 10*3/uL (ref 0.1–1.0)
Monocytes Relative: 7 % (ref 3–12)
Neutro Abs: 5.5 10*3/uL (ref 1.7–7.7)
Neutrophils Relative %: 77 % (ref 43–77)
Platelets: 210 10*3/uL (ref 150–400)
RBC: 3.4 MIL/uL — AB (ref 4.22–5.81)
RDW: 14 % (ref 11.5–15.5)
WBC: 7.2 10*3/uL (ref 4.0–10.5)

## 2014-03-12 LAB — COMPREHENSIVE METABOLIC PANEL
ALBUMIN: 2.3 g/dL — AB (ref 3.5–5.2)
ALT: 13 U/L (ref 0–53)
ANION GAP: 10 (ref 5–15)
AST: 21 U/L (ref 0–37)
Alkaline Phosphatase: 98 U/L (ref 39–117)
BUN: 17 mg/dL (ref 6–23)
CALCIUM: 8.5 mg/dL (ref 8.4–10.5)
CHLORIDE: 102 meq/L (ref 96–112)
CO2: 24 meq/L (ref 19–32)
Creatinine, Ser: 0.73 mg/dL (ref 0.50–1.35)
GFR calc Af Amer: >90 mL/min (ref >90)
GFR, EST NON AFRICAN AMERICAN: 85 mL/min — AB (ref >90)
Glucose, Bld: 97 mg/dL (ref 70–99)
Potassium: 3.6 mEq/L — ABNORMAL LOW (ref 3.7–5.3)
Sodium: 136 mEq/L — ABNORMAL LOW (ref 137–147)
Total Bilirubin: 0.3 mg/dL (ref 0.3–1.2)
Total Protein: 7 g/dL (ref 6.0–8.3)

## 2014-03-12 LAB — APTT: aPTT: 35 seconds (ref 24–37)

## 2014-03-12 LAB — C-REACTIVE PROTEIN: CRP: 5.2 mg/dL — AB (ref <0.60)

## 2014-03-12 LAB — SEDIMENTATION RATE: Sed Rate: 56 mm/hr — ABNORMAL HIGH (ref 0–16)

## 2014-03-12 LAB — PROTIME-INR
INR: 1.16 (ref 0.00–1.49)
PROTHROMBIN TIME: 14.8 s (ref 11.6–15.2)

## 2014-03-12 SURGERY — LUMBAR WOUND DEBRIDEMENT
Anesthesia: General | Site: Back

## 2014-03-12 MED ORDER — BISACODYL 10 MG RE SUPP: 10.0000 mg | RECTAL | Status: DC | PRN

## 2014-03-12 MED ORDER — HYDROMORPHONE HCL 1 MG/ML IJ SOLN
1.0000 mg | Freq: Once | INTRAMUSCULAR | Status: AC
  Administered 2014-03-12: 1 mg via INTRAVENOUS
  Filled 2014-03-12: qty 1

## 2014-03-12 MED ORDER — HYDROCODONE-ACETAMINOPHEN 5-325 MG PO TABS: 1.0000 | ORAL_TABLET | ORAL | Status: DC | PRN

## 2014-03-12 MED ORDER — EPHEDRINE SULFATE 50 MG/ML IJ SOLN
INTRAMUSCULAR | Status: DC | PRN
  Administered 2014-03-12: 10 mg via INTRAVENOUS
  Administered 2014-03-12 (×2): 5 mg via INTRAVENOUS

## 2014-03-12 MED ORDER — LIDOCAINE HCL (CARDIAC) 20 MG/ML IV SOLN
INTRAVENOUS | Status: DC | PRN
  Administered 2014-03-12: 60 mg via INTRAVENOUS
  Administered 2014-03-12: 40 mg via INTRAVENOUS
  Administered 2014-03-12: 50 mg via INTRAVENOUS

## 2014-03-12 MED ORDER — IPRATROPIUM-ALBUTEROL 0.5-2.5 (3) MG/3ML IN SOLN: 3.0000 mL | RESPIRATORY_TRACT | Status: DC | PRN

## 2014-03-12 MED ORDER — OXYCODONE HCL 5 MG/5ML PO SOLN: 5.0000 mg | Freq: Once | ORAL | Status: DC | PRN

## 2014-03-12 MED ORDER — BUPIVACAINE HCL (PF) 0.25 % IJ SOLN
INTRAMUSCULAR | Status: DC | PRN
  Administered 2014-03-12: 5 mL

## 2014-03-12 MED ORDER — FENTANYL CITRATE 0.05 MG/ML IJ SOLN
INTRAMUSCULAR | Status: DC | PRN
  Administered 2014-03-12 (×3): 50 ug via INTRAVENOUS

## 2014-03-12 MED ORDER — ENSURE COMPLETE PO LIQD: 237.0000 mL | Freq: Two times a day (BID) | ORAL | Status: DC

## 2014-03-12 MED ORDER — ACETAMINOPHEN 325 MG PO TABS: 650.0000 mg | ORAL_TABLET | Freq: Four times a day (QID) | ORAL | Status: DC | PRN

## 2014-03-12 MED ORDER — GLYCOPYRROLATE 0.2 MG/ML IJ SOLN
INTRAMUSCULAR | Status: DC | PRN
  Administered 2014-03-12: 0.3 mg via INTRAVENOUS

## 2014-03-12 MED ORDER — PRAMIPEXOLE DIHYDROCHLORIDE 0.125 MG PO TABS
1.0000 mg | ORAL_TABLET | Freq: Every day | ORAL | Status: DC
  Administered 2014-03-13 – 2014-03-16 (×4): 1 mg via ORAL
  Filled 2014-03-12 (×4): qty 8

## 2014-03-12 MED ORDER — OXYCODONE HCL 5 MG PO TABS: 5.0000 mg | ORAL_TABLET | Freq: Once | ORAL | Status: DC | PRN

## 2014-03-12 MED ORDER — GADOBENATE DIMEGLUMINE 529 MG/ML IV SOLN
15.0000 mL | Freq: Once | INTRAVENOUS | Status: AC | PRN
  Administered 2014-03-12: 15 mL via INTRAVENOUS

## 2014-03-12 MED ORDER — PROPOFOL 10 MG/ML IV BOLUS
INTRAVENOUS | Status: DC | PRN
  Administered 2014-03-12: 110 mg via INTRAVENOUS
  Administered 2014-03-12: 20 mg via INTRAVENOUS

## 2014-03-12 MED ORDER — DEXTROSE 5 % IV SOLN
2.0000 g | Freq: Once | INTRAVENOUS | Status: AC
  Administered 2014-03-12: 2 g via INTRAVENOUS
  Filled 2014-03-12: qty 2

## 2014-03-12 MED ORDER — PRAMIPEXOLE DIHYDROCHLORIDE 1 MG PO TABS
2.0000 mg | ORAL_TABLET | Freq: Every day | ORAL | Status: DC
  Administered 2014-03-12 – 2014-03-15 (×3): 2 mg via ORAL
  Filled 2014-03-12 (×6): qty 2

## 2014-03-12 MED ORDER — VANCOMYCIN HCL 1000 MG IV SOLR
1000.0000 mg | Freq: Once | INTRAVENOUS | Status: AC
  Administered 2014-03-12: 1000 mg via INTRAVENOUS
  Filled 2014-03-12: qty 1000

## 2014-03-12 MED ORDER — LIDOCAINE-EPINEPHRINE 1 %-1:100000 IJ SOLN
INTRAMUSCULAR | Status: DC | PRN
  Administered 2014-03-12: 5 mL

## 2014-03-12 MED ORDER — ARTIFICIAL TEARS OP OINT
TOPICAL_OINTMENT | OPHTHALMIC | Status: DC | PRN
  Administered 2014-03-12: 1 via OPHTHALMIC

## 2014-03-12 MED ORDER — SODIUM CHLORIDE 0.9 % IR SOLN
Status: DC | PRN
  Administered 2014-03-12: 19:00:00

## 2014-03-12 MED ORDER — HYDROMORPHONE HCL 1 MG/ML IJ SOLN
1.0000 mg | Freq: Once | INTRAMUSCULAR | Status: AC
  Administered 2014-03-12: 1 mg via INTRAVENOUS

## 2014-03-12 MED ORDER — THROMBIN 5000 UNITS EX SOLR
CUTANEOUS | Status: DC | PRN
  Administered 2014-03-12 (×2): 5000 [IU] via TOPICAL

## 2014-03-12 MED ORDER — ALUM & MAG HYDROXIDE-SIMETH 200-200-20 MG/5ML PO SUSP: 30.0000 mL | Freq: Four times a day (QID) | ORAL | Status: DC | PRN

## 2014-03-12 MED ORDER — DEXTROSE 5 % IV SOLN
1.0000 g | Freq: Three times a day (TID) | INTRAVENOUS | Status: DC
  Administered 2014-03-12 – 2014-03-16 (×11): 1 g via INTRAVENOUS
  Filled 2014-03-12 (×16): qty 1

## 2014-03-12 MED ORDER — SODIUM CHLORIDE 0.9 % IJ SOLN: 3.0000 mL | INTRAMUSCULAR | Status: DC | PRN

## 2014-03-12 MED ORDER — ROCURONIUM BROMIDE 100 MG/10ML IV SOLN
INTRAVENOUS | Status: DC | PRN
  Administered 2014-03-12: 30 mg via INTRAVENOUS

## 2014-03-12 MED ORDER — HYDROMORPHONE HCL 1 MG/ML IJ SOLN: 0.2500 mg | INTRAMUSCULAR | Status: DC | PRN

## 2014-03-12 MED ORDER — VANCOMYCIN HCL IN DEXTROSE 1-5 GM/200ML-% IV SOLN
1000.0000 mg | Freq: Two times a day (BID) | INTRAVENOUS | Status: DC
  Administered 2014-03-13 – 2014-03-16 (×7): 1000 mg via INTRAVENOUS
  Filled 2014-03-12 (×9): qty 200

## 2014-03-12 MED ORDER — HYDROMORPHONE HCL 1 MG/ML IJ SOLN
0.5000 mg | INTRAMUSCULAR | Status: DC | PRN
  Administered 2014-03-13 (×3): 1 mg via INTRAVENOUS
  Filled 2014-03-12 (×3): qty 1

## 2014-03-12 MED ORDER — HYDROMORPHONE HCL 1 MG/ML IJ SOLN
0.5000 mg | Freq: Once | INTRAMUSCULAR | Status: AC
  Administered 2014-03-12: 0.5 mg via INTRAVENOUS
  Filled 2014-03-12: qty 1

## 2014-03-12 MED ORDER — ONDANSETRON HCL 4 MG/2ML IJ SOLN
4.0000 mg | INTRAMUSCULAR | Status: DC | PRN
  Administered 2014-03-13: 4 mg via INTRAVENOUS
  Filled 2014-03-12 (×2): qty 2

## 2014-03-12 MED ORDER — PRAMIPEXOLE DIHYDROCHLORIDE 1 MG PO TABS
2.0000 mg | ORAL_TABLET | Freq: Every morning | ORAL | Status: DC
  Administered 2014-03-13 – 2014-03-16 (×4): 2 mg via ORAL
  Filled 2014-03-12 (×4): qty 2

## 2014-03-12 MED ORDER — LACTATED RINGERS IV SOLN
INTRAVENOUS | Status: DC | PRN
  Administered 2014-03-12: 18:00:00 via INTRAVENOUS

## 2014-03-12 MED ORDER — 0.9 % SODIUM CHLORIDE (POUR BTL) OPTIME
TOPICAL | Status: DC | PRN
  Administered 2014-03-12: 1000 mL

## 2014-03-12 MED ORDER — DEXTROSE 5 % IV SOLN
INTRAVENOUS | Status: DC | PRN
  Administered 2014-03-12: 19:00:00 via INTRAVENOUS

## 2014-03-12 MED ORDER — HYDROMORPHONE HCL 1 MG/ML IJ SOLN
INTRAMUSCULAR | Status: AC
  Filled 2014-03-12: qty 1

## 2014-03-12 MED ORDER — LOSARTAN POTASSIUM 50 MG PO TABS
50.0000 mg | ORAL_TABLET | Freq: Every day | ORAL | Status: DC
  Administered 2014-03-12 – 2014-03-15 (×4): 50 mg via ORAL
  Filled 2014-03-12 (×4): qty 1

## 2014-03-12 MED ORDER — DEXAMETHASONE SODIUM PHOSPHATE 4 MG/ML IJ SOLN
INTRAMUSCULAR | Status: DC | PRN
  Administered 2014-03-12: 6 mg via INTRAVENOUS

## 2014-03-12 MED ORDER — NEOSTIGMINE METHYLSULFATE 10 MG/10ML IV SOLN
INTRAVENOUS | Status: DC | PRN
  Administered 2014-03-12: 2.5 mg via INTRAVENOUS

## 2014-03-12 MED ORDER — ONDANSETRON HCL 4 MG/2ML IJ SOLN
INTRAMUSCULAR | Status: DC | PRN
  Administered 2014-03-12: 4 mg via INTRAVENOUS

## 2014-03-12 MED ORDER — PROMETHAZINE HCL 25 MG/ML IJ SOLN: 6.2500 mg | INTRAMUSCULAR | Status: DC | PRN

## 2014-03-12 MED ORDER — SODIUM CHLORIDE 0.9 % IV SOLN
INTRAVENOUS | Status: DC
  Administered 2014-03-12: 1000 mL via INTRAVENOUS
  Administered 2014-03-12: 09:00:00 via INTRAVENOUS

## 2014-03-12 MED ORDER — SODIUM CHLORIDE 0.9 % IV SOLN: 250.0000 mL | INTRAVENOUS | Status: DC

## 2014-03-12 MED ORDER — POTASSIUM CHLORIDE IN NACL 20-0.45 MEQ/L-% IV SOLN
INTRAVENOUS | Status: DC
Start: 2014-03-12 — End: 2014-03-15
  Filled 2014-03-12 (×5): qty 1000

## 2014-03-12 MED ORDER — VANCOMYCIN HCL 1000 MG IV SOLR
INTRAVENOUS | Status: DC | PRN
  Administered 2014-03-12: 1000 mg

## 2014-03-12 MED ORDER — PHENOL 1.4 % MT LIQD: 1.0000 | OROMUCOSAL | Status: DC | PRN

## 2014-03-12 MED ORDER — DEXTROSE 5 % IV SOLN: 2.0000 g | INTRAVENOUS | Status: DC

## 2014-03-12 MED ORDER — SENNOSIDES-DOCUSATE SODIUM 8.6-50 MG PO TABS
2.0000 | ORAL_TABLET | Freq: Every day | ORAL | Status: DC
  Administered 2014-03-12 – 2014-03-15 (×4): 2 via ORAL
  Filled 2014-03-12 (×4): qty 2

## 2014-03-12 MED ORDER — PRAMIPEXOLE DIHYDROCHLORIDE 0.125 MG PO TABS: 1.0000 mg | ORAL_TABLET | Freq: Three times a day (TID) | ORAL | Status: DC

## 2014-03-12 MED ORDER — HEMOSTATIC AGENTS (NO CHARGE) OPTIME
TOPICAL | Status: DC | PRN
  Administered 2014-03-12: 1 via TOPICAL

## 2014-03-12 MED ORDER — OXYCODONE-ACETAMINOPHEN 5-325 MG PO TABS
1.0000 | ORAL_TABLET | ORAL | Status: DC | PRN
  Administered 2014-03-13 – 2014-03-16 (×12): 2 via ORAL
  Filled 2014-03-12 (×12): qty 2

## 2014-03-12 MED ORDER — FLEET ENEMA 7-19 GM/118ML RE ENEM: 1.0000 | ENEMA | Freq: Every day | RECTAL | Status: DC | PRN

## 2014-03-12 MED ORDER — LORAZEPAM 2 MG/ML IJ SOLN
1.0000 mg | Freq: Once | INTRAMUSCULAR | Status: AC
  Administered 2014-03-12: 1 mg via INTRAVENOUS
  Filled 2014-03-12: qty 1

## 2014-03-12 MED ORDER — SODIUM CHLORIDE 0.9 % IJ SOLN
3.0000 mL | Freq: Two times a day (BID) | INTRAMUSCULAR | Status: DC
Start: 2014-03-12 — End: 2014-03-16

## 2014-03-12 MED ORDER — MENTHOL 3 MG MT LOZG: 1.0000 | LOZENGE | OROMUCOSAL | Status: DC | PRN

## 2014-03-12 MED ORDER — METHOCARBAMOL 500 MG PO TABS
500.0000 mg | ORAL_TABLET | Freq: Four times a day (QID) | ORAL | Status: DC | PRN
  Administered 2014-03-13 – 2014-03-16 (×5): 500 mg via ORAL
  Filled 2014-03-12 (×5): qty 1

## 2014-03-12 SURGICAL SUPPLY — 75 items
ADH SKN CLS APL DERMABOND .7 (GAUZE/BANDAGES/DRESSINGS) ×1
APL SKNCLS STERI-STRIP NONHPOA (GAUZE/BANDAGES/DRESSINGS) ×1
BAG DECANTER FOR FLEXI CONT (MISCELLANEOUS) ×2 IMPLANT
BENZOIN TINCTURE PRP APPL 2/3 (GAUZE/BANDAGES/DRESSINGS) ×2 IMPLANT
BLADE SURG ROTATE 9660 (MISCELLANEOUS) IMPLANT
BRUSH SCRUB EZ PLAIN DRY (MISCELLANEOUS) ×2 IMPLANT
CANISTER SUCT 3000ML (MISCELLANEOUS) ×2 IMPLANT
CONT SPEC 4OZ CLIKSEAL STRL BL (MISCELLANEOUS) ×2 IMPLANT
CORDS BIPOLAR (ELECTRODE) ×2 IMPLANT
DECANTER SPIKE VIAL GLASS SM (MISCELLANEOUS) ×2 IMPLANT
DERMABOND ADVANCED (GAUZE/BANDAGES/DRESSINGS) ×1
DERMABOND ADVANCED .7 DNX12 (GAUZE/BANDAGES/DRESSINGS) ×1 IMPLANT
DRAPE LAPAROTOMY 100X72X124 (DRAPES) ×2 IMPLANT
DRAPE POUCH INSTRU U-SHP 10X18 (DRAPES) ×2 IMPLANT
DRAPE SURG 17X23 STRL (DRAPES) IMPLANT
DRSG OPSITE 4X5.5 SM (GAUZE/BANDAGES/DRESSINGS) ×2 IMPLANT
DRSG OPSITE POSTOP 4X6 (GAUZE/BANDAGES/DRESSINGS) ×1 IMPLANT
ELECT REM PT RETURN 9FT ADLT (ELECTROSURGICAL) ×2
ELECTRODE REM PT RTRN 9FT ADLT (ELECTROSURGICAL) ×1 IMPLANT
EVACUATOR 1/8 PVC DRAIN (DRAIN) IMPLANT
EVACUATOR 3/16  PVC DRAIN (DRAIN) ×1
EVACUATOR 3/16 PVC DRAIN (DRAIN) IMPLANT
GAUZE SPONGE 4X4 12PLY STRL (GAUZE/BANDAGES/DRESSINGS) ×2 IMPLANT
GAUZE SPONGE 4X4 16PLY XRAY LF (GAUZE/BANDAGES/DRESSINGS) IMPLANT
GLOVE BIO SURGEON STRL SZ 6.5 (GLOVE) IMPLANT
GLOVE BIO SURGEON STRL SZ7 (GLOVE) IMPLANT
GLOVE BIO SURGEON STRL SZ7.5 (GLOVE) IMPLANT
GLOVE BIO SURGEON STRL SZ8 (GLOVE) ×2 IMPLANT
GLOVE BIO SURGEON STRL SZ8.5 (GLOVE) IMPLANT
GLOVE BIOGEL M 8.0 STRL (GLOVE) IMPLANT
GLOVE BIOGEL PI IND STRL 7.0 (GLOVE) IMPLANT
GLOVE BIOGEL PI INDICATOR 7.0 (GLOVE) ×2
GLOVE ECLIPSE 6.5 STRL STRAW (GLOVE) IMPLANT
GLOVE ECLIPSE 7.0 STRL STRAW (GLOVE) IMPLANT
GLOVE ECLIPSE 7.5 STRL STRAW (GLOVE) IMPLANT
GLOVE ECLIPSE 8.0 STRL XLNG CF (GLOVE) IMPLANT
GLOVE ECLIPSE 8.5 STRL (GLOVE) IMPLANT
GLOVE EXAM NITRILE LRG STRL (GLOVE) IMPLANT
GLOVE EXAM NITRILE MD LF STRL (GLOVE) IMPLANT
GLOVE EXAM NITRILE XL STR (GLOVE) IMPLANT
GLOVE EXAM NITRILE XS STR PU (GLOVE) IMPLANT
GLOVE INDICATOR 6.5 STRL GRN (GLOVE) IMPLANT
GLOVE INDICATOR 7.0 STRL GRN (GLOVE) IMPLANT
GLOVE INDICATOR 7.5 STRL GRN (GLOVE) IMPLANT
GLOVE INDICATOR 8.0 STRL GRN (GLOVE) IMPLANT
GLOVE INDICATOR 8.5 STRL (GLOVE) ×2 IMPLANT
GLOVE OPTIFIT SS 6.5 STRL BRWN (GLOVE) ×4 IMPLANT
GLOVE OPTIFIT SS 8.0 STRL (GLOVE) IMPLANT
GLOVE SURG SS PI 6.5 STRL IVOR (GLOVE) IMPLANT
GOWN STRL REUS W/ TWL LRG LVL3 (GOWN DISPOSABLE) ×1 IMPLANT
GOWN STRL REUS W/ TWL XL LVL3 (GOWN DISPOSABLE) ×1 IMPLANT
GOWN STRL REUS W/TWL 2XL LVL3 (GOWN DISPOSABLE) ×1 IMPLANT
GOWN STRL REUS W/TWL LRG LVL3 (GOWN DISPOSABLE) ×2
GOWN STRL REUS W/TWL XL LVL3 (GOWN DISPOSABLE) ×2
KIT BASIN OR (CUSTOM PROCEDURE TRAY) ×2 IMPLANT
KIT ROOM TURNOVER OR (KITS) ×2 IMPLANT
NDL HYPO 25X1 1.5 SAFETY (NEEDLE) IMPLANT
NEEDLE HYPO 25X1 1.5 SAFETY (NEEDLE) IMPLANT
NS IRRIG 1000ML POUR BTL (IV SOLUTION) ×2 IMPLANT
PACK LAMINECTOMY NEURO (CUSTOM PROCEDURE TRAY) ×2 IMPLANT
PATTIES SURGICAL .75X.75 (GAUZE/BANDAGES/DRESSINGS) IMPLANT
SPONGE SURGIFOAM ABS GEL SZ50 (HEMOSTASIS) ×2 IMPLANT
STRIP CLOSURE SKIN 1/2X4 (GAUZE/BANDAGES/DRESSINGS) ×2 IMPLANT
SUT VIC AB 0 CT1 18XCR BRD8 (SUTURE) ×1 IMPLANT
SUT VIC AB 0 CT1 8-18 (SUTURE) ×2
SUT VIC AB 2-0 CT1 18 (SUTURE) ×3 IMPLANT
SUT VICRYL 4-0 PS2 18IN ABS (SUTURE) ×2 IMPLANT
SWAB CULTURE LIQ STUART DBL (MISCELLANEOUS) ×2 IMPLANT
SYR 20ML ECCENTRIC (SYRINGE) ×2 IMPLANT
SYR CONTROL 10ML LL (SYRINGE) IMPLANT
TAPE STRIPS DRAPE STRL (GAUZE/BANDAGES/DRESSINGS) ×1 IMPLANT
TOWEL OR 17X24 6PK STRL BLUE (TOWEL DISPOSABLE) ×2 IMPLANT
TOWEL OR 17X26 10 PK STRL BLUE (TOWEL DISPOSABLE) ×2 IMPLANT
TUBE ANAEROBIC SPECIMEN COL (MISCELLANEOUS) ×2 IMPLANT
WATER STERILE IRR 1000ML POUR (IV SOLUTION) ×2 IMPLANT

## 2014-03-12 NOTE — Transfer of Care (Deleted)
Immediate Anesthesia Transfer of Care Note  Patient: Derrick Sanchez  Procedure(s) Performed: Procedure(s) with comments: LUMBAR WOUND DEBRIDEMENT (N/A) - LUMBAR WOUND DEBRIDEMENT  Patient Location: PACU  Anesthesia Type:General  Level of Consciousness: oriented, sedated, patient cooperative and responds to stimulation  Airway & Oxygen Therapy: Patient Spontanous Breathing and Patient connected to nasal cannula oxygen  Post-op Assessment: Report given to PACU RN, Post -op Vital signs reviewed and stable, Patient moving all extremities and Patient moving all extremities X 4  Post vital signs: Reviewed and stable  Complications: No apparent anesthesia complications

## 2014-03-12 NOTE — Progress Notes (Signed)
ANTIBIOTIC CONSULT NOTE - INITIAL  Pharmacy Consult for vancomycin and cefepime Indication: lumbar abscess w/ suspected osteomyelitis  No Known Allergies  Patient Measurements: Height: 6' (182.9 cm) Weight: 164 lb 14.5 oz (74.8 kg) IBW/kg (Calculated) : 77.6  Vital Signs: Temp: 97.5 F (36.4 C) (09/27 2035) Temp src: Oral (09/27 2035) BP: 147/66 mmHg (09/27 2035) Pulse Rate: 67 (09/27 2035)  Labs:  Recent Labs  03/12/14 1515  WBC 7.2  HGB 9.8*  PLT 210  CREATININE 0.73   Estimated Creatinine Clearance: 76.6 ml/min (by C-G formula based on Cr of 0.73).   Medical History: Past Medical History  Diagnosis Date  . Obstructive sleep apnea (adult) (pediatric)   . Unspecified asthma(493.90)   . Hyperparathyroidism, unspecified   . Other vitamin B12 deficiency anemia   . Restless legs syndrome (RLS)   . Esophageal reflux 2008    EGD  . Diverticulosis of colon (without mention of hemorrhage) 2008,2006,2003    Colonoscopy  . Glaucoma   . Internal hemorrhoids without mention of complication 0865    Colonoscopy  . Candidiasis of the esophagus 07-17-2000    EGD  . Stricture and stenosis of esophagus 05-27-2002    EGD  . Colon polyps 05-27-2002    Tubulovillous Adenoma-Colonoscopy  . Hiatal hernia 2002,2003,2013    EGD  . Allergy   . Arthritis   . Blood transfusion   . Cataract     bil eyes  . Hypertension   . Pneumonia     years ago  . Multiple falls     Pt falls asleep without warning and has encounter multiple falls    Medications:  Prescriptions prior to admission  Medication Sig Dispense Refill  . acetaminophen (TYLENOL) 325 MG tablet Take 2 tablets (650 mg total) by mouth every 6 (six) hours as needed for mild pain (or Fever >/= 101).      Marland Kitchen b complex vitamins tablet Take 1 tablet by mouth every morning.       . bisacodyl (DULCOLAX) 10 MG suppository Place 1 suppository (10 mg total) rectally as needed for moderate constipation.  12 suppository  0  .  enoxaparin (LOVENOX) 40 MG/0.4ML injection Inject 0.4 mLs (40 mg total) into the skin daily. For 21 days  0 Syringe    . feeding supplement, ENSURE COMPLETE, (ENSURE COMPLETE) LIQD Take 237 mLs by mouth 2 (two) times daily between meals.      Marland Kitchen HYDROcodone-acetaminophen (NORCO/VICODIN) 5-325 MG per tablet Take 1-2 tablets by mouth every 4 (four) hours as needed for moderate pain or severe pain (mild pain).  360 tablet  0  . LORazepam (ATIVAN) 0.5 MG tablet Take one tablet by mouth three times daily as needed for anxiety  90 tablet  5  . losartan (COZAAR) 50 MG tablet Take 50 mg by mouth at bedtime.       . methocarbamol (ROBAXIN) 500 MG tablet Take 1 tablet (500 mg total) by mouth every 6 (six) hours as needed for muscle spasms.  20 tablet  0  . pramipexole (MIRAPEX) 1 MG tablet Take 1-2 mg by mouth 3 (three) times daily. Take 2 mg by mouth in the morning, take 1 mg by mouth at lunch time, and take 2 mg by mouth in the evening.      . senna-docusate (SENOKOT-S) 8.6-50 MG per tablet Take 2 tablets by mouth at bedtime.      . sodium phosphate (FLEET) 7-19 GM/118ML ENEM Place 133 mLs (1 enema total) rectally daily  as needed for severe constipation.    0   Scheduled:  . ceFEPime (MAXIPIME) IV  1 g Intravenous 3 times per day  . [START ON 03/13/2014] feeding supplement (ENSURE COMPLETE)  237 mL Oral BID BM  . losartan  50 mg Oral QHS  . [START ON 03/13/2014] pramipexole  2 mg Oral q morning - 10a   And  . [START ON 03/13/2014] pramipexole  1 mg Oral Q1200   And  . pramipexole  2 mg Oral QHS  . senna-docusate  2 tablet Oral QHS  . sodium chloride  3 mL Intravenous Q12H  . [START ON 03/13/2014] vancomycin  1,000 mg Intravenous Q12H   Infusions:  . 0.45 % NaCl with KCl 20 mEq / L    . sodium chloride 100 mL/hr at 03/12/14 0858  . sodium chloride      Assessment: 80yo male had recent back surgery, c/o worsening non-radiating right hip pain x3-4wk, MRI highly suggestive of post-op epidural abscess  and osteomyelitis, now s/p I&D, to begin IV ABX.  Goal of Therapy:  Vancomycin trough level 15-20 mcg/ml  Plan:  Rec'd vanc 1g and ceftriaxone 1g perioperatively; will continue with vancomycin 1g IV Q12H and cefepime 1g IV Q8H and monitor CBC, Cx, levels prn.  Wynona Neat, PharmD, BCPS  03/12/2014,10:39 PM

## 2014-03-12 NOTE — Transfer of Care (Signed)
Immediate Anesthesia Transfer of Care Note  Patient: Derrick Sanchez  Procedure(s) Performed: Procedure(s) with comments: LUMBAR WOUND DEBRIDEMENT (N/A) - LUMBAR WOUND DEBRIDEMENT  Patient Location: PACU  Anesthesia Type:General  Level of Consciousness: oriented, sedated, patient cooperative and responds to stimulation  Airway & Oxygen Therapy: Patient Spontanous Breathing and Patient connected to nasal cannula oxygen  Post-op Assessment: Report given to PACU RN, Post -op Vital signs reviewed and stable, Patient moving all extremities and Patient moving all extremities X 4  Post vital signs: Reviewed and stable  Complications: No apparent anesthesia complications

## 2014-03-12 NOTE — ED Notes (Addendum)
Pt. Had compression fracture, back surgery recently, also has left greater trochanteric fracture, but pt states he has pain in bilateral hips, sent here from [facility] for eval of severe back pain by Dr. Anastasio Champion

## 2014-03-12 NOTE — H&P (Signed)
Derrick Sanchez is an 80 y.o. male.   Chief Complaint: Back and right hip pain HPI: Patient is an 80 year old gentleman who is one-month status post decompressive laminectomy and fusion who had a fall about a week and a half ago underwent workup with CT scan of his lumbar spine which showed no abnormality of his hardware or his fusion site has been complaining of hip pain for the last week and this got progressively worse in the last couple days. Patient was presented to the [facility] emergency room complaining of a pain was worked up with a CT scan of his right hip that was negative and ultimately an MRI scan of his lumbar spine that was highly suggestive of postoperative infection epidural abscess and osteomyelitis. Patient does report progress worsening back and right hip pain and is ready down the back and outside of his thigh the back and outside of his calf down to his foot. He denies any fevers and chills at home. Workup at any pen we revealed no elevation in his white blood cell count we have an ESR and CRP there still pending. We transfer the patient here for evaluation management. Currently the patient denies any new bowel or bladder difficulty and a numbness in his legs or groin denies any left leg symptoms.  Past Medical History  Diagnosis Date  . Obstructive sleep apnea (adult) (pediatric)   . Unspecified asthma(493.90)   . Hyperparathyroidism, unspecified   . Other vitamin B12 deficiency anemia   . Restless legs syndrome (RLS)   . Esophageal reflux 2008    EGD  . Diverticulosis of colon (without mention of hemorrhage) 2008,2006,2003    Colonoscopy  . Glaucoma   . Internal hemorrhoids without mention of complication 6301    Colonoscopy  . Candidiasis of the esophagus 07-17-2000    EGD  . Stricture and stenosis of esophagus 05-27-2002    EGD  . Colon polyps 05-27-2002    Tubulovillous Adenoma-Colonoscopy  . Hiatal hernia 2002,2003,2013    EGD  . Allergy   . Arthritis   . Blood  transfusion   . Cataract     bil eyes  . Hypertension   . Pneumonia     years ago  . Multiple falls     Pt falls asleep without warning and has encounter multiple falls    Past Surgical History  Procedure Laterality Date  . Parathyroidectomy    . Total knee arthroplasty Left   . Hip surgery Right   . Colonoscopy    . Cataract extraction Bilateral   . Mass excision  10/14/2011    Procedure: EXCISION MASS;  Surgeon: Wynonia Sours, MD;  Location: [facility];  Service: Orthopedics;  Laterality: Left;   left hand  . Arm surgery    . Eye surgery Bilateral     cataracts iol    Family History  Problem Relation Age of Onset  . Brain cancer      3 brothers  . Heart disease Father   . Heart disease Sister   . Colon cancer Neg Hx   . Esophageal cancer Neg Hx   . Rectal cancer Neg Hx   . Stomach cancer Neg Hx   . Leukemia Brother   . Diabetes Neg Hx   . Kidney disease Neg Hx   . Liver disease Neg Hx    Social History:  reports that he has never smoked. He has never used smokeless tobacco. He reports that he does not  drink alcohol or use illicit drugs.  Allergies: No Known Allergies   (Not in a hospital admission)  Results for orders placed during the hospital encounter of 03/12/14 (from the past 48 hour(s))  COMPREHENSIVE METABOLIC PANEL     Status: Abnormal   Collection Time    03/12/14  3:15 PM      Result Value Ref Range   Sodium 136 (*) 137 - 147 mEq/L   Potassium 3.6 (*) 3.7 - 5.3 mEq/L   Chloride 102  96 - 112 mEq/L   CO2 24  19 - 32 mEq/L   Glucose, Bld 97  70 - 99 mg/dL   BUN 17  6 - 23 mg/dL   Creatinine, Ser 0.73  0.50 - 1.35 mg/dL   Calcium 8.5  8.4 - 10.5 mg/dL   Total Protein 7.0  6.0 - 8.3 g/dL   Albumin 2.3 (*) 3.5 - 5.2 g/dL   AST 21  0 - 37 U/L   ALT 13  0 - 53 U/L   Alkaline Phosphatase 98  39 - 117 U/L   Total Bilirubin 0.3  0.3 - 1.2 mg/dL   GFR calc non Af Amer 85 (*) >90 mL/min   GFR calc Af Amer >90  >90 mL/min   Comment:  (NOTE)     The eGFR has been calculated using the CKD EPI equation.     This calculation has not been validated in all clinical situations.     eGFR's persistently <90 mL/min signify possible Chronic Kidney     Disease.   Anion gap 10  5 - 15  CBC WITH DIFFERENTIAL     Status: Abnormal   Collection Time    03/12/14  3:15 PM      Result Value Ref Range   WBC 7.2  4.0 - 10.5 K/uL   RBC 3.40 (*) 4.22 - 5.81 MIL/uL   Hemoglobin 9.8 (*) 13.0 - 17.0 g/dL   HCT 30.7 (*) 39.0 - 52.0 %   MCV 90.3  78.0 - 100.0 fL   MCH 28.8  26.0 - 34.0 pg   MCHC 31.9  30.0 - 36.0 g/dL   RDW 14.0  11.5 - 15.5 %   Platelets 210  150 - 400 K/uL   Neutrophils Relative % 77  43 - 77 %   Neutro Abs 5.5  1.7 - 7.7 K/uL   Lymphocytes Relative 15  12 - 46 %   Lymphs Abs 1.1  0.7 - 4.0 K/uL   Monocytes Relative 7  3 - 12 %   Monocytes Absolute 0.5  0.1 - 1.0 K/uL   Eosinophils Relative 1  0 - 5 %   Eosinophils Absolute 0.0  0.0 - 0.7 K/uL   Basophils Relative 0  0 - 1 %   Basophils Absolute 0.0  0.0 - 0.1 K/uL  SEDIMENTATION RATE     Status: Abnormal   Collection Time    03/12/14  3:15 PM      Result Value Ref Range   Sed Rate 56 (*) 0 - 16 mm/hr  APTT     Status: None   Collection Time    03/12/14  3:15 PM      Result Value Ref Range   aPTT 35  24 - 37 seconds  PROTIME-INR     Status: None   Collection Time    03/12/14  3:15 PM      Result Value Ref Range   Prothrombin Time 14.8  11.6 - 15.2  seconds   INR 1.16  0.00 - 1.49   Mr Lumbar Spine W Wo Contrast  03/12/2014   CLINICAL DATA:  Worsening severe right lower extremity pain and numbness following back surgery 02/06/2014. Question root compression.  EXAM: MRI LUMBAR SPINE WITHOUT AND WITH CONTRAST  TECHNIQUE: Multiplanar and multiecho pulse sequences of the lumbar spine were obtained without and with intravenous contrast.  CONTRAST:  19m MULTIHANCE GADOBENATE DIMEGLUMINE 529 MG/ML IV SOLN  COMPARISON:  Lumbar spine CT 02/27/2014, radiographs  02/27/2014 and MRI 08/25/2013.  FINDINGS: The study is significantly motion degraded, despite repeating several sequences. There are postsurgical changes at L4-5 status post laminectomy and PLIF. Approximately 9 mm of anterolisthesis at the operative level is similar to the preoperative MRI and postoperative CT. However, there is extensive bone marrow edema and enhancement throughout the L4 and L5 vertebral bodies. There is endplate irregularity and enhancement within the L4-5 disc space. These findings are concerning for postoperative infection.  There is a 1.5 cm peripherally enhancing fluid collection within the laminectomy bed. No focal epidural fluid collection is identified. However, there is mass effect on the thecal sac which is posteriorly compressed, best seen on the postcontrast sagittal images (series 9). The AP diameter of the thecal sac is approximately 6 mm.  The alignment is otherwise normal. There are stable endplate degenerative changes at L1-2. The conus medullaris extends to the T12-L1 level. There is no definite abnormal intradural enhancement.  Multiple large renal cysts are noted.  L1-2: Stable disc bulging and endplate degeneration. No significant spinal stenosis or nerve root encroachment.  L2-3: Annular disc bulging and a broad-based left foraminal disc protrusion are grossly stable. There is mild facet and ligamentous hypertrophy. The resulting moderate spinal and left lateral recess stenosis appears unchanged.  L3-4: Mild disc bulging, facet and ligamentous hypertrophy. Mild spinal stenosis. No nerve root encroachment.  L4-5:  Detailed above.  L5-S1: Stable chronic degenerative disc disease with loss of disc height and posterior osteophytes. Mild foraminal narrowing is present bilaterally.  IMPRESSION: 1. Prominent bone marrow edema and enhancement within the L4 and L5 vertebral bodies status post recent laminectomy and PLIF. These findings are worrisome for postoperative infection with  vertebral osteomyelitis. 2. Largely due to the residual anterolisthesis at the operative level, there is compression of the thecal sac. There is a small nonspecific peripherally enhancing fluid collection within the laminectomy bed. No significant epidural fluid collection identified. 3. Stable degenerative changes at the additional levels.   Electronically Signed   By: BCamie PatienceM.D.   On: 03/12/2014 14:18   Ct Hip Right Wo Contrast  03/12/2014   CLINICAL DATA:  [place] week ago. Tripped over his walker. Worsening non-radiating pain in the right hip.  EXAM: CT OF THE RIGHT HIP WITHOUT CONTRAST  TECHNIQUE: Multidetector CT imaging of the right hip was performed according to the standard protocol. Multiplanar CT image reconstructions were also generated.  COMPARISON:  02/27/2014 CT of plain films  FINDINGS: The patient has right total hip arthroplasty. There is no evidence for dislocation. The hardware appears well seated. There is No evidence for loosening or osteomyelitis. There is atherosclerotic calcification of the femoral artery. No suspicious lytic or blastic lesions are identified.  There is distention of the urinary bladder.  Patient has known fracture of the greater trochanter of the left hip, better seen on prior CT exam.  IMPRESSION: 1. Status post total hip arthroplasty. 2.  No evidence for acute  abnormality of the right hip.  3. Known fracture at the greater trochanter of the left hip. 4. Distended urinary bladder.   Electronically Signed   By: Shon Hale M.D.   On: 03/12/2014 11:16    Review of Systems  Constitutional: Negative.   HENT: Negative.   Eyes: Negative.   Respiratory: Negative.   Cardiovascular: Negative.   Musculoskeletal: Positive for back pain, joint pain and myalgias.  Skin: Negative.   Neurological: Negative.     Blood pressure 170/84, pulse 84, temperature 98 F (36.7 C), temperature source Oral, resp. rate 20, height '6\' 1"'  (1.854 m), weight 74.844 kg (165 lb),  SpO2 100.00%. Physical Exam  Constitutional: He is oriented to person, place, and time. He appears well-developed and well-nourished.  HENT:  Head: Normocephalic.  Neck: Normal range of motion.  Respiratory: Effort normal.  GI: Soft. Bowel sounds are normal.  Neurological: He is alert and oriented to person, place, and time. He has normal strength. GCS eye subscore is 4. GCS verbal subscore is 5. GCS motor subscore is 6.  Reflex Scores:      Patellar reflexes are 0 on the right side and 0 on the left side.      Achilles reflexes are 0 on the right side and 0 on the left side. Strength appears to be 5 out of 5 in his iliopsoas, quads, hip she's, gastrocs, into tibialis, EHL. Lower extremities have some ecchymoses and swelling  Skin: Skin is warm and dry.     Assessment/Plan 80 year old gentleman presents with a possible lumbar wound infection epidural abscess osteomyelitis. At 80 years old and significantly debilitated I recommended a reexploration of lumbar wound culture the epidural space irrigated and debrided any necrotic tissue and initiate antibiotics. We'll also get the triad hospitalist to help manage him medically. I have extensively gone over the risks and benefits of the operation the patient and he understands and agrees to proceed forward. I may attempt to contact both the wife and what I believe to be the son And Were Awaiting a Call Back.  Maleigha Colvard P 03/12/2014, 5:09 PM

## 2014-03-12 NOTE — ED Notes (Signed)
Report called to [facility] with [facility], ETA of 25 min.

## 2014-03-12 NOTE — Anesthesia Procedure Notes (Signed)
Procedure Name: Intubation Date/Time: 03/12/2014 6:01 PM Performed by: Jacquiline Doe A Pre-anesthesia Checklist: Patient identified, Timeout performed, Emergency Drugs available, Suction available and Patient being monitored Patient Re-evaluated:Patient Re-evaluated prior to inductionOxygen Delivery Method: Circle system utilized Preoxygenation: Pre-oxygenation with 100% oxygen Intubation Type: IV induction and Cricoid Pressure applied Ventilation: Mask ventilation without difficulty and Oral airway inserted - appropriate to patient size Laryngoscope Size: Mac and 4 Grade View: Grade I Tube type: Oral Tube size: 7.5 mm Number of attempts: 1 Airway Equipment and Method: Stylet Placement Confirmation: ETT inserted through vocal cords under direct vision,  breath sounds checked- equal and bilateral and positive ETCO2 Secured at: 23 cm Tube secured with: Tape Dental Injury: Teeth and Oropharynx as per pre-operative assessment

## 2014-03-12 NOTE — Op Note (Signed)
Preoperative diagnosis: Lumbar wound infection epidural abscess osteomyelitis status post L4-5 fusion  Postoperative diagnosis: Same  Procedure: Reexploration of lumbar wound for evacuation of lumbar epidural abscess and I&D of lumbar wound and hardware  Surgeon: Dominica Severin Kathern Lobosco  Anesthesia: Gen.  EBL: 500  History of present illness: Patient is an 80 year old gentleman who proximal me 5 weeks ago underwent decompressive laminectomy and fusion L4-5 postoperatively patient did very well initially went to the floor but then was transferred ultimately home or discharged home did well but then had a fall fractured his left hip was readmitted to [facility] underwent evaluation and was discharged to rehabilitation in rehabilitation even doing okay but again having difficulty and progressive worsening right hip and leg pain until he had a near fall that he was called for that reaggravated right hip and leg. Patient went to the ankle hemorrhage department today was worked up with an MRI scan it was very suspicious for osteomyelitis and lumbar epidural abscess. So patient was emergently transferred here and taken to the operating room.  Operative procedure: Patient brought into the or was induced under general anesthesia positioned prone the Wilson frame his back was prepped and draped in routine sterile fashion his old incision was infiltrated and opened up and over the cut was used to dissect through the scar tissue down to the residual laminotomy defect at L4-5 the scar tissues dissected off and subperiosteal dissections carried out the hardware was exposed cultures were taken the deep space prior to access the epidural space there was no overt free fluid collection appreciated in the deep space except as I was freeing up some scar tissue I did see some egressed from underneath. This was all sent for cultures. Then I freed up the scar tissue off the laminotomy defect identified a lumbar epidural space and  there was some yellowish phlegmon and fluid predominantly left of center in the laminotomy defect I sent cultures of this fluid as well as well as and cultures around the right L4 V nerve root and epidural space. I read identify the lamina age I extended the laminotomy up underneath the L4 lamina to identify the dura tracked out and freed up the L4 and L5 nerve roots probably on the right as this was his symptomatic side. I palpated the L4 and L5 nerve roots on the left may still felt to be adequately decompressed. I then irrigated the wound copiously with antibiotic irrigation as well as vancomycin powder then placed a Hemovac drain and closed the wound in layers with interrupted Vicryl and running 4 subcuticular in the skin. Dermabond benzo and Steri-Strips 4 x 4's and OpSite were then applied patient recovered in stable condition. In the case on it counts sponge counts were correct.

## 2014-03-12 NOTE — Anesthesia Preprocedure Evaluation (Addendum)
Anesthesia Evaluation  Patient identified by MRN, date of birth, ID band Patient awake    Reviewed: Allergy & Precautions, H&P , NPO status , Patient's Chart, lab work & pertinent test results  Airway Mallampati: III TM Distance: >3 FB Neck ROM: Full    Dental  (+) Dental Advisory Given   Pulmonary asthma , sleep apnea ,  breath sounds clear to auscultation        Cardiovascular hypertension, Rhythm:Regular Rate:Normal     Neuro/Psych  Neuromuscular disease    GI/Hepatic hiatal hernia, GERD-  ,  Endo/Other    Renal/GU      Musculoskeletal   Abdominal   Peds  Hematology  (+) anemia ,   Anesthesia Other Findings   Reproductive/Obstetrics                          Anesthesia Physical  Anesthesia Plan  ASA: II  Anesthesia Plan: General   Post-op Pain Management:    Induction: Intravenous  Airway Management Planned: Oral ETT  Additional Equipment:   Intra-op Plan:   Post-operative Plan: Extubation in OR  Informed Consent: I have reviewed the patients History and Physical, chart, labs and discussed the procedure including the risks, benefits and alternatives for the proposed anesthesia with the patient or authorized representative who has indicated his/her understanding and acceptance.     Plan Discussed with: CRNA, Anesthesiologist and Surgeon  Anesthesia Plan Comments:         Anesthesia Quick Evaluation

## 2014-03-12 NOTE — ED Notes (Signed)
Contact Llewellyn Choplin (wife) home [phone]                                               Cell [phone]  Jajuan Skoog (son) [phone]

## 2014-03-12 NOTE — Consult Note (Addendum)
[facility]                                                                                    Patient Demographics  Derrick Sanchez, is a 80 y.o. male  CSN: [id number]  MRN: [id number]  DOB - May 25, 1931  Admit Date - 03/12/2014  Outpatient Primary MD for the patient is [name], MD   With History of -  Past Medical History  Diagnosis Date  . Obstructive sleep apnea (adult) (pediatric)   . Unspecified asthma(493.90)   . Hyperparathyroidism, unspecified   . Other vitamin B12 deficiency anemia   . Restless legs syndrome (RLS)   . Esophageal reflux 2008    EGD  . Diverticulosis of colon (without mention of hemorrhage) 2008,2006,2003    Colonoscopy  . Glaucoma   . Internal hemorrhoids without mention of complication 7169    Colonoscopy  . Candidiasis of the esophagus 07-17-2000    EGD  . Stricture and stenosis of esophagus 05-27-2002    EGD  . Colon polyps 05-27-2002    Tubulovillous Adenoma-Colonoscopy  . Hiatal hernia 2002,2003,2013    EGD  . Allergy   . Arthritis   . Blood transfusion   . Cataract     bil eyes  . Hypertension   . Pneumonia     years ago  . Multiple falls     Pt falls asleep without warning and has encounter multiple falls      Past Surgical History  Procedure Laterality Date  . Parathyroidectomy    . Total knee arthroplasty Left   . Hip surgery Right   . Colonoscopy    . Cataract extraction Bilateral   . Mass excision  10/14/2011    Procedure: EXCISION MASS;  Surgeon: Wynonia Sours, MD;  Location: [place];  Service: Orthopedics;  Laterality: Left;   left hand  . Arm surgery    . Eye surgery Bilateral     cataracts iol    in for   Chief Complaint  Patient presents with  . Hip Pain     HPI  Derrick Sanchez  is a 80 y.o. male, with past medical history significant for obstructive sleep apnea, unspecified asthma and hyperparathyroidism status post parathyroidectomy who underwent lumbar laminectomy L4-5  around 5  weeks ago Followed by left hip fracture and transferred to rehabilitation, admitted today for lumbar epidural abscess and suspicion for osteomyelitis at the wound site the patient underwent I&D with reexploration of the lumbar wound. I saw the patient in the PACU and he was groggy. He denied any chest pains or shortness of breath. Cultures were taken and sent to microbiology   Review of Systems    Unable to obtain due to patient's condition.  Social History History  Substance Use Topics  . Smoking status: Never Smoker   . Smokeless tobacco: Never Used  . Alcohol Use: No     Family History Family History  Problem Relation Age of Onset  . Brain cancer      3 brothers  . Heart disease Father   . Heart disease Sister   . Colon cancer Neg Hx   . Esophageal  cancer Neg Hx   . Rectal cancer Neg Hx   . Stomach cancer Neg Hx   . Leukemia Brother   . Diabetes Neg Hx   . Kidney disease Neg Hx   . Liver disease Neg Hx      Prior to Admission medications   Medication Sig Start Date End Date Taking? Authorizing Provider  acetaminophen (TYLENOL) 325 MG tablet Take 2 tablets (650 mg total) by mouth every 6 (six) hours as needed for mild pain (or Fever >/= 101). 03/02/14   Delfina Redwood, MD  b complex vitamins tablet Take 1 tablet by mouth every morning.     Historical Provider, MD  bisacodyl (DULCOLAX) 10 MG suppository Place 1 suppository (10 mg total) rectally as needed for moderate constipation. 03/02/14   Delfina Redwood, MD  enoxaparin (LOVENOX) 40 MG/0.4ML injection Inject 0.4 mLs (40 mg total) into the skin daily. For 21 days 03/02/14   Delfina Redwood, MD  feeding supplement, ENSURE COMPLETE, (ENSURE COMPLETE) LIQD Take 237 mLs by mouth 2 (two) times daily between meals. 03/02/14   Delfina Redwood, MD  HYDROcodone-acetaminophen (NORCO/VICODIN) 5-325 MG per tablet Take 1-2 tablets by mouth every 4 (four) hours as needed for moderate pain or severe pain (mild pain).  03/06/14   Tiffany L Reed, DO  LORazepam (ATIVAN) 0.5 MG tablet Take one tablet by mouth three times daily as needed for anxiety 03/10/14   Blanchie Serve, MD  losartan (COZAAR) 50 MG tablet Take 50 mg by mouth at bedtime.     Historical Provider, MD  methocarbamol (ROBAXIN) 500 MG tablet Take 1 tablet (500 mg total) by mouth every 6 (six) hours as needed for muscle spasms. 03/02/14   Delfina Redwood, MD  pramipexole (MIRAPEX) 1 MG tablet Take 1-2 mg by mouth 3 (three) times daily. Take 2 mg by mouth in the morning, take 1 mg by mouth at lunch time, and take 2 mg by mouth in the evening.    Historical Provider, MD  senna-docusate (SENOKOT-S) 8.6-50 MG per tablet Take 2 tablets by mouth at bedtime. 03/02/14   Delfina Redwood, MD  sodium phosphate (FLEET) 7-19 GM/118ML ENEM Place 133 mLs (1 enema total) rectally daily as needed for severe constipation. 03/02/14   Delfina Redwood, MD    No Known Allergies  Physical Exam  Vitals  Blood pressure 147/66, pulse 67, temperature 97.5 F (36.4 C), temperature source Oral, resp. rate 18, height 6' (1.829 m), weight 74.8 kg (164 lb 14.5 oz), SpO2 100.00%.   1. General elderly white male groggy, postop  2. confused.  3. No F.N deficits grossly,   4. Ears and Eyes appear Normal, Conjunctivae clear, PERRLA. Moist Oral Mucosa.  5. Supple Neck, No JVD, No cervical lymphadenopathy appriciated, No Carotid Bruits.  6. Symmetrical Chest wall movement, Good air movement bilaterally, .  7. RRR, No Gallops, Rubs or Murmurs, No Parasternal Heave.  8. Positive Bowel Sounds, Abdomen Soft, Non tender, No organomegaly appriciated,No rebound -guarding or rigidity.  9.  No Cyanosis, Normal Skin Turgor, No Skin Rash or Bruise.  10. Good muscle tone,  joints appear normal , no effusions, Normal ROM.  11. No Palpable Lymph Nodes in Neck or Axillae    Data Review  CBC  Recent Labs Lab 03/12/14 1515  WBC 7.2  HGB 9.8*  HCT 30.7*  PLT 210  MCV  90.3  MCH 28.8  MCHC 31.9  RDW 14.0  LYMPHSABS 1.1  MONOABS 0.5  EOSABS 0.0  BASOSABS 0.0   ------------------------------------------------------------------------------------------------------------------  Chemistries   Recent Labs Lab 03/12/14 1515  NA 136*  K 3.6*  CL 102  CO2 24  GLUCOSE 97  BUN 17  CREATININE 0.73  CALCIUM 8.5  AST 21  ALT 13  ALKPHOS 98  BILITOT 0.3   ------------------------------------------------------------------------------------------------------------------ estimated creatinine clearance is 76.6 ml/min (by C-G formula based on Cr of 0.73). ------------------------------------------------------------------------------------------------------------------ No results found for this basename: TSH, T4TOTAL, FREET3, T3FREE, THYROIDAB,  in the last 72 hours   Coagulation profile  Recent Labs Lab 03/12/14 1515  INR 1.16   ------------------------------------------------------------------------------------------------------------------- No results found for this basename: DDIMER,  in the last 72 hours -------------------------------------------------------------------------------------------------------------------  Cardiac Enzymes No results found for this basename: CK, CKMB, TROPONINI, MYOGLOBIN,  in the last 168 hours ------------------------------------------------------------------------------------------------------------------ No components found with this basename: POCBNP,    ---------------------------------------------------------------------------------------------------------------    Imaging results:   Dg Lumbar Spine Complete  02/27/2014   CLINICAL DATA:  Status post fall with back and right hip pain and recent back surgery  EXAM: LUMBAR SPINE - COMPLETE 4+ VIEW  COMPARISON:  Fluoro spot films from L4-5 PLIF of February 06, 2014  FINDINGS: The metallic hardware at J0-0 is intact. The vertebral bodies are preserved in height.  There is mild disc space narrowing at multiple levels. There is stable grade 1 anterolisthesis of L4 with respect L5.  IMPRESSION: There are extensive chronic and postsurgical changes of the lumbar spine but no acute bony abnormality is demonstrated.   Electronically Signed   By: David  Martinique   On: 02/27/2014 08:23   Dg Hip Complete Right  02/27/2014   CLINICAL DATA:  Status post fall now with low back and right hip pain.  EXAM: RIGHT HIP - COMPLETE 2+ VIEW  COMPARISON:  Right femur series of today's date.  FINDINGS: The prosthetic right hip joint appears to be appropriately positioned. The interface with the native bone is unremarkable. No acute fracture of the pelvis is demonstrated. There is lucency through the base of the neck of the left femur. This could reflect overlap of soft tissue structures but a fracture is not excluded here.  IMPRESSION: 1. There is no acute bony abnormality of the you right hip and the prosthesis is intact. 2. I cannot exclude a fracture involving the base of the neck of the left femur. A left hip series is recommended unless the patient is absolutely asymptomatic.   Electronically Signed   By: David  Martinique   On: 02/27/2014 08:26   Dg Femur Right  02/27/2014   CLINICAL DATA:  Status post fall with right hip pain  EXAM: RIGHT FEMUR - 2 VIEW  COMPARISON:  Right hip series of today's date.  FINDINGS: The prosthetic right hip joint is appropriately positioned. The interface with the native bone is normal. The femoral shaft is intact. There are degenerative changes of the right knee. There is expansile remodeling of and sclerosis of the fibular head.  IMPRESSION: 1. There is no acute abnormality of the right femur. 2. There is mild to moderate degenerative change of the right knee joint. 3. Expansile remodeling of the fibular head may reflect an entity such as fibrous dysplasia but malignancy is not excluded. Further evaluation with a dedicated knee or fibular series is recommended.    Electronically Signed   By: David  Martinique   On: 02/27/2014 08:28   Ct Lumbar Spine Wo Contrast  02/27/2014   CLINICAL DATA:  Severe back pain after fall.  EXAM: CT LUMBAR SPINE WITHOUT CONTRAST  TECHNIQUE: Multidetector CT imaging of the lumbar spine was performed without intravenous contrast administration. Multiplanar CT image reconstructions were also generated.  COMPARISON:  Same day.  FINDINGS: Status post surgical posterior fusion of L4 and L5 with bilateral intrapedicular screw placement. Grade 1 anterolisthesis of L4-5 is noted. Severe degenerative disc disease is also noted at L4-5 and L5-S1 mild degenerative disc disease is noted at L1-2 and L2-3 with anterior osteophyte formation. Atherosclerotic calcifications of abdominal aorta are noted without aneurysm formation. No acute fracture is noted.  IMPRESSION: Postsurgical and degenerative changes are noted as described above. No acute abnormality seen in the lumbar spine.   Electronically Signed   By: Sabino Dick M.D.   On: 02/27/2014 11:26   Ct Pelvis Wo Contrast  02/27/2014   CLINICAL DATA:  Pain post trauma  EXAM: CT PELVIS WITHOUT CONTRAST  TECHNIQUE: Multidetector CT imaging of the pelvis was performed following the standard protocol without intravenous contrast.  COMPARISON:  CT left hip November 22, 2013  FINDINGS: There is currently a comminuted fracture of the left greater trochanter with the left greater trochanter appearing somewhat fragmented. The greater trochanter is displaced superiorly, posteriorly, and medially compared to the remainder the femur with approximately 3 cm of displacement, increased from prior study. There is a total hip prosthesis on the right which appears well seated. No other fracture is currently appreciable. No dislocation. There is mild narrowing of the left hip joint.  In the visualized lumbar spine, there is postoperative change at L4 and L5. There is marked arthropathy at L4-5 at L5-S1. There is 9 mm of  anterolisthesis of L4 on L5.  In the pelvis, there are multiple sigmoid diverticula without diverticulitis. There is no pelvic mass or fluid. There is atherosclerotic change noted in the aorta and iliac arteries. Prostate appears rather prominent with several calcifications within the prostate.  IMPRESSION: Comminuted fracture of the greater trochanter of the left femur with increased displacement and fragmentation of the greater trochanter compared to prior study.  Marked arthropathy in the visualized lumbar spine. Postoperative changes noted in this area. There is spondylolisthesis at L4-5.  Total hip replacement on the right.  Prosthesis appears well-seated.  Sigmoid diverticulosis without diverticulitis. Prostate prominent. This finding may warrant correlation with PSA.   Electronically Signed   By: Lowella Grip M.D.   On: 02/27/2014 11:28   Mr Lumbar Spine W Wo Contrast  03/12/2014   CLINICAL DATA:  Worsening severe right lower extremity pain and numbness following back surgery 02/06/2014. Question root compression.  EXAM: MRI LUMBAR SPINE WITHOUT AND WITH CONTRAST  TECHNIQUE: Multiplanar and multiecho pulse sequences of the lumbar spine were obtained without and with intravenous contrast.  CONTRAST:  22mL MULTIHANCE GADOBENATE DIMEGLUMINE 529 MG/ML IV SOLN  COMPARISON:  Lumbar spine CT 02/27/2014, radiographs 02/27/2014 and MRI 08/25/2013.  FINDINGS: The study is significantly motion degraded, despite repeating several sequences. There are postsurgical changes at L4-5 status post laminectomy and PLIF. Approximately 9 mm of anterolisthesis at the operative level is similar to the preoperative MRI and postoperative CT. However, there is extensive bone marrow edema and enhancement throughout the L4 and L5 vertebral bodies. There is endplate irregularity and enhancement within the L4-5 disc space. These findings are concerning for postoperative infection.  There is a 1.5 cm peripherally enhancing fluid  collection within the laminectomy bed. No focal epidural fluid collection is identified. However, there is mass effect on the thecal sac which is  posteriorly compressed, best seen on the postcontrast sagittal images (series 9). The AP diameter of the thecal sac is approximately 6 mm.  The alignment is otherwise normal. There are stable endplate degenerative changes at L1-2. The conus medullaris extends to the T12-L1 level. There is no definite abnormal intradural enhancement.  Multiple large renal cysts are noted.  L1-2: Stable disc bulging and endplate degeneration. No significant spinal stenosis or nerve root encroachment.  L2-3: Annular disc bulging and a broad-based left foraminal disc protrusion are grossly stable. There is mild facet and ligamentous hypertrophy. The resulting moderate spinal and left lateral recess stenosis appears unchanged.  L3-4: Mild disc bulging, facet and ligamentous hypertrophy. Mild spinal stenosis. No nerve root encroachment.  L4-5:  Detailed above.  L5-S1: Stable chronic degenerative disc disease with loss of disc height and posterior osteophytes. Mild foraminal narrowing is present bilaterally.  IMPRESSION: 1. Prominent bone marrow edema and enhancement within the L4 and L5 vertebral bodies status post recent laminectomy and PLIF. These findings are worrisome for postoperative infection with vertebral osteomyelitis. 2. Largely due to the residual anterolisthesis at the operative level, there is compression of the thecal sac. There is a small nonspecific peripherally enhancing fluid collection within the laminectomy bed. No significant epidural fluid collection identified. 3. Stable degenerative changes at the additional levels.   Electronically Signed   By: Camie Patience M.D.   On: 03/12/2014 14:18   Ct Hip Right Wo Contrast  03/12/2014   CLINICAL DATA:  [place] 1 week ago. Tripped over his walker. Worsening non-radiating pain in the right hip.  EXAM: CT OF THE RIGHT HIP WITHOUT  CONTRAST  TECHNIQUE: Multidetector CT imaging of the right hip was performed according to the standard protocol. Multiplanar CT image reconstructions were also generated.  COMPARISON:  02/27/2014 CT of plain films  FINDINGS: The patient has right total hip arthroplasty. There is no evidence for dislocation. The hardware appears well seated. There is No evidence for loosening or osteomyelitis. There is atherosclerotic calcification of the femoral artery. No suspicious lytic or blastic lesions are identified.  There is distention of the urinary bladder.  Patient has known fracture of the greater trochanter of the left hip, better seen on prior CT exam.  IMPRESSION: 1. Status post total hip arthroplasty. 2.  No evidence for acute  abnormality of the right hip. 3. Known fracture at the greater trochanter of the left hip. 4. Distended urinary bladder.   Electronically Signed   By: Shon Hale M.D.   On: 03/12/2014 11:16      Assessment & Plan   1. spinal epidural abscess status post I&D      Continue vancomycin and change Rocephin to cefepime      Consult ID in a.m.      Wound care per neurosurgery      Cultures pending       2. Obstructive sleep apnea/asthma     Duonebs     Oxygen   3. Restless leg syndrome     Continue home medications  DVT Prophylaxis SCDs, per neurosurgery  AM Labs Ordered, also please review Full Orders  Code Status full  Disposition Plan: Back to rehabilitation facility  Time spent in minutes : 34 minutes  Condition GUARDED   @SIGNATURE @

## 2014-03-12 NOTE — ED Provider Notes (Signed)
CSN: [id number]     Arrival date & time 03/12/14  7564 History  This chart was scribed for Janice Norrie, MD by Molli Posey, ED Scribe. This patient was seen in room APOTF/OTF and the patient's care was started at 8:30 AM.       Chief Complaint  Patient presents with  . Hip Pain    The history is provided by the patient and the spouse. No language interpreter was used.   HPI Comments: Derrick Sanchez is a 80 y.o. male who presents to the Emergency Department complaining of severe, worsening non radiating right hip pain that started 3 to 4 weeks ago. His wife reports he had back surgery 8/24 by Dr. Rita Ohara. He was at home after his surgery but was falling.  He had a fall on 9/14 and was admitted 9/14 for 3 days after a fall which showed a greater trochanteric fracture on the left on CT of his pelvis. He was sent to the [facility] for rehab after that hospitalization.   His wife reports that the patient fell while walking with his walker and got his foot caught in the walker and fell backwards on his back 1 week ago which patient states worsened his symptoms or may be when the right hip pain started.  He states that movement worsens the symptoms and standing or sitting up can improve his symptoms. He denies numbness to his right hip and leg. He denies any pain in his left leg or around his right knee. The patient reports using a walker since back surgery. He states any type of movement makes the pain worse. He states sitting up or staying up makes the pain feel better. Patient states he has not been able to sleep all night due to his severe pain.  The patient also complains of left leg edema with associated erythema for the past 3 to 4 months. He has seen a physician for his symptoms and has been wrapping his leg. The report the swelling is better than it was.      PCP Dr [facility] Dr Maryla Morrow  Past Medical History  Diagnosis Date  . Obstructive sleep apnea (adult) (pediatric)   .  Unspecified asthma(493.90)   . Hyperparathyroidism, unspecified   . Other vitamin B12 deficiency anemia   . Restless legs syndrome (RLS)   . Esophageal reflux 2008    EGD  . Diverticulosis of colon (without mention of hemorrhage) 2008,2006,2003    Colonoscopy  . Glaucoma   . Internal hemorrhoids without mention of complication 3329    Colonoscopy  . Candidiasis of the esophagus 07-17-2000    EGD  . Stricture and stenosis of esophagus 05-27-2002    EGD  . Colon polyps 05-27-2002    Tubulovillous Adenoma-Colonoscopy  . Hiatal hernia 2002,2003,2013    EGD  . Allergy   . Arthritis   . Blood transfusion   . Cataract     bil eyes  . Hypertension   . Pneumonia     years ago  . Multiple falls     Pt falls asleep without warning and has encounter multiple falls   Past Surgical History  Procedure Laterality Date  . Parathyroidectomy    . Total knee arthroplasty Left   . Hip surgery Right   . Colonoscopy    . Cataract extraction Bilateral   . Mass excision  10/14/2011    Procedure: EXCISION MASS;  Surgeon: Wynonia Sours, MD;  Location: [facility];  Service: Orthopedics;  Laterality: Left;   left hand  . Arm surgery    . Eye surgery Bilateral     cataracts iol   Family History  Problem Relation Age of Onset  . Brain cancer      3 brothers  . Heart disease Father   . Heart disease Sister   . Colon cancer Neg Hx   . Esophageal cancer Neg Hx   . Rectal cancer Neg Hx   . Stomach cancer Neg Hx   . Leukemia Brother   . Diabetes Neg Hx   . Kidney disease Neg Hx   . Liver disease Neg Hx    History  Substance Use Topics  . Smoking status: Never Smoker   . Smokeless tobacco: Never Used  . Alcohol Use: No  was living at home, has been at the [facility] for rehab after his back surgery Uses a walker Lives with spouse  Review of Systems  Cardiovascular: Positive for leg swelling.  Musculoskeletal: Positive for arthralgias. Negative for back pain.  Skin:  Positive for color change.  Neurological: Negative for numbness.  All other systems reviewed and are negative.     Allergies  Review of patient's allergies indicates no known allergies.  Home Medications   Prior to Admission medications   Medication Sig Start Date End Date Taking? Authorizing Provider  acetaminophen (TYLENOL) 325 MG tablet Take 2 tablets (650 mg total) by mouth every 6 (six) hours as needed for mild pain (or Fever >/= 101). 03/02/14   Delfina Redwood, MD  b complex vitamins tablet Take 1 tablet by mouth every morning.     Historical Provider, MD  bisacodyl (DULCOLAX) 10 MG suppository Place 1 suppository (10 mg total) rectally as needed for moderate constipation. 03/02/14   Delfina Redwood, MD  enoxaparin (LOVENOX) 40 MG/0.4ML injection Inject 0.4 mLs (40 mg total) into the skin daily. For 21 days 03/02/14   Delfina Redwood, MD  feeding supplement, ENSURE COMPLETE, (ENSURE COMPLETE) LIQD Take 237 mLs by mouth 2 (two) times daily between meals. 03/02/14   Delfina Redwood, MD  HYDROcodone-acetaminophen (NORCO/VICODIN) 5-325 MG per tablet Take 1-2 tablets by mouth every 4 (four) hours as needed for moderate pain or severe pain (mild pain). 03/06/14   Tiffany L Reed, DO  LORazepam (ATIVAN) 0.5 MG tablet Take one tablet by mouth three times daily as needed for anxiety 03/10/14   Blanchie Serve, MD  losartan (COZAAR) 50 MG tablet Take 50 mg by mouth at bedtime.     Historical Provider, MD  methocarbamol (ROBAXIN) 500 MG tablet Take 1 tablet (500 mg total) by mouth every 6 (six) hours as needed for muscle spasms. 03/02/14   Delfina Redwood, MD  pramipexole (MIRAPEX) 1 MG tablet Take 1-2 mg by mouth 3 (three) times daily. Take 2 mg by mouth in the morning, take 1 mg by mouth at lunch time, and take 2 mg by mouth in the evening.    Historical Provider, MD  senna-docusate (SENOKOT-S) 8.6-50 MG per tablet Take 2 tablets by mouth at bedtime. 03/02/14   Delfina Redwood, MD   sodium phosphate (FLEET) 7-19 GM/118ML ENEM Place 133 mLs (1 enema total) rectally daily as needed for severe constipation. 03/02/14   Delfina Redwood, MD   BP 182/99  Pulse 98  Temp(Src) 98.9 F (37.2 C) (Oral)  Resp 22  Ht 6\' 1"  (1.854 m)  Wt 165 lb (74.844 kg)  BMI 21.77 kg/m2  SpO2 100%  Vital signs normal   Physical Exam  Nursing note and vitals reviewed. Constitutional: He is oriented to person, place, and time.  Non-toxic appearance. He does not appear ill. He appears distressed.  Frail elderly male sitting on side of stretcher  HENT:  Head: Normocephalic and atraumatic.  Right Ear: External ear normal.  Left Ear: External ear normal.  Nose: Nose normal. No mucosal edema or rhinorrhea.  Mouth/Throat: Oropharynx is clear and moist and mucous membranes are normal. No dental abscesses or uvula swelling.  Eyes: Conjunctivae and EOM are normal. Pupils are equal, round, and reactive to light.  Neck: Normal range of motion and full passive range of motion without pain. Neck supple.  Cardiovascular: Normal rate, regular rhythm and normal heart sounds.  Exam reveals no gallop and no friction rub.   No murmur heard. Pulmonary/Chest: Effort normal and breath sounds normal. No respiratory distress. He has no wheezes. He has no rhonchi. He has no rales. He exhibits no tenderness and no crepitus.  Abdominal: Soft. Normal appearance and bowel sounds are normal. He exhibits no distension. There is no tenderness. There is no rebound and no guarding.  Musculoskeletal: Normal range of motion. He exhibits no edema and no tenderness.  Thoracic and lumbar spine are non tender. Pain along the greater trochanter of his right hip. Intact ROM but painful. Non tender over the proximal right fibular head. No joint effusion. No redness or warmth to the skin  Neurological: He is alert and oriented to person, place, and time. He has normal strength. No cranial nerve deficit.  Skin: Skin is warm, dry and  intact. No rash noted. No erythema. No pallor.  Diffuse redness and swelling of his left leg and foot with a superficial ulcer on the dorsum of his left foot. There were some stains in the dressing on the left foot. See photo Well healed midline surgical scar in his lumbar area without infection   Psychiatric: He has a normal mood and affect. His speech is normal and behavior is normal. His mood appears not anxious.         ED Course  Procedures (including critical care time)  Medications  0.9 %  sodium chloride infusion ( Intravenous Transfusing/Transfer 03/12/14 1615)  HYDROmorphone (DILAUDID) injection 1 mg (1 mg Intravenous Given 03/12/14 0858)  HYDROmorphone (DILAUDID) injection 0.5 mg (0.5 mg Intravenous Given 03/12/14 1015)  LORazepam (ATIVAN) injection 1 mg (1 mg Intravenous Given 03/12/14 1141)  HYDROmorphone (DILAUDID) injection 1 mg (1 mg Intravenous Given 03/12/14 1140)  gadobenate dimeglumine (MULTIHANCE) injection 15 mL (15 mLs Intravenous Contrast Given 03/12/14 1355)  HYDROmorphone (DILAUDID) injection 1 mg (1 mg Intravenous Given 03/12/14 1510)  HYDROmorphone (DILAUDID) injection 1 mg ( Intravenous Duplicate 8/75/64 3329)     DIAGNOSTIC STUDIES: Oxygen Saturation is 100% on RA, normal by my interpretation.    COORDINATION OF CARE: 8:46 AM Will order a CT scan of the patient's right hip and administer pain medication in the ED. The patient agreed to the treatment plan.   5:05 PM Recheck. Will order MRI for back. Will order pain medication and muscle relaxer. Patient and wife states he has been unable to have MRI in the past because he has difficulty holding still.   14:44 Dr Saintclair Halsted, will look at MRI and call me back.   14:57 Family verifies he is not on a blood thinner, and he hasn't eaten today. Dr Saintclair Halsted wants labs, and transfer to [facility] to holding area and possible  surgery tonight.    Labs Review  Results for orders placed during the hospital encounter of 03/12/14   COMPREHENSIVE METABOLIC PANEL      Result Value Ref Range   Sodium 136 (*) 137 - 147 mEq/L   Potassium 3.6 (*) 3.7 - 5.3 mEq/L   Chloride 102  96 - 112 mEq/L   CO2 24  19 - 32 mEq/L   Glucose, Bld 97  70 - 99 mg/dL   BUN 17  6 - 23 mg/dL   Creatinine, Ser 0.73  0.50 - 1.35 mg/dL   Calcium 8.5  8.4 - 10.5 mg/dL   Total Protein 7.0  6.0 - 8.3 g/dL   Albumin 2.3 (*) 3.5 - 5.2 g/dL   AST 21  0 - 37 U/L   ALT 13  0 - 53 U/L   Alkaline Phosphatase 98  39 - 117 U/L   Total Bilirubin 0.3  0.3 - 1.2 mg/dL   GFR calc non Af Amer 85 (*) >90 mL/min   GFR calc Af Amer >90  >90 mL/min   Anion gap 10  5 - 15  CBC WITH DIFFERENTIAL      Result Value Ref Range   WBC 7.2  4.0 - 10.5 K/uL   RBC 3.40 (*) 4.22 - 5.81 MIL/uL   Hemoglobin 9.8 (*) 13.0 - 17.0 g/dL   HCT 30.7 (*) 39.0 - 52.0 %   MCV 90.3  78.0 - 100.0 fL   MCH 28.8  26.0 - 34.0 pg   MCHC 31.9  30.0 - 36.0 g/dL   RDW 14.0  11.5 - 15.5 %   Platelets 210  150 - 400 K/uL   Neutrophils Relative % 77  43 - 77 %   Neutro Abs 5.5  1.7 - 7.7 K/uL   Lymphocytes Relative 15  12 - 46 %   Lymphs Abs 1.1  0.7 - 4.0 K/uL   Monocytes Relative 7  3 - 12 %   Monocytes Absolute 0.5  0.1 - 1.0 K/uL   Eosinophils Relative 1  0 - 5 %   Eosinophils Absolute 0.0  0.0 - 0.7 K/uL   Basophils Relative 0  0 - 1 %   Basophils Absolute 0.0  0.0 - 0.1 K/uL  APTT      Result Value Ref Range   aPTT 35  24 - 37 seconds  PROTIME-INR      Result Value Ref Range   Prothrombin Time 14.8  11.6 - 15.2 seconds   INR 1.16  0.00 - 1.49   Laboratory interpretation all normal except anemia, mild hyponatremia, mild hypokalemia   Imaging Review Mr Lumbar Spine W Wo Contrast  03/12/2014   CLINICAL DATA:  Worsening severe right lower extremity pain and numbness following back surgery 02/06/2014. Question root compression.  EXAM: MRI LUMBAR SPINE WITHOUT AND WITH CONTRAST  TECHNIQUE: Multiplanar and multiecho pulse sequences of the lumbar spine were obtained without  and with intravenous contrast.  CONTRAST:  33mL MULTIHANCE GADOBENATE DIMEGLUMINE 529 MG/ML IV SOLN  COMPARISON:  Lumbar spine CT 02/27/2014, radiographs 02/27/2014 and MRI 08/25/2013.  FINDINGS: The study is significantly motion degraded, despite repeating several sequences. There are postsurgical changes at L4-5 status post laminectomy and PLIF. Approximately 9 mm of anterolisthesis at the operative level is similar to the preoperative MRI and postoperative CT. However, there is extensive bone marrow edema and enhancement throughout the L4 and L5 vertebral bodies. There is endplate irregularity and enhancement within the L4-5 disc space.  These findings are concerning for postoperative infection.  There is a 1.5 cm peripherally enhancing fluid collection within the laminectomy bed. No focal epidural fluid collection is identified. However, there is mass effect on the thecal sac which is posteriorly compressed, best seen on the postcontrast sagittal images (series 9). The AP diameter of the thecal sac is approximately 6 mm.  The alignment is otherwise normal. There are stable endplate degenerative changes at L1-2. The conus medullaris extends to the T12-L1 level. There is no definite abnormal intradural enhancement.  Multiple large renal cysts are noted.  L1-2: Stable disc bulging and endplate degeneration. No significant spinal stenosis or nerve root encroachment.  L2-3: Annular disc bulging and a broad-based left foraminal disc protrusion are grossly stable. There is mild facet and ligamentous hypertrophy. The resulting moderate spinal and left lateral recess stenosis appears unchanged.  L3-4: Mild disc bulging, facet and ligamentous hypertrophy. Mild spinal stenosis. No nerve root encroachment.  L4-5:  Detailed above.  L5-S1: Stable chronic degenerative disc disease with loss of disc height and posterior osteophytes. Mild foraminal narrowing is present bilaterally.  IMPRESSION: 1. Prominent bone marrow edema and  enhancement within the L4 and L5 vertebral bodies status post recent laminectomy and PLIF. These findings are worrisome for postoperative infection with vertebral osteomyelitis. 2. Largely due to the residual anterolisthesis at the operative level, there is compression of the thecal sac. There is a small nonspecific peripherally enhancing fluid collection within the laminectomy bed. No significant epidural fluid collection identified. 3. Stable degenerative changes at the additional levels.   Electronically Signed   By: Camie Patience M.D.   On: 03/12/2014 14:18   Ct Hip Right Wo Contrast  03/12/2014   CLINICAL DATA:  [place] 1 week ago. Tripped over his walker. Worsening non-radiating pain in the right hip.  EXAM: CT OF THE RIGHT HIP WITHOUT CONTRAST  TECHNIQUE: Multidetector CT imaging of the right hip was performed according to the standard protocol. Multiplanar CT image reconstructions were also generated.  COMPARISON:  02/27/2014 CT of plain films  FINDINGS: The patient has right total hip arthroplasty. There is no evidence for dislocation. The hardware appears well seated. There is No evidence for loosening or osteomyelitis. There is atherosclerotic calcification of the femoral artery. No suspicious lytic or blastic lesions are identified.  There is distention of the urinary bladder.  Patient has known fracture of the greater trochanter of the left hip, better seen on prior CT exam.  IMPRESSION: 1. Status post total hip arthroplasty. 2.  No evidence for acute  abnormality of the right hip. 3. Known fracture at the greater trochanter of the left hip. 4. Distended urinary bladder.   Electronically Signed   By: Shon Hale M.D.   On: 03/12/2014 11:16     Dg Lumbar Spine Complete  02/27/2014   CLINICAL DATA:  Status post fall with back and right hip pain and recent back surgery  .  IMPRESSION: There are extensive chronic and postsurgical changes of the lumbar spine but no acute bony abnormality is demonstrated.    Electronically Signed   By: David  Martinique   On: 02/27/2014 08:23   Dg Hip Complete Right  02/27/2014   CLINICAL DATA:  Status post fall now with low back and right hip pain.  IMPRESSION: 1. There is no acute bony abnormality of the you right hip and the prosthesis is intact. 2. I cannot exclude a fracture involving the base of the neck of the left femur. A left  hip series is recommended unless the patient is absolutely asymptomatic.   Electronically Signed   By: David  Martinique   On: 02/27/2014 08:26   Dg Femur Right  02/27/2014   CLINICAL DATA:  Status post fall with right hip pain    IMPRESSION: 1. There is no acute abnormality of the right femur. 2. There is mild to moderate degenerative change of the right knee joint. 3. Expansile remodeling of the fibular head may reflect an entity such as fibrous dysplasia but malignancy is not excluded. Further evaluation with a dedicated knee or fibular series is recommended.   Electronically Signed   By: David  Martinique   On: 02/27/2014 08:28   Ct Lumbar Spine Wo Contrast  02/27/2014   CLINICAL DATA:  Severe back pain after fall. .  IMPRESSION: Postsurgical and degenerative changes are noted as described above. No acute abnormality seen in the lumbar spine.   Electronically Signed   By: Sabino Dick M.D.   On: 02/27/2014 11:26   Ct Pelvis Wo Contrast  02/27/2014   CLINICAL DATA:  Pain post trauma  .  IMPRESSION: Comminuted fracture of the greater trochanter of the left femur with increased displacement and fragmentation of the greater trochanter compared to prior study.  Marked arthropathy in the visualized lumbar spine. Postoperative changes noted in this area. There is spondylolisthesis at L4-5.  Total hip replacement on the right.  Prosthesis appears well-seated.  Sigmoid diverticulosis without diverticulitis. Prostate prominent. This finding may warrant correlation with PSA.   Electronically Signed   By: Lowella Grip M.D.   On: 02/27/2014 11:28       EKG Interpretation None      MDM   Final diagnoses:  Lumbar radicular pain  Post op infection    Transfer to [facility] to be evaluated by Dr Oletta Darter, MD, FACEP   I personally performed the services described in this documentation, which was scribed in my presence. The recorded information has been reviewed and considered.  Rolland Porter, MD, Abram Sander      Janice Norrie, MD 03/12/14 [phone]

## 2014-03-12 NOTE — Anesthesia Postprocedure Evaluation (Signed)
  Anesthesia Post-op Note  Patient: Derrick Sanchez  Procedure(s) Performed: Procedure(s) with comments: LUMBAR WOUND DEBRIDEMENT (N/A) - LUMBAR WOUND DEBRIDEMENT  Patient Location: PACU  Anesthesia Type:General  Level of Consciousness: awake, alert  and oriented  Airway and Oxygen Therapy: Patient Spontanous Breathing  Post-op Pain: none  Post-op Assessment: Post-op Vital signs reviewed  Post-op Vital Signs: Reviewed  Last Vitals:  Filed Vitals:   03/12/14 2000  BP: 148/66  Pulse: 72  Temp:   Resp: 22    Complications: No apparent anesthesia complications

## 2014-03-13 DIAGNOSIS — T8140XA Infection following a procedure, unspecified, initial encounter: Secondary | ICD-10-CM

## 2014-03-13 DIAGNOSIS — M7989 Other specified soft tissue disorders: Secondary | ICD-10-CM

## 2014-03-13 DIAGNOSIS — Y92009 Unspecified place in unspecified non-institutional (private) residence as the place of occurrence of the external cause: Secondary | ICD-10-CM

## 2014-03-13 DIAGNOSIS — G061 Intraspinal abscess and granuloma: Secondary | ICD-10-CM

## 2014-03-13 DIAGNOSIS — G062 Extradural and subdural abscess, unspecified: Secondary | ICD-10-CM

## 2014-03-13 DIAGNOSIS — Y849 Medical procedure, unspecified as the cause of abnormal reaction of the patient, or of later complication, without mention of misadventure at the time of the procedure: Secondary | ICD-10-CM

## 2014-03-13 DIAGNOSIS — I10 Essential (primary) hypertension: Secondary | ICD-10-CM

## 2014-03-13 DIAGNOSIS — G2581 Restless legs syndrome: Secondary | ICD-10-CM

## 2014-03-13 DIAGNOSIS — Z9181 History of falling: Secondary | ICD-10-CM

## 2014-03-13 DIAGNOSIS — M869 Osteomyelitis, unspecified: Secondary | ICD-10-CM

## 2014-03-13 MED ORDER — SODIUM CHLORIDE 0.9 % IV SOLN
300.0000 mg | INTRAVENOUS | Status: DC
  Administered 2014-03-13 – 2014-03-14 (×2): 300 mg via INTRAVENOUS
  Filled 2014-03-13 (×4): qty 300

## 2014-03-13 MED ORDER — ADULT MULTIVITAMIN W/MINERALS CH
1.0000 | ORAL_TABLET | Freq: Every day | ORAL | Status: DC
  Administered 2014-03-14 – 2014-03-16 (×3): 1 via ORAL
  Filled 2014-03-13 (×2): qty 1

## 2014-03-13 MED ORDER — ACETAMINOPHEN 650 MG RE SUPP: 650.0000 mg | RECTAL | Status: DC | PRN

## 2014-03-13 MED ORDER — ACETAMINOPHEN 325 MG PO TABS: 650.0000 mg | ORAL_TABLET | ORAL | Status: DC | PRN

## 2014-03-13 NOTE — Progress Notes (Signed)
VASCULAR LAB PRELIMINARY  PRELIMINARY  PRELIMINARY  PRELIMINARY  Left lower extremity venous duplex completed.    Preliminary report:  Left:  No evidence of DVT, superficial thrombosis, or Baker's cyst.  Derrick Sanchez, RVS 03/13/2014, 2:08 PM

## 2014-03-13 NOTE — Progress Notes (Signed)
Pt arrived to unit rm 4N12 approx 2040 hrs, A&Ox4,no obvious distress, hemovac in place and working, honeycomb and gauze dressings to back clean and dry, pt oriented to room and equipment.

## 2014-03-13 NOTE — Progress Notes (Signed)
INITIAL NUTRITION ASSESSMENT  DOCUMENTATION CODES Per approved criteria  -Severe malnutrition in the context of acute illness or injury  Pt meets criteria for SEVERE MALNUTRITION in the context of ACUTE ILLNESS/INJURY as evidenced by moderate muscle wasting, moderate loss of subcutaneous fat, estimated energy intake <50% of estimated needs for > 5 days, and +2 edema.  INTERVENTION: Provide Snacks TID Encourage PO intake Provide Magic Cup ice cream with lunch and dinner Provide Multivitamin with minerals daily  Discontinue Ensure Complete supplements  NUTRITION DIAGNOSIS: Inadequate oral intake related to poor appetite due to pain as evidenced by pt's report of eating 50% less for the past few weeks.   Goal: Pt to meet >/= 90% of their estimated nutrition needs   Monitor:  PO intake, weight trend, labs  Reason for Assessment: Malnutrition Screening Tool, score of 4  80 y.o. male  Admitting Dx: <principal problem not specified>  ASSESSMENT: 80 year old male with past medical history of OSA, asthma, hyperparathyroidism status post parathyroidectomy, status post lumbar decompressive laminectomy L4-5 about 5 weeks PTA. Pt had a fall about 1 and a half week prior to this admission. Pt presented to [facility] with progressive right hip pain. Imaging studies included right hip CT scan which was negative for acute fracture. Ultimately MRI lumbar spine showed prominent bone marrow edema and enhancement within the L4 and L5 vertebral bodies status post recent laminectomy and PLIF, findings worrisome for postoperative infection with vertebral osteomyelitis. Pt was transferred to [facility] for further management. Pt underwent reexploration of lumbar wound with evacuation of lumbar epidural abscess and I&D of lumbar wound and hardware. Pt is on broad spectrum antibiotic coverage, vanco and cefepime.  Pt falling asleep during RD assessment; pt's wife at bedside assisted pt in providing history. Pt reports that  for the past few weeks he has had a very poor appetite due to pain and he has been eating 50% less than usual. He reports that one year ago he weighed 180 lbs and has slowly been losing weight. Pt was given Ensure during previous admission but, per pt's wife he does not like it and doesn't tolerate it. Pt's wife reports pt usually snacks several times throughout the day. RD to provide pt snacks while admitted. RD emphasized the importance of getting adequate nutrition to promote wound healing. RD encouraged snacking, intake of high protein foods, and drinking beverages with calories/protein.  Unable to complete full physical exam due to patient falling asleep. Pt with some obvious moderate muscle wasting of temples and clavicles and moderate fat wasting in arms.  Labs reviewed. Low sodium, low potassium, low hemoglobin, low albumin  Height: Ht Readings from Last 1 Encounters:  03/12/14 6' (1.829 m)    Weight: Wt Readings from Last 1 Encounters:  03/12/14 164 lb 14.5 oz (74.8 kg)    Ideal Body Weight: 178 lbs  % Ideal Body Weight: 92%  Wt Readings from Last 10 Encounters:  03/12/14 164 lb 14.5 oz (74.8 kg)  03/12/14 164 lb 14.5 oz (74.8 kg)  03/02/14 151 lb 14.4 oz (68.9 kg)  02/06/14 155 lb 3 oz (70.393 kg)  02/06/14 155 lb 3 oz (70.393 kg)  02/01/14 155 lb 3.3 oz (70.4 kg)  07/14/13 171 lb 12.8 oz (77.928 kg)  02/02/12 178 lb (80.74 kg)  01/30/12 180 lb 1.6 oz (81.693 kg)  01/21/12 178 lb (80.74 kg)    Usual Body Weight: 180 lb  % Usual Body Weight: 91%  BMI:  Body mass index is  22.36 kg/(m^2).  Estimated Nutritional Needs: Kcal: 4268-3419 Protein: 90-100 grams Fluid: 1.8- 2 L/day  Skin: +1 RLE edema, +2 LLE edema; closed back incision with hemovac  Diet Order: General  EDUCATION NEEDS: -No education needs identified at this time   Intake/Output Summary (Last 24 hours) at 03/13/14 1046 Last data filed at 03/13/14 0533  Gross per 24 hour  Intake    850 ml  Output     450 ml  Net    400 ml    Last BM: PTA  Labs:   Recent Labs Lab 03/12/14 1515  NA 136*  K 3.6*  CL 102  CO2 24  BUN 17  CREATININE 0.73  CALCIUM 8.5  GLUCOSE 97    CBG (last 3)  No results found for this basename: GLUCAP,  in the last 72 hours  Scheduled Meds: . ceFEPime (MAXIPIME) IV  1 g Intravenous 3 times per day  . feeding supplement (ENSURE COMPLETE)  237 mL Oral BID BM  . losartan  50 mg Oral QHS  . pramipexole  2 mg Oral q morning - 10a   And  . pramipexole  1 mg Oral Q1200   And  . pramipexole  2 mg Oral QHS  . senna-docusate  2 tablet Oral QHS  . sodium chloride  3 mL Intravenous Q12H  . vancomycin  1,000 mg Intravenous Q12H    Continuous Infusions: . 0.45 % NaCl with KCl 20 mEq / L    . sodium chloride      Past Medical History  Diagnosis Date  . Obstructive sleep apnea (adult) (pediatric)   . Unspecified asthma(493.90)   . Hyperparathyroidism, unspecified   . Other vitamin B12 deficiency anemia   . Restless legs syndrome (RLS)   . Esophageal reflux 2008    EGD  . Diverticulosis of colon (without mention of hemorrhage) 2008,2006,2003    Colonoscopy  . Glaucoma   . Internal hemorrhoids without mention of complication 6222    Colonoscopy  . Candidiasis of the esophagus 07-17-2000    EGD  . Stricture and stenosis of esophagus 05-27-2002    EGD  . Colon polyps 05-27-2002    Tubulovillous Adenoma-Colonoscopy  . Hiatal hernia 2002,2003,2013    EGD  . Allergy   . Arthritis   . Blood transfusion   . Cataract     bil eyes  . Hypertension   . Pneumonia     years ago  . Multiple falls     Pt falls asleep without warning and has encounter multiple falls    Past Surgical History  Procedure Laterality Date  . Parathyroidectomy    . Total knee arthroplasty Left   . Hip surgery Right   . Colonoscopy    . Cataract extraction Bilateral   . Mass excision  10/14/2011    Procedure: EXCISION MASS;  Surgeon: Wynonia Sours, MD;  Location: [facility];  Service: Orthopedics;  Laterality: Left;   left hand  . Arm surgery    . Eye surgery Bilateral     cataracts iol    Pryor Ochoa RD, LDN Inpatient Clinical Dietitian Pager: [phone] After Hours Pager: [phone]

## 2014-03-13 NOTE — Progress Notes (Signed)
UR complete.  Amadou Katzenstein RN, MSN 

## 2014-03-13 NOTE — Consult Note (Addendum)
[facility] PROGRESS NOTE  WOOD NOVACEK WUJ:[id number] DOB: 25-Sep-1930 DOA: 03/12/2014 PCP: [name], MD  Brief narrative: 80 year old male with past medical history of OSA, asthma, hyperparathyroidism status post parathyroidectomy, status post lumbar decompressive laminectomy L4-5 about 5 weeks PTA. Pt had a fall about 1 and a half week prior to this admission. Pt presented to [facility] ED with progressive right hip pain. Imaging studies included right hip CT scan which was negative for acute fracture. Ultimately MRI lumbar spine showed prominent bone marrow edema and enhancement within the L4 and L5 vertebral bodies status post recent laminectomy and PLIF, findings worrisome for postoperative infection with vertebral osteomyelitis. Pt was transferred to [facility] for further management. Pt underwent reexploration of lumbar wound with evacuation of lumbar epidural abscess and I&D of lumbar wound and hardware. Pt is on broad spectrum antibiotic coverage, vanco and cefepime.  Assessment and Plan:    Principal Problem: Spinal epidural abscess  Status post reexploration of lumbar wound for evacuation of lumbar epidural abscess and I&D of lumbar wound and hardware.  Started vancomycin and cefepime. Follow up wound caulutre results, so far all show no growth to date. Appreciate ID consult and recommendations.   Continue pain management efforts.   Active Problems: Hypertension  Continue losartan 50 mg daily Restless leg syndrome  Continue pramipexole  Hypokalemia  Unclear etiology; being repleted through IV fluids  Left lower extremity swelling  With chronic superimposed venous stasis changes.  Obtain LLE doppler to rule out DVT. Protein calorie malnutrition, severe  Continue nutritional supplements    DVT prophylaxis  SCD' s bilaterally   Code Status: Full Family Communication: No family at the bedside  Disposition Plan: PT once able to participate    IV Access:   Peripheral  IV Procedures and diagnostic studies:   Mr Lumbar Spine W Wo Contrast 03/12/2014  1. Prominent bone marrow edema and enhancement within the L4 and L5 vertebral bodies status post recent laminectomy and PLIF. These findings are worrisome for postoperative infection with vertebral osteomyelitis. 2. Largely due to the residual anterolisthesis at the operative level, there is compression of the thecal sac. There is a small nonspecific peripherally enhancing fluid collection within the laminectomy bed. No significant epidural fluid collection identified. 3. Stable degenerative changes at the additional levels.   Ct Hip Right Wo Contrast 03/12/2014   1. Status post total hip arthroplasty. 2.  No evidence for acute  abnormality of the right hip. 3. Known fracture at the greater trochanter of the left hip. 4. Distended urinary bladder.  Medical Consultants:   Infectious disease  Other Consultants:   Physical therapy  Anti-Infectives:   Vancomycin 03/12/2014 -- _ Rocephin 03/12/2014 --> 03/12/2014 Cefepime 03/12/2014 -->  Faye Ramsay, MD  [facility] Pager [phone]  If 7PM-7AM, please contact night-coverage [email or web address] Password [email or web address] 03/13/2014, 10:08 AM   LOS: 1 day   HPI/Subjective: No events overnight.   Objective: Filed Vitals:   03/12/14 2020 03/12/14 2035 03/13/14 0147 03/13/14 0532  BP: 128/70 147/66 103/58 114/54  Pulse: 78 67 72 67  Temp: 97.8 F (36.6 C) 97.5 F (36.4 C) 98.1 F (36.7 C) 98.1 F (36.7 C)  TempSrc:  Oral Oral Oral  Resp: 29 18 18 18   Height:  6' (1.829 m)    Weight:  74.8 kg (164 lb 14.5 oz)    SpO2: 98% 100% 98% 100%    Intake/Output Summary (Last 24 hours) at 03/13/14 1008 Last data filed at 03/13/14 0533  Gross per 24  hour  Intake    850 ml  Output    450 ml  Net    400 ml    Exam:   General:  Pt is alert, follows commands appropriately, not in acute distress  Cardiovascular: Regular rate and rhythm, S1/S2 appreciated   Respiratory: Clear to  auscultation bilaterally, no wheezing, no crackles, no rhonchi  Abdomen: Soft, non tender, non distended, bowel sounds present, no guarding  Extremities: Left lower extremity swelling with erythema and chronic venous changes, pulses DP and PT palpable bilaterally  Neuro: Grossly nonfocal  Data Reviewed: Basic Metabolic Panel:  Recent Labs Lab 03/12/14 1515  NA 136*  K 3.6*  CL 102  CO2 24  GLUCOSE 97  BUN 17  CREATININE 0.73  CALCIUM 8.5   Liver Function Tests:  Recent Labs Lab 03/12/14 1515  AST 21  ALT 13  ALKPHOS 98  BILITOT 0.3  PROT 7.0  ALBUMIN 2.3*   No results found for this basename: LIPASE, AMYLASE,  in the last 168 hours No results found for this basename: AMMONIA,  in the last 168 hours CBC:  Recent Labs Lab 03/12/14 1515  WBC 7.2  NEUTROABS 5.5  HGB 9.8*  HCT 30.7*  MCV 90.3  PLT 210   Cardiac Enzymes: No results found for this basename: CKTOTAL, CKMB, CKMBINDEX, TROPONINI,  in the last 168 hours BNP: No components found with this basename: POCBNP,  CBG: No results found for this basename: GLUCAP,  in the last 168 hours  ANAEROBIC CULTURE     Status: None   Collection Time    03/12/14  6:25 PM      Result Value Ref Range Status   Specimen Description WOUND   Final   Special Requests DEEP SPACE LUMBAR WOUND   Final   Gram Stain     Final   Value: RARE WBC PRESENT,BOTH PMN AND MONONUCLEAR     NO ORGANISMS SEEN     Performed at [facility]   Culture PENDING   Incomplete   Report Status PENDING   Incomplete  WOUND CULTURE     Status: None   Collection Time    03/12/14  6:25 PM      Result Value Ref Range Status   Specimen Description WOUND BACK   Final   Special Requests DEEP SPACE LUMBAR WOUND   Final   Value: NO GROWTH     Performed at [facility]   Report Status PENDING   Incomplete  WOUND CULTURE     Status: None   Collection Time    03/12/14  6:43 PM      Result Value Ref Range Status   Specimen  Description WOUND   Final   Special Requests EPIDURAL FLUID LUMBAR WOUND SWAB NO 2   Final   Value: NO GROWTH     Performed at [facility]   Report Status PENDING   Incomplete  ANAEROBIC CULTURE     Status: None   Collection Time    03/12/14  6:43 PM      Result Value Ref Range Status   Specimen Description WOUND   Final   Special Requests EPIDURAL FLUID LUMBAR NO 2   Final     NO ORGANISMS SEEN   Culture PENDING   Incomplete   Report Status PENDING   Incomplete  WOUND CULTURE     Status: None   Collection Time    03/12/14  6:50 PM  Result Value Ref Range Status   Specimen Description WOUND   Final   Special Requests EPIDURAL FLUID LUMBAR WOUND SWAB NO 3   Final     NO ORGANISMS SEEN   Value: NO GROWTH     Performed at [facility]   Report Status PENDING   Incomplete  ANAEROBIC CULTURE     Status: None   Collection Time    03/12/14  6:50 PM      Result Value Ref Range Status   Specimen Description WOUND   Final   Special Requests EPIDURAL FLUID LUMBAR WOUND NO 3   Final   Gram Stain     Final   Value: RARE WBC PRESENT, PREDOMINANTLY PMN     NO ORGANISMS SEEN   Culture PENDING   Incomplete   Report Status PENDING   Incomplete     Scheduled Meds: . ceFEPime (MAXIPIME) IV  1 g Intravenous 3 times per day  . feeding supplement   237 mL Oral BID BM  . losartan  50 mg Oral QHS  . pramipexole  2 mg Oral q morning - 10a   And  . pramipexole  1 mg Oral Q1200   And  . pramipexole  2 mg Oral QHS  . senna-docusate  2 tablet Oral QHS  . vancomycin  1,000 mg Intravenous Q12H   Continuous Infusions: . 0.45 % NaCl with KCl 20 mEq / L    . sodium chloride 1,000 mL (03/12/14 2246)  . sodium chloride

## 2014-03-13 NOTE — Evaluation (Signed)
Physical Therapy Evaluation Patient Details Name: Derrick Sanchez MRN: [id number] DOB: 07-14-1930 Today's Date: 03/13/2014   History of Present Illness  80 year old gentleman who is one-month status post decompressive laminectomy and fusion who had a fall about a week and a half ago underwent workup with CT scan of his lumbar spine which showed no abnormality of his hardware or his fusion site has been complaining of hip pain for the last week and this got progressively worse in the last couple days. Patient was presented to the [facility] emergency room complaining of a pain. MRI scan of his lumbar spine that was highly suggestive of postoperative infection epidural abscess and osteomyelitis. Pt s/p lumbar wound for evacuation of lumbar epidural abscess and I&D of lumbar wound and hardware.   Clinical Impression  Patient is s/p above surgery resulting in the deficits listed below (see PT Problem List). Patient will benefit from skilled PT to increase their independence and safety with mobility (while adhering to their precautions) to allow discharge to next appropriate venue. Pt wife cannot provide (A) needed at this time. Will recommend SNF for post acute rehab. Pt and wife to benefit from [place] for consult; pt does not want to D/C back to [facility] center.     Follow Up Recommendations SNF;Supervision for mobility/OOB    Equipment Recommendations  None recommended by PT    Recommendations for Other Services OT consult     Precautions / Restrictions Precautions Precautions: Back;Fall Precaution Booklet Issued: Yes (comment) Precaution Comments: pt given handout and reviewed precautions; recent fall ~2 weeks ago Required Braces or Orthoses: Spinal Brace Spinal Brace: Lumbar corset;Applied in sitting position Restrictions Weight Bearing Restrictions: Yes LLE Weight Bearing: Weight bearing as tolerated      Mobility  Bed Mobility Overal bed mobility: Needs Assistance Bed Mobility:  Rolling;Sidelying to Sit Rolling: Min assist Sidelying to sit: Min assist       General bed mobility comments: cues for log rolling technique and min (A) to elevate trunk to sitting position   Transfers Overall transfer level: Needs assistance Equipment used: Rolling walker (2 wheeled) Transfers: Sit to/from [facility] Sit to Stand: Mod assist;From elevated surface Stand pivot transfers: Mod assist;From elevated surface       General transfer comment: pt with difficulty elevating trunk to standing position due to generalized weaknes and pain; max cues for hand placement and mod (A) to balance and perform pivotal steps to chair   Ambulation/Gait             General Gait Details: recommend 2 person (A) for safety with gt  Stairs            Wheelchair Mobility    Modified Rankin (Stroke Patients Only)       Balance Overall balance assessment: Needs assistance;History of Falls Sitting-balance support: Feet supported;Single extremity supported Sitting balance-Leahy Scale: Poor Sitting balance - Comments: bracing with UE support   Standing balance support: During functional activity;Bilateral upper extremity supported Standing balance-Leahy Scale: Zero Standing balance comment: required (A) and RW for balance at all times                             Pertinent Vitals/Pain Pain Assessment: 0-10 Pain Score: 8  Pain Location: "throughout my back"  Pain Descriptors / Indicators: Aching;Constant Pain Intervention(s): Monitored during session;Premedicated before session;Repositioned    Home Living Family/patient expects to be discharged to:: Skilled nursing facility Living Arrangements: Spouse/significant  other Available Help at Discharge: Family Type of Home: House Home Access: Stairs to enter Entrance Stairs-Rails: Right Entrance Stairs-Number of Steps: 2 Home Layout: Two level;Able to live on main level with bedroom/bathroom Home  Equipment: Gilford Rile - 2 wheels;Electric scooter;Crutches;Shower seat;Bedside commode Additional Comments: pt from [facility] center but does not want to return to that specific rehab center    Prior Function Level of Independence: Needs assistance   Gait / Transfers Assistance Needed: ambulating with RW and (A) at [place] / [place] Needed: (A) required at [facility]  Comments: prior to admission; pt was independent with all ADLs and mobility     Hand Dominance        Extremity/Trunk Assessment   Upper Extremity Assessment: Defer to OT evaluation           Lower Extremity Assessment: Generalized weakness RLE Deficits / Details: Rt quad 3/5; hip 3-/5    Cervical / Trunk Assessment: Kyphotic  Communication   Communication: No difficulties  Cognition Arousal/Alertness: Awake/alert Behavior During Therapy: WFL for tasks assessed/performed Overall Cognitive Status: Within Functional Limits for tasks assessed       Memory: Decreased recall of precautions;Decreased short-term memory              General Comments      Exercises        Assessment/Plan    PT Assessment Patient needs continued PT services  PT Diagnosis Difficulty walking;Acute pain;Generalized weakness   PT Problem List Decreased strength;Decreased activity tolerance;Decreased balance;Pain;Decreased mobility;Decreased safety awareness;Decreased knowledge of precautions  PT Treatment Interventions Gait training;Functional mobility training;Therapeutic activities;Patient/family education;Therapeutic exercise   PT Goals (Current goals can be found in the Care Plan section) Acute Rehab PT Goals Patient Stated Goal: to get better already PT Goal Formulation: With patient Time For Goal Achievement: 03/14/14 Potential to Achieve Goals: Good    Frequency Min 5X/week   Barriers to discharge Decreased caregiver support      Co-evaluation               End of Session  Equipment Utilized During Treatment: Gait belt;Back brace Activity Tolerance: Patient tolerated treatment well Patient left: in chair;with call bell/phone within reach;with chair alarm set;with family/visitor present Nurse Communication: Mobility status         Time: 4462-8638 PT Time Calculation (min): 22 min   Charges:   PT Evaluation $Initial PT Evaluation Tier I: 1 Procedure PT Treatments $Therapeutic Activity: 8-22 mins   PT G CodesGustavus Bryant, [place] 03/13/2014, 10:25 AM

## 2014-03-13 NOTE — Evaluation (Signed)
Occupational Therapy Evaluation Patient Details Name: Derrick Sanchez MRN: [id number] DOB: 03-04-1931 Today's Date: 03/13/2014    History of Present Illness 80 yo readmission due to s/p fall x2 ( bathroom / off couch) found to have epidural abscess. PT now s/p I&D by Dr Saintclair Halsted. Pt with PLIF L4-5 on 02/06/14 by [name]   Clinical Impression   Patient is s/p I&D PLIF L4-5 abscess surgery resulting in functional limitations due to the deficits listed below (see OT problem list). PTA pt was at [facility] rehab. Pt does not wish to return to this SNF Patient will benefit from skilled OT acutely to increase independence and safety with ADLS to allow discharge SNF. Ot to follow acutely for dynamic standing balance with adls retraining.      Follow Up Recommendations  SNF;Supervision/Assistance - 24 hour    Equipment Recommendations  Other (comment) (defer SNF)    Recommendations for Other Services       Precautions / Restrictions Precautions Precautions: Back;Fall Precaution Comments: poor recall of precautions Required Braces or Orthoses: Spinal Brace Spinal Brace: Lumbar corset;Applied in sitting position Restrictions Weight Bearing Restrictions: Yes LLE Weight Bearing: Weight bearing as tolerated      Mobility Bed Mobility Overal bed mobility: Needs Assistance Bed Mobility: Rolling;Sidelying to Sit;Supine to Sit Rolling: Min assist Sidelying to sit: Min assist Supine to sit: Min assist     General bed mobility comments: cues for sequence and safety  Transfers Overall transfer level: Needs assistance Equipment used: Rolling walker (2 wheeled) Transfers: Sit to/from Stand Sit to Stand: Min assist         General transfer comment: cues for hand placement and to progress to EOB to help with sit<>Stand. pt responding well to cues to take big steps with incr gait velocity    Balance Overall balance assessment: Needs assistance Sitting-balance support: Bilateral upper  extremity supported;Feet supported Sitting balance-Leahy Scale: Fair   Postural control: Posterior lean Standing balance support: Bilateral upper extremity supported;During functional activity Standing balance-Leahy Scale: Zero Standing balance comment: requries bil UE                             ADL Overall ADL's : Needs assistance/impaired     Grooming: Wash/dry hands;Moderate assistance;Standing Grooming Details (indicate cue type and reason): pt unable to release sink. pt required leaning against sink and single UE support at all times. Pt reports "you can see I am scared I am going to fall again"         Upper Body Dressing : Moderate assistance;Sitting Upper Body Dressing Details (indicate cue type and reason): pt required cues to don aspen brace correctly  Lower Body Dressing: Maximal assistance;Sit to/from stand   Toilet Transfer: Minimal assistance;RW;[employer] Armed forces technical officer Details (indicate cue type and reason): cus for hand placement Toileting- Clothing Manipulation and Hygiene: Maximal assistance       Functional mobility during ADLs: Minimal assistance;Rolling walker General ADL Comments: Pt requires cues for safety with back precautions. pt searching brace for yellow mark correctly  Pt however misaligning brace, unable to locate the pull strings and unaware of error. Pt completed toilet transfer and sink level grooming. pt with decr gait velocity and at times pausing with decr motor planning. RN reports pt with parkinson medication however not noted in PAst medical history at this time.      Vision  Perception     Praxis      Pertinent Vitals/Pain Pain Assessment: 0-10 Pain Score: 8  Pain Location: back Pain Intervention(s): Repositioned;Premedicated before session (start session at 5 and with activity increase to 8)     Hand Dominance     Extremity/Trunk Assessment Upper Extremity Assessment Upper Extremity  Assessment: Generalized weakness   Lower Extremity Assessment Lower Extremity Assessment: Defer to PT evaluation   Cervical / Trunk Assessment Cervical / Trunk Assessment: Kyphotic   Communication Communication Communication: No difficulties   Cognition Arousal/Alertness: Awake/alert Behavior During Therapy: WFL for tasks assessed/performed Overall Cognitive Status: Impaired/Different from baseline Area of Impairment: Memory     Memory: Decreased recall of precautions         General Comments: pt demonstrates executive functioning deficits   General Comments       Exercises       Shoulder Instructions      Home Living Family/patient expects to be discharged to:: Skilled nursing facility Living Arrangements: Spouse/significant other Available Help at Discharge: Family Type of Home: House Home Access: Stairs to enter [employer] of Steps: 2 Entrance Stairs-Rails: Right Home Layout: Two level;Able to live on main level with bedroom/bathroom   Alternate Level Stairs-Rails: Can reach both;Left;Right           Home Equipment: Walker - 2 wheels;Electric scooter;Crutches;Shower seat;Bedside commode   Additional Comments: reports that [facility] was a "joke" because they did nothing for him there. Pt reports limited therapy and therapist workign on his memory. pt states "i have alot of things wrong with me but my memory isnt one of them"      Prior Functioning/Environment Level of Independence: Needs assistance  Gait / Transfers Assistance Needed: ambulating with RW and (A) at [place] [facility] [place] Needed: (A) required at [facility]   Comments: prior to admission; pt was independent with all ADLs and mobility    OT Diagnosis: Generalized weakness;Cognitive deficits   OT Problem List: Decreased strength;Decreased activity tolerance;Impaired balance (sitting and/or standing);Decreased safety awareness;Decreased cognition;Decreased  knowledge of use of DME or AE;Decreased knowledge of precautions;Pain   OT Treatment/Interventions: Self-care/ADL training;Therapeutic exercise;Neuromuscular education;DME and/or AE instruction;Cognitive remediation/compensation;Therapeutic activities;Patient/family education;Balance training    OT Goals(Current goals can be found in the care plan section) Acute Rehab OT Goals Patient Stated Goal: to return home OT Goal Formulation: With patient Time For Goal Achievement: 03/27/14 Potential to Achieve Goals: Good  OT Frequency: Min 2X/week   Barriers to D/C:            Co-evaluation              End of Session Equipment Utilized During Treatment: Gait belt;Rolling walker Nurse Communication: Mobility status;Precautions  Activity Tolerance: Patient tolerated treatment well Patient left: in bed;with call bell/phone within reach;with bed alarm set;with family/visitor present   Time: 1443-1540 OT Time Calculation (min): 23 min Charges:  OT General Charges $OT Visit: 1 Procedure OT Evaluation $Initial OT Evaluation Tier I: 1 Procedure OT Treatments $Self Care/Home Management : 8-22 mins G-Codes:    Peri Maris 03-21-14, 2:53 PM Pager: [phone]

## 2014-03-13 NOTE — Consult Note (Signed)
[name]  Date of Admission:  03/12/2014  Date of Consult:  03/13/2014  Reason for Consult: Wound infection Referring Physician: Saintclair Halsted  Impression/Recommendation Post-operative infection Epidural Abscess Osteomyelitis LLE swelling  Continue his current anbx Add rifampin Await Cx Place PIC  Comment- Will ad rifampin due to presence of prosthetic.  Hopefully his Cx will give [place] the ability to pare down his anbx.  Discussed use of rifampin with pt.  Discussed PIC placement with pt.   Thank you so much for this interesting consult,   [name] (pager) [phone] [email or web address]  Derrick Sanchez is an 80 y.o. male.  HPI: 80 yo M with hx of L4-5 decompression with prosthesis on 02-06-14. He did well post-op and was d/c on 8-26.  His course was further complicated by a fall and L greater trochanter fracture 9-14. He was d/c home on 9-17 with medical mgmt.  He returns on 9-27 with worsening back and MRI that was highly suggestive of post-operative infection, osteomyelitis, and epidural abscess.  He was afebrile and had WBC that was normal. He had ESR 56. CRP 5. 2.  He underwent debridement/re-exploration 9-27. He was started on vancomycin and ceftriaxone post-operatively, ceftriaxone then changed to cefepime.   Past Medical History  Diagnosis Date  . Obstructive sleep apnea (adult) (pediatric)   . Unspecified asthma(493.90)   . Hyperparathyroidism, unspecified   . Other vitamin B12 deficiency anemia   . Restless legs syndrome (RLS)   . Esophageal reflux 2008    EGD  . Diverticulosis of colon (without mention of hemorrhage) 2008,2006,2003    Colonoscopy  . Glaucoma   . Internal hemorrhoids without mention of complication 7867    Colonoscopy  . Candidiasis of the esophagus 07-17-2000    EGD  . Stricture and stenosis of esophagus 05-27-2002    EGD  . Colon polyps 05-27-2002    Tubulovillous Adenoma-Colonoscopy  . Hiatal hernia  2002,2003,2013    EGD  . Allergy   . Arthritis   . Blood transfusion   . Cataract     bil eyes  . Hypertension   . Pneumonia     years ago  . Multiple falls     Pt falls asleep without warning and has encounter multiple falls    Past Surgical History  Procedure Laterality Date  . Parathyroidectomy    . Total knee arthroplasty Left   . Hip surgery Right   . Colonoscopy    . Cataract extraction Bilateral   . Mass excision  10/14/2011    Procedure: EXCISION MASS;  Surgeon: Wynonia Sours, MD;  Location: [facility];  Service: Orthopedics;  Laterality: Left;   left hand  . Arm surgery    . Eye surgery Bilateral     cataracts iol     No Known Allergies  Medications:  Scheduled: . ceFEPime (MAXIPIME) IV  1 g Intravenous 3 times per day  . losartan  50 mg Oral QHS  . multivitamin with minerals  1 tablet Oral Daily  . pramipexole  2 mg Oral q morning - 10a   And  . pramipexole  1 mg Oral Q1200   And  . pramipexole  2 mg Oral QHS  . senna-docusate  2 tablet Oral QHS  . sodium chloride  3 mL Intravenous Q12H  . vancomycin  1,000 mg Intravenous Q12H    Abtx:  Anti-infectives   Start     Dose/Rate Route Frequency Ordered Stop  03/13/14 1800  cefTRIAXone (ROCEPHIN) 2 g in dextrose 5 % 50 mL IVPB  Status:  Discontinued     2 g 100 mL/hr over 30 Minutes Intravenous Every 24 hours 03/12/14 2043 03/12/14 2058   03/13/14 0400  vancomycin (VANCOCIN) IVPB 1000 mg/200 mL premix     1,000 mg 200 mL/hr over 60 Minutes Intravenous Every 12 hours 03/12/14 2237     03/12/14 2245  ceFEPIme (MAXIPIME) 1 g in dextrose 5 % 50 mL IVPB     1 g 100 mL/hr over 30 Minutes Intravenous 3 times per day 03/12/14 2237     03/12/14 1858  vancomycin (VANCOCIN) powder  Status:  Discontinued       As needed 03/12/14 1858 03/12/14 1937   03/12/14 1832  bacitracin 50,000 Units in sodium chloride irrigation 0.9 % 500 mL irrigation  Status:  Discontinued       As needed 03/12/14 1833 03/12/14  1937   03/12/14 1830  vancomycin (VANCOCIN) 1,000 mg in sodium chloride 0.9 % 250 mL IVPB     1,000 mg 250 mL/hr over 60 Minutes Intravenous  Once 03/12/14 1812 03/12/14 1925   03/12/14 1815  cefTRIAXone (ROCEPHIN) 2 g in dextrose 5 % 50 mL IVPB     2 g 100 mL/hr over 30 Minutes Intravenous  Once 03/12/14 1812 03/12/14 1855      Total days of antibiotics 2 (vanco/cefepime)          Social History:  reports that he has never smoked. He has never used smokeless tobacco. He reports that he does not drink alcohol or use illicit drugs.  Family History  Problem Relation Age of Onset  . Brain cancer      3 brothers  . Heart disease Father   . Heart disease Sister   . Colon cancer Neg Hx   . Esophageal cancer Neg Hx   . Rectal cancer Neg Hx   . Stomach cancer Neg Hx   . Leukemia Brother   . Diabetes Neg Hx   . Kidney disease Neg Hx   . Liver disease Neg Hx     General ROS: decreased appetite, no f/c, normal urination, normal BM, no paresthesias, no hx of wound dehisc, see HPI.   Blood pressure 121/59, pulse 83, temperature 98.2 F (36.8 C), temperature source Oral, resp. rate 20, height 6' (1.829 m), weight 74.8 kg (164 lb 14.5 oz), SpO2 96.00%. General appearance: alert, cooperative and no distress Eyes: negative findings: conjunctivae and sclerae normal and pupils equal, round, reactive to light and accomodation Throat: lips, mucosa, and tongue normal; teeth and gums normal Neck: no adenopathy and supple, symmetrical, trachea midline Back: drain in place. non-tender. clean.  Lungs: clear to auscultation bilaterally Heart: regular rate and rhythm Abdomen: normal findings: bowel sounds normal and soft, non-tender Extremities: LLE swollen, erythematous. mild tenderness with palpation of calf.    Results for orders placed during the hospital encounter of 03/12/14 (from the past 48 hour(s))  COMPREHENSIVE METABOLIC PANEL     Status: Abnormal   Collection Time    03/12/14  3:15  PM      Result Value Ref Range   Sodium 136 (*) 137 - 147 mEq/L   Potassium 3.6 (*) 3.7 - 5.3 mEq/L   Chloride 102  96 - 112 mEq/L   CO2 24  19 - 32 mEq/L   Glucose, Bld 97  70 - 99 mg/dL   BUN 17  6 - 23 mg/dL   Creatinine,  Ser 0.73  0.50 - 1.35 mg/dL   Calcium 8.5  8.4 - 10.5 mg/dL   Total Protein 7.0  6.0 - 8.3 g/dL   Albumin 2.3 (*) 3.5 - 5.2 g/dL   AST 21  0 - 37 U/L   ALT 13  0 - 53 U/L   Alkaline Phosphatase 98  39 - 117 U/L   Total Bilirubin 0.3  0.3 - 1.2 mg/dL   GFR calc non Af Amer 85 (*) >90 mL/min   GFR calc Af Amer >90  >90 mL/min   Comment: (NOTE)     The eGFR has been calculated using the CKD EPI equation.     This calculation has not been validated in all clinical situations.     eGFR's persistently <90 mL/min signify possible Chronic Kidney     Disease.   Anion gap 10  5 - 15  CBC WITH DIFFERENTIAL     Status: Abnormal   Collection Time    03/12/14  3:15 PM      Result Value Ref Range   WBC 7.2  4.0 - 10.5 K/uL   RBC 3.40 (*) 4.22 - 5.81 MIL/uL   Hemoglobin 9.8 (*) 13.0 - 17.0 g/dL   HCT 30.7 (*) 39.0 - 52.0 %   MCV 90.3  78.0 - 100.0 fL   MCH 28.8  26.0 - 34.0 pg   MCHC 31.9  30.0 - 36.0 g/dL   RDW 14.0  11.5 - 15.5 %   Platelets 210  150 - 400 K/uL   Neutrophils Relative % 77  43 - 77 %   Neutro Abs 5.5  1.7 - 7.7 K/uL   Lymphocytes Relative 15  12 - 46 %   Lymphs Abs 1.1  0.7 - 4.0 K/uL   Monocytes Relative 7  3 - 12 %   Monocytes Absolute 0.5  0.1 - 1.0 K/uL   Eosinophils Relative 1  0 - 5 %   Eosinophils Absolute 0.0  0.0 - 0.7 K/uL   Basophils Relative 0  0 - 1 %   Basophils Absolute 0.0  0.0 - 0.1 K/uL  SEDIMENTATION RATE     Status: Abnormal   Collection Time    03/12/14  3:15 PM      Result Value Ref Range   Sed Rate 56 (*) 0 - 16 mm/hr  C-REACTIVE PROTEIN     Status: Abnormal   Collection Time    03/12/14  3:15 PM      Result Value Ref Range   CRP 5.2 (*) <0.60 mg/dL   Comment: Performed at [facility]  APTT     Status:  None   Collection Time    03/12/14  3:15 PM      Result Value Ref Range   aPTT 35  24 - 37 seconds  PROTIME-INR     Status: None   Collection Time    03/12/14  3:15 PM      Result Value Ref Range   Prothrombin Time 14.8  11.6 - 15.2 seconds   INR 1.16  0.00 - 1.49      Component Value Date/Time   SDES WOUND 03/12/2014 1850   SDES WOUND 03/12/2014 1850   SPECREQUEST EPIDURAL FLUID LUMBAR WOUND SWAB NO 3 03/12/2014 1850   SPECREQUEST EPIDURAL FLUID LUMBAR WOUND NO 3 03/12/2014 1850   CULT  Value: NO GROWTH Performed at [facility] 03/12/2014 1850   CULT  Value: NO ANAEROBES ISOLATED; CULTURE IN PROGRESS FOR  5 DAYS Performed at [facility] 03/12/2014 1850   REPTSTATUS PENDING 03/12/2014 1850   REPTSTATUS PENDING 03/12/2014 1850   Mr Lumbar Spine W Wo Contrast  03/12/2014   CLINICAL DATA:  Worsening severe right lower extremity pain and numbness following back surgery 02/06/2014. Question root compression.  EXAM: MRI LUMBAR SPINE WITHOUT AND WITH CONTRAST  TECHNIQUE: Multiplanar and multiecho pulse sequences of the lumbar spine were obtained without and with intravenous contrast.  CONTRAST:  79m MULTIHANCE GADOBENATE DIMEGLUMINE 529 MG/ML IV SOLN  COMPARISON:  Lumbar spine CT 02/27/2014, radiographs 02/27/2014 and MRI 08/25/2013.  FINDINGS: The study is significantly motion degraded, despite repeating several sequences. There are postsurgical changes at L4-5 status post laminectomy and PLIF. Approximately 9 mm of anterolisthesis at the operative level is similar to the preoperative MRI and postoperative CT. However, there is extensive bone marrow edema and enhancement throughout the L4 and L5 vertebral bodies. There is endplate irregularity and enhancement within the L4-5 disc space. These findings are concerning for postoperative infection.  There is a 1.5 cm peripherally enhancing fluid collection within the laminectomy bed. No focal epidural fluid collection is identified. However,  there is mass effect on the thecal sac which is posteriorly compressed, best seen on the postcontrast sagittal images (series 9). The AP diameter of the thecal sac is approximately 6 mm.  The alignment is otherwise normal. There are stable endplate degenerative changes at L1-2. The conus medullaris extends to the T12-L1 level. There is no definite abnormal intradural enhancement.  Multiple large renal cysts are noted.  L1-2: Stable disc bulging and endplate degeneration. No significant spinal stenosis or nerve root encroachment.  L2-3: Annular disc bulging and a broad-based left foraminal disc protrusion are grossly stable. There is mild facet and ligamentous hypertrophy. The resulting moderate spinal and left lateral recess stenosis appears unchanged.  L3-4: Mild disc bulging, facet and ligamentous hypertrophy. Mild spinal stenosis. No nerve root encroachment.  L4-5:  Detailed above.  L5-S1: Stable chronic degenerative disc disease with loss of disc height and posterior osteophytes. Mild foraminal narrowing is present bilaterally.  IMPRESSION: 1. Prominent bone marrow edema and enhancement within the L4 and L5 vertebral bodies status post recent laminectomy and PLIF. These findings are worrisome for postoperative infection with vertebral osteomyelitis. 2. Largely due to the residual anterolisthesis at the operative level, there is compression of the thecal sac. There is a small nonspecific peripherally enhancing fluid collection within the laminectomy bed. No significant epidural fluid collection identified. 3. Stable degenerative changes at the additional levels.   Electronically Signed   By: BCamie PatienceM.D.   On: 03/12/2014 14:18   Ct Hip Right Wo Contrast  03/12/2014   CLINICAL DATA:  [place]. Tripped over his walker. Worsening non-radiating pain in the right hip.  EXAM: CT OF THE RIGHT HIP WITHOUT CONTRAST  TECHNIQUE: Multidetector CT imaging of the right hip was performed according to the standard  protocol. Multiplanar CT image reconstructions were also generated.  COMPARISON:  02/27/2014 CT of plain films  FINDINGS: The patient has right total hip arthroplasty. There is no evidence for dislocation. The hardware appears well seated. There is No evidence for loosening or osteomyelitis. There is atherosclerotic calcification of the femoral artery. No suspicious lytic or blastic lesions are identified.  There is distention of the urinary bladder.  Patient has known fracture of the greater trochanter of the left hip, better seen on prior CT exam.  IMPRESSION: 1. Status post total hip arthroplasty.  2.  No evidence for acute  abnormality of the right hip. 3. Known fracture at the greater trochanter of the left hip. 4. Distended urinary bladder.   Electronically Signed   By: Shon Hale M.D.   On: 03/12/2014 11:16   Recent Results (from the past 240 hour(s))  ANAEROBIC CULTURE     Status: None   Collection Time    03/12/14  6:25 PM      Result Value Ref Range Status   Specimen Description WOUND   Final   Special Requests DEEP SPACE LUMBAR WOUND   Final   Gram Stain     Final   Value: RARE WBC PRESENT,BOTH PMN AND MONONUCLEAR     NO ORGANISMS SEEN     Performed at [facility]   Culture     Final   Value: NO ANAEROBES ISOLATED; CULTURE IN PROGRESS FOR 5 DAYS     Performed at [facility]   Report Status PENDING   Incomplete  WOUND CULTURE     Status: None   Collection Time    03/12/14  6:25 PM      Result Value Ref Range Status   Specimen Description WOUND BACK   Final   Special Requests DEEP SPACE LUMBAR WOUND   Final   Gram Stain     Final   Value: RARE WBC PRESENT,BOTH PMN AND MONONUCLEAR     NO ORGANISMS SEEN     Performed at [facility]   Culture     Final   Value: NO GROWTH     Performed at [facility]   Report Status PENDING   Incomplete  WOUND CULTURE     Status: None   Collection Time    03/12/14  6:43 PM      Result Value Ref Range Status     Specimen Description WOUND   Final   Special Requests EPIDURAL FLUID LUMBAR WOUND SWAB NO 2   Final   Gram Stain     Final   Value: RARE WBC PRESENT, PREDOMINANTLY PMN     NO ORGANISMS SEEN     Performed at [facility]   Culture     Final   Value: NO GROWTH     Performed at [facility]   Report Status PENDING   Incomplete  ANAEROBIC CULTURE     Status: None   Collection Time    03/12/14  6:43 PM      Result Value Ref Range Status   Specimen Description WOUND   Final   Special Requests EPIDURAL FLUID LUMBAR NO 2   Final   Gram Stain     Final   Value: RARE WBC PRESENT, PREDOMINANTLY PMN     NO ORGANISMS SEEN     Performed at [facility]   Culture     Final   Value: NO ANAEROBES ISOLATED; CULTURE IN PROGRESS FOR 5 DAYS     Performed at [facility]   Report Status PENDING   Incomplete  WOUND CULTURE     Status: None   Collection Time    03/12/14  6:50 PM      Result Value Ref Range Status   Specimen Description WOUND   Final   Special Requests EPIDURAL FLUID LUMBAR WOUND SWAB NO 3   Final   Gram Stain     Final   Value: RARE WBC PRESENT, PREDOMINANTLY PMN     NO ORGANISMS SEEN  Performed at [facility]     Final   Value: NO GROWTH     Performed at [facility]   Report Status PENDING   Incomplete  ANAEROBIC CULTURE     Status: None   Collection Time    03/12/14  6:50 PM      Result Value Ref Range Status   Specimen Description WOUND   Final   Special Requests EPIDURAL FLUID LUMBAR WOUND NO 3   Final   Gram Stain     Final   Value: RARE WBC PRESENT, PREDOMINANTLY PMN     NO ORGANISMS SEEN     Performed at [facility]   Culture     Final   Value: NO ANAEROBES ISOLATED; CULTURE IN PROGRESS FOR 5 DAYS     Performed at [facility]   Report Status PENDING   Incomplete      03/13/2014, 5:20 PM     LOS: 1 day

## 2014-03-13 NOTE — Progress Notes (Signed)
Subjective: Patient reports feeling much better significant proven right hip and leg pain gets and soreness around the incision.  Objective: Vital signs in last 24 hours: Temp:  [97.5 F (36.4 C)-98.1 F (36.7 C)] 98.1 F (36.7 C) (09/28 0532) Pulse Rate:  [67-95] 67 (09/28 0532) Resp:  [14-29] 18 (09/28 0532) BP: (103-184)/(54-89) 114/54 mmHg (09/28 0532) SpO2:  [76 %-100 %] 100 % (09/28 0532) Weight:  [74.8 kg (164 lb 14.5 oz)] 74.8 kg (164 lb 14.5 oz) (09/27 2035)  Intake/Output from previous day: 09/27 0701 - 09/28 0700 In: 850 [I.V.:850] Out: 450 [Urine:200; Drains:100; Blood:150] Intake/Output this shift:    strength out of 5 wound clean dry and intact  Lab Results:  Recent Labs  03/12/14 1515  WBC 7.2  HGB 9.8*  HCT 30.7*  PLT 210   BMET  Recent Labs  03/12/14 1515  NA 136*  K 3.6*  CL 102  CO2 24  GLUCOSE 97  BUN 17  CREATININE 0.73  CALCIUM 8.5    Studies/Results: Mr Lumbar Spine W Wo Contrast  03/12/2014   CLINICAL DATA:  Worsening severe right lower extremity pain and numbness following back surgery 02/06/2014. Question root compression.  EXAM: MRI LUMBAR SPINE WITHOUT AND WITH CONTRAST  TECHNIQUE: Multiplanar and multiecho pulse sequences of the lumbar spine were obtained without and with intravenous contrast.  CONTRAST:  37mL MULTIHANCE GADOBENATE DIMEGLUMINE 529 MG/ML IV SOLN  COMPARISON:  Lumbar spine CT 02/27/2014, radiographs 02/27/2014 and MRI 08/25/2013.  FINDINGS: The study is significantly motion degraded, despite repeating several sequences. There are postsurgical changes at L4-5 status post laminectomy and PLIF. Approximately 9 mm of anterolisthesis at the operative level is similar to the preoperative MRI and postoperative CT. However, there is extensive bone marrow edema and enhancement throughout the L4 and L5 vertebral bodies. There is endplate irregularity and enhancement within the L4-5 disc space. These findings are concerning for  postoperative infection.  There is a 1.5 cm peripherally enhancing fluid collection within the laminectomy bed. No focal epidural fluid collection is identified. However, there is mass effect on the thecal sac which is posteriorly compressed, best seen on the postcontrast sagittal images (series 9). The AP diameter of the thecal sac is approximately 6 mm.  The alignment is otherwise normal. There are stable endplate degenerative changes at L1-2. The conus medullaris extends to the T12-L1 level. There is no definite abnormal intradural enhancement.  Multiple large renal cysts are noted.  L1-2: Stable disc bulging and endplate degeneration. No significant spinal stenosis or nerve root encroachment.  L2-3: Annular disc bulging and a broad-based left foraminal disc protrusion are grossly stable. There is mild facet and ligamentous hypertrophy. The resulting moderate spinal and left lateral recess stenosis appears unchanged.  L3-4: Mild disc bulging, facet and ligamentous hypertrophy. Mild spinal stenosis. No nerve root encroachment.  L4-5:  Detailed above.  L5-S1: Stable chronic degenerative disc disease with loss of disc height and posterior osteophytes. Mild foraminal narrowing is present bilaterally.  IMPRESSION: 1. Prominent bone marrow edema and enhancement within the L4 and L5 vertebral bodies status post recent laminectomy and PLIF. These findings are worrisome for postoperative infection with vertebral osteomyelitis. 2. Largely due to the residual anterolisthesis at the operative level, there is compression of the thecal sac. There is a small nonspecific peripherally enhancing fluid collection within the laminectomy bed. No significant epidural fluid collection identified. 3. Stable degenerative changes at the additional levels.   Electronically Signed   By: Modesta Messing.D.  On: 03/12/2014 14:18   Ct Hip Right Wo Contrast  03/12/2014   CLINICAL DATA:  [place] 1 week ago. Tripped over his walker. Worsening  non-radiating pain in the right hip.  EXAM: CT OF THE RIGHT HIP WITHOUT CONTRAST  TECHNIQUE: Multidetector CT imaging of the right hip was performed according to the standard protocol. Multiplanar CT image reconstructions were also generated.  COMPARISON:  02/27/2014 CT of plain films  FINDINGS: The patient has right total hip arthroplasty. There is no evidence for dislocation. The hardware appears well seated. There is No evidence for loosening or osteomyelitis. There is atherosclerotic calcification of the femoral artery. No suspicious lytic or blastic lesions are identified.  There is distention of the urinary bladder.  Patient has known fracture of the greater trochanter of the left hip, better seen on prior CT exam.  IMPRESSION: 1. Status post total hip arthroplasty. 2.  No evidence for acute  abnormality of the right hip. 3. Known fracture at the greater trochanter of the left hip. 4. Distended urinary bladder.   Electronically Signed   By: Shon Hale M.D.   On: 03/12/2014 11:16    Assessment/Plan: Progressive mobilization with physical and occupational therapy will consult infectious disease and wait on culture results   LOS: 1 day     Analayah Brooke P 03/13/2014, 8:18 AM

## 2014-03-14 ENCOUNTER — Encounter (HOSPITAL_COMMUNITY): Payer: Self-pay | Admitting: Neurosurgery

## 2014-03-14 LAB — BASIC METABOLIC PANEL
Anion gap: 12 (ref 5–15)
BUN: 16 mg/dL (ref 6–23)
CO2: 22 meq/L (ref 19–32)
CREATININE: 0.81 mg/dL (ref 0.50–1.35)
Calcium: 8.2 mg/dL — ABNORMAL LOW (ref 8.4–10.5)
Chloride: 105 mEq/L (ref 96–112)
GFR calc Af Amer: >90 mL/min (ref >90)
GFR calc non Af Amer: 81 mL/min — ABNORMAL LOW (ref >90)
GLUCOSE: 91 mg/dL (ref 70–99)
Potassium: 3.7 mEq/L (ref 3.7–5.3)
Sodium: 139 mEq/L (ref 137–147)

## 2014-03-14 LAB — CBC
HEMATOCRIT: 26.3 % — AB (ref 39.0–52.0)
HEMOGLOBIN: 8.3 g/dL — AB (ref 13.0–17.0)
MCH: 28.2 pg (ref 26.0–34.0)
MCHC: 31.6 g/dL (ref 30.0–36.0)
MCV: 89.5 fL (ref 78.0–100.0)
Platelets: 209 10*3/uL (ref 150–400)
RBC: 2.94 MIL/uL — ABNORMAL LOW (ref 4.22–5.81)
RDW: 14.3 % (ref 11.5–15.5)
WBC: 5.1 10*3/uL (ref 4.0–10.5)

## 2014-03-14 MED ORDER — SODIUM CHLORIDE 0.9 % IJ SOLN
10.0000 mL | INTRAMUSCULAR | Status: DC | PRN
  Administered 2014-03-15: 10 mL

## 2014-03-14 NOTE — Clinical Social Work Psychosocial (Signed)
Clinical Social Work Department BRIEF PSYCHOSOCIAL ASSESSMENT 03/14/2014  Patient:  [facility], [facility]     Account Number:  [id number]     Admit date:  03/12/2014  Clinical Social Worker:  Delrae Sawyers  Date/Time:  03/14/2014 11:02 AM  Referred by:  Physician  Date Referred:  03/14/2014 Referred for  [facility] Pl[facility]   Other Referral:   none.   Interview type:  Patient Other interview type:   Pt granted CSW permission to speak with pt's son regarding discharge disposition.    PSYCHOSOCIAL DATA Living Status:  ALONE Admitted from facility:   Level of care:   Primary support name:  [facility] Primary support relationship to patient:  CHILD, ADULT Degree of support available:   Strong support system.    CURRENT CONCERNS Current Concerns  Post-Acute Placement   Other Concerns:   none.    SOCIAL WORK ASSESSMENT / PLAN CSW consulted regarding [facility] placement at time of discharge. CSW received call from admissions liaison with [facility] stating pt's son requesting placement at [facility].    CSW met with pt at bedside to discuss discharge disposition. Pt states he has previously completed short-term rehabilitation at [facility] and would like to return once medically stable for discharge. Pt granted CSW permission to speak with pt's son regarding discharge disposition.    CSW to continue to follow and assist with discharge planning needs.   Assessment/plan status:  Psychosocial Support/Ongoing Assessment of Needs Other assessment/ plan:   none.   Information/referral to community resources:   Pt to discharge to [facility] once medically stable for discharge.    PATIENT'S/FAMILY'S RESPONSE TO PLAN OF CARE: Pt understanding and agreeable to CSW plan of care. Pt expressed no further questions or concerns at this time.       [facility], [place] [phone]) Licensed Clinical Social Worker Neuroscience [phone]) and  Medical ICU (69M)

## 2014-03-14 NOTE — Progress Notes (Signed)
INFECTIOUS DISEASE PROGRESS NOTE  ID: [facility] is a 80 y.o. male with  Active Problems:   Spinal epidural abscess  Subjective: Without complaints. Got PIC  Abtx:  Anti-infectives   Start     Dose/Rate Route Frequency Ordered Stop   03/13/14 1830  rifampin (RIFADIN) 300 mg in sodium chloride 0.9 % 100 mL IVPB     300 mg 200 mL/hr over 30 Minutes Intravenous Every 24 hours 03/13/14 1744     03/13/14 1800  cefTRIAXone (ROCEPHIN) 2 g in dextrose 5 % 50 mL IVPB  Status:  Discontinued     2 g 100 mL/hr over 30 Minutes Intravenous Every 24 hours 03/12/14 2043 03/12/14 2058   03/13/14 0400  vancomycin (VANCOCIN) IVPB 1000 mg/200 mL premix     1,000 mg 200 mL/hr over 60 Minutes Intravenous Every 12 hours 03/12/14 2237     03/12/14 2245  ceFEPIme (MAXIPIME) 1 g in dextrose 5 % 50 mL IVPB     1 g 100 mL/hr over 30 Minutes Intravenous 3 times per day 03/12/14 2237     03/12/14 1858  vancomycin (VANCOCIN) powder  Status:  Discontinued       As needed 03/12/14 1858 03/12/14 1937   03/12/14 1832  bacitracin 50,000 Units in sodium chloride irrigation 0.9 % 500 mL irrigation  Status:  Discontinued       As needed 03/12/14 1833 03/12/14 1937   03/12/14 1830  vancomycin (VANCOCIN) 1,000 mg in sodium chloride 0.9 % 250 mL IVPB     1,000 mg 250 mL/hr over 60 Minutes Intravenous  Once 03/12/14 1812 03/12/14 1925   03/12/14 1815  cefTRIAXone (ROCEPHIN) 2 g in dextrose 5 % 50 mL IVPB     2 g 100 mL/hr over 30 Minutes Intravenous  Once 03/12/14 1812 03/12/14 1855      Medications:  Scheduled: . ceFEPime (MAXIPIME) IV  1 g Intravenous 3 times per day  . losartan  50 mg Oral QHS  . multivitamin with minerals  1 tablet Oral Daily  . pramipexole  2 mg Oral q morning - 10a   And  . pramipexole  1 mg Oral Q1200   And  . pramipexole  2 mg Oral QHS  . rifampin (RIFADIN) IVPB  300 mg Intravenous Q24H  . senna-docusate  2 tablet Oral QHS  . sodium chloride  3 mL Intravenous Q12H  .  vancomycin  1,000 mg Intravenous Q12H    Objective: Vital signs in last 24 hours: Temp:  [97.8 F (36.6 C)-98.2 F (36.8 C)] 98 F (36.7 C) (09/29 0952) Pulse Rate:  [72-80] 72 (09/29 0952) Resp:  [20-22] 20 (09/29 0952) BP: (135-148)/(57-73) 148/68 mmHg (09/29 0952) SpO2:  [95 %-99 %] 98 % (09/29 0952)   General appearance: alert, cooperative and no distress Resp: clear to auscultation bilaterally Cardio: regular rate and rhythm GI: normal findings: bowel sounds normal and soft, non-tender  Lab Results  Recent Labs  03/12/14 1515 03/14/14 0740  WBC 7.2 5.1  HGB 9.8* 8.3*  HCT 30.7* 26.3*  NA 136* 139  K 3.6* 3.7  CL 102 105  CO2 24 22  BUN 17 16  CREATININE 0.73 0.81   Liver Panel  Recent Labs  03/12/14 1515  PROT 7.0  ALBUMIN 2.3*  AST 21  ALT 13  ALKPHOS 98  BILITOT 0.3   Sedimentation Rate  Recent Labs  03/12/14 1515  ESRSEDRATE 56*   C-Reactive Protein  Recent Labs  03/12/14 1515  CRP 5.2*    Microbiology: Recent Results (from the past 240 hour(s))  ANAEROBIC CULTURE     Status: None   Collection Time    03/12/14  6:25 PM      Result Value Ref Range Status   Specimen Description WOUND   Final   Special Requests DEEP SPACE LUMBAR WOUND   Final   Gram Stain     Final   Value: RARE WBC PRESENT,BOTH PMN AND MONONUCLEAR     NO ORGANISMS SEEN     Performed at [facility]   Culture     Final   Value: NO ANAEROBES ISOLATED; CULTURE IN PROGRESS FOR 5 DAYS     Performed at [facility]   Report Status PENDING   Incomplete  WOUND CULTURE     Status: None   Collection Time    03/12/14  6:25 PM      Result Value Ref Range Status   Specimen Description WOUND BACK   Final   Special Requests DEEP SPACE LUMBAR WOUND   Final   Gram Stain     Final   Value: RARE WBC PRESENT,BOTH PMN AND MONONUCLEAR     NO ORGANISMS SEEN     Performed at [facility]   Culture     Final   Value: NO GROWTH 2 DAYS     Performed at  [facility]   Report Status PENDING   Incomplete  WOUND CULTURE     Status: None   Collection Time    03/12/14  6:43 PM      Result Value Ref Range Status   Specimen Description WOUND   Final   Special Requests EPIDURAL FLUID LUMBAR WOUND SWAB NO 2   Final   Gram Stain     Final   Value: RARE WBC PRESENT, PREDOMINANTLY PMN     NO ORGANISMS SEEN     Performed at [facility]   Culture     Final   Value: NO GROWTH 2 DAYS     Performed at [facility]   Report Status PENDING   Incomplete  ANAEROBIC CULTURE     Status: None   Collection Time    03/12/14  6:43 PM      Result Value Ref Range Status   Specimen Description WOUND   Final   Special Requests EPIDURAL FLUID LUMBAR NO 2   Final   Gram Stain     Final   Value: RARE WBC PRESENT, PREDOMINANTLY PMN     NO ORGANISMS SEEN     Performed at [facility]   Culture     Final   Value: NO ANAEROBES ISOLATED; CULTURE IN PROGRESS FOR 5 DAYS     Performed at [facility]   Report Status PENDING   Incomplete  WOUND CULTURE     Status: None   Collection Time    03/12/14  6:50 PM      Result Value Ref Range Status   Specimen Description WOUND   Final   Special Requests EPIDURAL FLUID LUMBAR WOUND SWAB NO 3   Final   Gram Stain     Final   Value: RARE WBC PRESENT, PREDOMINANTLY PMN     NO ORGANISMS SEEN     Performed at [facility]   Culture     Final   Value: NO GROWTH 2 DAYS     Performed at [facility]   Report Status  PENDING   Incomplete  ANAEROBIC CULTURE     Status: None   Collection Time    03/12/14  6:50 PM      Result Value Ref Range Status   Specimen Description WOUND   Final   Special Requests EPIDURAL FLUID LUMBAR WOUND NO 3   Final   Gram Stain     Final   Value: RARE WBC PRESENT, PREDOMINANTLY PMN     NO ORGANISMS SEEN     Performed at [facility]   Culture     Final   Value: NO ANAEROBES ISOLATED; CULTURE IN PROGRESS FOR 5 DAYS     Performed at  [facility]   Report Status PENDING   Incomplete    Studies/Results: No results found.   Assessment/Plan: Post-operative infection, presence of hardware Epidural Abscess  Osteomyelitis  LLE swelling  Total days of antibiotics: 2 cefepime/vanco/rifampin Would continue his current anbx while awaiting the final of his Cx.           [name] Infectious Diseases (pager) [phone] [email or web address] 03/14/2014, 3:02 PM  LOS: 2 days

## 2014-03-14 NOTE — Consult Note (Signed)
[facility] PROGRESS NOTE  [facility] WJX:[id number] DOB: 1930/07/08 DOA: 03/12/2014 PCP: [name], MD  Brief narrative: 80 year old male with past medical history of OSA, asthma, hyperparathyroidism status post parathyroidectomy, status post lumbar decompressive laminectomy L4-5 about 5 weeks PTA. Pt had a fall about 1 and a half week prior to this admission. Pt presented to [facility] ED with progressive right hip pain. Imaging studies included right hip CT scan which was negative for acute fracture. Ultimately MRI lumbar spine showed prominent bone marrow edema and enhancement within the L4 and L5 vertebral bodies status post recent laminectomy and PLIF, findings worrisome for postoperative infection with vertebral osteomyelitis. Pt was transferred to [facility] for further management. Pt underwent reexploration of lumbar wound with evacuation of lumbar epidural abscess and I&D of lumbar wound and hardware. Pt is on broad spectrum antibiotic coverage, vanco and cefepime.  Assessment and Plan:    Principal Problem: Spinal epidural abscess  Status post reexploration of lumbar wound for evacuation of lumbar epidural abscess and I&D of lumbar wound and hardware.  Started vancomycin and cefepime. Results are still negative at 2 days Appreciate ID consult and recommendations-may be able to narrow. Midline has been placed 9/29  Continue pain management Percocet 1 to 2q4 when necessary. Discontinue Dilaudid  Active Problems: Hypertension  Continue losartan 50 mg daily  Saline lock IV Restless leg syndrome  Continue pramipexole 2 mg a.m., 2 mg p.m. Hypokalemia  Unclear etiology; being repleted through IV fluids   Repeat labs in a.m. Left lower extremity swelling  With chronic superimposed venous stasis changes.   not DVT and ultrasound 9/20 was negative Protein calorie malnutrition, severe  Continue nutritional supplements  Anemia  Unclear etiology-baseline seems to be 9-10  range  Would guaiac stools as an outpatient DVT prophylaxis  SCD' s bilaterally   Code Status: Full Family Communication: No family at the bedside  Disposition Plan: PT once able to participate    IV Access:   Peripheral IV Procedures and diagnostic studies:   Mr Lumbar Spine W Wo Contrast 03/12/2014  1. Prominent bone marrow edema and enhancement within the L4 and L5 vertebral bodies status post recent laminectomy and PLIF. These findings are worrisome for postoperative infection with vertebral osteomyelitis. 2. Largely due to the residual anterolisthesis at the operative level, there is compression of the thecal sac. There is a small nonspecific peripherally enhancing fluid collection within the laminectomy bed. No significant epidural fluid collection identified. 3. Stable degenerative changes at the additional levels.   Ct Hip Right Wo Contrast 03/12/2014   1. Status post total hip arthroplasty. 2.  No evidence for acute  abnormality of the right hip. 3. Known fracture at the greater trochanter of the left hip. 4. Distended urinary bladder.  Medical Consultants:   Infectious disease  Other Consultants:   Physical therapy  Anti-Infectives:   Vancomycin 03/12/2014 --> Rocephin 03/12/2014 --> 03/12/2014 Cefepime 03/12/2014 --> Rifampicin 9/28-->  Verneita Griffes, MD [facility] [phone]  03/14/2014, 11:25 AM   LOS: 2 days   HPI/Subjective:  Doing fair. He rates pain as minimal. Ambulated just now. Tolerating diet No other complaints   Objective: Filed Vitals:   03/13/14 2124 03/14/14 0124 03/14/14 0610 03/14/14 0952  BP: 135/57 135/61 147/73 148/68  Pulse: 73 72 77 72  Temp: 98.2 F (36.8 C) 98 F (36.7 C) 97.8 F (36.6 C) 98 F (36.7 C)  TempSrc: Oral Oral Oral Oral  Resp: 20  22 20   Height:  Weight:      SpO2: 98% 95% 99% 98%    Intake/Output Summary (Last 24 hours) at 03/14/14 1125 Last data filed at 03/14/14 2703  Gross per 24 hour  Intake      0  ml  Output    700 ml  Net   -700 ml    Exam:   General:  Pt is alert, follows commands appropriately, not in acute distress  Cardiovascular: Regular rate and rhythm, S1/S2 appreciated   Respiratory: Clear to auscultation bilaterally, no wheezing, no crackles, no rhonchi  Abdomen: Soft, non tender, non distended, bowel sounds present, no guarding  Extremities: Left lower extremity swelling with erythema and chronic venous changes, pulses DP and PT palpable bilaterally  Neuro: Grossly nonfocal  Data Reviewed: Basic Metabolic Panel:  Recent Labs Lab 03/12/14 1515 03/14/14 0740  NA 136* 139  K 3.6* 3.7  CL 102 105  CO2 24 22  GLUCOSE 97 91  BUN 17 16  CREATININE 0.73 0.81  CALCIUM 8.5 8.2*   Liver Function Tests:  Recent Labs Lab 03/12/14 1515  AST 21  ALT 13  ALKPHOS 98  BILITOT 0.3  PROT 7.0  ALBUMIN 2.3*   No results found for this basename: LIPASE, AMYLASE,  in the last 168 hours No results found for this basename: AMMONIA,  in the last 168 hours CBC:  Recent Labs Lab 03/12/14 1515 03/14/14 0740  WBC 7.2 5.1  NEUTROABS 5.5  --   HGB 9.8* 8.3*  HCT 30.7* 26.3*  MCV 90.3 89.5  PLT 210 209   Cardiac Enzymes: No results found for this basename: CKTOTAL, CKMB, CKMBINDEX, TROPONINI,  in the last 168 hours BNP: No components found with this basename: POCBNP,  CBG: No results found for this basename: GLUCAP,  in the last 168 hours  ANAEROBIC CULTURE     Status: None   Collection Time    03/12/14  6:25 PM      Result Value Ref Range Status   Specimen Description WOUND   Final   Special Requests DEEP SPACE LUMBAR WOUND   Final   Gram Stain     Final   Value: RARE WBC PRESENT,BOTH PMN AND MONONUCLEAR     NO ORGANISMS SEEN     Performed at [facility]   Culture PENDING   Incomplete   Report Status PENDING   Incomplete  WOUND CULTURE     Status: None   Collection Time    03/12/14  6:25 PM      Result Value Ref Range Status   Specimen  Description WOUND BACK   Final   Special Requests DEEP SPACE LUMBAR WOUND   Final   Value: NO GROWTH     Performed at [facility]   Report Status PENDING   Incomplete  WOUND CULTURE     Status: None   Collection Time    03/12/14  6:43 PM      Result Value Ref Range Status   Specimen Description WOUND   Final   Special Requests EPIDURAL FLUID LUMBAR WOUND SWAB NO 2   Final   Value: NO GROWTH     Performed at [facility]   Report Status PENDING   Incomplete  ANAEROBIC CULTURE     Status: None   Collection Time    03/12/14  6:43 PM      Result Value Ref Range Status   Specimen Description WOUND   Final   Special Requests EPIDURAL FLUID  LUMBAR NO 2   Final     NO ORGANISMS SEEN   Culture PENDING   Incomplete   Report Status PENDING   Incomplete  WOUND CULTURE     Status: None   Collection Time    03/12/14  6:50 PM      Result Value Ref Range Status   Specimen Description WOUND   Final   Special Requests EPIDURAL FLUID LUMBAR WOUND SWAB NO 3   Final     NO ORGANISMS SEEN   Value: NO GROWTH     Performed at [facility]   Report Status PENDING   Incomplete  ANAEROBIC CULTURE     Status: None   Collection Time    03/12/14  6:50 PM      Result Value Ref Range Status   Specimen Description WOUND   Final   Special Requests EPIDURAL FLUID LUMBAR WOUND NO 3   Final   Gram Stain     Final   Value: RARE WBC PRESENT, PREDOMINANTLY PMN     NO ORGANISMS SEEN   Culture PENDING   Incomplete   Report Status PENDING   Incomplete     Scheduled Meds: . ceFEPime (MAXIPIME) IV  1 g Intravenous 3 times per day  . feeding supplement   237 mL Oral BID BM  . losartan  50 mg Oral QHS  . pramipexole  2 mg Oral q morning - 10a   And  . pramipexole  1 mg Oral Q1200   And  . pramipexole  2 mg Oral QHS  . senna-docusate  2 tablet Oral QHS  . vancomycin  1,000 mg Intravenous Q12H   Continuous Infusions: . 0.45 % NaCl with KCl 20 mEq / L    . sodium chloride

## 2014-03-14 NOTE — Progress Notes (Signed)
Physical Therapy Treatment Patient Details Name: TEAGEN MCLEARY MRN: [id number] DOB: May 15, 1931 Today's Date: 03/14/2014    History of Present Illness 80 yo readmission due to s/p fall x2 ( bathroom / off couch) found to have epidural abscess. PT now s/p I&D by Dr Saintclair Halsted. Pt with PLIF L4-5 on 02/06/14 by [name]    PT Comments    Pt OOB in recliner sitting flex forward.  Assisted with standing Min Assist and amb limited distance.  Very unsteady/shaky gait.  Pt will need ST Rehab at SNF.  Follow Up Recommendations  SNF     Equipment Recommendations       Recommendations for Other Services       Precautions / Restrictions Precautions Precautions: Back;Fall Precaution Comments: poor recall of precautions Required Braces or Orthoses: Spinal Brace Spinal Brace: Lumbar corset;Applied in sitting position Restrictions Weight Bearing Restrictions: Yes LLE Weight Bearing: Weight bearing as tolerated    Mobility  Bed Mobility               General bed mobility comments: Pt OOB in recliner  Transfers Overall transfer level: Needs assistance Equipment used: Rolling walker (2 wheeled) Transfers: Sit to/from Stand Sit to Stand: Min assist;Mod assist         General transfer comment: 25% VC's on propper tech and hand placement esp with stand to sit to control desend.  Ambulation/Gait Ambulation/Gait assistance: Min assist Ambulation Distance (Feet): 12 Feet Assistive device: Rolling walker (2 wheeled) Gait Pattern/deviations: Step-to pattern;Decreased step length - left;Decreased step length - right;Trunk flexed;Narrow base of support Gait velocity: decreased   General Gait Details: Very unsteady/shaky gait with poor standing posture and limited activity tolerance.  HIGH FALL RISK   Stairs            Wheelchair Mobility    Modified Rankin (Stroke Patients Only)       Balance                                    Cognition                            Exercises      General Comments        Pertinent Vitals/Pain      Home Living                      Prior Function            PT Goals (current goals can now be found in the care plan section) Progress towards PT goals: Progressing toward goals    Frequency  Min 5X/week    PT Plan      Co-evaluation             End of Session Equipment Utilized During Treatment: Gait belt;Back brace Activity Tolerance: Patient limited by fatigue Patient left: in chair;with call bell/phone within reach;with chair alarm set;with family/visitor present     Time: 6283-6629 PT Time Calculation (min): 13 min  Charges:  $Gait Training: 8-22 mins                    G Codes:      Rica Koyanagi  PTA [facility]  Acute  Rehab Pager      [phone]

## 2014-03-14 NOTE — Progress Notes (Signed)
Subjective: Patient reports Patient feeling better with less leg pain still some soreness in his back  Objective: Vital signs in last 24 hours: Temp:  [97.8 F (36.6 C)-98.2 F (36.8 C)] 98 F (36.7 C) (09/29 0952) Pulse Rate:  [72-83] 72 (09/29 0952) Resp:  [20-22] 20 (09/29 0952) BP: (121-148)/(57-73) 148/68 mmHg (09/29 0952) SpO2:  [95 %-99 %] 98 % (09/29 0952)  Intake/Output from previous day: 09/28 0701 - 09/29 0700 In: -  Out: 700 [Urine:700] Intake/Output this shift:    Strength out of 5 wound clean dry and intact  Lab Results:  Recent Labs  03/12/14 1515 03/14/14 0740  WBC 7.2 5.1  HGB 9.8* 8.3*  HCT 30.7* 26.3*  PLT 210 209   BMET  Recent Labs  03/12/14 1515 03/14/14 0740  NA 136* 139  K 3.6* 3.7  CL 102 105  CO2 24 22  GLUCOSE 97 91  BUN 17 16  CREATININE 0.73 0.81  CALCIUM 8.5 8.2*    Studies/Results: Mr Lumbar Spine W Wo Contrast  03/12/2014   CLINICAL DATA:  Worsening severe right lower extremity pain and numbness following back surgery 02/06/2014. Question root compression.  EXAM: MRI LUMBAR SPINE WITHOUT AND WITH CONTRAST  TECHNIQUE: Multiplanar and multiecho pulse sequences of the lumbar spine were obtained without and with intravenous contrast.  CONTRAST:  5mL MULTIHANCE GADOBENATE DIMEGLUMINE 529 MG/ML IV SOLN  COMPARISON:  Lumbar spine CT 02/27/2014, radiographs 02/27/2014 and MRI 08/25/2013.  FINDINGS: The study is significantly motion degraded, despite repeating several sequences. There are postsurgical changes at L4-5 status post laminectomy and PLIF. Approximately 9 mm of anterolisthesis at the operative level is similar to the preoperative MRI and postoperative CT. However, there is extensive bone marrow edema and enhancement throughout the L4 and L5 vertebral bodies. There is endplate irregularity and enhancement within the L4-5 disc space. These findings are concerning for postoperative infection.  There is a 1.5 cm peripherally enhancing  fluid collection within the laminectomy bed. No focal epidural fluid collection is identified. However, there is mass effect on the thecal sac which is posteriorly compressed, best seen on the postcontrast sagittal images (series 9). The AP diameter of the thecal sac is approximately 6 mm.  The alignment is otherwise normal. There are stable endplate degenerative changes at L1-2. The conus medullaris extends to the T12-L1 level. There is no definite abnormal intradural enhancement.  Multiple large renal cysts are noted.  L1-2: Stable disc bulging and endplate degeneration. No significant spinal stenosis or nerve root encroachment.  L2-3: Annular disc bulging and a broad-based left foraminal disc protrusion are grossly stable. There is mild facet and ligamentous hypertrophy. The resulting moderate spinal and left lateral recess stenosis appears unchanged.  L3-4: Mild disc bulging, facet and ligamentous hypertrophy. Mild spinal stenosis. No nerve root encroachment.  L4-5:  Detailed above.  L5-S1: Stable chronic degenerative disc disease with loss of disc height and posterior osteophytes. Mild foraminal narrowing is present bilaterally.  IMPRESSION: 1. Prominent bone marrow edema and enhancement within the L4 and L5 vertebral bodies status post recent laminectomy and PLIF. These findings are worrisome for postoperative infection with vertebral osteomyelitis. 2. Largely due to the residual anterolisthesis at the operative level, there is compression of the thecal sac. There is a small nonspecific peripherally enhancing fluid collection within the laminectomy bed. No significant epidural fluid collection identified. 3. Stable degenerative changes at the additional levels.   Electronically Signed   By: Camie Patience M.D.   On: 03/12/2014 14:18  Assessment/Plan: Continue to mobilize with physical and occupational therapy continue Hemovac drains were now PICC line is placed we'll plan placement when cleared from  infectious disease and medical standpoint from my perspective he can go at any point.  LOS: 2 days     Marianny Goris P 03/14/2014, 10:35 AM

## 2014-03-14 NOTE — Progress Notes (Signed)
Peripherally Inserted Central Catheter/Midline Placement  The IV Nurse has discussed with the patient and/or persons authorized to consent for the patient, the purpose of this procedure and the potential benefits and risks involved with this procedure.  The benefits include less needle sticks, lab draws from the catheter and patient may be discharged home with the catheter.  Risks include, but not limited to, infection, bleeding, blood clot (thrombus formation), and puncture of an artery; nerve damage and irregular heat beat.  Alternatives to this procedure were also discussed.  PICC/Midline Placement Documentation  PICC / Midline Single Lumen 37/16/96 PICC Right Basilic 42 cm 0 cm (Active)  Indication for Insertion or Continuance of Line Home intravenous therapies (PICC only) 03/14/2014  8:37 AM  Exposed Catheter (cm) 0 cm 03/14/2014  8:37 AM  Site Assessment Clean;Dry;Intact 03/14/2014  8:37 AM  Line Status Saline locked 03/14/2014  8:37 AM  Dressing Type Transparent 03/14/2014  8:37 AM  Dressing Status Clean;Dry;Intact;Antimicrobial disc in place 03/14/2014  8:37 AM  Dressing Change Due 03/21/14 03/14/2014  8:37 AM     Consent obtained by Jacobo Forest, RN, CRNI  Henderson [place] 03/14/2014, 8:40 AM

## 2014-03-14 NOTE — Clinical Social Work Placement (Addendum)
Clinical Social Work Department CLINICAL SOCIAL WORK PLACEMENT NOTE 03/14/2014  Patient:  Derrick Sanchez, Derrick Sanchez  Account Number:  [id number] Admit date:  03/12/2014  Clinical Social Worker:  Delrae Sawyers  Date/time:  03/14/2014 11:11 AM  Clinical Social Work is seeking post-discharge placement for this patient at the following level of care:   SKILLED NURSING   (*CSW will update this form in Epic as items are completed)   03/14/2014  Patient/family provided with [facility] Department of Clinical Social Work's list of facilities offering this level of care within the geographic area requested by the patient (or if unable, by the patient's family).  03/14/2014  Patient/family informed of their freedom to choose among providers that offer the needed level of care, that participate in Medicare, Medicaid or managed care program needed by the patient, have an available bed and are willing to accept the patient.  03/14/2014  Patient/family informed of [facility]' ownership interest in [facility], as well as of the fact that they are under no obligation to receive care at this facility.  PASARR submitted to EDS on  PASARR number received on   FL2 transmitted to all facilities in geographic area requested by pt/family on  03/14/2014 FL2 transmitted to all facilities within larger geographic area on   Patient informed that his/her managed care company has contracts with or will negotiate with  certain facilities, including the following:     Patient/family informed of bed offers received:  03/14/2014 Patient chooses bed at [facility] Physician recommends and patient chooses bed at    Patient to be transferred to [facility] on  03/16/2014 Patient to be transferred to facility by [facility] Patient and family notified of transfer on 03/16/2014 Name of family member notified:  Pt's son, Derrick Sanchez, notified via phone. Pt and pt's wife updated at  bedside.  The following physician request were entered in Epic:   Additional Comments: PASARR previously existing.  Lubertha Sayres, [place] ([phone]) Licensed Clinical Social Worker Neuroscience [phone]) and Medical ICU (46M)

## 2014-03-15 LAB — WOUND CULTURE
CULTURE: NO GROWTH
Culture: NO GROWTH
Culture: NO GROWTH

## 2014-03-15 LAB — BASIC METABOLIC PANEL
Anion gap: 10 (ref 5–15)
BUN: 13 mg/dL (ref 6–23)
CHLORIDE: 105 meq/L (ref 96–112)
CO2: 23 meq/L (ref 19–32)
CREATININE: 0.77 mg/dL (ref 0.50–1.35)
Calcium: 7.9 mg/dL — ABNORMAL LOW (ref 8.4–10.5)
GFR calc Af Amer: >90 mL/min (ref >90)
GFR calc non Af Amer: 83 mL/min — ABNORMAL LOW (ref >90)
Glucose, Bld: 123 mg/dL — ABNORMAL HIGH (ref 70–99)
POTASSIUM: 3.2 meq/L — AB (ref 3.7–5.3)
Sodium: 138 mEq/L (ref 137–147)

## 2014-03-15 MED ORDER — RIFAMPIN 300 MG PO CAPS
300.0000 mg | ORAL_CAPSULE | Freq: Every day | ORAL | Status: DC
  Administered 2014-03-15 – 2014-03-16 (×2): 300 mg via ORAL
  Filled 2014-03-15 (×2): qty 1

## 2014-03-15 MED ORDER — VANCOMYCIN HCL IN DEXTROSE 1-5 GM/200ML-% IV SOLN: 1000.0000 mg | Freq: Two times a day (BID) | INTRAVENOUS | Status: DC

## 2014-03-15 MED ORDER — DEXTROSE 5 % IV SOLN: 1.0000 g | Freq: Three times a day (TID) | INTRAVENOUS | Status: DC

## 2014-03-15 MED ORDER — POTASSIUM CHLORIDE CRYS ER 20 MEQ PO TBCR
40.0000 meq | EXTENDED_RELEASE_TABLET | Freq: Two times a day (BID) | ORAL | Status: DC
  Administered 2014-03-15 – 2014-03-16 (×3): 40 meq via ORAL
  Filled 2014-03-15 (×3): qty 2

## 2014-03-15 MED ORDER — RIFAMPIN 300 MG PO CAPS: 300.0000 mg | ORAL_CAPSULE | Freq: Every day | ORAL | Status: DC

## 2014-03-15 MED ORDER — OXYCODONE-ACETAMINOPHEN 5-325 MG PO TABS: 1.0000 | ORAL_TABLET | ORAL | Status: DC | PRN

## 2014-03-15 NOTE — Progress Notes (Signed)
Occupational Therapy Treatment Patient Details Name: Derrick Sanchez MRN: [id number] DOB: [birth year] Today's Date: 03/15/2014    History of present illness 80 yo readmission due to s/p fall x2 ( bathroom / off couch) found to have epidural abscess. PT now s/p I&D by Dr Saintclair Halsted. Pt with PLIF L4-5 on 02/06/14 by [name]   OT comments  Pt with incr BIL LE edema and drainage from LT foot wound. Question need for wound care consult. Pt demonstrates sink level adls at min (A) with mod cues for safety with back precautions. OT to continue to follow acutely for adl retraining  Follow Up Recommendations  SNF;Supervision/Assistance - 24 hour    Equipment Recommendations  Other (comment)    Recommendations for Other Services      Precautions / Restrictions Precautions Precautions: Back;Fall Precaution Comments: poor recall; reviewed throughout session Required Braces or Orthoses: Spinal Brace Spinal Brace: Lumbar corset;Applied in sitting position Restrictions Weight Bearing Restrictions: No LLE Weight Bearing: Weight bearing as tolerated       Mobility Bed Mobility Overal bed mobility: Needs Assistance Bed Mobility: Rolling;Sidelying to Sit Rolling: Min guard Sidelying to sit: Min assist       General bed mobility comments: inc hair on arrival  Transfers Overall transfer level: Needs assistance Equipment used: Rolling walker (2 wheeled) Transfers: Sit to/from Stand Sit to Stand: Min assist         General transfer comment: cues for hand placement and scooting to edge of chair    Balance Overall balance assessment: Needs assistance Sitting-balance support: Feet supported;Bilateral upper extremity supported Sitting balance-Leahy Scale: Poor Sitting balance - Comments: bracing with UE support at all times while being (A) with back brace Postural control: Posterior lean Standing balance support: Single extremity supported;During functional activity Standing balance-Leahy  Scale: Poor Standing balance comment: (A) and RW support at all times                   ADL Overall ADL's : Needs assistance/impaired     Grooming: Wash/dry hands;Oral care;Minimal assistance;Standing Grooming Details (indicate cue type and reason): pt needed cues for back precautions and safety             Lower Body Dressing: Total assistance;Sit to/from stand Lower Body Dressing Details (indicate cue type and reason): unable to complete LB dressing for socks or doff Toilet Transfer: Minimal assistance;Ambulation;RW           Functional mobility during ADLs: Minimal assistance;Rolling walker General ADL Comments: Pt reports pain in LT hip limiting progress at this time. Pt eager to d/c to SNF. Pt with BIL LE edema and color changes to bil LE. MD notified. Pt with drainage from LT foot wound. Question need for wound care consult. RN notified.       Vision                     Perception     Praxis      Cognition   Behavior During Therapy: WFL for tasks assessed/performed Overall Cognitive Status: Impaired/Different from baseline Area of Impairment: Memory     Memory: Decreased recall of precautions          General Comments: pt demonstrates executive functioning deficits    Extremity/Trunk Assessment               Exercises General Exercises - Lower Extremity Ankle Circles/Pumps: AROM;Both;10 reps;Supine Long Arc Quad: AROM;Strengthening;Both;10 reps;Seated   Shoulder Instructions  General Comments      Pertinent Vitals/ Pain       Pain Assessment: 0-10 Pain Score: 9  Pain Location: LT hip Pain Descriptors / Indicators: Cramping Pain Intervention(s): Repositioned;Ice applied  Home Living                                          Prior Functioning/Environment              Frequency Min 2X/week     Progress Toward Goals  OT Goals(current goals can now be found in the care plan section)   Progress towards OT goals: Progressing toward goals  Acute Rehab OT Goals Patient Stated Goal: to go to rehab then home OT Goal Formulation: With patient Time For Goal Achievement: 03/27/14 Potential to Achieve Goals: Good ADL Goals Pt Will Perform Grooming: with min guard assist;standing Pt Will Perform Upper Body Bathing: with min guard assist;sitting Pt Will Perform Lower Body Bathing: with min assist;sit to/from stand;with adaptive equipment Pt Will Transfer to Toilet: with min guard assist;bedside commode Additional ADL Goal #1: Pt will complete bed mobility supervision level hob < 20 degrees  Plan Discharge plan remains appropriate    Co-evaluation                 End of Session Equipment Utilized During Treatment: Gait belt;Rolling walker;Back brace   Activity Tolerance Patient tolerated treatment well   Patient Left in chair;with call bell/phone within reach;with family/visitor present   Nurse Communication Mobility status;Precautions        Time: 6948-5462 OT Time Calculation (min): 18 min  Charges: OT General Charges $OT Visit: 1 Procedure OT Treatments $Self Care/Home Management : 8-22 mins  Peri Maris 03/15/2014, 4:24 PM Pager: [phone]

## 2014-03-15 NOTE — Discharge Instructions (Signed)
No lifting no bending no twisting no driving a riding a car less discomfort and forth to see me. Keep the incision clean dry and intact.

## 2014-03-15 NOTE — Progress Notes (Signed)
ANTIBIOTIC CONSULT NOTE  Pharmacy Consult for vancomycin and cefepime Indication: lumbar abscess w/ suspected osteomyelitis  No Known Allergies  Patient Measurements: Height: 6' (182.9 cm) Weight: 164 lb 14.5 oz (74.8 kg) IBW/kg (Calculated) : 77.6  Vital Signs: Temp: 98.4 F (36.9 C) (09/30 0949) Temp src: Oral (09/30 0949) BP: 143/66 mmHg (09/30 0949) Pulse Rate: 72 (09/30 0949)  Labs:  Recent Labs  03/12/14 1515 03/14/14 0740 03/15/14 0516  WBC 7.2 5.1  --   HGB 9.8* 8.3*  --   PLT 210 209  --   CREATININE 0.73 0.81 0.77   Estimated Creatinine Clearance: 76.6 ml/min (by C-G formula based on Cr of 0.77).   Assessment: 80yo male had recent back surgery, c/o worsening non-radiating right hip pain x3-4wk, MRI highly suggestive of post-op epidural abscess and osteomyelitis, now s/p I&D. Started on vancomycin and cefepime on 9/27. Wound cultures from I&D all resulted as negative today. ID service is following- added on rifampin d/t hardware on 9/28.  Goal of Therapy:  Vancomycin trough level 15-20 mcg/ml  Plan:  1. vancomycin 1g IV Q12H 2. cefepime 1g IV Q8H  3. Rifampin 300mg  IV q24h per MD 4. Follow renal function, ID plans, further c/s, trough PRN  Davidlee Jeanbaptiste D. Mackensi Mahadeo, PharmD, BCPS Clinical Pharmacist Pager: [phone] 03/15/2014 1:53 PM

## 2014-03-15 NOTE — Progress Notes (Signed)
Physical Therapy Treatment Patient Details Name: Derrick Sanchez MRN: [id number] DOB: [birth year] Today's Date: 03/15/2014    History of Present Illness 80 yo readmission due to s/p fall x2 ( bathroom / off couch) found to have epidural abscess. PT now s/p I&D by Dr Saintclair Halsted. Pt with PLIF L4-5 on 02/06/14 by [name]    PT Comments    Pt progressing slowly with mobility. Continues to demo balance deficits and require cues for back precautions. Cont to recommend SNF for post acute rehab.   Follow Up Recommendations  SNF     Equipment Recommendations  None recommended by PT    Recommendations for Other Services OT consult     Precautions / Restrictions Precautions Precautions: Back;Fall Precaution Comments: poor recall; reviewed throughout session Required Braces or Orthoses: Spinal Brace Spinal Brace: Lumbar corset;Applied in sitting position Restrictions Weight Bearing Restrictions: No LLE Weight Bearing: Weight bearing as tolerated    Mobility  Bed Mobility Overal bed mobility: Needs Assistance Bed Mobility: Rolling;Sidelying to Sit Rolling: Min guard Sidelying to sit: Min assist       General bed mobility comments: incr (A) to bring LEs off EOB due to weakness and pain; pt able to use UEs more this session to elevate trunk vs previous  Transfers Overall transfer level: Needs assistance Equipment used: Rolling walker (2 wheeled) Transfers: Sit to/from Stand Sit to Stand: Mod assist         General transfer comment: (A) to maintain balance and elevate trunk to standing position; multimodal cues on proper hand placement and technique   Ambulation/Gait Ambulation/Gait assistance: Min assist Ambulation Distance (Feet): 30 Feet (15' x2) Assistive device: Rolling walker (2 wheeled) Gait Pattern/deviations: Decreased stance time - left;Decreased dorsiflexion - left;Antalgic;Trunk flexed;Narrow base of support;Decreased stride length;Decreased step length - right Gait  velocity: decreased Gait velocity interpretation: Below normal speed for age/gender General Gait Details: pt with difficulty putting Lt LE on ground; ambulating on forefoot due to pain with weightbearing in Lt hip; mulmodial cues for sequencing and upright posture; (A) to maintain balance and manage RW; requried sitting rest break to progress mobility due to pain   Stairs            Wheelchair Mobility    Modified Rankin (Stroke Patients Only)       Balance Overall balance assessment: Needs assistance;History of Falls Sitting-balance support: Feet supported;Bilateral upper extremity supported Sitting balance-Leahy Scale: Poor Sitting balance - Comments: bracing with UE support at all times while being (A) with back brace Postural control: Posterior lean Standing balance support: During functional activity;Bilateral upper extremity supported Standing balance-Leahy Scale: Zero Standing balance comment: (A) and RW support at all times                    Cognition Arousal/Alertness: Awake/alert Behavior During Therapy: WFL for tasks assessed/performed Overall Cognitive Status: Impaired/Different from baseline       Memory: Decreased recall of precautions         General Comments: pt demonstrates executive functioning deficits    Exercises General Exercises - Lower Extremity Ankle Circles/Pumps: AROM;Both;10 reps;Supine Long Arc Quad: AROM;Strengthening;Both;10 reps;Seated    General Comments General comments (skin integrity, edema, etc.): RN made aware of new wound drainage on LEs       Pertinent Vitals/Pain Pain Assessment: 0-10 Pain Score: 9  Pain Location: Lt hip  Pain Descriptors / Indicators: Cramping Pain Intervention(s): Limited activity within patient's tolerance;Monitored during session;Heat applied;Premedicated before session    Home  Living                      Prior Function            PT Goals (current goals can now be found  in the care plan section) Acute Rehab PT Goals Patient Stated Goal: to go to rehab then home PT Goal Formulation: With patient Time For Goal Achievement: 03/21/14 Potential to Achieve Goals: Good Progress towards PT goals: Progressing toward goals    Frequency  Min 5X/week    PT Plan Current plan remains appropriate    Co-evaluation             End of Session Equipment Utilized During Treatment: Gait belt;Back brace Activity Tolerance: Patient limited by fatigue;Patient limited by pain Patient left: in chair;with call bell/phone within reach;with chair alarm set;with family/visitor present     Time: 2549-8264 PT Time Calculation (min): 26 min  Charges:  [employer] Training: 23-37 mins                    G CodesMelina Modena [place], [place]  [phone] 03/15/2014, 1:32 PM

## 2014-03-15 NOTE — Care Management Note (Addendum)
  Page 1 of 1   03/15/2014     11:36:23 AM CARE MANAGEMENT NOTE 03/15/2014  Patient:  Derrick Sanchez, Derrick Sanchez   Account Number:  [id number]  Date Initiated:  03/13/2014  Documentation initiated by:  Lorne Skeens  Subjective/Objective Assessment:   Patient was admitted with post-op epidural abscess, lumbar wound debridement.     Action/Plan:   Will follow for discharge needs pending PT/OT evals and physician orders.   Anticipated DC Date:     Anticipated DC Plan:  SKILLED NURSING FACILITY  In-house referral  Clinical Social Worker         Choice offered to / List presented to:             Status of service:   Medicare Important Message given?  YES (If response is "NO", the following Medicare IM given date fields will be blank) Date Medicare IM given:  03/15/2014 Medicare IM given by:  Lorne Skeens Date Additional Medicare IM given:   Additional Medicare IM given by:    Discharge Disposition:    Per UR Regulation:  Reviewed for med. necessity/level of care/duration of stay  If discussed at [place] of Stay Meetings, dates discussed:    Comments:  03/15/14 [place], MSN, CM- Medicare IM letter provided.

## 2014-03-15 NOTE — Progress Notes (Signed)
INFECTIOUS DISEASE PROGRESS NOTE  ID: Derrick Sanchez is a 80 y.o. male with  Active Problems:   Spinal epidural abscess  Subjective: Without complaints  Abtx:  Anti-infectives   Start     Dose/Rate Route Frequency Ordered Stop   03/13/14 1830  rifampin (RIFADIN) 300 mg in sodium chloride 0.9 % 100 mL IVPB     300 mg 200 mL/hr over 30 Minutes Intravenous Every 24 hours 03/13/14 1744     03/13/14 1800  cefTRIAXone (ROCEPHIN) 2 g in dextrose 5 % 50 mL IVPB  Status:  Discontinued     2 g 100 mL/hr over 30 Minutes Intravenous Every 24 hours 03/12/14 2043 03/12/14 2058   03/13/14 0400  vancomycin (VANCOCIN) IVPB 1000 mg/200 mL premix     1,000 mg 200 mL/hr over 60 Minutes Intravenous Every 12 hours 03/12/14 2237     03/12/14 2245  ceFEPIme (MAXIPIME) 1 g in dextrose 5 % 50 mL IVPB     1 g 100 mL/hr over 30 Minutes Intravenous 3 times per day 03/12/14 2237     03/12/14 1858  vancomycin (VANCOCIN) powder  Status:  Discontinued       As needed 03/12/14 1858 03/12/14 1937   03/12/14 1832  bacitracin 50,000 Units in sodium chloride irrigation 0.9 % 500 mL irrigation  Status:  Discontinued       As needed 03/12/14 1833 03/12/14 1937   03/12/14 1830  vancomycin (VANCOCIN) 1,000 mg in sodium chloride 0.9 % 250 mL IVPB     1,000 mg 250 mL/hr over 60 Minutes Intravenous  Once 03/12/14 1812 03/12/14 1925   03/12/14 1815  cefTRIAXone (ROCEPHIN) 2 g in dextrose 5 % 50 mL IVPB     2 g 100 mL/hr over 30 Minutes Intravenous  Once 03/12/14 1812 03/12/14 1855      Medications:  Scheduled: . ceFEPime (MAXIPIME) IV  1 g Intravenous 3 times per day  . losartan  50 mg Oral QHS  . multivitamin with minerals  1 tablet Oral Daily  . potassium chloride  40 mEq Oral BID  . pramipexole  2 mg Oral q morning - 10a   And  . pramipexole  1 mg Oral Q1200   And  . pramipexole  2 mg Oral QHS  . rifampin (RIFADIN) IVPB  300 mg Intravenous Q24H  . senna-docusate  2 tablet Oral QHS  . sodium chloride  3  mL Intravenous Q12H  . vancomycin  1,000 mg Intravenous Q12H    Objective: Vital signs in last 24 hours: Temp:  [97.8 F (36.6 C)-98.4 F (36.9 C)] 97.8 F (36.6 C) (09/30 1352) Pulse Rate:  [66-75] 66 (09/30 1352) Resp:  [18-22] 20 (09/30 1352) BP: (143-156)/(66-76) 144/66 mmHg (09/30 1352) SpO2:  [93 %-100 %] 98 % (09/30 1352)   General appearance: alert, cooperative, no distress and pale Resp: clear to auscultation bilaterally Cardio: regular rate and rhythm GI: normal findings: bowel sounds normal and soft, non-tender  Lab Results  Recent Labs  03/14/14 0740 03/15/14 0516  WBC 5.1  --   HGB 8.3*  --   HCT 26.3*  --   NA 139 138  K 3.7 3.2*  CL 105 105  CO2 22 23  BUN 16 13  CREATININE 0.81 0.77   Liver Panel No results found for this basename: PROT, ALBUMIN, AST, ALT, ALKPHOS, BILITOT, BILIDIR, IBILI,  in the last 72 hours Sedimentation Rate No results found for this basename: ESRSEDRATE,  in the last  72 hours C-Reactive Protein No results found for this basename: CRP,  in the last 72 hours  Microbiology: Recent Results (from the past 240 hour(s))  ANAEROBIC CULTURE     Status: None   Collection Time    03/12/14  6:25 PM      Result Value Ref Range Status   Specimen Description WOUND   Final   Special Requests DEEP SPACE LUMBAR WOUND   Final   Gram Stain     Final   Value: RARE WBC PRESENT,BOTH PMN AND MONONUCLEAR     NO ORGANISMS SEEN     Performed at [facility]   Culture     Final   Value: NO ANAEROBES ISOLATED; CULTURE IN PROGRESS FOR 5 DAYS     Performed at [facility]   Report Status PENDING   Incomplete  WOUND CULTURE     Status: None   Collection Time    03/12/14  6:25 PM      Result Value Ref Range Status   Specimen Description WOUND BACK   Final   Special Requests DEEP SPACE LUMBAR WOUND   Final   Gram Stain     Final   Value: RARE WBC PRESENT,BOTH PMN AND MONONUCLEAR     NO ORGANISMS SEEN     Performed at [facility]   Culture     Final   Value: NO GROWTH 2 DAYS     Performed at [facility]   Report Status 03/15/2014 FINAL   Final  WOUND CULTURE     Status: None   Collection Time    03/12/14  6:43 PM      Result Value Ref Range Status   Specimen Description WOUND   Final   Special Requests EPIDURAL FLUID LUMBAR WOUND SWAB NO 2   Final   Gram Stain     Final   Value: RARE WBC PRESENT, PREDOMINANTLY PMN     NO ORGANISMS SEEN     Performed at [facility]   Culture     Final   Value: NO GROWTH 2 DAYS     Performed at [facility]   Report Status 03/15/2014 FINAL   Final  ANAEROBIC CULTURE     Status: None   Collection Time    03/12/14  6:43 PM      Result Value Ref Range Status   Specimen Description WOUND   Final   Special Requests EPIDURAL FLUID LUMBAR NO 2   Final   Gram Stain     Final   Value: RARE WBC PRESENT, PREDOMINANTLY PMN     NO ORGANISMS SEEN     Performed at [facility]   Culture     Final   Value: NO ANAEROBES ISOLATED; CULTURE IN PROGRESS FOR 5 DAYS     Performed at [facility]   Report Status PENDING   Incomplete  WOUND CULTURE     Status: None   Collection Time    03/12/14  6:50 PM      Result Value Ref Range Status   Specimen Description WOUND   Final   Special Requests EPIDURAL FLUID LUMBAR WOUND SWAB NO 3   Final   Gram Stain     Final   Value: RARE WBC PRESENT, PREDOMINANTLY PMN     NO ORGANISMS SEEN     Performed at [facility]   Culture     Final   Value: NO GROWTH  2 DAYS     Performed at [facility]   Report Status 03/15/2014 FINAL   Final  ANAEROBIC CULTURE     Status: None   Collection Time    03/12/14  6:50 PM      Result Value Ref Range Status   Specimen Description WOUND   Final   Special Requests EPIDURAL FLUID LUMBAR WOUND NO 3   Final   Gram Stain     Final   Value: RARE WBC PRESENT, PREDOMINANTLY PMN     NO ORGANISMS SEEN     Performed at [facility]   Culture      Final   Value: NO ANAEROBES ISOLATED; CULTURE IN PROGRESS FOR 5 DAYS     Performed at [facility]   Report Status PENDING   Incomplete    Studies/Results: No results found.   Assessment/Plan: Post-operative infection, presence of hardware  Epidural Abscess  Osteomyelitis  LLE swelling   Total days of antibiotics: 3 cefepime/vanco/rifampin  His Cx are (-) Would continue his current anbx for 6 weeks Change rifampin to qday, po Glad to see him in ID clinic for f/u.  Available if questions         [name] Infectious Diseases (pager) [phone] [email or web address] 03/15/2014, 4:38 PM  LOS: 3 days

## 2014-03-15 NOTE — Progress Notes (Signed)
Subjective: Patient reports ddoing well significant improvement in pain control  Objective: Vital signs in last 24 hours: Temp:  [97.8 F (36.6 C)-98.4 F (36.9 C)] 97.8 F (36.6 C) (09/30 1352) Pulse Rate:  [66-75] 66 (09/30 1352) Resp:  [18-22] 20 (09/30 1352) BP: (143-156)/(66-73) 144/66 mmHg (09/30 1352) SpO2:  [93 %-99 %] 98 % (09/30 1352)  Intake/Output from previous day: 09/29 0701 - 09/30 0700 In: -  Out: 100 [Drains:100] Intake/Output this shift:    progressive mobilization with physical occupational therapy to strength out of 5 wound clean dry and intact  Lab Results:  Recent Labs  03/14/14 0740  WBC 5.1  HGB 8.3*  HCT 26.3*  PLT 209   BMET  Recent Labs  03/14/14 0740 03/15/14 0516  NA 139 138  K 3.7 3.2*  CL 105 105  CO2 22 23  GLUCOSE 91 123*  BUN 16 13  CREATININE 0.81 0.77  CALCIUM 8.2* 7.9*    Studies/Results: No results found.  Assessment/Plan: Probable discharged to the nursing facility tomorrow on IV antibiotics to include cefepime vancomycin and rifampin   LOS: 3 days     Derrick Sanchez P 03/15/2014, 7:07 PM

## 2014-03-15 NOTE — Consult Note (Signed)
[facility] PROGRESS NOTE  Derrick Sanchez ZES:[id number] DOB: 05/19/1931 DOA: 03/12/2014 PCP: [name], MD  Brief narrative: 80 year old male with past medical history of OSA, asthma, hyperparathyroidism status post parathyroidectomy, status post lumbar decompressive laminectomy L4-5 about 5 weeks PTA. Pt had a fall about 1 and a half week prior to this admission. Pt presented to [facility] with progressive right hip pain. Imaging studies included right hip CT scan which was negative for acute fracture. Ultimately MRI lumbar spine showed prominent bone marrow edema and enhancement within the L4 and L5 vertebral bodies status post recent laminectomy and PLIF, findings worrisome for postoperative infection with vertebral osteomyelitis. Pt was transferred to [facility] for further management. Pt underwent reexploration of lumbar wound with evacuation of lumbar epidural abscess and I&D of lumbar wound and hardware. Pt is on broad spectrum antibiotic coverage, vanco and cefepime.  Assessment and Plan:    Principal Problem: Spinal epidural abscess  Status post reexploration of lumbar wound for evacuation of lumbar epidural abscess and I&D of lumbar wound and hardware.  Started vancomycin and cefepime. Results are still negative.   Appreciate ID -may be able to narrow. Midline has been placed 9/29  Continue pain management Percocet 1 to 2q4 when necessary. Discontinue Dilaudid  Active Problems: Hypertension  Continue losartan 50 mg daily  Saline lock IV Restless leg syndrome  Continue pramipexole 2 mg a.m., 2 mg p.m. Hypokalemia  Unclear etiology; saline locked   Replace kdur 40 bid po Left lower extremity swelling  With chronic superimposed venous stasis changes.   not DVT and ultrasound 9/20 was negative  Has chronic dorsal foot ulcer that is not infected appearing Protein calorie malnutrition, severe  Continue nutritional supplements  Anemia  Unclear etiology-baseline seems to be  9-10 range  Would guaiac stools as an outpatient--rechekc in am DVT prophylaxis  SCD' s bilaterally   Code Status: Full Family Communication: discussed with [facility] at [facility] in a couple of days once ID input   IV Access:   Peripheral IV Procedures and diagnostic studies:   Mr Lumbar Spine W Wo Contrast 03/12/2014  1. Prominent bone marrow edema and enhancement within the L4 and L5 vertebral bodies status post recent laminectomy and PLIF. These findings are worrisome for postoperative infection with vertebral osteomyelitis. 2. Largely due to the residual anterolisthesis at the operative level, there is compression of the thecal sac. There is a small nonspecific peripherally enhancing fluid collection within the laminectomy bed. No significant epidural fluid collection identified. 3. Stable degenerative changes at the additional levels.   Ct Hip Right Wo Contrast 03/12/2014   1. Status post total hip arthroplasty. 2.  No evidence for acute  abnormality of the right hip. 3. Known fracture at the greater trochanter of the left hip. 4. Distended urinary bladder.  Medical Consultants:   Infectious disease  Other Consultants:   Physical therapy  Anti-Infectives:   Vancomycin 03/12/2014 --> Rocephin 03/12/2014 --> 03/12/2014 Cefepime 03/12/2014 --> Rifampicin 9/28-->  Verneita Griffes, MD [facility] [phone]  03/15/2014, 1:51 PM   LOS: 3 days   HPI/Subjective:  Doing good tol diet Ambulatory  Objective: Filed Vitals:   03/14/14 1805 03/15/14 0216 03/15/14 0640 03/15/14 0949  BP: 152/76 144/73 156/71 143/66  Pulse: 72 75 68 72  Temp: 98.1 F (36.7 C) 98.1 F (36.7 C) 98 F (36.7 C) 98.4 F (36.9 C)  TempSrc: Oral Oral Oral Oral  Resp: 20 18 22 20   Height:  Weight:      SpO2: 100% 97% 93% 99%    Intake/Output Summary (Last 24 hours) at 03/15/14 1351 Last data filed at 03/15/14 1000  Gross per 24 hour  Intake    120 ml  Output    300 ml  Net   -180 ml     Exam:   General:  Pt is alert, follows commands appropriately, not in acute distress  Cardiovascular: Regular rate and rhythm, S1/S2 appreciated   Respiratory: Clear to auscultation bilaterally, no wheezing, no crackles, no rhonchi  Abdomen: Soft, non tender, non distended, bowel sounds present, no guarding  Extremities: Left lower extremity swelling with erythema and chronic venous changes, pulses DP and PT palpable bilaterally  Neuro: Grossly nonfocal  Data Reviewed: Basic Metabolic Panel:  Recent Labs Lab 03/12/14 1515 03/14/14 0740 03/15/14 0516  NA 136* 139 138  K 3.6* 3.7 3.2*  CL 102 105 105  CO2 24 22 23   GLUCOSE 97 91 123*  BUN 17 16 13   CREATININE 0.73 0.81 0.77  CALCIUM 8.5 8.2* 7.9*   Liver Function Tests:  Recent Labs Lab 03/12/14 1515  AST 21  ALT 13  ALKPHOS 98  BILITOT 0.3  PROT 7.0  ALBUMIN 2.3*   No results found for this basename: LIPASE, AMYLASE,  in the last 168 hours No results found for this basename: AMMONIA,  in the last 168 hours CBC:  Recent Labs Lab 03/12/14 1515 03/14/14 0740  WBC 7.2 5.1  NEUTROABS 5.5  --   HGB 9.8* 8.3*  HCT 30.7* 26.3*  MCV 90.3 89.5  PLT 210 209   Cardiac Enzymes: No results found for this basename: CKTOTAL, CKMB, CKMBINDEX, TROPONINI,  in the last 168 hours BNP: No components found with this basename: POCBNP,  CBG: No results found for this basename: GLUCAP,  in the last 168 hours  ANAEROBIC CULTURE     Status: None   Collection Time    03/12/14  6:25 PM      Result Value Ref Range Status   Specimen Description WOUND   Final   Special Requests DEEP SPACE LUMBAR WOUND   Final   Gram Stain     Final   Value: RARE WBC PRESENT,BOTH PMN AND MONONUCLEAR     NO ORGANISMS SEEN     Performed at [facility]   Culture PENDING   Incomplete   Report Status PENDING   Incomplete  WOUND CULTURE     Status: None   Collection Time    03/12/14  6:25 PM      Result Value Ref Range Status    Specimen Description WOUND BACK   Final   Special Requests DEEP SPACE LUMBAR WOUND   Final   Value: NO GROWTH     Performed at [facility]   Report Status PENDING   Incomplete  WOUND CULTURE     Status: None   Collection Time    03/12/14  6:43 PM      Result Value Ref Range Status   Specimen Description WOUND   Final   Special Requests EPIDURAL FLUID LUMBAR WOUND SWAB NO 2   Final   Value: NO GROWTH     Performed at [facility]   Report Status PENDING   Incomplete  ANAEROBIC CULTURE     Status: None   Collection Time    03/12/14  6:43 PM      Result Value Ref Range Status   Specimen Description WOUND  Final   Special Requests EPIDURAL FLUID LUMBAR NO 2   Final     NO ORGANISMS SEEN   Culture PENDING   Incomplete   Report Status PENDING   Incomplete  WOUND CULTURE     Status: None   Collection Time    03/12/14  6:50 PM      Result Value Ref Range Status   Specimen Description WOUND   Final   Special Requests EPIDURAL FLUID LUMBAR WOUND SWAB NO 3   Final     NO ORGANISMS SEEN   Value: NO GROWTH     Performed at [facility]   Report Status PENDING   Incomplete  ANAEROBIC CULTURE     Status: None   Collection Time    03/12/14  6:50 PM      Result Value Ref Range Status   Specimen Description WOUND   Final   Special Requests EPIDURAL FLUID LUMBAR WOUND NO 3   Final   Gram Stain     Final   Value: RARE WBC PRESENT, PREDOMINANTLY PMN     NO ORGANISMS SEEN   Culture PENDING   Incomplete   Report Status PENDING   Incomplete     Scheduled Meds: . ceFEPime (MAXIPIME) IV  1 g Intravenous 3 times per day  . feeding supplement   237 mL Oral BID BM  . losartan  50 mg Oral QHS  . pramipexole  2 mg Oral q morning - 10a   And  . pramipexole  1 mg Oral Q1200   And  . pramipexole  2 mg Oral QHS  . senna-docusate  2 tablet Oral QHS  . vancomycin  1,000 mg Intravenous Q12H   Continuous Infusions: . 0.45 % NaCl with KCl 20 mEq / L    . sodium chloride

## 2014-03-15 NOTE — Discharge Summary (Signed)
Physician Discharge Summary  Patient ID: Derrick Sanchez MRN: [id number] DOB/AGE: [birth year] 78 y.o.  Admit date: 03/12/2014 Discharge date: 03/15/2014  Admission Diagnoses:lumbar epidural abscess osteomyelitis  Discharge Diagnoses: same Active Problems:   Spinal epidural abscess   Discharged Condition: good  Hospital Course: patient is admitted to the burns room with progressive worsening back pain an MRI scan that was consistent with a lumbar epidural abscess and osteomyelitis. Patient emergently taken to the operating room underwent a reexploration of lumbar wound evacuation of lumbar epidural abscess cultures and I&D. Postop patient did very well with recovered and then the floor on the floor was convalescing well significant proven pain on postop day 1 he was started on IV and about her cultures never grew out any bacteria but he'll be maintained on cefepime vancomycin and rifampin he is being discharged to [facility] skilled nursing facility tomorrow.  Consults:internal medicine and infectious disease Significant Diagnostic Studies: Treatments:I&D of lumbar wound Discharge Exam: Blood pressure 166/71, pulse 72, temperature 98.1 F (36.7 C), temperature source Oral, resp. rate 20, height 6' (1.829 m), weight 74.8 kg (164 lb 14.5 oz), SpO2 100.00%. Strength out of 5 wound clean dry and intact  Disposition: [facility]     Medication List         acetaminophen 325 MG tablet  Commonly known as:  TYLENOL  Take 2 tablets (650 mg total) by mouth every 6 (six) hours as needed for mild pain (or Fever >/= 101).     b complex vitamins tablet  Take 1 tablet by mouth every morning.     bisacodyl 10 MG suppository  Commonly known as:  DULCOLAX  Place 1 suppository (10 mg total) rectally as needed for moderate constipation.     ceFEPIme 1 g in dextrose 5 % 50 mL  Inject 1 g into the vein every 8 (eight) hours.     enoxaparin 40 MG/0.4ML injection  Commonly known  as:  LOVENOX  Inject 0.4 mLs (40 mg total) into the skin daily. For 21 days     feeding supplement (ENSURE COMPLETE) Liqd  Take 237 mLs by mouth 2 (two) times daily between meals.     HYDROcodone-acetaminophen 5-325 MG per tablet  Commonly known as:  NORCO/VICODIN  Take 1-2 tablets by mouth every 4 (four) hours as needed for moderate pain or severe pain (mild pain).     LORazepam 0.5 MG tablet  Commonly known as:  ATIVAN  Take one tablet by mouth three times daily as needed for anxiety     losartan 50 MG tablet  Commonly known as:  COZAAR  Take 50 mg by mouth at bedtime.     methocarbamol 500 MG tablet  Commonly known as:  ROBAXIN  Take 1 tablet (500 mg total) by mouth every 6 (six) hours as needed for muscle spasms.     oxyCODONE-acetaminophen 5-325 MG per tablet  Commonly known as:  PERCOCET/ROXICET  Take 1-2 tablets by mouth every 4 (four) hours as needed for moderate pain.     pramipexole 1 MG tablet  Commonly known as:  MIRAPEX  Take 1-2 mg by mouth 2 (two) times daily. Take 2 mg by mouth in the morning, and in the evening. 1mg  at [employer].     rifampin 300 MG capsule  Commonly known as:  RIFADIN  Take 1 capsule (300 mg total) by mouth daily.     senna-docusate 8.6-50 MG per tablet  Commonly known as:  Senokot-S  Take 2 tablets by  mouth at bedtime.     sodium phosphate 7-19 GM/118ML Enem  Place 133 mLs (1 enema total) rectally daily as needed for severe constipation.     vancomycin 1 GM/200ML Soln  Commonly known as:  VANCOCIN  Inject 200 mLs (1,000 mg total) into the vein every 12 (twelve) hours.           Follow-up Information   Follow up with [facility] P, MD.   Specialty:  Neurosurgery   Contact information:   [address] [place] [address] [phone]       Signed: Analyah Mcconnon P 03/15/2014, 7:12 PM

## 2014-03-16 DIAGNOSIS — M5416 Radiculopathy, lumbar region: Secondary | ICD-10-CM

## 2014-03-16 LAB — CBC WITH DIFFERENTIAL/PLATELET
Basophils Absolute: 0 10*3/uL (ref 0.0–0.1)
Basophils Relative: 1 % (ref 0–1)
EOS ABS: 0.3 10*3/uL (ref 0.0–0.7)
Eosinophils Relative: 6 % — ABNORMAL HIGH (ref 0–5)
HEMATOCRIT: 25.1 % — AB (ref 39.0–52.0)
Hemoglobin: 8.1 g/dL — ABNORMAL LOW (ref 13.0–17.0)
LYMPHS ABS: 1 10*3/uL (ref 0.7–4.0)
Lymphocytes Relative: 21 % (ref 12–46)
MCH: 29.1 pg (ref 26.0–34.0)
MCHC: 32.3 g/dL (ref 30.0–36.0)
MCV: 90.3 fL (ref 78.0–100.0)
MONOS PCT: 9 % (ref 3–12)
Monocytes Absolute: 0.4 10*3/uL (ref 0.1–1.0)
NEUTROS ABS: 3.1 10*3/uL (ref 1.7–7.7)
Neutrophils Relative %: 63 % (ref 43–77)
Platelets: 202 10*3/uL (ref 150–400)
RBC: 2.78 MIL/uL — AB (ref 4.22–5.81)
RDW: 14.1 % (ref 11.5–15.5)
WBC: 4.8 10*3/uL (ref 4.0–10.5)

## 2014-03-16 LAB — BASIC METABOLIC PANEL
Anion gap: 12 (ref 5–15)
BUN: 12 mg/dL (ref 6–23)
CHLORIDE: 104 meq/L (ref 96–112)
CO2: 22 mEq/L (ref 19–32)
CREATININE: 0.74 mg/dL (ref 0.50–1.35)
Calcium: 7.9 mg/dL — ABNORMAL LOW (ref 8.4–10.5)
GFR calc Af Amer: >90 mL/min (ref >90)
GFR calc non Af Amer: 84 mL/min — ABNORMAL LOW (ref >90)
GLUCOSE: 130 mg/dL — AB (ref 70–99)
POTASSIUM: 3.8 meq/L (ref 3.7–5.3)
Sodium: 138 mEq/L (ref 137–147)

## 2014-03-16 MED ORDER — HEPARIN SOD (PORK) LOCK FLUSH 100 UNIT/ML IV SOLN
250.0000 [IU] | INTRAVENOUS | Status: AC | PRN
  Administered 2014-03-16: 250 [IU]

## 2014-03-16 NOTE — Clinical Social Work Note (Signed)
Pt to be discharged to [facility]. Pt's son, Arush Gatliff, updated via phone. Pt and pt's wife updated at bedside.  [facility] Medicare SNF authorization: [phone] [facility] Medicare EMS authorization: [id number]  [facility] [place] [facility] [place] [facility] SNF: [phone] Pt to be transported via EMS ([facility]).  Lubertha Sayres, [place] [phone]) Licensed Clinical Social Worker Neuroscience [phone]) and Medical ICU (48M)

## 2014-03-16 NOTE — Progress Notes (Signed)
ANTIBIOTIC CONSULT NOTE  Pharmacy Consult for vancomycin and cefepime Indication: lumbar abscess w/ suspected osteomyelitis  No Known Allergies  Patient Measurements: Height: 6' (182.9 cm) Weight: 164 lb 14.5 oz (74.8 kg) IBW/kg (Calculated) : 77.6  Vital Signs: Temp: 98.3 F (36.8 C) (10/01 0900) Temp src: Oral (10/01 0900) BP: 130/68 mmHg (10/01 0900) Pulse Rate: 73 (10/01 0900)  Labs:  Recent Labs  03/14/14 0740 03/15/14 0516 03/16/14 0438  WBC 5.1  --  4.8  HGB 8.3*  --  8.1*  PLT 209  --  202  CREATININE 0.81 0.77 0.74   Estimated Creatinine Clearance: 76.6 ml/min (by C-G formula based on Cr of 0.74).   Assessment: 80yo male had recent back surgery, c/o worsening non-radiating right hip pain x3-4wk, MRI highly suggestive of post-op epidural abscess and osteomyelitis, now s/p I&D. Started on vancomycin and cefepime on 9/27. Wound cultures from I&D all negative and ID has recommended 6 weeks of broad spectrum IV antibiotics.  Added on rifampin d/t hardware on 9/28.  Goal of Therapy:  Vancomycin trough level 15-20 mcg/ml  Plan:  Noted plans for discharge today.  Ideally need a Vancomycin level today to confirm dosing regimen.  Will schedule for this PM.  If patient is discharged prior, would continue 1gm IV q12h dose and check a Vancomycin trough, CBC, and BMET outpt tomorrow morning.  [employer], Pharm.D., BCPS Clinical Pharmacist Pager [phone] 03/16/2014 1:31 PM

## 2014-03-16 NOTE — Progress Notes (Signed)
Patient ID: Derrick Sanchez, male   DOB: Apr 28, 1931, 80 y.o.   MRN: [id number] Doing well plan discharge to [facility]

## 2014-03-16 NOTE — Progress Notes (Signed)
Please see Dr. Algis Downs sign off note 9/30.  6 weeks of cefepime, vancomycin, rifampin.  We will arrange follow up in [facility] in 2-3 weeks, we will arrange.  Needs weekly CMP, CBC, vanco trough with trough goal of 15-20.

## 2014-03-16 NOTE — Progress Notes (Signed)
D/C orders received. Pt notified and verbalized understanding. Writer called report to Therapist, sports at [facility]. Pt taken to SNF by [facility].

## 2014-03-16 NOTE — Progress Notes (Signed)
Doing well slight pain on awakening tol po well No n/v/cp No fever Note low Hemoglobin-probably from anemia of chronic disease-consider OP colonoscopy Otherwise stable from my standpoint for 6 weeks Abx and close follow-up with Dr. Saintclair Halsted and /or ID  Thanks for the consult  Verneita Griffes, MD [facility] (P) [phone]

## 2014-03-17 LAB — ANAEROBIC CULTURE

## 2014-03-17 NOTE — H&P (Signed)
Derrick Sanchez, Derrick Sanchez               ACCOUNT NO.:  [id number]  MEDICAL RECORD NO.:  [id number]  LOCATION:                                 FACILITY:  PHYSICIAN:  Paula Compton. Willey Blade, MD       DATE OF BIRTH:  [birth year]  DATE OF ADMISSION:  03/03/2014 DATE OF DISCHARGE:  09/27/2015LH                             HISTORY & [facility].  HISTORY OF PRESENT ILLNESS:  This patient is an 80 year old male who was transferred from [facility] to the [facility] after falling and fracturing his left greater trochanter.  He was evaluated by Orthopedics.  He was advanced to weightbearing as tolerated but has required assistance in the skilled nursing facility.  He has continued to recover from back surgery on August 24th at which time, he underwent L4-5 lumbar decompression with interbody arthrodesis with AVS PEEK interbody implants.  He had fractured his hip from falling after tripping over his walker and landing on his buttocks.  At the time of my visit, he noted improvement in his pain.  He has participated with physical therapy.  He has a history of osteoarthritis which has been treated with left knee replacement and right hip replacement previously.  PAST MEDICAL HISTORY: 1. Restless legs syndrome. 2. Protein-calorie malnutrition. 3. Recent anemia. 4. Chronic edema. 5. Left hip fracture. 6. Asthma. 7. Vitamin B12 deficiency. 8. Gastroesophageal reflux disease. 9. Esophageal dysmotility. 10.Glaucoma. 11.Hyperparathyroidism status post parathyroidectomy. 12.Impaired fasting glucose.  MEDICATIONS: 1. Losartan 50 mg daily. 2. Remeron 15 mg daily. 3. Mirapex 1 mg t.i.d. 4. Lasix 20 mg daily. 5. Vitamin B12 1000 mcg once a month. 6. Azopt 1% ophthalmic daily. 7. Ventolin p.r.n. 8. Flonase daily.  ALLERGIES:  None.  SOCIAL HISTORY:  He has never been a smoker.  He does not use alcohol. Retired from [employer].  FAMILY HISTORY:  Father died at 8 of an  myocardial infarction.  His mother died at 63 of old age.  Her brother had leukemia.  Brother had a brain tumor.  Two brothers had lung cancer.  Sister had bypass surgery. Another sister had an MI and died at 19.  REVIEW OF SYSTEMS:  Noncontributory.  PHYSICAL EXAMINATION:  GENERAL:  Alert, in no distress. HEENT:  No scleral icterus.  Pharynx moist. NECK:  Supple with no lymphadenopathy. LUNGS:  Clear. HEART:  Regular with no murmurs. ABDOMEN:  Nontender with no hepatosplenomegaly. EXTREMITIES:  Chronic edema in the left leg greater than the right. NEUROLOGIC:  No focal weakness. LYMPH NODES:  No cervical or supraclavicular enlargement. SKIN:  Warm and dry.  IMPRESSION/PLAN: 1. Left greater trochanter fracture.  Continue physical therapy. 2. Status post recent lumbar surgery.  Again, continue physical     therapy. 3. Malnutrition.  Encouraged adequate oral intake. 4. Restless legs syndrome. 5. Chronic edema. 6. Asthma, stable. 7. Vitamin B12 deficiency.  Continue monthly injections. 8. Gastroesophageal reflux disease recently asymptomatic. 9. Esophageal dysmotility. 10.Glaucoma. 11.History of hyperparathyroidism. 12.Left knee replacement. 13.Right hip replacement.     Paula Compton. Willey Blade, MD     ROF/MEDQ  D:  03/16/2014  T:  03/16/2014  Job:  [id number]

## 2014-05-03 ENCOUNTER — Other Ambulatory Visit: Payer: Self-pay | Admitting: Neurosurgery

## 2014-05-03 DIAGNOSIS — Z981 Arthrodesis status: Secondary | ICD-10-CM

## 2014-05-08 ENCOUNTER — Other Ambulatory Visit: Payer: Medicare Other

## 2014-05-17 ENCOUNTER — Ambulatory Visit
Admission: RE | Admit: 2014-05-17 | Discharge: 2014-05-17 | Disposition: A | Discharge status: Home / Self Care | Payer: Medicare Other | Source: Ambulatory Visit | Attending: Neurosurgery | Admitting: Neurosurgery

## 2014-05-17 DIAGNOSIS — Z981 Arthrodesis status: Secondary | ICD-10-CM

## 2014-05-26 ENCOUNTER — Other Ambulatory Visit: Payer: Medicare Other

## 2014-10-03 ENCOUNTER — Telehealth: Payer: Self-pay | Admitting: Internal Medicine

## 2014-10-03 NOTE — Telephone Encounter (Signed)
Discussed case with Dr. Sherwood Gambler of neurosurgery.  Previously diagnosed with osteomyelitis, discitis with hardware in place and treated empirically with vanco/cefepime/rifampin but developed renal insufficiency and completed 4 weeks of antibiotics.  Did well until recent pain and reevaluated by nsrgy with increased ESR of 69.  To get MRI and we will get him back into ID clinic for possible retreatment.

## 2014-10-11 ENCOUNTER — Encounter: Payer: Self-pay | Admitting: Infectious Diseases

## 2014-10-11 ENCOUNTER — Ambulatory Visit (INDEPENDENT_AMBULATORY_CARE_PROVIDER_SITE_OTHER): Payer: PPO | Admitting: Infectious Diseases

## 2014-10-11 ENCOUNTER — Other Ambulatory Visit: Payer: Self-pay | Admitting: Infectious Diseases

## 2014-10-11 VITALS — BP 103/46 | HR 68 | Temp 98.3°F | Ht 72.0 in | Wt 150.0 lb

## 2014-10-11 DIAGNOSIS — G061 Intraspinal abscess and granuloma: Secondary | ICD-10-CM | POA: Diagnosis not present

## 2014-10-11 NOTE — Progress Notes (Signed)
   Subjective:    Patient ID: Derrick Sanchez, male    DOB: [birth year], 81 y.o.   MRN: [id number]  HPI 81 yo M with hx of L4-5 decompression with prosthesis on 02-06-14. He did well post-op and was d/c on 8-26.  His course was further complicated by a fall and L greater trochanter fracture 9-14. He was d/c home on 9-17 with medical mgmt.  He returned on 03-12-14 with worsening back and MRI that was highly suggestive of post-operative infection, osteomyelitis, and epidural abscess. He was afebrile and had WBC that was normal. He had ESR 56. CRP 5. 2.  He underwent debridement/re-exploration 9-27. He was started on vancomycin and ceftriaxone post-operatively, ceftriaxone then changed to cefepime.  His operative Cx were (-). He was d/c home 9-30 with vanco/ceftriaxone/rifampin. He completed only 4 weeks of this as he had issues with increased Cr.  He had f/u CT scan of spine on 05-17-14:  1. Sequelae of discitis osteomyelitis at L4-L5 with endplate erosions and loosening of pedicle screws at both levels, the right L4 screw has breeched the superior right L4 endplate. Nondisplaced right pedicle fracture suspected and may be subacute. No arthrodesis. 2. Nonetheless, underlying L4-L5 anterolisthesis has not significantly changed. The thecal sac is not well delineated at this level. 3. Multifactorial spinal stenosis at L3-L4 appears stable since September. He has had more back pain and has since f/u with [name].  He had MRI done today at [place] office.  No f/c.  Feels like L hip and knee are numb and weak. Has most pain in this area. Felt like his L "hip came out of joint" twice this past week.   No incontenance.   Review of Systems  Constitutional: Negative for chills and unexpected weight change.  Gastrointestinal: Negative for diarrhea and constipation.  Genitourinary: Negative for difficulty urinating.  Musculoskeletal: Positive for back pain and arthralgias.   ESR 44 (10-09-14)      Objective:   Physical Exam  Constitutional: He appears well-developed and well-nourished.  HENT:  Mouth/Throat: No oropharyngeal exudate.  Eyes: EOM are normal. Pupils are equal, round, and reactive to light.  Neck: Neck supple.  Cardiovascular: Normal rate, regular rhythm and normal heart sounds.   Pulmonary/Chest: Effort normal and breath sounds normal.  Abdominal: Soft. Bowel sounds are normal. There is no tenderness.  Musculoskeletal:       Legs: Lymphadenopathy:    He has no cervical adenopathy.      Assessment & Plan:

## 2014-10-11 NOTE — Assessment & Plan Note (Addendum)
I am not clear that his elevated ESR is due to worsening/recurring infection.  His MRI done today was read as: L4-5 chronic/progressive osteo/disctis.  Now with abscess surrounding intervertebral cage new since 2015.  Discussed case with IR- will try to get CT guided aspirate for Cx.  After this is done will restart his anbx

## 2014-10-11 NOTE — Addendum Note (Signed)
Addended by: [name] on: 10/11/2014 04:48 PM   Modules accepted: Orders

## 2014-10-12 ENCOUNTER — Telehealth: Payer: Self-pay | Admitting: *Deleted

## 2014-10-12 NOTE — Telephone Encounter (Signed)
Spoke with IR scheduling and general scheduling.  The patient's CT guided aspirate order is correct, scheduling will contact the patient to have this done. [name], RN

## 2014-10-13 ENCOUNTER — Other Ambulatory Visit: Payer: Self-pay | Admitting: Radiology

## 2014-10-16 ENCOUNTER — Other Ambulatory Visit: Payer: Self-pay | Admitting: Infectious Diseases

## 2014-10-16 ENCOUNTER — Ambulatory Visit (HOSPITAL_COMMUNITY)
Admission: RE | Admit: 2014-10-16 | Discharge: 2014-10-16 | Disposition: A | Discharge status: Home / Self Care | Payer: PPO | Source: Ambulatory Visit | Attending: Infectious Diseases | Admitting: Infectious Diseases

## 2014-10-16 ENCOUNTER — Encounter (HOSPITAL_COMMUNITY): Payer: Self-pay

## 2014-10-16 DIAGNOSIS — G061 Intraspinal abscess and granuloma: Secondary | ICD-10-CM

## 2014-10-16 DIAGNOSIS — G2581 Restless legs syndrome: Secondary | ICD-10-CM | POA: Diagnosis present

## 2014-10-16 DIAGNOSIS — Z9181 History of falling: Secondary | ICD-10-CM | POA: Insufficient documentation

## 2014-10-16 DIAGNOSIS — E213 Hyperparathyroidism, unspecified: Secondary | ICD-10-CM | POA: Diagnosis not present

## 2014-10-16 DIAGNOSIS — J45909 Unspecified asthma, uncomplicated: Secondary | ICD-10-CM | POA: Diagnosis not present

## 2014-10-16 DIAGNOSIS — M549 Dorsalgia, unspecified: Secondary | ICD-10-CM | POA: Diagnosis present

## 2014-10-16 DIAGNOSIS — G4733 Obstructive sleep apnea (adult) (pediatric): Secondary | ICD-10-CM | POA: Diagnosis not present

## 2014-10-16 DIAGNOSIS — K219 Gastro-esophageal reflux disease without esophagitis: Secondary | ICD-10-CM | POA: Insufficient documentation

## 2014-10-16 DIAGNOSIS — I10 Essential (primary) hypertension: Secondary | ICD-10-CM | POA: Diagnosis not present

## 2014-10-16 DIAGNOSIS — Z79899 Other long term (current) drug therapy: Secondary | ICD-10-CM | POA: Insufficient documentation

## 2014-10-16 LAB — CBC
HCT: 30.8 % — ABNORMAL LOW (ref 39.0–52.0)
HEMOGLOBIN: 9.6 g/dL — AB (ref 13.0–17.0)
MCH: 27.8 pg (ref 26.0–34.0)
MCHC: 31.2 g/dL (ref 30.0–36.0)
MCV: 89.3 fL (ref 78.0–100.0)
PLATELETS: 168 10*3/uL (ref 150–400)
RBC: 3.45 MIL/uL — AB (ref 4.22–5.81)
RDW: 14.6 % (ref 11.5–15.5)
WBC: 5.6 10*3/uL (ref 4.0–10.5)

## 2014-10-16 LAB — PROTIME-INR
INR: 1.14 (ref 0.00–1.49)
Prothrombin Time: 14.7 seconds (ref 11.6–15.2)

## 2014-10-16 LAB — APTT: aPTT: 34 seconds (ref 24–37)

## 2014-10-16 MED ORDER — MIDAZOLAM HCL 2 MG/2ML IJ SOLN
INTRAMUSCULAR | Status: AC
  Filled 2014-10-16: qty 2

## 2014-10-16 MED ORDER — MIDAZOLAM HCL 2 MG/2ML IJ SOLN
INTRAMUSCULAR | Status: AC | PRN
  Administered 2014-10-16: 1 mg via INTRAVENOUS

## 2014-10-16 MED ORDER — LIDOCAINE HCL 1 % IJ SOLN
INTRAMUSCULAR | Status: AC
  Filled 2014-10-16: qty 20

## 2014-10-16 MED ORDER — SODIUM CHLORIDE 0.9 % IV SOLN
Freq: Once | INTRAVENOUS | Status: AC
  Administered 2014-10-16: 08:00:00 via INTRAVENOUS

## 2014-10-16 MED ORDER — FENTANYL CITRATE (PF) 100 MCG/2ML IJ SOLN
INTRAMUSCULAR | Status: AC | PRN
  Administered 2014-10-16: 50 ug via INTRAVENOUS

## 2014-10-16 MED ORDER — SODIUM CHLORIDE 0.9 % IJ SOLN
INTRAMUSCULAR | Status: AC
  Filled 2014-10-16: qty 10

## 2014-10-16 MED ORDER — FENTANYL CITRATE (PF) 100 MCG/2ML IJ SOLN
INTRAMUSCULAR | Status: AC
  Filled 2014-10-16: qty 2

## 2014-10-16 NOTE — Discharge Instructions (Signed)

## 2014-10-16 NOTE — Procedures (Signed)
L4/5 Disk aspiration 5 cc dark clear fluid No comp

## 2014-10-16 NOTE — Sedation Documentation (Signed)
Patient denies pain and is resting comfortably.  

## 2014-10-16 NOTE — H&P (Signed)
Chief Complaint: Back pain  Referring Physician(s): Hatcher,Jeffrey C Dr Sherwood Gambler  History of Present Illness: Derrick Sanchez is a 81 y.o. male   Laminectomy surgery 01/2014 Did well- dc from hospital without issue [place] at home in 02/2014 - re injury Repeat surgery with Dr Sherwood Gambler Pt has had continued back pain since then Worsening and evaluated with Dr Sherwood Gambler 09/2014 MRI as OP revealed new Lumbar 4-5 epidural abscess Now scheduled for aspiration of collection   Past Medical History  Diagnosis Date  . Obstructive sleep apnea (adult) (pediatric)   . Unspecified asthma(493.90)   . Hyperparathyroidism, unspecified   . Other vitamin B12 deficiency anemia   . Restless legs syndrome (RLS)   . Esophageal reflux 2008    EGD  . Diverticulosis of colon (without mention of hemorrhage) 2008,2006,2003    Colonoscopy  . Glaucoma   . Internal hemorrhoids without mention of complication 5456    Colonoscopy  . Candidiasis of the esophagus 07-17-2000    EGD  . Stricture and stenosis of esophagus 05-27-2002    EGD  . Colon polyps 05-27-2002    Tubulovillous Adenoma-Colonoscopy  . Hiatal hernia 2002,2003,2013    EGD  . Allergy   . Arthritis   . Blood transfusion   . Cataract     bil eyes  . Hypertension   . Pneumonia     years ago  . Multiple falls     Pt falls asleep without warning and has encounter multiple falls    Past Surgical History  Procedure Laterality Date  . Parathyroidectomy    . Total knee arthroplasty Left   . Hip surgery Right   . Colonoscopy    . Cataract extraction Bilateral   . Mass excision  10/14/2011    Procedure: EXCISION MASS;  Surgeon: Wynonia Sours, MD;  Location: [place];  Service: Orthopedics;  Laterality: Left;   left hand  . Arm surgery    . Eye surgery Bilateral     cataracts iol  . Lumbar wound debridement N/A 03/12/2014    Procedure: LUMBAR WOUND DEBRIDEMENT;  Surgeon: Elaina Hoops, MD;  Location: [facility];   Service: Neurosurgery;  Laterality: N/A;  LUMBAR WOUND DEBRIDEMENT    Allergies: Review of patient's allergies indicates no known allergies.  Medications: Prior to Admission medications   Medication Sig Start Date End Date Taking? Authorizing Provider  amoxicillin (AMOXIL) 500 MG capsule Take 2,000 mg by mouth See admin instructions. Take 4 capsules (2000 mg) by mouth one hour prior to dental procedure    Historical Provider, MD  B Complex-C (B-COMPLEX WITH VITAMIN C) tablet Take 1 tablet by mouth daily.    Historical Provider, MD  bimatoprost (LUMIGAN) 0.01 % SOLN Place 1 drop into both eyes at bedtime.    Historical Provider, MD  ferrous sulfate 325 (65 FE) MG tablet Take 325 mg by mouth daily with breakfast.    Historical Provider, MD  gabapentin (NEURONTIN) 300 MG capsule Take 300-600 mg by mouth 3 (three) times daily. Take 2 capsules (600 mg) every morning, 1 capsule (300 mg) at noon, 2 capsules (600 mg) at bedtime    Historical Provider, MD  losartan (COZAAR) 50 MG tablet Take 50 mg by mouth at bedtime.     Historical Provider, MD  oxyCODONE-acetaminophen (PERCOCET/ROXICET) 5-325 MG per tablet Take 1-2 tablets by mouth every 4 (four) hours as needed for moderate pain. Patient taking differently: Take 1 tablet by mouth See admin instructions. Take  1 tablet twice daily, may take an additional tablet every 6 hours as needed for pain 03/15/14   Kary Kos, MD  pramipexole (MIRAPEX) 1 MG tablet Take 2 mg by mouth 3 (three) times daily.     Historical Provider, MD     Family History  Problem Relation Age of Onset  . Brain cancer      3 brothers  . Heart disease Father   . Heart disease Sister   . Colon cancer Neg Hx   . Esophageal cancer Neg Hx   . Rectal cancer Neg Hx   . Stomach cancer Neg Hx   . Leukemia Brother   . Diabetes Neg Hx   . Kidney disease Neg Hx   . Liver disease Neg Hx     History   Social History  . Marital Status: Married    Spouse Name: N/A  . Number of  Children: 1  . Years of Education: N/A   Occupational History  . Retired    Social History Main Topics  . Smoking status: Never Smoker   . Smokeless tobacco: Never Used  . Alcohol Use: No  . Drug Use: No  . Sexual Activity: Not on file   Other Topics Concern  . None   Social History Narrative    Review of Systems: A 12 point ROS discussed and pertinent positives are indicated in the HPI above.  All other systems are negative.  Review of Systems  Constitutional: Positive for activity change. Negative for fever and unexpected weight change.  Respiratory: Negative for shortness of breath.   Cardiovascular: Negative for chest pain.  Musculoskeletal: Positive for back pain and gait problem. Negative for neck pain.  Neurological: Positive for weakness.  Psychiatric/Behavioral: Negative for behavioral problems and confusion.    Vital Signs: There were no vitals taken for this visit.  Physical Exam  Constitutional: He is oriented to person, place, and time.  Cardiovascular: Normal rate, regular rhythm and normal heart sounds.   No murmur heard. Pulmonary/Chest: Effort normal and breath sounds normal. He has no wheezes.  Abdominal: Soft. Bowel sounds are normal. There is no tenderness.  Musculoskeletal: Normal range of motion. He exhibits tenderness.  Back pain  Neurological: He is alert and oriented to person, place, and time.  Skin: Skin is warm and dry.  Psychiatric: He has a normal mood and affect. His behavior is normal. Thought content normal.  Nursing note and vitals reviewed.   Mallampati Score:  MD Evaluation Airway: WNL Heart: WNL Abdomen: WNL Chest/ Lungs: WNL ASA  Classification: 3 Mallampati/Airway Score: One  Imaging: No results found.  Labs:  CBC:  Recent Labs  03/12/14 1515 03/14/14 0740 03/16/14 0438 10/16/14 0800  WBC 7.2 5.1 4.8 5.6  HGB 9.8* 8.3* 8.1* 9.6*  HCT 30.7* 26.3* 25.1* 30.8*  PLT 210 209 202 168    COAGS:  Recent  Labs  11/22/13 1130 02/28/14 0800 03/12/14 1515 10/16/14 0800  INR 1.06 1.24 1.16 1.14  APTT  --   --  35 34    BMP:  Recent Labs  03/12/14 1515 03/14/14 0740 03/15/14 0516 03/16/14 0438  NA 136* 139 138 138  K 3.6* 3.7 3.2* 3.8  CL 102 105 105 104  CO2 24 22 23 22   GLUCOSE 97 91 123* 130*  BUN 17 16 13 12   CALCIUM 8.5 8.2* 7.9* 7.9*  CREATININE 0.73 0.81 0.77 0.74  GFRNONAA 85* 81* 83* 84*  GFRAA >90 >90 >90 >90  LIVER FUNCTION TESTS:  Recent Labs  03/12/14 1515  BILITOT 0.3  AST 21  ALT 13  ALKPHOS 98  PROT 7.0  ALBUMIN 2.3*    TUMOR MARKERS: No results for input(s): AFPTM, CEA, CA199, CHROMGRNA in the last 8760 hours.  Assessment and Plan:  Back pain Laminectomy L4-5 surgery 01/2014 and 02/2014 Worsening back pain really since then New MRI 09/2014 reveals L4-5 epidural abscess Now scheduled for epidural abscess aspiration Pt and family aware of procedure benefits and risks including but not limited to Infection; bleeding; damage to surrounding structures Agreeable to proceed Consent signed and in chart  Thank you for this interesting consult.  I greatly enjoyed meeting Derrick Sanchez and look forward to participating in their care.  Signed: Salisa Broz A 10/16/2014, 9:25 AM   I spent a total of  20 Minutes in face to face in clinical consultation, greater than 50% of which was counseling/coordinating care for L4-5 asp

## 2014-10-17 ENCOUNTER — Telehealth: Payer: Self-pay | Admitting: *Deleted

## 2014-10-17 DIAGNOSIS — G061 Intraspinal abscess and granuloma: Secondary | ICD-10-CM

## 2014-10-17 NOTE — Telephone Encounter (Signed)
Per Dr. Johnnye Sima, verbal order received for PICC placement and to start IV vancomycin per protocol (with weekly CBC, CMP, and vanc troughs) and ceftriaxone 2 gm daily for 6 weeks.  PICC order placed.  Faxed orders to [facility] for review to see if they can provide services.  PICC being placed 5/4 12:00 at [facility] (patient to arrive at 11:30). [name], RN

## 2014-10-17 NOTE — Telephone Encounter (Signed)
Orders per Dr. Johnnye Sima.

## 2014-10-17 NOTE — Telephone Encounter (Signed)
-----   Message from Campbell Riches, MD sent at 10/17/2014  9:03 AM EDT ----- Pt needs pic, start IV vanco per pharmacy protocol, ceftriaxone 2g IVPB qday, weekly CBC, CMP.rtc 2-3 weeks.

## 2014-10-18 ENCOUNTER — Other Ambulatory Visit: Payer: Self-pay | Admitting: Infectious Diseases

## 2014-10-18 ENCOUNTER — Telehealth: Payer: Self-pay | Admitting: *Deleted

## 2014-10-18 ENCOUNTER — Ambulatory Visit (HOSPITAL_COMMUNITY)
Admission: RE | Admit: 2014-10-18 | Discharge: 2014-10-18 | Disposition: A | Discharge status: Home / Self Care | Payer: PPO | Source: Ambulatory Visit | Attending: Interventional Radiology | Admitting: Interventional Radiology

## 2014-10-18 ENCOUNTER — Other Ambulatory Visit: Payer: Self-pay | Admitting: *Deleted

## 2014-10-18 DIAGNOSIS — Z452 Encounter for adjustment and management of vascular access device: Secondary | ICD-10-CM | POA: Diagnosis present

## 2014-10-18 DIAGNOSIS — G061 Intraspinal abscess and granuloma: Secondary | ICD-10-CM

## 2014-10-18 MED ORDER — HEPARIN SOD (PORK) LOCK FLUSH 100 UNIT/ML IV SOLN
500.0000 [IU] | Freq: Once | INTRAVENOUS | Status: AC
  Administered 2014-10-18: 500 [IU] via INTRAVENOUS

## 2014-10-18 MED ORDER — VANCOMYCIN HCL IN DEXTROSE 1-5 GM/200ML-% IV SOLN
1000.0000 mg | Freq: Once | INTRAVENOUS | Status: AC
  Administered 2014-10-18: 1000 mg via INTRAVENOUS
  Filled 2014-10-18: qty 200

## 2014-10-18 MED ORDER — LIDOCAINE HCL 1 % IJ SOLN
INTRAMUSCULAR | Status: AC
  Filled 2014-10-18: qty 20

## 2014-10-18 NOTE — Procedures (Signed)
Successful placement of single lumen PICC line to brachial vein. Length 44cm Tip at lower SVC/RA No complications Ready for use.  Ascencion Dike PA-C Interventional Radiology 10/18/2014 12:16 PM

## 2014-10-18 NOTE — Telephone Encounter (Signed)
Verbal order given to Joseph Art' at [facility]) to cancel ceftriaxone but give vancomycin 1000 mg one time only as per protocol for first dose with a new PICC.  Phone: [phone].  (Unable to place sign/hold order in EPIC from outpatient.) Verbal order called to [name] at [place] to stop ceftriaxone but continue vancomycin only as per protocol.   [name], RN

## 2014-10-18 NOTE — Telephone Encounter (Signed)
-----   Message from Campbell Riches, MD sent at 10/18/2014  8:55 AM EDT ----- Please stop his ceftriaxone thanks ----- Message -----    From: Lab In [place]: 10/17/2014   7:44 AM      To: Campbell Riches, MD

## 2014-10-19 ENCOUNTER — Other Ambulatory Visit (HOSPITAL_COMMUNITY): Payer: Self-pay | Admitting: Dermatology

## 2014-10-20 LAB — CULTURE, ROUTINE-ABSCESS

## 2014-10-26 ENCOUNTER — Telehealth: Payer: Self-pay | Admitting: *Deleted

## 2014-10-26 NOTE — Telephone Encounter (Signed)
Patient on vancomycin per protocol at [place] (trough = 14 on 5/8).  Patient's other labs on 5/8 with some concerning results.  results to clinic doctor's attention, verbal orders to repeat labs 5/11 per Dr. Tommy Medal.   5/2 Hemoglobin/Hematocrit = 9.6/30.8   5/8 Hemoglobin/Hematocrit  = 8.6/27.0;   BUN=36 (creatinine=1.01);   Albumin =3.1   5/11 Hemoglobin/Hematocrit = 8.3/26.9;  BUN=31 (creatinine=0.99); Albumin =2.7. Please advise. [name], RN

## 2014-10-26 NOTE — Telephone Encounter (Signed)
Spoke with Dr. Ria Comment office, the H/H is inline with his most recent labs.  Faxed them for Dr. Ria Comment review.  They will call the patient for follow up . Dr. Willey Blade [phone]

## 2014-10-26 NOTE — Telephone Encounter (Signed)
Very good. Not sure why Hgb dropping perhaps due to ongong inflammation

## 2014-10-26 NOTE — Telephone Encounter (Signed)
That vanco trough is ok, should be managed by his pharmacy.  His H/H drop is worrisome, would forward to his PCP and ask that he is seen ASAP thanks

## 2014-10-27 ENCOUNTER — Ambulatory Visit (INDEPENDENT_AMBULATORY_CARE_PROVIDER_SITE_OTHER): Payer: PPO | Admitting: Infectious Diseases

## 2014-10-27 ENCOUNTER — Encounter: Payer: Self-pay | Admitting: Infectious Diseases

## 2014-10-27 VITALS — BP 164/69 | HR 80 | Temp 97.9°F | Ht 72.0 in | Wt 150.0 lb

## 2014-10-27 DIAGNOSIS — D518 Other vitamin B12 deficiency anemias: Secondary | ICD-10-CM

## 2014-10-27 DIAGNOSIS — G061 Intraspinal abscess and granuloma: Secondary | ICD-10-CM

## 2014-10-27 NOTE — Assessment & Plan Note (Signed)
He is doing well on vancomycin.  Will f/u his [facility]) Will change his PIC dressing.  He will have repeat MRI and neurosurgery visit in 1 month.  Will see him after MRI, make decisions regarding prolong PO at that point.

## 2014-10-27 NOTE — Progress Notes (Signed)
   Subjective:    Patient ID: Derrick Sanchez, male    DOB: 11-08-1930, 81 y.o.   MRN: [id number]  HPI 81 yo M with hx of L4-5 decompression with prosthesis on 02-06-14. He did well post-op and was d/c on 8-26.  His course was further complicated by a fall and L greater trochanter fracture 9-14. He was d/c home on 9-17 with medical mgmt.  He returned on 03-12-14 with worsening back and MRI that was highly suggestive of post-operative infection, osteomyelitis, and epidural abscess. He was afebrile and had WBC that was normal. He had ESR 56. CRP 5. 2.  He underwent debridement/re-exploration 9-27. He was started on vancomycin and ceftriaxone post-operatively, ceftriaxone then changed to cefepime.  His operative Cx were (-). He was d/c home 9-30 with vanco/ceftriaxone/rifampin. He completed only 4 weeks of this as he had issues with increased Cr.  He had f/u CT scan of spine on 05-17-14:  1. Sequelae of discitis osteomyelitis at L4-L5 with endplate erosions and loosening of pedicle screws at both levels, the right L4 screw has breeched the superior right L4 endplate. Nondisplaced right pedicle fracture suspected and may be subacute. No arthrodesis. 2. Nonetheless, underlying L4-L5 anterolisthesis has not significantly changed. The thecal sac is not well delineated at this level. 3. Multifactorial spinal stenosis at L3-L4 appears stable since September. He has had more back pain and has since f/u with [name].  He had MRI 4-27 at [place] office:  L4-5 chronic/progressive osteo/disctis.   Now with abscess surrounding intervertebral cage new since 2015.  He was sent to IR and had aspirate which grew MRSE. He was started on vancomycin via PIC (started 5-4). His course was complicated by dropping hgb, he was seen by his PCP for this 5-11.   He has been feeling well. Has continued back pain. No problems with his PIC line.  Has repeat MRI planned for 1 month with his PCP.  Review of Systems    Constitutional: Positive for chills. Negative for fever.  Gastrointestinal: Negative for diarrhea and constipation.  Genitourinary: Negative for difficulty urinating.  Musculoskeletal: Positive for back pain.   States his chills are normal for him. Not daily.      Objective:   Physical Exam  Constitutional: He appears well-developed and well-nourished.  HENT:  Mouth/Throat: No oropharyngeal exudate.  Eyes: EOM are normal. Pupils are equal, round, and reactive to light.  Neck: Neck supple.  Cardiovascular: Normal rate, regular rhythm and normal heart sounds.   Pulmonary/Chest: Effort normal and breath sounds normal.  Abdominal: Soft. Bowel sounds are normal. He exhibits no distension. There is no tenderness.  Musculoskeletal:       Arms: Lymphadenopathy:    He has no cervical adenopathy.       Assessment & Plan:

## 2014-10-27 NOTE — Assessment & Plan Note (Signed)
Appreciate Dr Ria Comment f/u.

## 2014-11-01 ENCOUNTER — Telehealth: Payer: Self-pay | Admitting: *Deleted

## 2014-11-01 NOTE — Telephone Encounter (Signed)
Vancomycin Trough = 25.2.  [facility] to adjust dose per Amy, [facility] Pharmacist.

## 2014-11-07 ENCOUNTER — Telehealth: Payer: Self-pay | Admitting: *Deleted

## 2014-11-07 NOTE — Telephone Encounter (Signed)
Patient called, requesting Dr. Johnnye Sima to call him back. He states that treatment is not working, he has no relief, and he feels he is worse than 6 months ago. He has not been able to get help from his other physicians. He is asking Dr. Johnnye Sima to "do something" to make this situation better. Please contact patient at [phone] [name], RN

## 2014-11-10 ENCOUNTER — Ambulatory Visit
Admission: RE | Admit: 2014-11-10 | Discharge: 2014-11-10 | Disposition: A | Discharge status: Home / Self Care | Payer: PPO | Source: Ambulatory Visit | Attending: Neurosurgery | Admitting: Neurosurgery

## 2014-11-10 ENCOUNTER — Other Ambulatory Visit: Payer: Self-pay | Admitting: Neurosurgery

## 2014-11-10 DIAGNOSIS — M5416 Radiculopathy, lumbar region: Secondary | ICD-10-CM

## 2014-11-10 LAB — FUNGUS CULTURE W SMEAR: Fungal Smear: NONE SEEN

## 2014-11-14 ENCOUNTER — Other Ambulatory Visit: Payer: Self-pay | Admitting: Neurosurgery

## 2014-11-17 NOTE — Pre-Procedure Instructions (Signed)
    Derrick Sanchez  11/17/2014       Your procedure is scheduled on Thursday, June 9.  Report to [facility] Admitting at 5:30 A.M.   Call this number if you have problems the morning of surgery:  [phone]                For any other questions, please call [phone], Monday - Friday 8 AM - 4 PM.   Remember:  Do not eat food or drink liquids after midnight.  Take these medicines the morning of surgery with A SIP OF WATER :gabapentin (NEURONTIN), pramipexole (MIRAPEX).                   Take if needed:oxyCODONE-acetaminophen (PERCOCET/ROXICET).                     Stop taking Vitamins, do not take Herbal Products, Aspirin or Aspirin products, Ibuprofen (Advil) or Naproxen (Aleve).   Do not wear jewelry, make-up or nail polish.  Do not wear lotions, powders, or perfumes.               Men may shave face and neck.  Do not bring valuables to the hospital.  [facility] is not responsible for any belongings or valuables.  Contacts, dentures or bridgework may not be worn into surgery.  Leave your suitcase in the car.  After surgery it may be brought to your room.  For patients admitted to the hospital, discharge time will be determined by your treatment team.  Patients discharged the day of surgery will not be allowed to drive home.   Name and phone number of your driver:   -  Special instructions:  Review  [facility] - Preparing For Surgery.  Please read over the following fact sheets that you were given. Pain Booklet, Coughing and Deep Breathing and Surgical Site Infection Prevention

## 2014-11-19 ENCOUNTER — Other Ambulatory Visit (HOSPITAL_COMMUNITY)
Admission: RE | Admit: 2014-11-19 | Discharge: 2014-11-19 | Disposition: A | Discharge status: Home / Self Care | Payer: PPO | Source: Ambulatory Visit | Attending: Neurosurgery | Admitting: Neurosurgery

## 2014-11-19 DIAGNOSIS — Z5181 Encounter for therapeutic drug level monitoring: Secondary | ICD-10-CM | POA: Insufficient documentation

## 2014-11-19 LAB — COMPREHENSIVE METABOLIC PANEL
ALT: 13 U/L — ABNORMAL LOW (ref 17–63)
AST: 21 U/L (ref 15–41)
Albumin: 2.8 g/dL — ABNORMAL LOW (ref 3.5–5.0)
Alkaline Phosphatase: 71 U/L (ref 38–126)
Anion gap: 8 (ref 5–15)
BUN: 30 mg/dL — ABNORMAL HIGH (ref 6–20)
CO2: 21 mmol/L — ABNORMAL LOW (ref 22–32)
Calcium: 8.3 mg/dL — ABNORMAL LOW (ref 8.9–10.3)
Chloride: 107 mmol/L (ref 101–111)
Creatinine, Ser: 1.05 mg/dL (ref 0.61–1.24)
GFR calc Af Amer: >60 mL/min (ref >60)
GFR calc non Af Amer: >60 mL/min (ref >60)
Glucose, Bld: 98 mg/dL (ref 65–99)
Potassium: 4 mmol/L (ref 3.5–5.1)
Sodium: 136 mmol/L (ref 135–145)
Total Bilirubin: 0.4 mg/dL (ref 0.3–1.2)
Total Protein: 6.8 g/dL (ref 6.5–8.1)

## 2014-11-19 LAB — VANCOMYCIN, TROUGH: Vancomycin Tr: 12 ug/mL (ref 10.0–20.0)

## 2014-11-19 LAB — CBC
HCT: 27.3 % — ABNORMAL LOW (ref 39.0–52.0)
Hemoglobin: 8.6 g/dL — ABNORMAL LOW (ref 13.0–17.0)
MCH: 27.7 pg (ref 26.0–34.0)
MCHC: 31.5 g/dL (ref 30.0–36.0)
MCV: 88.1 fL (ref 78.0–100.0)
Platelets: 191 10*3/uL (ref 150–400)
RBC: 3.1 MIL/uL — ABNORMAL LOW (ref 4.22–5.81)
RDW: 15 % (ref 11.5–15.5)
WBC: 5.2 10*3/uL (ref 4.0–10.5)

## 2014-11-20 ENCOUNTER — Encounter (HOSPITAL_COMMUNITY)
Admission: RE | Admit: 2014-11-20 | Discharge: 2014-11-20 | Disposition: A | Discharge status: Home / Self Care | Payer: PPO | Source: Ambulatory Visit | Attending: Neurosurgery | Admitting: Neurosurgery

## 2014-11-20 ENCOUNTER — Encounter (HOSPITAL_COMMUNITY): Payer: Self-pay

## 2014-11-20 LAB — SURGICAL PCR SCREEN
MRSA, PCR: NEGATIVE
Staphylococcus aureus: NEGATIVE

## 2014-11-20 LAB — PREPARE RBC (CROSSMATCH)

## 2014-11-20 NOTE — Progress Notes (Signed)
Spoke to Jeralyn Bennett and requested that MD sign orders

## 2014-11-20 NOTE — Progress Notes (Signed)
Patient denies CP, Shob, denies cardiology visit. PCP Dr. [name] in [place], [place]

## 2014-11-21 NOTE — Progress Notes (Signed)
Anesthesia Chart Review: Patient is an 81 year old male posted for removal of lumbar posterior instrumentation on 11/23/14 by Dr. Sherwood Gambler.  He is s/p L4-5 decompression, PLIF on 02/06/14. His post-operative course was complicated by a fall at home sustaining a left greater trochanter fracture seen on CT but not on xray.  Non-operative management was recommended by orthopedics. Initial CT of the lumbar spine showed no abnormality, but an MRI was done on 03/13/15 due to worsening pain and showed findings worrisome for post-operative infection with vertebral osteomyelitis.  He was taken to the OR that day for I&D and started on IV antibiotic therapy.  ID was consulted.  Intraoperative cultures were negative but MRI at Dr. Donnella Bi office on 10/11/14 showed changes concerning for progressive osteomyelitis/discitis and an abscess surrounding intervertebral cage that was aspirated by IR and grew MRSE. Patient now is at home with a PICC (started 10/18/14) and IV vancomycin.  He has had issues with anemia since his infection which has been followed by his PCP Dr. [name].  Other history includes non-smoker, OSA without CPAP, asthma, hyperparathyroidism s/p parathyroidectomy, RLS, fall risk, GERD, diverticulosis, glaucoma, esophogeal stricture, hiatal hernia, HTN, cataract extraction, arthritis, left TKA, right THA. He has seen GI Dr. Verl Blalock back in 05/2007 for follow-up of polyps and iron-deficiency anemia and had an unremarkable EGD and colonoscopy.   Meds include Lumigan, 65 Fe, B-complex vitamin, losartan, Percocet, Mirapex.  PAT Vitals: T 36.8 C, HR 68, BP 136/63, RR 20, O2 sat 100%.  EKG on 02/27/14 showed SR, probable LAE, borderline IVCD, prolonged QT interval. Overall, I think his EKG is stable when compared to his 11/22/13 tracing.  11/10/14 CT L-spine: IMPRESSION: 1. Progressive bone destruction compatible with osteomyelitis at L4-5. The findings are similar to the MRI of 10/11/2014. There is  evidence for infection at L3 with inferior endplate bone destruction surrounded by sclerosis. 2. Progressive foraminal stenosis at L4-5. 3. Progressive broad-based disc protrusion and central stenosis at L2-3. 4. Mild foraminal narrowing bilaterally at L2-3. 5. Facet hypertrophy in shallow central disc protrusion at L5-S1 without significant stenosis.  CXR on 02/01/14 showed: There is a 1.2 x 1.1 cm nodular opacity in the right upper lobe slightly lateral to the superior right hilum. Advise noncontrast enhanced chest CT to further assess. Underlying emphysematous change with scattered areas of scarring. No edema or consolidation.  Due to his abnormal CXR report, Dr. Sherwood Gambler ordered a chest CT which was done on 02/02/14 and showed: No suspicious pulmonary nodule or mass. Chest x-ray finding appears to correlate with sclerotic irregular old right rib fracture. Hyperinflation. Chronic subpleural parenchymal scarring. Remote granulomatous disease.  He had labs per [facility] on 11/19/14 (results in Epic) that showed Cr 1.05, BUN 30, glucose 98. H/H 8.6/27.3 with previously H/H 01/15/24.1 on 03/16/14 and 9.6/30.8 on 10/16/14. (Prior to his lumbar fusion 01/2014 his H/H were 12.3/37.3 but haven't been above 10/31.2 since surgery.)  He denied hematochezia, SOB, CP at PAT.  T&C for 2 Units ordered to have available if needed. I have also left a voice message with Manuela Schwartz at Dr. Donnella Bi office to ensure he feels labs are acceptable for surgery.  Patient may require a pre or intra-operative PRBC transfusion, but I will defer decision to Dr. Sherwood Gambler and/or his assigned anesthesiologist.  Anesthesiologist Dr. Jenita Seashore agrees with this plan. Further evaluation on the day of surgery.  George Hugh [facility] Short Stay Center/Anesthesiology Phone [phone] 11/21/2014 1:00 PM

## 2014-11-22 MED ORDER — CEFAZOLIN SODIUM-DEXTROSE 2-3 GM-% IV SOLR
2.0000 g | INTRAVENOUS | Status: AC
  Administered 2014-11-23: 2 g via INTRAVENOUS
  Filled 2014-11-22: qty 50

## 2014-11-23 ENCOUNTER — Inpatient Hospital Stay (HOSPITAL_COMMUNITY): Payer: PPO

## 2014-11-23 ENCOUNTER — Encounter (HOSPITAL_COMMUNITY): Payer: Self-pay | Admitting: Anesthesiology

## 2014-11-23 ENCOUNTER — Inpatient Hospital Stay (HOSPITAL_COMMUNITY)
Admission: RE | Admit: 2014-11-23 | Discharge: 2014-11-24 | DRG: 856 | Disposition: A | Discharge status: Home Health | Payer: PPO | Source: Ambulatory Visit | Attending: Neurosurgery | Admitting: Neurosurgery

## 2014-11-23 ENCOUNTER — Encounter (HOSPITAL_COMMUNITY)
Admission: RE | Disposition: A | Discharge status: Home Health | Payer: Self-pay | Source: Ambulatory Visit | Attending: Neurosurgery

## 2014-11-23 ENCOUNTER — Inpatient Hospital Stay (HOSPITAL_COMMUNITY): Payer: PPO | Admitting: Vascular Surgery

## 2014-11-23 ENCOUNTER — Inpatient Hospital Stay (HOSPITAL_COMMUNITY): Payer: PPO | Admitting: Anesthesiology

## 2014-11-23 DIAGNOSIS — M5416 Radiculopathy, lumbar region: Secondary | ICD-10-CM | POA: Diagnosis present

## 2014-11-23 DIAGNOSIS — T814XXA Infection following a procedure, initial encounter: Principal | ICD-10-CM | POA: Diagnosis present

## 2014-11-23 DIAGNOSIS — M960 Pseudarthrosis after fusion or arthrodesis: Secondary | ICD-10-CM | POA: Diagnosis present

## 2014-11-23 DIAGNOSIS — H409 Unspecified glaucoma: Secondary | ICD-10-CM | POA: Diagnosis present

## 2014-11-23 DIAGNOSIS — Z79899 Other long term (current) drug therapy: Secondary | ICD-10-CM | POA: Diagnosis not present

## 2014-11-23 DIAGNOSIS — Z96641 Presence of right artificial hip joint: Secondary | ICD-10-CM | POA: Diagnosis present

## 2014-11-23 DIAGNOSIS — J45909 Unspecified asthma, uncomplicated: Secondary | ICD-10-CM | POA: Diagnosis present

## 2014-11-23 DIAGNOSIS — Z96652 Presence of left artificial knee joint: Secondary | ICD-10-CM | POA: Diagnosis present

## 2014-11-23 DIAGNOSIS — M1990 Unspecified osteoarthritis, unspecified site: Secondary | ICD-10-CM | POA: Diagnosis present

## 2014-11-23 DIAGNOSIS — Z6820 Body mass index (BMI) 20.0-20.9, adult: Secondary | ICD-10-CM

## 2014-11-23 DIAGNOSIS — G4733 Obstructive sleep apnea (adult) (pediatric): Secondary | ICD-10-CM | POA: Diagnosis present

## 2014-11-23 DIAGNOSIS — D519 Vitamin B12 deficiency anemia, unspecified: Secondary | ICD-10-CM | POA: Diagnosis present

## 2014-11-23 DIAGNOSIS — K219 Gastro-esophageal reflux disease without esophagitis: Secondary | ICD-10-CM | POA: Diagnosis present

## 2014-11-23 DIAGNOSIS — R296 Repeated falls: Secondary | ICD-10-CM | POA: Diagnosis present

## 2014-11-23 DIAGNOSIS — Z86010 Personal history of colonic polyps: Secondary | ICD-10-CM | POA: Diagnosis not present

## 2014-11-23 DIAGNOSIS — I10 Essential (primary) hypertension: Secondary | ICD-10-CM | POA: Diagnosis present

## 2014-11-23 DIAGNOSIS — Y838 Other surgical procedures as the cause of abnormal reaction of the patient, or of later complication, without mention of misadventure at the time of the procedure: Secondary | ICD-10-CM | POA: Diagnosis present

## 2014-11-23 DIAGNOSIS — M4626 Osteomyelitis of vertebra, lumbar region: Secondary | ICD-10-CM | POA: Diagnosis present

## 2014-11-23 DIAGNOSIS — E43 Unspecified severe protein-calorie malnutrition: Secondary | ICD-10-CM | POA: Diagnosis present

## 2014-11-23 DIAGNOSIS — G2581 Restless legs syndrome: Secondary | ICD-10-CM | POA: Diagnosis present

## 2014-11-23 DIAGNOSIS — M549 Dorsalgia, unspecified: Secondary | ICD-10-CM

## 2014-11-23 DIAGNOSIS — E892 Postprocedural hypoparathyroidism: Secondary | ICD-10-CM | POA: Diagnosis present

## 2014-11-23 DIAGNOSIS — K5730 Diverticulosis of large intestine without perforation or abscess without bleeding: Secondary | ICD-10-CM | POA: Diagnosis present

## 2014-11-23 HISTORY — PX: LUMBAR LAMINECTOMY/ DECOMPRESSION WITH MET-RX: SHX5959

## 2014-11-23 SURGERY — LUMBAR LAMINECTOMY/ DECOMPRESSION WITH MET-RX
Anesthesia: General | Site: Back

## 2014-11-23 MED ORDER — GLYCOPYRROLATE 0.2 MG/ML IJ SOLN
INTRAMUSCULAR | Status: DC | PRN
  Administered 2014-11-23: 0.6 mg via INTRAVENOUS

## 2014-11-23 MED ORDER — HYDROXYZINE HCL 50 MG/ML IM SOLN
50.0000 mg | INTRAMUSCULAR | Status: DC | PRN
  Filled 2014-11-23: qty 1

## 2014-11-23 MED ORDER — SODIUM CHLORIDE 0.9 % IR SOLN
Status: DC | PRN
  Administered 2014-11-23: 500 mL

## 2014-11-23 MED ORDER — PROPOFOL 10 MG/ML IV BOLUS
INTRAVENOUS | Status: DC | PRN
  Administered 2014-11-23: 150 mg via INTRAVENOUS

## 2014-11-23 MED ORDER — MORPHINE SULFATE 4 MG/ML IJ SOLN: 4.0000 mg | INTRAMUSCULAR | Status: DC | PRN

## 2014-11-23 MED ORDER — BUPIVACAINE HCL (PF) 0.5 % IJ SOLN
INTRAMUSCULAR | Status: DC | PRN
  Administered 2014-11-23: 9 mL

## 2014-11-23 MED ORDER — ROCURONIUM BROMIDE 100 MG/10ML IV SOLN
INTRAVENOUS | Status: DC | PRN
  Administered 2014-11-23: 30 mg via INTRAVENOUS

## 2014-11-23 MED ORDER — HYDROXYZINE HCL 25 MG PO TABS: 50.0000 mg | ORAL_TABLET | ORAL | Status: DC | PRN

## 2014-11-23 MED ORDER — SODIUM CHLORIDE 0.9 % IJ SOLN: 3.0000 mL | INTRAMUSCULAR | Status: DC | PRN

## 2014-11-23 MED ORDER — ACETAMINOPHEN 650 MG RE SUPP: 650.0000 mg | RECTAL | Status: DC | PRN

## 2014-11-23 MED ORDER — SODIUM CHLORIDE 0.9 % IJ SOLN
3.0000 mL | Freq: Two times a day (BID) | INTRAMUSCULAR | Status: DC
  Administered 2014-11-23: 3 mL via INTRAVENOUS

## 2014-11-23 MED ORDER — ZOLPIDEM TARTRATE 5 MG PO TABS: 5.0000 mg | ORAL_TABLET | Freq: Every evening | ORAL | Status: DC | PRN

## 2014-11-23 MED ORDER — THROMBIN 5000 UNITS EX SOLR
CUTANEOUS | Status: DC | PRN
  Administered 2014-11-23 (×2): 5000 [IU] via TOPICAL

## 2014-11-23 MED ORDER — 0.9 % SODIUM CHLORIDE (POUR BTL) OPTIME
TOPICAL | Status: DC | PRN
  Administered 2014-11-23: 1000 mL

## 2014-11-23 MED ORDER — PHENOL 1.4 % MT LIQD: 1.0000 | OROMUCOSAL | Status: DC | PRN

## 2014-11-23 MED ORDER — LACTATED RINGERS IV SOLN
INTRAVENOUS | Status: DC | PRN
  Administered 2014-11-23: 07:00:00 via INTRAVENOUS

## 2014-11-23 MED ORDER — LOSARTAN POTASSIUM 50 MG PO TABS
50.0000 mg | ORAL_TABLET | Freq: Every day | ORAL | Status: DC
  Administered 2014-11-23: 50 mg via ORAL
  Filled 2014-11-23 (×2): qty 1

## 2014-11-23 MED ORDER — ONDANSETRON HCL 4 MG/2ML IJ SOLN
INTRAMUSCULAR | Status: DC | PRN
  Administered 2014-11-23: 4 mg via INTRAVENOUS

## 2014-11-23 MED ORDER — HYDROCODONE-ACETAMINOPHEN 5-325 MG PO TABS: 1.0000 | ORAL_TABLET | ORAL | Status: DC | PRN

## 2014-11-23 MED ORDER — ALUM & MAG HYDROXIDE-SIMETH 200-200-20 MG/5ML PO SUSP: 30.0000 mL | Freq: Four times a day (QID) | ORAL | Status: DC | PRN

## 2014-11-23 MED ORDER — OXYCODONE-ACETAMINOPHEN 5-325 MG PO TABS: 1.0000 | ORAL_TABLET | ORAL | Status: DC | PRN

## 2014-11-23 MED ORDER — MENTHOL 3 MG MT LOZG: 1.0000 | LOZENGE | OROMUCOSAL | Status: DC | PRN

## 2014-11-23 MED ORDER — NEOSTIGMINE METHYLSULFATE 10 MG/10ML IV SOLN
INTRAVENOUS | Status: DC | PRN
  Administered 2014-11-23: 3 mg via INTRAVENOUS

## 2014-11-23 MED ORDER — ONDANSETRON HCL 4 MG/2ML IJ SOLN: 4.0000 mg | Freq: Four times a day (QID) | INTRAMUSCULAR | Status: DC | PRN

## 2014-11-23 MED ORDER — ONDANSETRON HCL 4 MG/2ML IJ SOLN
4.0000 mg | Freq: Once | INTRAMUSCULAR | Status: DC | PRN
Start: 2014-11-23 — End: 2014-11-23

## 2014-11-23 MED ORDER — LIDOCAINE-EPINEPHRINE 1 %-1:100000 IJ SOLN
INTRAMUSCULAR | Status: DC | PRN
  Administered 2014-11-23: 9 mL

## 2014-11-23 MED ORDER — LIDOCAINE HCL (CARDIAC) 20 MG/ML IV SOLN
INTRAVENOUS | Status: DC | PRN
  Administered 2014-11-23: 25 mg via INTRAVENOUS

## 2014-11-23 MED ORDER — PRAMIPEXOLE DIHYDROCHLORIDE 1 MG PO TABS
2.0000 mg | ORAL_TABLET | Freq: Three times a day (TID) | ORAL | Status: DC
  Administered 2014-11-23 (×2): 2 mg via ORAL
  Filled 2014-11-23 (×5): qty 2

## 2014-11-23 MED ORDER — FENTANYL CITRATE (PF) 250 MCG/5ML IJ SOLN
INTRAMUSCULAR | Status: AC
  Filled 2014-11-23: qty 5

## 2014-11-23 MED ORDER — PROPOFOL 10 MG/ML IV BOLUS
INTRAVENOUS | Status: AC
  Filled 2014-11-23: qty 20

## 2014-11-23 MED ORDER — ACETAMINOPHEN 325 MG PO TABS: 650.0000 mg | ORAL_TABLET | ORAL | Status: DC | PRN

## 2014-11-23 MED ORDER — VANCOMYCIN HCL 10 G IV SOLR
1750.0000 mg | INTRAVENOUS | Status: DC
  Administered 2014-11-23: 1750 mg via INTRAVENOUS
  Filled 2014-11-23: qty 1750

## 2014-11-23 MED ORDER — FENTANYL CITRATE (PF) 100 MCG/2ML IJ SOLN
INTRAMUSCULAR | Status: DC | PRN
  Administered 2014-11-23: 25 ug via INTRAVENOUS
  Administered 2014-11-23: 75 ug via INTRAVENOUS

## 2014-11-23 MED ORDER — BISACODYL 10 MG RE SUPP: 10.0000 mg | Freq: Every day | RECTAL | Status: DC | PRN

## 2014-11-23 MED ORDER — FENTANYL CITRATE (PF) 100 MCG/2ML IJ SOLN: 25.0000 ug | INTRAMUSCULAR | Status: DC | PRN

## 2014-11-23 MED ORDER — ACETAMINOPHEN 10 MG/ML IV SOLN
INTRAVENOUS | Status: AC
  Administered 2014-11-23: 1000 mg via INTRAVENOUS
  Filled 2014-11-23: qty 100

## 2014-11-23 MED ORDER — GABAPENTIN 300 MG PO CAPS
600.0000 mg | ORAL_CAPSULE | Freq: Two times a day (BID) | ORAL | Status: DC
  Administered 2014-11-23: 600 mg via ORAL
  Filled 2014-11-23 (×4): qty 2

## 2014-11-23 MED ORDER — HEMOSTATIC AGENTS (NO CHARGE) OPTIME
TOPICAL | Status: DC | PRN
  Administered 2014-11-23: 1 via TOPICAL

## 2014-11-23 MED ORDER — GABAPENTIN 300 MG PO CAPS
300.0000 mg | ORAL_CAPSULE | ORAL | Status: DC
  Administered 2014-11-23: 300 mg via ORAL
  Filled 2014-11-23 (×2): qty 1

## 2014-11-23 MED ORDER — ONDANSETRON HCL 4 MG PO TABS: 4.0000 mg | ORAL_TABLET | Freq: Four times a day (QID) | ORAL | Status: DC | PRN

## 2014-11-23 MED ORDER — KCL IN DEXTROSE-NACL 20-5-0.45 MEQ/L-%-% IV SOLN
INTRAVENOUS | Status: DC
  Filled 2014-11-23 (×4): qty 1000

## 2014-11-23 MED ORDER — CYCLOBENZAPRINE HCL 10 MG PO TABS: 10.0000 mg | ORAL_TABLET | Freq: Three times a day (TID) | ORAL | Status: DC | PRN

## 2014-11-23 MED ORDER — SODIUM CHLORIDE 0.9 % IV SOLN: 250.0000 mL | INTRAVENOUS | Status: DC

## 2014-11-23 MED ORDER — SODIUM CHLORIDE 0.9 % IV SOLN: 8.0000 mg | Freq: Four times a day (QID) | INTRAVENOUS | Status: DC | PRN

## 2014-11-23 MED ORDER — POLYETHYLENE GLYCOL 3350 17 G PO PACK
17.0000 g | PACK | Freq: Every day | ORAL | Status: DC | PRN
  Filled 2014-11-23: qty 1

## 2014-11-23 SURGICAL SUPPLY — 72 items
ADH SKN CLS APL DERMABOND .7 (GAUZE/BANDAGES/DRESSINGS)
ADH SKN CLS LQ APL DERMABOND (GAUZE/BANDAGES/DRESSINGS) ×2
APL SKNCLS STERI-STRIP NONHPOA (GAUZE/BANDAGES/DRESSINGS)
BAG DECANTER FOR FLEXI CONT (MISCELLANEOUS) ×2 IMPLANT
BENZOIN TINCTURE PRP APPL 2/3 (GAUZE/BANDAGES/DRESSINGS) IMPLANT
BLADE CLIPPER SURG (BLADE) IMPLANT
BRUSH SCRUB EZ PLAIN DRY (MISCELLANEOUS) ×2 IMPLANT
BUR ACORN 6.0 ACORN (BURR) IMPLANT
BUR ACRON 5.0MM COATED (BURR) IMPLANT
BUR MATCHSTICK NEURO 3.0 LAGG (BURR) ×1 IMPLANT
CANISTER SUCT 3000ML PPV (MISCELLANEOUS) ×2 IMPLANT
CONT SPEC 4OZ CLIKSEAL STRL BL (MISCELLANEOUS) IMPLANT
DERMABOND ADHESIVE PROPEN (GAUZE/BANDAGES/DRESSINGS) ×2
DERMABOND ADVANCED (GAUZE/BANDAGES/DRESSINGS)
DERMABOND ADVANCED .7 DNX12 (GAUZE/BANDAGES/DRESSINGS) IMPLANT
DERMABOND ADVANCED .7 DNX6 (GAUZE/BANDAGES/DRESSINGS) IMPLANT
DRAPE C-ARM 42X72 X-RAY (DRAPES) ×4 IMPLANT
DRAPE LAPAROTOMY 100X72X124 (DRAPES) ×2 IMPLANT
DRAPE MICROSCOPE LEICA (MISCELLANEOUS) ×2 IMPLANT
DRAPE POUCH INSTRU U-SHP 10X18 (DRAPES) ×2 IMPLANT
DRSG EMULSION OIL 3X3 NADH (GAUZE/BANDAGES/DRESSINGS) IMPLANT
ELECT BLADE 4.0 EZ CLEAN MEGAD (MISCELLANEOUS) ×2
ELECT REM PT RETURN 9FT ADLT (ELECTROSURGICAL) ×2
ELECTRODE BLDE 4.0 EZ CLN MEGD (MISCELLANEOUS) ×1 IMPLANT
ELECTRODE REM PT RTRN 9FT ADLT (ELECTROSURGICAL) ×1 IMPLANT
GAUZE SPONGE 4X4 12PLY STRL (GAUZE/BANDAGES/DRESSINGS) IMPLANT
GAUZE SPONGE 4X4 16PLY XRAY LF (GAUZE/BANDAGES/DRESSINGS) IMPLANT
GLOVE BIO SURGEON STRL SZ8 (GLOVE) ×1 IMPLANT
GLOVE BIOGEL PI IND STRL 6.5 (GLOVE) IMPLANT
GLOVE BIOGEL PI IND STRL 8 (GLOVE) ×1 IMPLANT
GLOVE BIOGEL PI IND STRL 8.5 (GLOVE) IMPLANT
GLOVE BIOGEL PI INDICATOR 6.5 (GLOVE) ×2
GLOVE BIOGEL PI INDICATOR 8 (GLOVE) ×2
GLOVE BIOGEL PI INDICATOR 8.5 (GLOVE) ×1
GLOVE ECLIPSE 7.5 STRL STRAW (GLOVE) ×3 IMPLANT
GLOVE EXAM NITRILE LRG STRL (GLOVE) IMPLANT
GLOVE EXAM NITRILE MD LF STRL (GLOVE) IMPLANT
GLOVE EXAM NITRILE XL STR (GLOVE) IMPLANT
GLOVE EXAM NITRILE XS STR PU (GLOVE) IMPLANT
GOWN STRL REUS W/ TWL LRG LVL3 (GOWN DISPOSABLE) ×1 IMPLANT
GOWN STRL REUS W/ TWL XL LVL3 (GOWN DISPOSABLE) IMPLANT
GOWN STRL REUS W/TWL 2XL LVL3 (GOWN DISPOSABLE) IMPLANT
GOWN STRL REUS W/TWL LRG LVL3 (GOWN DISPOSABLE) ×4
GOWN STRL REUS W/TWL XL LVL3 (GOWN DISPOSABLE) ×4
KIT BASIN OR (CUSTOM PROCEDURE TRAY) ×2 IMPLANT
KIT ROOM TURNOVER OR (KITS) ×2 IMPLANT
NDL HYPO 18GX1.5 BLUNT FILL (NEEDLE) IMPLANT
NDL SPNL 18GX3.5 QUINCKE PK (NEEDLE) ×1 IMPLANT
NDL SPNL 22GX3.5 QUINCKE BK (NEEDLE) ×1 IMPLANT
NEEDLE HYPO 18GX1.5 BLUNT FILL (NEEDLE) IMPLANT
NEEDLE SPNL 18GX3.5 QUINCKE PK (NEEDLE) ×2 IMPLANT
NEEDLE SPNL 22GX3.5 QUINCKE BK (NEEDLE) ×2 IMPLANT
NS IRRIG 1000ML POUR BTL (IV SOLUTION) ×2 IMPLANT
PACK LAMINECTOMY NEURO (CUSTOM PROCEDURE TRAY) ×2 IMPLANT
PAD ARMBOARD 7.5X6 YLW CONV (MISCELLANEOUS) ×6 IMPLANT
PATTIES SURGICAL .5 X1 (DISPOSABLE) ×2 IMPLANT
RUBBERBAND STERILE (MISCELLANEOUS) ×4 IMPLANT
SPONGE LAP 4X18 X RAY DECT (DISPOSABLE) IMPLANT
SPONGE SURGIFOAM ABS GEL SZ50 (HEMOSTASIS) ×2 IMPLANT
STRIP CLOSURE SKIN 1/2X4 (GAUZE/BANDAGES/DRESSINGS) IMPLANT
SUT PROLENE 6 0 BV (SUTURE) IMPLANT
SUT VIC AB 1 CT1 18XBRD ANBCTR (SUTURE) ×1 IMPLANT
SUT VIC AB 1 CT1 8-18 (SUTURE) ×2
SUT VIC AB 2-0 CP2 18 (SUTURE) ×2 IMPLANT
SUT VIC AB 3-0 SH 8-18 (SUTURE) ×2 IMPLANT
SWAB CULTURE LIQ STUART DBL (MISCELLANEOUS) ×2 IMPLANT
SYR 20ML ECCENTRIC (SYRINGE) ×2 IMPLANT
SYR 5ML LL (SYRINGE) IMPLANT
TOWEL OR 17X24 6PK STRL BLUE (TOWEL DISPOSABLE) ×2 IMPLANT
TOWEL OR 17X26 10 PK STRL BLUE (TOWEL DISPOSABLE) ×2 IMPLANT
TUBE ANAEROBIC SPECIMEN COL (MISCELLANEOUS) ×2 IMPLANT
WATER STERILE IRR 1000ML POUR (IV SOLUTION) ×2 IMPLANT

## 2014-11-23 NOTE — Care Management Note (Signed)
  PRE-OPERATIVE DIAGNOSIS: Left lumbar radiculopathy, lumbar nonunion/pseudoarthrosis, bone resorption around lumbar pedicle screws, lumbar osteomyelitis  POST-OPERATIVE DIAGNOSIS: Left lumbar radiculopathy, lumbar nonunion/pseudoarthrosis, bone resorption around lumbar pedicle screws, lumbar osteomyelitis  PROCEDURE: Procedure(s): REMOVAL OF RADIUS LUMBAR POSTERIOR INSTRUMENTATION WITH MET-RX  SURGEON: Surgeon(s): Jovita Gamma, MD Erline Levine, MD

## 2014-11-23 NOTE — Anesthesia Procedure Notes (Signed)
Procedure Name: Intubation Date/Time: 11/23/2014 7:29 AM Performed by: Neldon Newport Pre-anesthesia Checklist: Patient being monitored, Suction available, Emergency Drugs available, Patient identified and Timeout performed Patient Re-evaluated:Patient Re-evaluated prior to inductionOxygen Delivery Method: Circle system utilized Preoxygenation: Pre-oxygenation with 100% oxygen Intubation Type: IV induction Ventilation: Mask ventilation without difficulty Laryngoscope Size: Mac and 4 Grade View: Grade I Tube type: Oral Tube size: 7.5 mm Number of attempts: 1 Placement Confirmation: positive ETCO2,  breath sounds checked- equal and bilateral and ETT inserted through vocal cords under direct vision Secured at: 23 cm Tube secured with: Tape Dental Injury: Teeth and Oropharynx as per pre-operative assessment

## 2014-11-23 NOTE — Op Note (Signed)
11/23/2014  9:00 AM  PATIENT:  Derrick Sanchez  81 y.o. male  PRE-OPERATIVE DIAGNOSIS:  Left lumbar radiculopathy, lumbar nonunion/pseudoarthrosis, bone resorption around lumbar pedicle screws, lumbar osteomyelitis  POST-OPERATIVE DIAGNOSIS:  Left lumbar radiculopathy, lumbar nonunion/pseudoarthrosis, bone resorption around lumbar pedicle screws, lumbar osteomyelitis  PROCEDURE:  Procedure(s):  REMOVAL OF RADIUS LUMBAR POSTERIOR INSTRUMENTATION WITH MET-RX  SURGEON:  Surgeon(s): Jovita Gamma, MD Erline Levine, MD  ASSISTANTS: Erline Levine, M.D.  ANESTHESIA:   general  EBL:    less than 25 mL  COUNT: Correct per nursing staff  SPECIMEN:  Source of Specimen:  Aerobic and anaerobic cultures from the pedicle screw holes on the left and right side, as well as culturing of a pedicle screw that was removed  DICTATION: Patient brought to the operating room, placed under general endotracheal anesthesia. Patient was turned to a prone position, the lumbar region was prepped with Betadine soap and solution draped in a sterile fashion. Using C-arm fluoroscopic guidance the existing posterior instrumentation was identified. Its location was marked on the skin, and the underlying skin and subcutaneous tissues were infiltrated on each side with local site with epinephrine. On each side and incision was made through the skin and subcutaneous tissue, and subsequent through the lumbodorsal fascia. On each side a K wire was passed down to the instrumentation, and then a series of dilators was passed over the K wire through the paraspinal musculature down to the posterior instrumentation. We then placed a 26 x 5 Metrix tube over the dilators and secured. Working through [employer] tube we identified the screw heads on each side. The locking caps were removed, and subsequently the rods were removed. We then removed each of the 4 screws. Aerobic and anaerobic cultures from the screw holes were obtained and one of  the 4 screws removed was sent for culture as well. We then proceeded with closure. Deep fascia was closed interrupted undyed 1 Vicryl sutures. Scarpa's fascia closed interrupted inverted 2) sutures. Subcutaneous subcuticular closed interrupted inverted 3-0 Vicryl sutures. Skin is possible with Dermabond. Following surgery the patient is to be turned back to a supine position, reversed from the anesthetic, extubated, and transferred to the recovery room for further care.  PLAN OF CARE: Admit to inpatient   PATIENT DISPOSITION:  PACU - hemodynamically stable.   Delay start of Pharmacological VTE agent (>24hrs) due to surgical blood loss or risk of bleeding:  yes

## 2014-11-23 NOTE — Progress Notes (Signed)
Filed Vitals:   11/23/14 1035 11/23/14 1050 11/23/14 1226 11/23/14 1614  BP:  157/62 150/59 158/62  Pulse:  55 55 65  Temp: 97.6 F (36.4 C) 97.5 F (36.4 C)    TempSrc:  Oral    Resp:  20 18 16   Height:      Weight:      SpO2:  98% 100% 100%    Patient resting in bed, comfortable. Left lumbar radicular pain doing better. Has been up and ambulating in the halls, using a rolling walker, with a staff. Small amount of drainage from incision, nursing staff applied a dry dressing. Patient voiding well. Given his usual dose of vancomycin by staff.  Plan: Continued to progress through postoperative recovery.  Hosie Spangle, MD 11/23/2014, 7:35 PM

## 2014-11-23 NOTE — Progress Notes (Signed)
Pt states is getting antibiotic every other day at home thru picc line.  States does not know name of antibiotic but is due today.

## 2014-11-23 NOTE — Care Management Utilization Note (Signed)
Utilization review completed by Sundiata Ferrick N. Aundra Pung, RN BSN 

## 2014-11-23 NOTE — Anesthesia Postprocedure Evaluation (Signed)
  Anesthesia Post-op Note  Patient: Derrick Sanchez  Procedure(s) Performed: Procedure(s) with comments: HARDWARE REMOVAL WITH MET-RX (N/A) - removal of lumbar posterior instrumentation  Patient Location: PACU  Anesthesia Type:General  Level of Consciousness: awake, alert  and oriented  Airway and Oxygen Therapy: Patient Spontanous Breathing and Patient connected to nasal cannula oxygen  Post-op Pain: mild  Post-op Assessment: Post-op Vital signs reviewed, Patient's Cardiovascular Status Stable, Respiratory Function Stable, Patent Airway and Pain level controlled   LLE Sensation: Full sensation   RLE Sensation: Full sensation      Post-op Vital Signs: stable  Last Vitals:  Filed Vitals:   11/23/14 1000  BP: 150/63  Pulse: 57  Temp:   Resp: 12    Complications: No apparent anesthesia complications

## 2014-11-23 NOTE — Progress Notes (Signed)
Patient refused CPAP stating that he does fine without it. Informed him to call at any time if he felt like he needed it. RT will continue to monitor.

## 2014-11-23 NOTE — Transfer of Care (Signed)
Immediate Anesthesia Transfer of Care Note  Patient: Derrick Sanchez  Procedure(s) Performed: Procedure(s) with comments: HARDWARE REMOVAL WITH MET-RX (N/A) - removal of lumbar posterior instrumentation  Patient Location: PACU  Anesthesia Type:General  Level of Consciousness: awake, alert  and oriented  Airway & Oxygen Therapy: Patient Spontanous Breathing and Patient connected to nasal cannula oxygen  Post-op Assessment: Report given to RN, Post -op Vital signs reviewed and stable and Patient moving all extremities X 4  Post vital signs: Reviewed and stable  Last Vitals:  Filed Vitals:   11/23/14 0619  BP: 155/71  Pulse: 62  Temp: 36.1 C  Resp: 18    Complications: No apparent anesthesia complications

## 2014-11-23 NOTE — H&P (Signed)
Subjective: Patient is a 81 y.o. male who is admitted for both lumbar posterior instrumentation. Patient underwent a lumbar decompression and arthrodesis in August 2015. Postoperatively he developed a suspected wound infection underwent incision and drainage by Dr. Saintclair Halsted, and subsequently was treated with antibiotics for about a month, which had to be stopped due to renal insufficiency. However is having increasing back and left lower extremity pain, laboratories showed an increased sedimentation rate and MRI scan showed evidence of osteomyelitis involving the L4-5 and L3-4 levels. He's been seen by Dr. Lita Mains in infectious disease consultation and started on vancomycin and Rocephin, which he is receiving every other day. Follow-up x-rays and CT show haloing around all of the pedicle screws, and therefore they're no longer providing any supportive stabilization, and may well be contributing to the radicular pain. The patient is therefore admitted for removal of the posterior instrumentation.   Patient Active Problem List   Diagnosis Date Noted  . Spinal epidural abscess 03/12/2014  . Protein-calorie malnutrition, severe 03/01/2014  . Frequent falls 02/27/2014  . Trochanteric fracture 02/27/2014  . Lumbar stenosis with neurogenic claudication 02/06/2014  . Stiffness of joints, not elsewhere classified, multiple sites 12/22/2012  . Postural imbalance 12/22/2012  . Hx of falling 12/22/2012  . HYPERPARATHYROIDISM UNSPECIFIED 05/03/2010  . OBSTRUCTIVE SLEEP APNEA 05/03/2010  . GLAUCOMA 05/03/2010  . ASTHMA 05/03/2010  . ANEMIA, VITAMIN B12 DEFICIENCY 06/14/2007  . RESTLESS LEG SYNDROME 06/14/2007  . GASTROESOPHAGEAL REFLUX DISEASE 06/14/2007  . DIVERTICULOSIS, COLON 05/31/2007  . POLYP, COLON 05/27/2002   Past Medical History  Diagnosis Date  . Obstructive sleep apnea (adult) (pediatric)   . Unspecified asthma(493.90)   . Hyperparathyroidism, unspecified   . Other vitamin B12 deficiency  anemia   . Restless legs syndrome (RLS)   . Esophageal reflux 2008    EGD  . Diverticulosis of colon (without mention of hemorrhage) 2008,2006,2003    Colonoscopy  . Glaucoma   . Internal hemorrhoids without mention of complication 4098    Colonoscopy  . Candidiasis of the esophagus 07-17-2000    EGD  . Stricture and stenosis of esophagus 05-27-2002    EGD  . Colon polyps 05-27-2002    Tubulovillous Adenoma-Colonoscopy  . Hiatal hernia 2002,2003,2013    EGD  . Allergy   . Arthritis   . Blood transfusion   . Cataract     bil eyes  . Hypertension   . Pneumonia     years ago  . Multiple falls     Pt falls asleep without warning and has encounter multiple falls    Past Surgical History  Procedure Laterality Date  . Parathyroidectomy    . Total knee arthroplasty Left   . Hip surgery Right   . Colonoscopy    . Cataract extraction Bilateral   . Mass excision  10/14/2011    Procedure: EXCISION MASS;  Surgeon: Wynonia Sours, MD;  Location: [facility];  Service: Orthopedics;  Laterality: Left;   left hand  . Arm surgery    . Eye surgery Bilateral     cataracts iol  . Lumbar wound debridement N/A 03/12/2014    Procedure: LUMBAR WOUND DEBRIDEMENT;  Surgeon: Elaina Hoops, MD;  Location: [facility];  Service: Neurosurgery;  Laterality: N/A;  LUMBAR WOUND DEBRIDEMENT  . Lumbar fusion  x 2  . Picc line place peripheral ([facility] hx) Left     Prescriptions prior to admission  Medication Sig Dispense Refill Last Dose  . B Complex-C (  B-COMPLEX WITH VITAMIN C) tablet Take 1 tablet by mouth daily.   11/22/2014 at Unknown time  . bimatoprost (LUMIGAN) 0.01 % SOLN Place 1 drop into both eyes at bedtime.   Past Week at Unknown time  . ferrous sulfate 325 (65 FE) MG tablet Take 325 mg by mouth daily with breakfast.   11/22/2014 at Unknown time  . gabapentin (NEURONTIN) 300 MG capsule Take 300-600 mg by mouth 3 (three) times daily. Take 2 capsules (600 mg) every morning, 1 capsule (300 mg)  at noon, 2 capsules (600 mg) at bedtime   11/23/2014 at Unknown time  . losartan (COZAAR) 50 MG tablet Take 50 mg by mouth at bedtime.    11/22/2014 at Unknown time  . oxyCODONE-acetaminophen (PERCOCET/ROXICET) 5-325 MG per tablet Take 1-2 tablets by mouth every 4 (four) hours as needed for moderate pain. (Patient taking differently: Take 1 tablet by mouth every 8 (eight) hours as needed (pain). ) 80 tablet 0 11/23/2014 at 0230  . pramipexole (MIRAPEX) 1 MG tablet Take 2 mg by mouth 3 (three) times daily.    11/22/2014 at Unknown time   No Known Allergies  History  Substance Use Topics  . Smoking status: Never Smoker   . Smokeless tobacco: Never Used  . Alcohol Use: No    Family History  Problem Relation Age of Onset  . Brain cancer      3 brothers  . Heart disease Father   . Heart disease Sister   . Colon cancer Neg Hx   . Esophageal cancer Neg Hx   . Rectal cancer Neg Hx   . Stomach cancer Neg Hx   . Leukemia Brother   . Diabetes Neg Hx   . Kidney disease Neg Hx   . Liver disease Neg Hx      Review of Systems A comprehensive review of systems was negative.  Objective: Vital signs in last 24 hours: Temp:  [97 F (36.1 C)] 97 F (36.1 C) (06/09 0619) Pulse Rate:  [62] 62 (06/09 0619) Resp:  [18] 18 (06/09 0619) BP: (155)/(71) 155/71 mmHg (06/09 0619) SpO2:  [100 %] 100 % (06/09 0619) Weight:  [68.04 kg (150 lb)] 68.04 kg (150 lb) (06/09 0615)  EXAM: Patient is an elderly white male in no acute distress. Lungs are clear to auscultation , the patient has symmetrical respiratory excursion. Heart has a regular rate and rhythm normal S1 and S2 no murmur.   Abdomen is soft nontender nondistended bowel sounds are present. Extremity examination shows no clubbing cyanosis or edema. Motor examination shows 5 over 5 strength in the lower extremities including the iliopsoas quadriceps dorsiflexor extensor hallicus  longus and plantar flexor bilaterally. Sensation is intact to pinprick in the  distal lower extremities. Reflexes are symmetrical bilaterally. No pathologic reflexes are present. Patient has a normal gait and stance.   Data Review:CBC    Component Value Date/Time   WBC 5.2 11/19/2014 0950   RBC 3.10* 11/19/2014 0950   HGB 8.6* 11/19/2014 0950   HCT 27.3* 11/19/2014 0950   PLT 191 11/19/2014 0950   MCV 88.1 11/19/2014 0950   MCH 27.7 11/19/2014 0950   MCHC 31.5 11/19/2014 0950   RDW 15.0 11/19/2014 0950   LYMPHSABS 1.0 03/16/2014 0438   MONOABS 0.4 03/16/2014 0438   EOSABS 0.3 03/16/2014 0438   BASOSABS 0.0 03/16/2014 0438  BMET    Component Value Date/Time   NA 136 11/19/2014 0950   K 4.0 11/19/2014 0950   CL 107 11/19/2014 0950   CO2 21* 11/19/2014 0950   GLUCOSE 98 11/19/2014 0950   BUN 30* 11/19/2014 0950   CREATININE 1.05 11/19/2014 0950   CALCIUM 8.3* 11/19/2014 0950   GFRNONAA >60 11/19/2014 0950   GFRAA >60 11/19/2014 0950     Assessment/Plan: Patient with postoperative infection of the lumbar spine, with resorption of bone from around the L4-5 post rotation, who is admitted now for removal of the posterior instrumentation.  I've discussed with the patient the nature of his condition, the nature the surgical procedure, the typical length of surgery, hospital stay, and overall recuperation. We discussed limitations postoperatively. I discussed risks of surgery including risks of infection, bleeding, possibly need for transfusion, the risk of nerve root dysfunction with pain, weakness, numbness, or paresthesias, or risk of dural tear and CSF leakage and possible need for further surgery, and the risk of anesthetic complications including myocardial infarction, stroke, pneumonia, and death. Understanding all this the patient does wish to proceed with surgery and is admitted for such.    Hosie Spangle, MD 11/23/2014 7:15 AM

## 2014-11-23 NOTE — Anesthesia Preprocedure Evaluation (Addendum)
Anesthesia Evaluation  Patient identified by MRN, date of birth, ID band Patient awake    Reviewed: Allergy & Precautions, H&P , NPO status , Patient's Chart, lab work & pertinent test results  Airway Mallampati: II  TM Distance: >3 FB Neck ROM: limited    Dental  (+) Missing, Partial Lower, Dental Advidsory Given   Pulmonary sleep apnea ,  breath sounds clear to auscultation        Cardiovascular hypertension, On Medications Rhythm:Regular Rate:Normal     Neuro/Psych  Neuromuscular disease    GI/Hepatic GERD-  Medicated and Controlled,  Endo/Other    Renal/GU      Musculoskeletal   Abdominal   Peds  Hematology   Anesthesia Other Findings   Reproductive/Obstetrics                            Anesthesia Physical Anesthesia Plan  ASA: III  Anesthesia Plan: General   Post-op Pain Management:    Induction: Intravenous  Airway Management Planned: Oral ETT  Additional Equipment:   Intra-op Plan:   Post-operative Plan: Extubation in OR  Informed Consent: I have reviewed the patients History and Physical, chart, labs and discussed the procedure including the risks, benefits and alternatives for the proposed anesthesia with the patient or authorized representative who has indicated his/her understanding and acceptance.   Dental Advisory Given  Plan Discussed with: Anesthesiologist, CRNA and Surgeon  Anesthesia Plan Comments:        Anesthesia Quick Evaluation

## 2014-11-24 ENCOUNTER — Encounter (HOSPITAL_COMMUNITY): Payer: Self-pay | Admitting: Neurosurgery

## 2014-11-24 NOTE — Discharge Instructions (Signed)

## 2014-11-24 NOTE — Progress Notes (Signed)
Pt given D/C instructions with verbal understanding. Pt's incision is open to air with no sign of infection. Pt's IV was removed prior to D/C. Pt D/C'd home via wheelchair @ [phone] per MD order. Pt is stable @ D/C and has no other needs at this time. Holli Humbles, RN

## 2014-11-24 NOTE — Discharge Summary (Signed)
Physician Discharge Summary  Patient ID: Derrick Sanchez MRN: [id number] DOB/AGE: Jun 12, 1931 81 y.o.  Admit date: 11/23/2014 Discharge date: 11/24/2014  Admission Diagnoses:  Left lumbar radiculopathy, lumbar nonunion/pseudoarthrosis, bone resorption around lumbar pedicle screws, lumbar osteomyelitis  Discharge Diagnoses:  Left lumbar radiculopathy, lumbar nonunion/pseudoarthrosis, bone resorption around lumbar pedicle screws, lumbar osteomyelitis Active Problems:   Lumbar radiculopathy   Discharged Condition: good  Hospital Course: Patient was admitted his lumbar postreduction rotation was removed. He has been up and ambulating with a rolling walker. His incisions are clean and dry. He has been given instructions regarding wound care activities following discharge. He is scheduled for follow-up visit with me in about 2 and half weeks and he'll let me know if he feels between now and then.  Discharge Exam: Blood pressure 153/72, pulse 79, temperature 98.7 F (37.1 C), temperature source Oral, resp. rate 18, height 6' (1.829 m), weight 68.04 kg (150 lb), SpO2 99 %.  Disposition: Home     Medication List    TAKE these medications        B-complex with vitamin C tablet  Take 1 tablet by mouth daily.     bimatoprost 0.01 % Soln  Commonly known as:  LUMIGAN  Place 1 drop into both eyes at bedtime.     ferrous sulfate 325 (65 FE) MG tablet  Take 325 mg by mouth daily with breakfast.     gabapentin 300 MG capsule  Commonly known as:  NEURONTIN  Take 300-600 mg by mouth 3 (three) times daily. Take 2 capsules (600 mg) every morning, 1 capsule (300 mg) at noon, 2 capsules (600 mg) at bedtime     losartan 50 MG tablet  Commonly known as:  COZAAR  Take 50 mg by mouth at bedtime.     oxyCODONE-acetaminophen 5-325 MG per tablet  Commonly known as:  PERCOCET/ROXICET  Take 1-2 tablets by mouth every 4 (four) hours as needed for moderate pain.     pramipexole 1 MG tablet  Commonly  known as:  MIRAPEX  Take 2 mg by mouth 3 (three) times daily.         Signed: Hosie Spangle, MD 11/24/2014, 8:38 AM

## 2014-11-25 LAB — WOUND CULTURE
Culture: NO GROWTH
Culture: NO GROWTH

## 2014-11-27 LAB — TYPE AND SCREEN
ABO/RH(D): O NEG
Antibody Screen: NEGATIVE
Unit division: 0
Unit division: 0
Weak D: POSITIVE

## 2014-11-28 LAB — ANAEROBIC CULTURE

## 2014-11-28 LAB — AFB CULTURE WITH SMEAR (NOT AT ARMC): Acid Fast Smear: NONE SEEN

## 2014-11-30 ENCOUNTER — Encounter: Payer: Self-pay | Admitting: Infectious Diseases

## 2014-11-30 ENCOUNTER — Ambulatory Visit (INDEPENDENT_AMBULATORY_CARE_PROVIDER_SITE_OTHER): Payer: PPO | Admitting: Infectious Diseases

## 2014-11-30 VITALS — BP 141/66 | HR 81 | Temp 97.9°F | Ht 72.0 in | Wt 150.0 lb

## 2014-11-30 DIAGNOSIS — G061 Intraspinal abscess and granuloma: Secondary | ICD-10-CM | POA: Diagnosis not present

## 2014-11-30 DIAGNOSIS — D518 Other vitamin B12 deficiency anemias: Secondary | ICD-10-CM | POA: Diagnosis not present

## 2014-11-30 NOTE — Progress Notes (Signed)
   Subjective:    Patient ID: Derrick Sanchez, male    DOB: 01/05/1931, 81 y.o.   MRN: [id number]  HPI 81 yo M with hx of L4-5 decompression with prosthesis on 02-06-14. He did well post-op and was d/c on 8-26.  His course was further complicated by a fall and L greater trochanter fracture 02-27-14. He was d/c home on 9-17 with medical mgmt.  He returned on 03-12-14 with worsening back and MRI that was highly suggestive of post-operative infection, osteomyelitis, and epidural abscess. He was afebrile and had WBC that was normal. He had ESR 56. CRP 5. 2.  He underwent debridement/re-exploration 9-27. He was started on vancomycin and ceftriaxone post-operatively, ceftriaxone then changed to cefepime. His operative Cx were (-). He was d/c home 9-30 with vanco/ceftriaxone/rifampin. He completed only 4 weeks of this as he had issues with increased Cr.  He had f/u CT scan of spine on 05-17-14:  1. Sequelae of discitis osteomyelitis at L4-L5 with endplate erosions and loosening of pedicle screws at both levels, the right L4 screw has breeched the superior right L4 endplate. Nondisplaced right pedicle fracture suspected and may be subacute. No arthrodesis. 2. Nonetheless, underlying L4-L5 anterolisthesis has not significantly changed. The thecal sac is not well delineated at this level. 3. Multifactorial spinal stenosis at L3-L4 appears stable since September. He has had more back pain and has since f/u with [name].  He had MRI 10-11-14 at [place] office: L4-5 chronic/progressive osteo/disctis. Now with abscess surrounding intervertebral cage new since 2015.  He was sent to IR and had aspirate which grew MRSE. He was started on vancomycin via PIC (started 5-4). His course was complicated by dropping hgb,(has had f/u with PCP).   He had removal of his hardware on 11-23-14. He has felt somewhat better since then- worse with standing and putting weight on (after he gets out of bed in the AM). His  pain rx improves his pain but does not completely resolve. Gets around with wheelchair, walker.  He had f/u MRI 11-29-14 at [name] office.  He is getting vanco qod.  No problems with his PIC line.  Today he complains of back pain, after having MRI yesterday.   Review of Systems  Constitutional: Negative for fever and chills.  Cardiovascular: Positive for leg swelling.  Gastrointestinal: Negative for diarrhea and constipation.  Genitourinary: Negative for difficulty urinating.  Neurological: Positive for numbness.       Objective:   Physical Exam  Constitutional: He appears well-developed and well-nourished.  Cardiovascular: Normal rate, regular rhythm and normal heart sounds.   Pulmonary/Chest: Effort normal and breath sounds normal.  Abdominal: Soft. Bowel sounds are normal. There is no tenderness.  Musculoskeletal: He exhibits edema.       Arms:     Assessment & Plan:

## 2014-11-30 NOTE — Assessment & Plan Note (Addendum)
Await his f/u MRI.  His Cx from 6-9 are (-). His labs from 6-13 show baseline anemia, stable Cr, vanco tr < 20.  Will plan for him to get 6 weeks of anbx post removal of hardware.  Will see him back in 5 weeks and most likely change to PO anbx at that time.

## 2014-11-30 NOTE — Assessment & Plan Note (Signed)
Greatly appreciate PCP f/u.  

## 2014-12-04 ENCOUNTER — Telehealth: Payer: Self-pay | Admitting: *Deleted

## 2014-12-04 ENCOUNTER — Encounter: Payer: Self-pay | Admitting: Gastroenterology

## 2014-12-04 ENCOUNTER — Ambulatory Visit: Payer: PPO | Admitting: Diagnostic Neuroimaging

## 2014-12-04 NOTE — Telephone Encounter (Signed)
Verbal order per Dr. Johnnye Sima given to [facility] at [place] to extend IV antibiotics for 6 weeks. Myrtis Hopping

## 2014-12-08 ENCOUNTER — Other Ambulatory Visit: Payer: Self-pay | Admitting: *Deleted

## 2014-12-08 ENCOUNTER — Telehealth: Payer: Self-pay | Admitting: *Deleted

## 2014-12-08 MED ORDER — SULFAMETHOXAZOLE-TRIMETHOPRIM 800-160 MG PO TABS: 1.0000 | ORAL_TABLET | Freq: Two times a day (BID) | ORAL | Status: DC

## 2014-12-08 NOTE — Telephone Encounter (Signed)
Ok.  Could give him Bactrim DS 2 tabs twice a day over the weekend if his creatinine is ok <1.5 (was 1.05 last checked in Epic).

## 2014-12-08 NOTE — Telephone Encounter (Signed)
Last creatinine from [facility] on 12/04/14 was 1.07 and never went above 1.09. Rx sent to pharmacy and patient notified. Will have picc replaced as soon as IR has availability. Myrtis Hopping

## 2014-12-08 NOTE — Telephone Encounter (Signed)
Nurse, Melissa with [facility] called stating patient's picc line is out and he has five more weeks of antibiotics scheduled. She has put a pressure bandage on it and unable to reach IR today for replacement. Ok to have this scheduled next week? Myrtis Hopping

## 2014-12-13 ENCOUNTER — Other Ambulatory Visit: Payer: Self-pay | Admitting: Licensed Clinical Social Worker

## 2014-12-13 ENCOUNTER — Telehealth: Payer: Self-pay | Admitting: *Deleted

## 2014-12-13 DIAGNOSIS — G061 Intraspinal abscess and granuloma: Secondary | ICD-10-CM

## 2014-12-13 NOTE — Telephone Encounter (Signed)
Patient notified of appt at [facility] IR for picc to be replaced tomorrow at 12:30 pm.

## 2014-12-14 ENCOUNTER — Ambulatory Visit (HOSPITAL_COMMUNITY)
Admission: RE | Admit: 2014-12-14 | Discharge: 2014-12-14 | Disposition: A | Discharge status: Home / Self Care | Payer: PPO | Source: Ambulatory Visit | Attending: Infectious Diseases | Admitting: Infectious Diseases

## 2014-12-14 ENCOUNTER — Other Ambulatory Visit: Payer: Self-pay | Admitting: Infectious Diseases

## 2014-12-14 DIAGNOSIS — M4646 Discitis, unspecified, lumbar region: Secondary | ICD-10-CM | POA: Diagnosis not present

## 2014-12-14 DIAGNOSIS — G061 Intraspinal abscess and granuloma: Secondary | ICD-10-CM

## 2014-12-14 DIAGNOSIS — M4626 Osteomyelitis of vertebra, lumbar region: Secondary | ICD-10-CM | POA: Insufficient documentation

## 2014-12-14 MED ORDER — IOHEXOL 300 MG/ML  SOLN
50.0000 mL | Freq: Once | INTRAMUSCULAR | Status: AC | PRN
  Administered 2014-12-14: 10 mL via INTRAVENOUS

## 2014-12-14 MED ORDER — HEPARIN SOD (PORK) LOCK FLUSH 100 UNIT/ML IV SOLN
INTRAVENOUS | Status: AC
  Filled 2014-12-14: qty 5

## 2014-12-14 MED ORDER — LIDOCAINE HCL 1 % IJ SOLN
INTRAMUSCULAR | Status: AC
  Filled 2014-12-14: qty 20

## 2014-12-14 NOTE — Procedures (Signed)
Successful placement of left basilic vein approach 42 cm single lumen PICC line with tip at the superior caval-atrial junction.  The PICC line is ready for immediate use.

## 2014-12-15 ENCOUNTER — Other Ambulatory Visit: Payer: Self-pay | Admitting: Infectious Diseases

## 2014-12-15 DIAGNOSIS — G061 Intraspinal abscess and granuloma: Secondary | ICD-10-CM

## 2014-12-17 ENCOUNTER — Other Ambulatory Visit (HOSPITAL_COMMUNITY)
Admission: RE | Admit: 2014-12-17 | Discharge: 2014-12-17 | Disposition: A | Discharge status: Home / Self Care | Payer: PPO | Source: Other Acute Inpatient Hospital | Attending: Infectious Diseases | Admitting: Infectious Diseases

## 2014-12-17 DIAGNOSIS — Z5181 Encounter for therapeutic drug level monitoring: Secondary | ICD-10-CM | POA: Diagnosis present

## 2014-12-17 LAB — COMPREHENSIVE METABOLIC PANEL
ALT: 12 U/L — ABNORMAL LOW (ref 17–63)
AST: 26 U/L (ref 15–41)
Albumin: 2.9 g/dL — ABNORMAL LOW (ref 3.5–5.0)
Alkaline Phosphatase: 80 U/L (ref 38–126)
Anion gap: 7 (ref 5–15)
BUN: 40 mg/dL — AB (ref 6–20)
CO2: 21 mmol/L — AB (ref 22–32)
Calcium: 8.3 mg/dL — ABNORMAL LOW (ref 8.9–10.3)
Chloride: 107 mmol/L (ref 101–111)
Creatinine, Ser: 1.16 mg/dL (ref 0.61–1.24)
GFR calc Af Amer: >60 mL/min (ref >60)
GFR calc non Af Amer: 57 mL/min — ABNORMAL LOW (ref >60)
Glucose, Bld: 110 mg/dL — ABNORMAL HIGH (ref 65–99)
Potassium: 4.7 mmol/L (ref 3.5–5.1)
SODIUM: 135 mmol/L (ref 135–145)
Total Bilirubin: 0.6 mg/dL (ref 0.3–1.2)
Total Protein: 7.1 g/dL (ref 6.5–8.1)

## 2014-12-17 LAB — CBC
HCT: 28.4 % — ABNORMAL LOW (ref 39.0–52.0)
HEMOGLOBIN: 8.8 g/dL — AB (ref 13.0–17.0)
MCH: 27.7 pg (ref 26.0–34.0)
MCHC: 31 g/dL (ref 30.0–36.0)
MCV: 89.3 fL (ref 78.0–100.0)
Platelets: 146 10*3/uL — ABNORMAL LOW (ref 150–400)
RBC: 3.18 MIL/uL — AB (ref 4.22–5.81)
RDW: 16.3 % — ABNORMAL HIGH (ref 11.5–15.5)
WBC: 4.7 10*3/uL (ref 4.0–10.5)

## 2014-12-17 LAB — VANCOMYCIN, TROUGH: VANCOMYCIN TR: 20 ug/mL (ref 10.0–20.0)

## 2014-12-20 ENCOUNTER — Other Ambulatory Visit: Payer: Self-pay | Admitting: Infectious Diseases

## 2014-12-20 DIAGNOSIS — G061 Intraspinal abscess and granuloma: Secondary | ICD-10-CM

## 2014-12-27 ENCOUNTER — Encounter: Payer: Self-pay | Admitting: Infectious Disease

## 2015-01-01 ENCOUNTER — Encounter: Payer: Self-pay | Admitting: Infectious Diseases

## 2015-01-04 ENCOUNTER — Other Ambulatory Visit (HOSPITAL_COMMUNITY)
Admission: AD | Admit: 2015-01-04 | Discharge: 2015-01-04 | Disposition: A | Discharge status: Home / Self Care | Payer: PPO | Source: Other Acute Inpatient Hospital | Attending: Infectious Diseases | Admitting: Infectious Diseases

## 2015-01-04 DIAGNOSIS — Z5181 Encounter for therapeutic drug level monitoring: Secondary | ICD-10-CM | POA: Diagnosis present

## 2015-01-04 DIAGNOSIS — T814XXD Infection following a procedure, subsequent encounter: Secondary | ICD-10-CM | POA: Diagnosis not present

## 2015-01-04 LAB — SEDIMENTATION RATE: SED RATE: 50 mm/h — AB (ref 0–16)

## 2015-01-04 LAB — VANCOMYCIN, TROUGH: Vancomycin Tr: 14 ug/mL (ref 10.0–20.0)

## 2015-01-05 ENCOUNTER — Telehealth: Payer: Self-pay | Admitting: *Deleted

## 2015-01-05 ENCOUNTER — Ambulatory Visit (INDEPENDENT_AMBULATORY_CARE_PROVIDER_SITE_OTHER): Payer: PPO | Admitting: Infectious Diseases

## 2015-01-05 ENCOUNTER — Encounter: Payer: Self-pay | Admitting: Infectious Diseases

## 2015-01-05 VITALS — BP 167/71 | HR 70 | Temp 97.6°F | Wt 147.0 lb

## 2015-01-05 DIAGNOSIS — I10 Essential (primary) hypertension: Secondary | ICD-10-CM | POA: Insufficient documentation

## 2015-01-05 DIAGNOSIS — D518 Other vitamin B12 deficiency anemias: Secondary | ICD-10-CM | POA: Diagnosis not present

## 2015-01-05 DIAGNOSIS — G061 Intraspinal abscess and granuloma: Secondary | ICD-10-CM

## 2015-01-05 NOTE — Telephone Encounter (Signed)
Verbal order given to [facility] at [facility] per Dr. Johnnye Sima to add cedtriaxone 2 grams daily through next office visit on 02/05/15. Will notify of end date. Office note faxed to Dr. Sherwood Gambler.

## 2015-01-05 NOTE — Assessment & Plan Note (Signed)
He will f/u with his PCP 

## 2015-01-05 NOTE — Telephone Encounter (Signed)
Thanks, I spoke with [facility] as well

## 2015-01-05 NOTE — Assessment & Plan Note (Signed)
Has been stable, last h/h 9.2/28.5 (01-01-15) Appreciate PCP f/u.

## 2015-01-05 NOTE — Assessment & Plan Note (Signed)
Not clear why he still has elevated ESR and significant pain.  Could be that his previous aspirate was skin contaminant and not reflective of true cx. Will add ceftraixone 2g ivpb qday.  Would repeat his MRI. Will see him back in ~1 month.

## 2015-01-05 NOTE — Progress Notes (Signed)
Subjective:    Patient ID: Derrick Sanchez, male    DOB: 07/13/1930, 81 y.o.   MRN: [id number]  HPI 81 yo M with hx of L4-5 decompression with prosthesis on 02-06-14. He did well post-op and was d/c on 8-26.  His course was further complicated by a fall and L greater trochanter fracture 02-27-14. He was d/c home on 9-17 with medical mgmt.  He returned on 03-12-14 with worsening back and MRI that was highly suggestive of post-operative infection, osteomyelitis, and epidural abscess. He was afebrile and had WBC that was normal. He had ESR 56. CRP 5. 2.  He underwent debridement/re-exploration 9-27. He was started on vancomycin and ceftriaxone post-operatively, ceftriaxone then changed to cefepime. His operative Cx were (-). He was d/c home 9-30 with vanco/ceftriaxone/rifampin. He completed only 4 weeks of this as he had issues with increased Cr.  He had f/u CT scan of spine on 05-17-14:  1. Sequelae of discitis osteomyelitis at L4-L5 with endplate erosions and loosening of pedicle screws at both levels, the right L4 screw has breeched the superior right L4 endplate. Nondisplaced right pedicle fracture suspected and may be subacute. No arthrodesis. 2. Nonetheless, underlying L4-L5 anterolisthesis has not significantly changed. The thecal sac is not well delineated at this level. 3. Multifactorial spinal stenosis at L3-L4 appears stable since September. He has had more back pain and has since f/u with Dr Nudleman.  He had MRI 10-11-14 at [facility] office: L4-5 chronic/progressive osteo/disctis. Now with abscess surrounding intervertebral cage new since 2015.  He was sent to IR and had aspirate which grew MRSE. He was started on vancomycin via PIC (started 5-4). His course was complicated by dropping hgb,(has had f/u with PCP).   He had removal of his hardware on 11-23-14. His Cx at that time were (-).  He had f/u MRI 11-29-14 at Dr Nudelman's office.  He is getting vanco qod.  He had ID f/u  on 6-16 and was planned to continue on vanco to the end of July (6 weeks from hardware out).  Had neurosurgery f/u 7-20 and had x-rays, which he felt were unchanged.  He has had persistent pain, taking percocet, neurontin.  His ESR on 7-21 was 50.  No problems with his PIC line.   Review of Systems Normal BM, nl urine, numbness in hands over last few days, numbness in feet is ongoing for years, no f/c.     Objective:   Physical Exam  Constitutional: He appears well-developed and well-nourished.  Cardiovascular: Normal rate, regular rhythm and normal heart sounds.   Pulmonary/Chest: Effort normal and breath sounds normal.  Abdominal: Soft. Bowel sounds are normal. There is no tenderness.  Musculoskeletal: He exhibits edema.       Arms: 3+ at ankles/feet.        Assessment & Plan:   

## 2015-01-08 ENCOUNTER — Encounter: Payer: Self-pay | Admitting: *Deleted

## 2015-01-08 ENCOUNTER — Other Ambulatory Visit (HOSPITAL_COMMUNITY)
Admission: RE | Admit: 2015-01-08 | Discharge: 2015-01-08 | Disposition: A | Discharge status: Home / Self Care | Payer: PPO | Source: Skilled Nursing Facility | Attending: Infectious Diseases | Admitting: Infectious Diseases

## 2015-01-08 DIAGNOSIS — Z5181 Encounter for therapeutic drug level monitoring: Secondary | ICD-10-CM | POA: Insufficient documentation

## 2015-01-08 LAB — COMPREHENSIVE METABOLIC PANEL
ALK PHOS: 65 U/L (ref 38–126)
ALT: 13 U/L — AB (ref 17–63)
AST: 25 U/L (ref 15–41)
Albumin: 3 g/dL — ABNORMAL LOW (ref 3.5–5.0)
Anion gap: 7 (ref 5–15)
BUN: 27 mg/dL — AB (ref 6–20)
CO2: 24 mmol/L (ref 22–32)
Calcium: 8.4 mg/dL — ABNORMAL LOW (ref 8.9–10.3)
Chloride: 106 mmol/L (ref 101–111)
Creatinine, Ser: 1.02 mg/dL (ref 0.61–1.24)
GFR calc Af Amer: >60 mL/min (ref >60)
GFR calc non Af Amer: >60 mL/min (ref >60)
Glucose, Bld: 90 mg/dL (ref 65–99)
Potassium: 3.8 mmol/L (ref 3.5–5.1)
Sodium: 137 mmol/L (ref 135–145)
Total Bilirubin: 0.5 mg/dL (ref 0.3–1.2)
Total Protein: 7.4 g/dL (ref 6.5–8.1)

## 2015-01-08 LAB — CBC WITH DIFFERENTIAL/PLATELET
BASOS PCT: 0 % (ref 0–1)
Basophils Absolute: 0 10*3/uL (ref 0.0–0.1)
EOS PCT: 9 % — AB (ref 0–5)
Eosinophils Absolute: 0.4 10*3/uL (ref 0.0–0.7)
HCT: 29.1 % — ABNORMAL LOW (ref 39.0–52.0)
HEMOGLOBIN: 9.2 g/dL — AB (ref 13.0–17.0)
Lymphocytes Relative: 17 % (ref 12–46)
Lymphs Abs: 0.8 10*3/uL (ref 0.7–4.0)
MCH: 28.5 pg (ref 26.0–34.0)
MCHC: 31.6 g/dL (ref 30.0–36.0)
MCV: 90.1 fL (ref 78.0–100.0)
MONOS PCT: 12 % (ref 3–12)
Monocytes Absolute: 0.6 10*3/uL (ref 0.1–1.0)
NEUTROS ABS: 3 10*3/uL (ref 1.7–7.7)
Neutrophils Relative %: 62 % (ref 43–77)
PLATELETS: 142 10*3/uL — AB (ref 150–400)
RBC: 3.23 MIL/uL — ABNORMAL LOW (ref 4.22–5.81)
RDW: 16.5 % — ABNORMAL HIGH (ref 11.5–15.5)
WBC: 4.9 10*3/uL (ref 4.0–10.5)

## 2015-01-08 LAB — VANCOMYCIN, TROUGH: VANCOMYCIN TR: 15 ug/mL (ref 10.0–20.0)

## 2015-01-10 ENCOUNTER — Ambulatory Visit (INDEPENDENT_AMBULATORY_CARE_PROVIDER_SITE_OTHER): Payer: PPO | Admitting: Diagnostic Neuroimaging

## 2015-01-10 ENCOUNTER — Encounter: Payer: Self-pay | Admitting: Diagnostic Neuroimaging

## 2015-01-10 VITALS — BP 142/68 | HR 65 | Ht 72.0 in | Wt 150.4 lb

## 2015-01-10 DIAGNOSIS — M5416 Radiculopathy, lumbar region: Secondary | ICD-10-CM

## 2015-01-10 DIAGNOSIS — R208 Other disturbances of skin sensation: Secondary | ICD-10-CM | POA: Diagnosis not present

## 2015-01-10 DIAGNOSIS — D518 Other vitamin B12 deficiency anemias: Secondary | ICD-10-CM

## 2015-01-10 DIAGNOSIS — G061 Intraspinal abscess and granuloma: Secondary | ICD-10-CM

## 2015-01-10 DIAGNOSIS — R200 Anesthesia of skin: Secondary | ICD-10-CM

## 2015-01-10 NOTE — Progress Notes (Signed)
[facility]  PATIENT: Derrick Sanchez DOB: 11-26-1930  REFERRING CLINICIAN: R Fagan HISTORY FROM: patient  REASON FOR VISIT: new consult    HISTORICAL  CHIEF COMPLAINT:  Chief Complaint  Patient presents with  . Numbness    rm 7, New Patient, wife - Stanton Kidney, "numbness in legs, feet swelling"    HISTORY OF PRESENT ILLNESS:   81 year old right-handed male here for evaluation of numbness and swelling in the feet. Symptoms gradually progressive over last 2-3 years. Patient is followed up with Dr. Sherwood Gambler, neurosurgeon, regarding low back surgery. Patient has had 3 low back surgeries in 2015, 2015 and 2016. Low back surgery was copy by infection.  No problems with his fingers or hands. No history of diabetes.  REVIEW OF SYSTEMS: Full 14 system review of systems performed and notable only for numbness weakness restless legs not asleep decreased appetite joint pain joint swelling cramps aching muscle constipation soaring sewing in legs weight loss moles.  ALLERGIES: No Known Allergies  HOME MEDICATIONS: Outpatient Prescriptions Prior to Visit  Medication Sig Dispense Refill  . B Complex-C (B-COMPLEX WITH VITAMIN C) tablet Take 1 tablet by mouth daily.    . bimatoprost (LUMIGAN) 0.01 % SOLN Place 1 drop into both eyes at bedtime.    . ferrous sulfate 325 (65 FE) MG tablet Take 325 mg by mouth daily with breakfast.    . gabapentin (NEURONTIN) 300 MG capsule Take 300-600 mg by mouth 3 (three) times daily. Take 2 capsules (600 mg) every morning, 1 capsule (300 mg) at noon, 2 capsules (600 mg) at bedtime    . losartan (COZAAR) 50 MG tablet Take 50 mg by mouth at bedtime.     Marland Kitchen oxyCODONE-acetaminophen (PERCOCET/ROXICET) 5-325 MG per tablet Take 1-2 tablets by mouth every 4 (four) hours as needed for moderate pain. (Patient taking differently: Take 1 tablet by mouth every 8 (eight) hours as needed (pain). ) 80 tablet 0  . pramipexole (MIRAPEX) 1 MG tablet Take 2 mg by  mouth 3 (three) times daily.     Marland Kitchen sulfamethoxazole-trimethoprim (BACTRIM DS,SEPTRA DS) 800-160 MG per tablet Take 1 tablet by mouth 2 (two) times daily. 28 tablet 0  . vancomycin 1,000 mg in sodium chloride 0.9 % 250 mL Inject 1,000 mg into the vein every other day.     No facility-administered medications prior to visit.    PAST MEDICAL HISTORY: Past Medical History  Diagnosis Date  . Obstructive sleep apnea (adult) (pediatric)   . Unspecified asthma(493.90)   . Hyperparathyroidism, unspecified   . Other vitamin B12 deficiency anemia   . Restless legs syndrome (RLS)   . Esophageal reflux 2008    EGD  . Diverticulosis of colon (without mention of hemorrhage) 2008,2006,2003    Colonoscopy  . Glaucoma   . Internal hemorrhoids without mention of complication 6387    Colonoscopy  . Candidiasis of the esophagus 07-17-2000    EGD  . Stricture and stenosis of esophagus 05-27-2002    EGD  . Colon polyps 05-27-2002    Tubulovillous Adenoma-Colonoscopy  . Hiatal hernia 2002,2003,2013    EGD  . Allergy   . Arthritis   . Blood transfusion   . Cataract     bil eyes  . Hypertension   . Pneumonia     years ago  . Multiple falls     Pt falls asleep without warning and has encounter multiple falls    PAST SURGICAL HISTORY: Past Surgical History  Procedure Laterality Date  .  Parathyroidectomy  2008  . Total knee arthroplasty Left 1998    replacement  . Hip surgery Right 1998    replacement  . Colonoscopy    . Cataract extraction Bilateral   . Mass excision  10/14/2011    Procedure: EXCISION MASS;  Surgeon: Wynonia Sours, MD;  Location: [facility];  Service: Orthopedics;  Laterality: Left;   left hand  . Arm surgery    . Eye surgery Bilateral     cataracts iol  . Lumbar wound debridement N/A 03/12/2014    Procedure: LUMBAR WOUND DEBRIDEMENT;  Surgeon: Elaina Hoops, MD;  Location: [place] [facility];  Service: Neurosurgery;  Laterality: N/A;  LUMBAR WOUND DEBRIDEMENT  .  Lumbar fusion  x 3    most recent June 2016  . Picc line place peripheral ([facility] hx) Left   . Lumbar laminectomy/ decompression with met-rx N/A 11/23/2014    Procedure: HARDWARE REMOVAL WITH MET-RX;  Surgeon: Jovita Gamma, MD;  Location: [place] [facility];  Service: Neurosurgery;  Laterality: N/A;  removal of lumbar posterior instrumentation    FAMILY HISTORY: Family History  Problem Relation Age of Onset  . Brain cancer      3 brothers  . Heart disease Father   . Heart disease Sister   . Colon cancer Neg Hx   . Esophageal cancer Neg Hx   . Rectal cancer Neg Hx   . Stomach cancer Neg Hx   . Leukemia Brother   . Diabetes Neg Hx   . Kidney disease Neg Hx   . Liver disease Neg Hx     SOCIAL HISTORY:  History   Social History  . Marital Status: Married    Spouse Name: Stanton Kidney  . Number of Children: 1  . Years of Education: 12   Occupational History  . Retired     Librarian, academic in supply room   Social History Main Topics  . Smoking status: Never Smoker   . Smokeless tobacco: Never Used  . Alcohol Use: No  . Drug Use: No  . Sexual Activity: Not on file   Other Topics Concern  . Not on file   Social History Narrative   Lives at home with wife, Stanton Kidney      Caffeine use - soda 2 a day     PHYSICAL EXAM  GENERAL EXAM/CONSTITUTIONAL: Vitals:  Filed Vitals:   01/10/15 1138  BP: 142/68  Pulse: 65  Height: 6' (1.829 m)  Weight: 150 lb 6.4 oz (68.221 kg)     Body mass index is 20.39 kg/(m^2).  Visual Acuity Screening   Right eye Left eye Both eyes  Without correction:     With correction: 20/50 20/70   Comments: Wears tri focals    Patient is in no distress; well developed, nourished and groomed; neck is supple  CARDIOVASCULAR:  Examination of carotid arteries is normal; no carotid bruits  Regular rate and rhythm, no murmurs  Examination of peripheral vascular system by observation and palpation is normal  EYES:  Ophthalmoscopic exam of optic discs and  posterior segments is normal; no papilledema or hemorrhages  MUSCULOSKELETAL:  Gait, strength, tone, movements noted in Neurologic exam below  NEUROLOGIC: MENTAL STATUS:  No flowsheet data found.  awake, alert, oriented to person, place and time  recent and remote memory intact  normal attention and concentration  language fluent, comprehension intact, naming intact,   fund of knowledge appropriate  CRANIAL NERVE:   2nd - no papilledema on fundoscopic exam  2nd, 3rd, 4th, 6th - pupils equal and reactive to light, visual fields full to confrontation, extraocular muscles intact, no nystagmus  5th - facial sensation symmetric  7th - facial strength symmetric  8th - hearing intact  9th - palate elevates symmetrically, uvula midline  11th - shoulder shrug symmetric  12th - tongue protrusion midline  MOTOR:    normal bulk and tone; BUE 4+; BLE (HF 3, KE 3, KF 3, DF 1-2); full strength in the BUE, BLE  SENSORY:   normal and symmetric to light touch; ABSENT PP AND VIB IN FEET  COORDINATION:   finger-nose-finger, fine finger movements normal  REFLEXES:   deep tendon reflexes: BUE TRACE; BLE 0  GAIT/STATION:   STOOPED POSTURE; ANTALGIC GAIT; UNSTEADY     DIAGNOSTIC DATA (LABS, IMAGING, TESTING) - I reviewed patient records, labs, notes, testing and imaging myself where available.  Lab Results  Component Value Date   WBC 4.9 01/08/2015   HGB 9.2* 01/08/2015   HCT 29.1* 01/08/2015   MCV 90.1 01/08/2015   PLT 142* 01/08/2015      Component Value Date/Time   NA 137 01/08/2015 1013   K 3.8 01/08/2015 1013   CL 106 01/08/2015 1013   CO2 24 01/08/2015 1013   GLUCOSE 90 01/08/2015 1013   BUN 27* 01/08/2015 1013   CREATININE 1.02 01/08/2015 1013   CALCIUM 8.4* 01/08/2015 1013   PROT 7.4 01/08/2015 1013   ALBUMIN 3.0* 01/08/2015 1013   AST 25 01/08/2015 1013   ALT 13* 01/08/2015 1013   ALKPHOS 65 01/08/2015 1013   BILITOT 0.5 01/08/2015 1013    GFRNONAA >60 01/08/2015 1013   GFRAA >60 01/08/2015 1013   No results found for: CHOL, HDL, LDLCALC, LDLDIRECT, TRIG, CHOLHDL No results found for: HGBA1C Lab Results  Component Value Date   VITAMINB12 400 02/27/2014   Lab Results  Component Value Date   TSH 2.720 02/27/2014    11/10/14 CT lumbar spine [I reviewed images myself and agree with interpretation. -VRP]  1. Progressive bone destruction compatible with osteomyelitis at L4-5. The findings are similar to the MRI of 10/11/2014. There is evidence for infection at L3 with inferior endplate bone destruction surrounded by sclerosis. 2. Progressive foraminal stenosis at L4-5. 3. Progressive broad-based disc protrusion and central stenosis at L2-3. 4. Mild foraminal narrowing bilaterally at L2-3. 5. Facet hypertrophy in shallow central disc protrusion at L5-S1 without significant stenosis.    ASSESSMENT AND PLAN  81 y.o. year old male here with progressive bilateral foot numbness and tingling. May represent sequelae from lumbar radiculopathies versus other neuropathy.  Dx: lumbar radiculopathies + idiopathic neuropathy  PLAN: - ask PCP to check basic neuropathy labs (B12, TSH, A1c) - advised patient and wife to conisder increased supervision / assistance at home; may need to consider home health agency, home aid or assisted living options  Return if symptoms worsen or fail to improve, for return to PCP.    Penni Bombard, MD 3/54/6568, 12:75 PM Certified in Neurology, Neurophysiology and Neuroimaging  [facility] [address] [place], [place] [address] [phone]

## 2015-01-12 ENCOUNTER — Encounter: Payer: Self-pay | Admitting: Diagnostic Neuroimaging

## 2015-01-30 ENCOUNTER — Encounter: Payer: Self-pay | Admitting: Infectious Diseases

## 2015-02-05 ENCOUNTER — Telehealth: Payer: Self-pay | Admitting: *Deleted

## 2015-02-05 ENCOUNTER — Ambulatory Visit (INDEPENDENT_AMBULATORY_CARE_PROVIDER_SITE_OTHER): Payer: PPO | Admitting: Infectious Diseases

## 2015-02-05 ENCOUNTER — Other Ambulatory Visit (HOSPITAL_COMMUNITY)
Admission: RE | Admit: 2015-02-05 | Discharge: 2015-02-05 | Disposition: A | Discharge status: Home / Self Care | Payer: PPO | Source: Other Acute Inpatient Hospital | Attending: Infectious Diseases | Admitting: Infectious Diseases

## 2015-02-05 ENCOUNTER — Encounter: Payer: Self-pay | Admitting: Infectious Diseases

## 2015-02-05 VITALS — BP 164/76 | HR 58 | Temp 97.9°F | Ht 72.0 in | Wt 149.0 lb

## 2015-02-05 DIAGNOSIS — G061 Intraspinal abscess and granuloma: Secondary | ICD-10-CM | POA: Diagnosis not present

## 2015-02-05 DIAGNOSIS — Z452 Encounter for adjustment and management of vascular access device: Secondary | ICD-10-CM | POA: Diagnosis present

## 2015-02-05 LAB — COMPREHENSIVE METABOLIC PANEL
ALBUMIN: 3 g/dL — AB (ref 3.5–5.0)
ALK PHOS: 74 U/L (ref 38–126)
ALT: 14 U/L — AB (ref 17–63)
AST: 27 U/L (ref 15–41)
Anion gap: 5 (ref 5–15)
BUN: 26 mg/dL — AB (ref 6–20)
CALCIUM: 8.3 mg/dL — AB (ref 8.9–10.3)
CHLORIDE: 107 mmol/L (ref 101–111)
CO2: 25 mmol/L (ref 22–32)
CREATININE: 0.89 mg/dL (ref 0.61–1.24)
GFR calc non Af Amer: >60 mL/min (ref >60)
GLUCOSE: 95 mg/dL (ref 65–99)
Potassium: 4 mmol/L (ref 3.5–5.1)
SODIUM: 137 mmol/L (ref 135–145)
Total Bilirubin: 0.4 mg/dL (ref 0.3–1.2)
Total Protein: 7.2 g/dL (ref 6.5–8.1)

## 2015-02-05 LAB — CBC
HCT: 30.2 % — ABNORMAL LOW (ref 39.0–52.0)
HEMOGLOBIN: 9.7 g/dL — AB (ref 13.0–17.0)
MCH: 28.9 pg (ref 26.0–34.0)
MCHC: 32.1 g/dL (ref 30.0–36.0)
MCV: 89.9 fL (ref 78.0–100.0)
Platelets: 127 10*3/uL — ABNORMAL LOW (ref 150–400)
RBC: 3.36 MIL/uL — AB (ref 4.22–5.81)
RDW: 15.2 % (ref 11.5–15.5)
WBC: 3.7 10*3/uL — AB (ref 4.0–10.5)

## 2015-02-05 LAB — C-REACTIVE PROTEIN: CRP: <0.5 mg/dL (ref <1.0)

## 2015-02-05 LAB — SEDIMENTATION RATE: Sed Rate: 43 mm/hr — ABNORMAL HIGH (ref 0–16)

## 2015-02-05 NOTE — Assessment & Plan Note (Signed)
Will add ESR and CRP to his labs today. Will get his MRi from his neurosurgery.  Will send him to [facility].  Will see him back in 1 month. 9-21

## 2015-02-05 NOTE — Telephone Encounter (Signed)
[place], asked [name] to see if ESR and CRP could be added to today's labs per Dr. Johnnye Sima. [name], RN

## 2015-02-05 NOTE — Progress Notes (Signed)
   Subjective:    Patient ID: Derrick Sanchez, male    DOB: [birth year], 81 y.o.   MRN: [id number]  HPI 81 yo M with hx of L4-5 decompression with prosthesis on 02-06-14. He did well post-op and was d/c on 8-26.  His course was further complicated by a fall and L greater trochanter fracture 02-27-14. He was d/c home on 9-17 with medical mgmt.  He returned on 03-12-14 with worsening back and MRI that was highly suggestive of post-operative infection, osteomyelitis, and epidural abscess. He was afebrile and had WBC that was normal. He had ESR 56. CRP 5. 2.  He underwent debridement/re-exploration 9-27. He was started on vancomycin and ceftriaxone post-operatively, ceftriaxone then changed to cefepime. His operative Cx were (-). He was d/c home 9-30 with vanco/ceftriaxone/rifampin. He completed only 4 weeks of this as he had issues with increased Cr.  He had f/u CT scan of spine on 05-17-14:  1. Sequelae of discitis osteomyelitis at L4-L5 with endplate erosions and loosening of pedicle screws at both levels, the right L4 screw has breeched the superior right L4 endplate. Nondisplaced right pedicle fracture suspected and may be subacute. No arthrodesis. 2. Nonetheless, underlying L4-L5 anterolisthesis has not significantly changed. The thecal sac is not well delineated at this level. 3. Multifactorial spinal stenosis at L3-L4 appears stable since September. He has had more back pain and has since f/u with [name].  He had MRI 10-11-14 at [place] office: L4-5 chronic/progressive osteo/disctis. Now with abscess surrounding intervertebral cage new since 2015.  He was sent to IR and had aspirate which grew MRSE. He was started on vancomycin via PIC (started 5-4). His course was complicated by dropping hgb,(has had f/u with PCP).   He had removal of his hardware on 11-23-14. His Cx at that time were (-).  He had f/u MRI 11-29-14 at [name] office.  He is getting vanco qod.  He had ID f/u  on 6-16 and was planned to continue on vanco to the end of July (6 weeks from hardware out).  Had neurosurgery f/u 7-20 and had x-rays, which he felt were unchanged.  He has had persistent pain, taking percocet, neurontin.  His ESR on 7-21 was 50.  Had ID f/u on 7-22 and had ceftriaxone added to his vancomycin.   He has continued pain. He is interested in going to [facility] for 2nd opinion.  No problems with his anbx.  Had repeat MRI at neurosurgery at couple of weeks ago. ss   Review of Systems See above    Objective:   Physical Exam  Constitutional: He appears well-developed and well-nourished.  HENT:  Mouth/Throat: No oropharyngeal exudate.  Eyes: EOM are normal. Pupils are equal, round, and reactive to light.  Neck: Neck supple.  Cardiovascular: Normal rate, regular rhythm and normal heart sounds.   Pulmonary/Chest: Effort normal and breath sounds normal.  Abdominal: Soft. Bowel sounds are normal. There is no tenderness.  Musculoskeletal:       Arms: Lymphadenopathy:    He has no cervical adenopathy.       Assessment & Plan:

## 2015-02-06 ENCOUNTER — Telehealth: Payer: Self-pay | Admitting: *Deleted

## 2015-02-06 LAB — VANCOMYCIN, TROUGH: Vancomycin Tr: 14 ug/mL (ref 10.0–20.0)

## 2015-02-06 NOTE — Telephone Encounter (Signed)
Verbal order per Dr. Johnnye Sima given to [place] at [facility] to extend patient's IV antibiotics until further notice. Patient has follow up with Dr. Johnnye Sima on 03/07/15. Myrtis Hopping

## 2015-02-19 ENCOUNTER — Other Ambulatory Visit (HOSPITAL_COMMUNITY)
Admission: RE | Admit: 2015-02-19 | Discharge: 2015-02-19 | Disposition: A | Discharge status: Home / Self Care | Payer: PPO | Source: Other Acute Inpatient Hospital | Attending: Infectious Diseases | Admitting: Infectious Diseases

## 2015-02-19 DIAGNOSIS — Z5181 Encounter for therapeutic drug level monitoring: Secondary | ICD-10-CM | POA: Diagnosis present

## 2015-02-19 DIAGNOSIS — G061 Intraspinal abscess and granuloma: Secondary | ICD-10-CM | POA: Diagnosis not present

## 2015-02-19 DIAGNOSIS — E43 Unspecified severe protein-calorie malnutrition: Secondary | ICD-10-CM | POA: Diagnosis not present

## 2015-02-19 LAB — COMPREHENSIVE METABOLIC PANEL WITH GFR
ALT: 14 U/L — ABNORMAL LOW (ref 17–63)
AST: 28 U/L (ref 15–41)
Albumin: 3.1 g/dL — ABNORMAL LOW (ref 3.5–5.0)
Alkaline Phosphatase: 79 U/L (ref 38–126)
Anion gap: 6 (ref 5–15)
BUN: 26 mg/dL — ABNORMAL HIGH (ref 6–20)
CO2: 24 mmol/L (ref 22–32)
Calcium: 8.2 mg/dL — ABNORMAL LOW (ref 8.9–10.3)
Chloride: 106 mmol/L (ref 101–111)
Creatinine, Ser: 0.99 mg/dL (ref 0.61–1.24)
GFR calc Af Amer: >60 mL/min
GFR calc non Af Amer: >60 mL/min
Glucose, Bld: 99 mg/dL (ref 65–99)
Potassium: 4 mmol/L (ref 3.5–5.1)
Sodium: 136 mmol/L (ref 135–145)
Total Bilirubin: 0.5 mg/dL (ref 0.3–1.2)
Total Protein: 6.9 g/dL (ref 6.5–8.1)

## 2015-02-19 LAB — CBC
HCT: 31.7 % — ABNORMAL LOW (ref 39.0–52.0)
Hemoglobin: 9.9 g/dL — ABNORMAL LOW (ref 13.0–17.0)
MCH: 28.6 pg (ref 26.0–34.0)
MCHC: 31.2 g/dL (ref 30.0–36.0)
MCV: 91.6 fL (ref 78.0–100.0)
Platelets: 138 K/uL — ABNORMAL LOW (ref 150–400)
RBC: 3.46 MIL/uL — ABNORMAL LOW (ref 4.22–5.81)
RDW: 15.8 % — ABNORMAL HIGH (ref 11.5–15.5)
WBC: 4.2 K/uL (ref 4.0–10.5)

## 2015-02-19 LAB — VANCOMYCIN, TROUGH: VANCOMYCIN TR: 16 ug/mL (ref 10.0–20.0)

## 2015-03-07 ENCOUNTER — Telehealth: Payer: Self-pay | Admitting: *Deleted

## 2015-03-07 ENCOUNTER — Encounter: Payer: Self-pay | Admitting: Infectious Diseases

## 2015-03-07 ENCOUNTER — Ambulatory Visit (INDEPENDENT_AMBULATORY_CARE_PROVIDER_SITE_OTHER): Payer: PPO | Admitting: Infectious Diseases

## 2015-03-07 VITALS — BP 153/72 | HR 60 | Temp 97.5°F | Wt 146.0 lb

## 2015-03-07 DIAGNOSIS — G061 Intraspinal abscess and granuloma: Secondary | ICD-10-CM

## 2015-03-07 DIAGNOSIS — Z23 Encounter for immunization: Secondary | ICD-10-CM

## 2015-03-07 NOTE — Assessment & Plan Note (Addendum)
He seems to be doing ok. He has received several months of IV anbx   (11-23-14?).  We will repeat his MRI of his back and if improved will pull his pic and change him to PO.  He asks for re-referral to [facility].  He has appt with Dr Sherwood Gambler in 1-2 weeks.

## 2015-03-07 NOTE — Telephone Encounter (Signed)
Requesting to know "stop" date for IV ABX.  Per Dr. Algis Downs OV note from today "We will repeat his MRI of his back and if improved will pull his pic and change him to PO."  RN shared this message with [facility] Pharmacist.  Will continue IV ABX until notified by this office.

## 2015-03-07 NOTE — Progress Notes (Signed)
Subjective:    Patient ID: Derrick Sanchez, male    DOB: 11-Apr-1931, 81 y.o.   MRN: [id number]  HPI 81 yo M with hx of L4-5 decompression with prosthesis on 02-06-14. He did well post-op and was d/c on 02-08-14.  His course was further complicated by a fall and L greater trochanter fracture 02-27-14. He was d/c home on 9-17 with medical mgmt.  He returned on 03-12-14 with worsening back and MRI that was highly suggestive of post-operative infection, osteomyelitis, and epidural abscess. He was afebrile and had WBC that was normal. He had ESR 56. CRP 5. 2.  He underwent debridement/re-exploration 9-27. He was started on vancomycin and ceftriaxone post-operatively, ceftriaxone then changed to cefepime. His operative Cx were (-). He was d/c home 9-30 with vanco/ceftriaxone/rifampin. He completed only 4 weeks of this as he had issues with increased Cr.  He had f/u CT scan of spine on 05-17-14:  1. Sequelae of discitis osteomyelitis at L4-L5 with endplate erosions and loosening of pedicle screws at both levels, the right L4 screw has breeched the superior right L4 endplate. Nondisplaced right pedicle fracture suspected and may be subacute. No arthrodesis. 2. Nonetheless, underlying L4-L5 anterolisthesis has not significantly changed. The thecal sac is not well delineated at this level. 3. Multifactorial spinal stenosis at L3-L4 appears stable since September. He has had more back pain and has since f/u with [name].  He had MRI 10-11-14 at [place] office: L4-5 chronic/progressive osteo/disctis. Now with abscess surrounding intervertebral cage new since 2015.  He was sent to IR and had aspirate which grew MRSE. He was started on vancomycin via PIC (started 5-4). His course was complicated by dropping hgb,(has had f/u with PCP).   He had removal of his hardware on 11-23-14. His Cx at that time were (-).  He had f/u MRI 11-29-14 at [name] office.  He is getting vanco qod.  He had ID  f/u on 6-16 and was planned to continue on vanco to the end of July (6 weeks from hardware out).  Had neurosurgery f/u 7-20 and had x-rays, which he felt were unchanged.  He has had persistent pain, taking percocet, neurontin.  His ESR on 7-21 was 50.  Had ID f/u on 7-22 and had ceftriaxone added to his vancomycin.  He had f/u MRI on 7-29 that showed paraspinal soft tissue infectious findings slowly improving. With resolution of some of dorsal abscess. Persistent myositis with soft tissue phlegmon. Persistent discitis/osteomyelitis L3-4, L4-5.   He has been having pain in his R hip radiating down his R leg.  States he is being eval for R knee and L hip replacement by Dr Percell Miller.  Denies f/c.  Eating poorly, lost 3#.  No problems with his PIC line. No problems with anbx that he can tell.  States he has had PIC > 4 months now.  His LE numbness persists but is better.    Review of Systems  Constitutional: Negative for fever, chills, appetite change and unexpected weight change.  Gastrointestinal: Negative for diarrhea and constipation.  Genitourinary: Negative for difficulty urinating.  Musculoskeletal: Positive for back pain.  Neurological: Positive for numbness.       Objective:   Physical Exam  Constitutional: He appears well-developed and well-nourished.  HENT:  Mouth/Throat: No oropharyngeal exudate.  Eyes: EOM are normal. Pupils are equal, round, and reactive to light.  Neck: Neck supple.  Cardiovascular: Normal rate, regular rhythm and normal heart sounds.   Pulmonary/Chest: Effort normal  and breath sounds normal.  Abdominal: Soft. Bowel sounds are normal. There is no tenderness. There is no rebound.  Musculoskeletal:  LUE PIC is clean, non-tender, no erythema, no d/c.   Lymphadenopathy:    He has no cervical adenopathy.          Assessment & Plan:

## 2015-03-09 ENCOUNTER — Other Ambulatory Visit (HOSPITAL_COMMUNITY)
Admission: RE | Admit: 2015-03-09 | Discharge: 2015-03-09 | Disposition: A | Discharge status: Home / Self Care | Payer: PPO | Source: Other Acute Inpatient Hospital | Attending: Infectious Diseases | Admitting: Infectious Diseases

## 2015-03-09 DIAGNOSIS — E538 Deficiency of other specified B group vitamins: Secondary | ICD-10-CM | POA: Diagnosis not present

## 2015-03-09 DIAGNOSIS — Z5181 Encounter for therapeutic drug level monitoring: Secondary | ICD-10-CM | POA: Diagnosis present

## 2015-03-09 LAB — COMPREHENSIVE METABOLIC PANEL
ALT: 14 U/L — ABNORMAL LOW (ref 17–63)
ANION GAP: 7 (ref 5–15)
AST: 29 U/L (ref 15–41)
Albumin: 3.4 g/dL — ABNORMAL LOW (ref 3.5–5.0)
Alkaline Phosphatase: 80 U/L (ref 38–126)
BUN: 28 mg/dL — ABNORMAL HIGH (ref 6–20)
CALCIUM: 8.2 mg/dL — AB (ref 8.9–10.3)
CHLORIDE: 108 mmol/L (ref 101–111)
CO2: 23 mmol/L (ref 22–32)
CREATININE: 0.96 mg/dL (ref 0.61–1.24)
Glucose, Bld: 81 mg/dL (ref 65–99)
Potassium: 3.7 mmol/L (ref 3.5–5.1)
SODIUM: 138 mmol/L (ref 135–145)
Total Bilirubin: 0.5 mg/dL (ref 0.3–1.2)
Total Protein: 7.2 g/dL (ref 6.5–8.1)

## 2015-03-12 ENCOUNTER — Telehealth: Payer: Self-pay | Admitting: *Deleted

## 2015-03-12 ENCOUNTER — Other Ambulatory Visit: Payer: Self-pay | Admitting: *Deleted

## 2015-03-12 DIAGNOSIS — M4626 Osteomyelitis of vertebra, lumbar region: Secondary | ICD-10-CM

## 2015-03-12 NOTE — Telephone Encounter (Signed)
Please get MRI with contrast of patients L spine to f/u previous infection thanks

## 2015-03-12 NOTE — Telephone Encounter (Signed)
Patient called, asking for Dr. Johnnye Sima to go ahead and order the MRI rather than Dr. Sherwood Gambler.  Patient would prefer for the MRI to be done at [facility].  [facility] is holding a spot for this patient on 9/29 at 7:00pm.  Ok per patient.   Please advise on MRI order so it can be authorized via insurance.  [name], RN

## 2015-03-13 NOTE — Telephone Encounter (Signed)
MRI ordered, Prior Authorization granted by insurance [id number]).  RN confirmed appointment at [facility] 9/29 7:00pm.  Pt knows to arrive early to register.

## 2015-03-15 ENCOUNTER — Ambulatory Visit (HOSPITAL_COMMUNITY)
Admission: RE | Admit: 2015-03-15 | Discharge: 2015-03-15 | Disposition: A | Discharge status: Home / Self Care | Payer: PPO | Source: Ambulatory Visit | Attending: Infectious Diseases | Admitting: Infectious Diseases

## 2015-03-15 DIAGNOSIS — M4626 Osteomyelitis of vertebra, lumbar region: Secondary | ICD-10-CM

## 2015-03-15 DIAGNOSIS — Z09 Encounter for follow-up examination after completed treatment for conditions other than malignant neoplasm: Secondary | ICD-10-CM | POA: Insufficient documentation

## 2015-03-15 DIAGNOSIS — M4646 Discitis, unspecified, lumbar region: Secondary | ICD-10-CM | POA: Diagnosis not present

## 2015-03-15 MED ORDER — GADOBENATE DIMEGLUMINE 529 MG/ML IV SOLN
14.0000 mL | Freq: Once | INTRAVENOUS | Status: AC | PRN
  Administered 2015-03-15: 14 mL via INTRAVENOUS

## 2015-03-18 ENCOUNTER — Other Ambulatory Visit (HOSPITAL_COMMUNITY)
Admission: RE | Admit: 2015-03-18 | Discharge: 2015-03-18 | Disposition: A | Discharge status: Home / Self Care | Payer: PPO | Source: Other Acute Inpatient Hospital | Attending: Infectious Diseases | Admitting: Infectious Diseases

## 2015-03-18 DIAGNOSIS — Z5181 Encounter for therapeutic drug level monitoring: Secondary | ICD-10-CM | POA: Insufficient documentation

## 2015-03-18 LAB — COMPREHENSIVE METABOLIC PANEL
ALBUMIN: 3 g/dL — AB (ref 3.5–5.0)
ALT: 16 U/L — ABNORMAL LOW (ref 17–63)
ANION GAP: 6 (ref 5–15)
AST: 29 U/L (ref 15–41)
Alkaline Phosphatase: 81 U/L (ref 38–126)
BUN: 25 mg/dL — AB (ref 6–20)
CHLORIDE: 110 mmol/L (ref 101–111)
CO2: 21 mmol/L — ABNORMAL LOW (ref 22–32)
Calcium: 7.9 mg/dL — ABNORMAL LOW (ref 8.9–10.3)
Creatinine, Ser: 0.88 mg/dL (ref 0.61–1.24)
GFR calc Af Amer: >60 mL/min (ref >60)
Glucose, Bld: 130 mg/dL — ABNORMAL HIGH (ref 65–99)
POTASSIUM: 3.8 mmol/L (ref 3.5–5.1)
Sodium: 137 mmol/L (ref 135–145)
Total Bilirubin: 0.3 mg/dL (ref 0.3–1.2)
Total Protein: 6.8 g/dL (ref 6.5–8.1)

## 2015-03-18 LAB — CBC WITH DIFFERENTIAL/PLATELET
BASOS ABS: 0 10*3/uL (ref 0.0–0.1)
BASOS PCT: 1 %
EOS PCT: 8 %
Eosinophils Absolute: 0.4 10*3/uL (ref 0.0–0.7)
HCT: 30.6 % — ABNORMAL LOW (ref 39.0–52.0)
Hemoglobin: 9.8 g/dL — ABNORMAL LOW (ref 13.0–17.0)
Lymphocytes Relative: 23 %
Lymphs Abs: 1 10*3/uL (ref 0.7–4.0)
MCH: 28.8 pg (ref 26.0–34.0)
MCHC: 32 g/dL (ref 30.0–36.0)
MCV: 90 fL (ref 78.0–100.0)
MONO ABS: 0.4 10*3/uL (ref 0.1–1.0)
Monocytes Relative: 9 %
Neutro Abs: 2.6 10*3/uL (ref 1.7–7.7)
Neutrophils Relative %: 59 %
PLATELETS: 137 10*3/uL — AB (ref 150–400)
RBC: 3.4 MIL/uL — ABNORMAL LOW (ref 4.22–5.81)
RDW: 14.6 % (ref 11.5–15.5)
WBC: 4.3 10*3/uL (ref 4.0–10.5)

## 2015-03-18 LAB — VANCOMYCIN, TROUGH: VANCOMYCIN TR: 13 ug/mL (ref 10.0–20.0)

## 2015-03-28 ENCOUNTER — Encounter: Payer: Self-pay | Admitting: Infectious Diseases

## 2015-03-28 ENCOUNTER — Ambulatory Visit (INDEPENDENT_AMBULATORY_CARE_PROVIDER_SITE_OTHER): Payer: PPO | Admitting: Infectious Diseases

## 2015-03-28 ENCOUNTER — Telehealth: Payer: Self-pay | Admitting: *Deleted

## 2015-03-28 VITALS — BP 160/80 | HR 73 | Temp 97.8°F | Wt 149.0 lb

## 2015-03-28 DIAGNOSIS — G061 Intraspinal abscess and granuloma: Secondary | ICD-10-CM | POA: Diagnosis not present

## 2015-03-28 DIAGNOSIS — I10 Essential (primary) hypertension: Secondary | ICD-10-CM | POA: Diagnosis not present

## 2015-03-28 LAB — COMPLETE METABOLIC PANEL WITH GFR
ALT: 13 U/L (ref 9–46)
AST: 20 U/L (ref 10–35)
Albumin: 3.5 g/dL — ABNORMAL LOW (ref 3.6–5.1)
Alkaline Phosphatase: 85 U/L (ref 40–115)
BILIRUBIN TOTAL: 0.2 mg/dL (ref 0.2–1.2)
BUN: 24 mg/dL (ref 7–25)
CO2: 25 mmol/L (ref 20–31)
Calcium: 8.4 mg/dL — ABNORMAL LOW (ref 8.6–10.3)
Chloride: 108 mmol/L (ref 98–110)
Creat: 0.88 mg/dL (ref 0.70–1.11)
GFR, EST NON AFRICAN AMERICAN: 80 mL/min (ref >=60)
GFR, Est African American: >89 mL/min (ref >=60)
GLUCOSE: 109 mg/dL — AB (ref 65–99)
Potassium: 4 mmol/L (ref 3.5–5.3)
SODIUM: 141 mmol/L (ref 135–146)
TOTAL PROTEIN: 7 g/dL (ref 6.1–8.1)

## 2015-03-28 LAB — C-REACTIVE PROTEIN: CRP: 0.8 mg/dL — AB (ref <0.60)

## 2015-03-28 MED ORDER — SULFAMETHOXAZOLE-TRIMETHOPRIM 400-80 MG PO TABS: 1.0000 | ORAL_TABLET | Freq: Two times a day (BID) | ORAL | Status: DC

## 2015-03-28 NOTE — Telephone Encounter (Signed)
Call from [place] at [place] regarding patient's picc line. Per Dr. Algis Downs office note and Darnelle Maffucci, CMA his picc was pulled today here in the clinic. Derrick Sanchez

## 2015-03-28 NOTE — Assessment & Plan Note (Signed)
He is asx, has home health monitoring.  Will continue to follow along with his PCP.

## 2015-03-28 NOTE — Progress Notes (Signed)
Subjective:    Patient ID: Derrick Sanchez, male    DOB: 07-Feb-1931, 81 y.o.   MRN: [id number]  HPI 81 yo M with hx of L4-5 decompression with prosthesis on 02-06-14. He did well post-op and was d/c on 02-08-14.  His course was further complicated by a fall and L greater trochanter fracture 02-27-14. He was d/c home on 9-17 with medical mgmt.  He returned on 03-12-14 with worsening back and MRI that was highly suggestive of post-operative infection, osteomyelitis, and epidural abscess. He was afebrile and had WBC that was normal. He had ESR 56. CRP 5. 2.  He underwent debridement/re-exploration 9-27. He was started on vancomycin and ceftriaxone post-operatively, ceftriaxone then changed to cefepime. His operative Cx were (-). He was d/c home 9-30 with vanco/ceftriaxone/rifampin. He completed only 4 weeks of this as he had issues with increased Cr.  He had f/u CT scan of spine on 05-17-14:  1. Sequelae of discitis osteomyelitis at L4-L5 with endplate erosions and loosening of pedicle screws at both levels, the right L4 screw has breeched the superior right L4 endplate. Nondisplaced right pedicle fracture suspected and may be subacute. No arthrodesis. 2. Nonetheless, underlying L4-L5 anterolisthesis has not significantly changed. The thecal sac is not well delineated at this level. 3. Multifactorial spinal stenosis at L3-L4 appears stable since September. He has had more back pain and has since f/u with [name].  He had MRI 10-11-14 at [place] office: L4-5 chronic/progressive osteo/disctis. Now with abscess surrounding intervertebral cage new since 2015.  He was sent to IR and had aspirate which grew MRSE. He was started on vancomycin via PIC (started 5-4).   He had removal of his hardware on 11-23-14. His Cx at that time were (-).  He had f/u MRI 11-29-14 at [name] office.   He had ID f/u on 6-16 and was planned to continue on vanco to the end of July (6 weeks from hardware  out).  Had neurosurgery f/u 7-20 and had x-rays, which he felt were unchanged.  His ESR on 7-21 was 50.  Had ID f/u on 7-22 and had ceftriaxone added to his vancomycin.  He had f/u MRI on 7-29 that showed paraspinal soft tissue infectious findings slowly improving. With resolution of some of dorsal abscess. Persistent myositis with soft tissue phlegmon. Persistent discitis/osteomyelitis L3-4, L4-5.   He had ID f/u on 9-21, he underwent repeat MRI on 9-29 showing: Some decreased paraspinal and erector spinae muscle inflammation since July, but continued confluent marrow inflammation from the L3 to the superior S1 level following the 3 level discitis osteomyelitis and subtotal destruction of the L4 vertebral body. Fluid signal persists in the L3-L4 and L4-L5 disc spaces. No epidural or paraspinal abscess. Mass effect and distortion upon the thecal sac at L4-L5 is stable to mildly increased. He completed vanco today.  Still has back pain on rising, better throughout the day.  Walking with walker,  Needs R knee and L hip replaced.   Review of Systems  Constitutional: Negative for fever, chills, appetite change and unexpected weight change.  Respiratory: Negative for shortness of breath.   Cardiovascular: Negative for chest pain.  Gastrointestinal: Negative for diarrhea and constipation.  Genitourinary: Negative for difficulty urinating.  Musculoskeletal: Positive for back pain.  Neurological: Negative for headaches.  states BP is up when he visits MD. Had home RN visit yesterday and it was "perfect"     Objective:   Physical Exam  Constitutional: He appears well-developed and  well-nourished.  HENT:  Mouth/Throat: No oropharyngeal exudate.  Eyes: EOM are normal. Pupils are equal, round, and reactive to light.  Neck: Neck supple.  Cardiovascular: Normal rate, regular rhythm and normal heart sounds.   Pulmonary/Chest: Effort normal and breath sounds normal.  Abdominal: Soft. Bowel  sounds are normal. There is no tenderness. There is no rebound.  Musculoskeletal: He exhibits no edema.  Lymphadenopathy:    He has no cervical adenopathy.      Assessment & Plan:

## 2015-03-28 NOTE — Assessment & Plan Note (Signed)
He has been on and off anbx since Sept 2015. He has been on his current regigmen for at least 3 months.  Will pull his pic today and change him to bactrim. I have asked him to call back ASAP if he has problems with this.   Will see him back in 3 months

## 2015-03-28 NOTE — Addendum Note (Signed)
Addended by: Reggy Eye on: 03/28/2015 03:34 PM   Modules accepted: Orders

## 2015-03-29 ENCOUNTER — Encounter: Payer: Self-pay | Admitting: Infectious Diseases

## 2015-03-29 LAB — SEDIMENTATION RATE: Sed Rate: 16 mm/hr (ref 0–20)

## 2015-05-02 ENCOUNTER — Encounter: Payer: Self-pay | Admitting: Infectious Diseases

## 2015-05-03 ENCOUNTER — Encounter: Payer: Self-pay | Admitting: Infectious Diseases

## 2015-06-19 ENCOUNTER — Encounter: Payer: Self-pay | Admitting: Infectious Diseases

## 2015-06-20 ENCOUNTER — Encounter: Payer: Self-pay | Admitting: Infectious Diseases

## 2015-07-02 ENCOUNTER — Ambulatory Visit: Payer: PPO | Admitting: Infectious Diseases

## 2015-07-02 DIAGNOSIS — E538 Deficiency of other specified B group vitamins: Secondary | ICD-10-CM | POA: Diagnosis not present

## 2015-07-03 DIAGNOSIS — E213 Hyperparathyroidism, unspecified: Secondary | ICD-10-CM | POA: Diagnosis not present

## 2015-07-03 DIAGNOSIS — G2581 Restless legs syndrome: Secondary | ICD-10-CM | POA: Diagnosis not present

## 2015-07-03 DIAGNOSIS — Z79899 Other long term (current) drug therapy: Secondary | ICD-10-CM | POA: Diagnosis not present

## 2015-07-06 DIAGNOSIS — Z6826 Body mass index (BMI) 26.0-26.9, adult: Secondary | ICD-10-CM | POA: Diagnosis not present

## 2015-07-06 DIAGNOSIS — E875 Hyperkalemia: Secondary | ICD-10-CM | POA: Diagnosis not present

## 2015-07-06 DIAGNOSIS — D638 Anemia in other chronic diseases classified elsewhere: Secondary | ICD-10-CM | POA: Diagnosis not present

## 2015-07-06 DIAGNOSIS — I10 Essential (primary) hypertension: Secondary | ICD-10-CM | POA: Diagnosis not present

## 2015-07-26 ENCOUNTER — Encounter: Payer: Self-pay | Admitting: Infectious Diseases

## 2015-07-30 DIAGNOSIS — E875 Hyperkalemia: Secondary | ICD-10-CM | POA: Diagnosis not present

## 2015-07-30 DIAGNOSIS — Z79899 Other long term (current) drug therapy: Secondary | ICD-10-CM | POA: Diagnosis not present

## 2015-08-03 DIAGNOSIS — E538 Deficiency of other specified B group vitamins: Secondary | ICD-10-CM | POA: Diagnosis not present

## 2015-08-06 DIAGNOSIS — E875 Hyperkalemia: Secondary | ICD-10-CM | POA: Diagnosis not present

## 2015-08-06 DIAGNOSIS — M4620 Osteomyelitis of vertebra, site unspecified: Secondary | ICD-10-CM | POA: Diagnosis not present

## 2015-08-06 DIAGNOSIS — I10 Essential (primary) hypertension: Secondary | ICD-10-CM | POA: Diagnosis not present

## 2015-08-09 DIAGNOSIS — M4646 Discitis, unspecified, lumbar region: Secondary | ICD-10-CM | POA: Diagnosis not present

## 2015-08-09 DIAGNOSIS — M8668 Other chronic osteomyelitis, other site: Secondary | ICD-10-CM | POA: Diagnosis not present

## 2015-08-09 DIAGNOSIS — M4640 Discitis, unspecified, site unspecified: Secondary | ICD-10-CM | POA: Diagnosis not present

## 2015-08-14 DIAGNOSIS — Z981 Arthrodesis status: Secondary | ICD-10-CM | POA: Diagnosis not present

## 2015-08-14 DIAGNOSIS — M8668 Other chronic osteomyelitis, other site: Secondary | ICD-10-CM | POA: Diagnosis not present

## 2015-08-14 DIAGNOSIS — R937 Abnormal findings on diagnostic imaging of other parts of musculoskeletal system: Secondary | ICD-10-CM | POA: Diagnosis not present

## 2015-08-14 DIAGNOSIS — E119 Type 2 diabetes mellitus without complications: Secondary | ICD-10-CM | POA: Diagnosis not present

## 2015-09-03 DIAGNOSIS — M8668 Other chronic osteomyelitis, other site: Secondary | ICD-10-CM | POA: Diagnosis not present

## 2015-09-03 DIAGNOSIS — M4646 Discitis, unspecified, lumbar region: Secondary | ICD-10-CM | POA: Diagnosis not present

## 2015-09-03 DIAGNOSIS — E538 Deficiency of other specified B group vitamins: Secondary | ICD-10-CM | POA: Diagnosis not present

## 2015-09-10 DIAGNOSIS — T814XXD Infection following a procedure, subsequent encounter: Secondary | ICD-10-CM | POA: Diagnosis not present

## 2015-09-10 DIAGNOSIS — M5136 Other intervertebral disc degeneration, lumbar region: Secondary | ICD-10-CM | POA: Diagnosis not present

## 2015-09-10 DIAGNOSIS — M4726 Other spondylosis with radiculopathy, lumbar region: Secondary | ICD-10-CM | POA: Diagnosis not present

## 2015-09-10 DIAGNOSIS — M4316 Spondylolisthesis, lumbar region: Secondary | ICD-10-CM | POA: Diagnosis not present

## 2015-09-10 DIAGNOSIS — M545 Low back pain: Secondary | ICD-10-CM | POA: Diagnosis not present

## 2015-10-02 ENCOUNTER — Other Ambulatory Visit: Payer: Self-pay | Admitting: Neurosurgery

## 2015-10-02 DIAGNOSIS — M4646 Discitis, unspecified, lumbar region: Secondary | ICD-10-CM

## 2015-10-08 DIAGNOSIS — E538 Deficiency of other specified B group vitamins: Secondary | ICD-10-CM | POA: Diagnosis not present

## 2015-10-11 ENCOUNTER — Ambulatory Visit
Admission: RE | Admit: 2015-10-11 | Discharge: 2015-10-11 | Disposition: A | Discharge status: Home / Self Care | Payer: PPO | Source: Ambulatory Visit | Attending: Neurosurgery | Admitting: Neurosurgery

## 2015-10-11 ENCOUNTER — Other Ambulatory Visit: Payer: Self-pay | Admitting: Neurosurgery

## 2015-10-11 VITALS — BP 119/62 | HR 57 | Temp 97.6°F | Resp 16

## 2015-10-11 DIAGNOSIS — M5416 Radiculopathy, lumbar region: Secondary | ICD-10-CM

## 2015-10-11 DIAGNOSIS — M4646 Discitis, unspecified, lumbar region: Secondary | ICD-10-CM

## 2015-10-11 MED ORDER — MIDAZOLAM HCL 2 MG/2ML IJ SOLN: 1.0000 mg | INTRAMUSCULAR | Status: DC | PRN

## 2015-10-11 MED ORDER — KETOROLAC TROMETHAMINE 30 MG/ML IJ SOLN
30.0000 mg | Freq: Once | INTRAMUSCULAR | Status: AC
  Administered 2015-10-11: 30 mg via INTRAVENOUS

## 2015-10-11 MED ORDER — FENTANYL CITRATE (PF) 100 MCG/2ML IJ SOLN
25.0000 ug | INTRAMUSCULAR | Status: DC | PRN
  Administered 2015-10-11: 25 ug via INTRAVENOUS

## 2015-10-11 MED ORDER — SODIUM CHLORIDE 0.9 % IV SOLN
Freq: Once | INTRAVENOUS | Status: AC
  Administered 2015-10-11: 10:00:00 via INTRAVENOUS

## 2015-10-11 NOTE — Discharge Instructions (Signed)
Disc Aspiration Post Procedure Discharge Instructions ° °1. May resume a regular diet and any medications that you routinely take (including pain medications). °2. No driving day of procedure. °3. Upon discharge go home and rest for at least 4 hours.  May use an ice pack as needed to injection sites on back. °4. Remove bandades later, today. ° ° ° °Please contact our office at [phone] for the following symptoms: ° °· Fever greater than 100 degrees °· Increased swelling, pain, or redness at injection site. ° ° °Thank you for visiting [facility]. ° °  ° ° ° °    °

## 2015-10-13 LAB — CELL COUNT AND DIFF, FLUID, OTHER: Total Nucleated Cell Ct: <10 cells/uL

## 2015-10-14 LAB — CSF CULTURE W GRAM STAIN: Organism ID, Bacteria: NO GROWTH

## 2015-10-14 LAB — CSF CULTURE
GRAM STAIN: NONE SEEN
GRAM STAIN: NONE SEEN

## 2015-10-16 LAB — ANAEROBIC CULTURE
GRAM STAIN: NONE SEEN
Gram Stain: NONE SEEN

## 2015-10-17 DIAGNOSIS — M8668 Other chronic osteomyelitis, other site: Secondary | ICD-10-CM | POA: Diagnosis not present

## 2015-10-17 DIAGNOSIS — M4646 Discitis, unspecified, lumbar region: Secondary | ICD-10-CM | POA: Diagnosis not present

## 2015-10-19 DIAGNOSIS — K219 Gastro-esophageal reflux disease without esophagitis: Secondary | ICD-10-CM | POA: Diagnosis not present

## 2015-10-19 DIAGNOSIS — M4646 Discitis, unspecified, lumbar region: Secondary | ICD-10-CM | POA: Diagnosis not present

## 2015-10-19 DIAGNOSIS — L0390 Cellulitis, unspecified: Secondary | ICD-10-CM | POA: Diagnosis not present

## 2015-10-19 DIAGNOSIS — R509 Fever, unspecified: Secondary | ICD-10-CM | POA: Diagnosis not present

## 2015-10-19 DIAGNOSIS — M792 Neuralgia and neuritis, unspecified: Secondary | ICD-10-CM | POA: Diagnosis not present

## 2015-10-19 DIAGNOSIS — R52 Pain, unspecified: Secondary | ICD-10-CM | POA: Diagnosis not present

## 2015-10-19 DIAGNOSIS — B961 Klebsiella pneumoniae [K. pneumoniae] as the cause of diseases classified elsewhere: Secondary | ICD-10-CM | POA: Diagnosis not present

## 2015-10-19 DIAGNOSIS — Z79899 Other long term (current) drug therapy: Secondary | ICD-10-CM | POA: Diagnosis not present

## 2015-10-19 DIAGNOSIS — A280 Pasteurellosis: Secondary | ICD-10-CM | POA: Diagnosis not present

## 2015-10-19 DIAGNOSIS — Z79891 Long term (current) use of opiate analgesic: Secondary | ICD-10-CM | POA: Diagnosis not present

## 2015-10-19 DIAGNOSIS — M4626 Osteomyelitis of vertebra, lumbar region: Secondary | ICD-10-CM | POA: Diagnosis not present

## 2015-10-19 DIAGNOSIS — M545 Low back pain: Secondary | ICD-10-CM | POA: Diagnosis not present

## 2015-10-19 DIAGNOSIS — J45909 Unspecified asthma, uncomplicated: Secondary | ICD-10-CM | POA: Diagnosis not present

## 2015-10-19 DIAGNOSIS — N390 Urinary tract infection, site not specified: Secondary | ICD-10-CM | POA: Diagnosis not present

## 2015-10-19 DIAGNOSIS — D509 Iron deficiency anemia, unspecified: Secondary | ICD-10-CM | POA: Diagnosis not present

## 2015-10-19 DIAGNOSIS — N189 Chronic kidney disease, unspecified: Secondary | ICD-10-CM | POA: Diagnosis not present

## 2015-10-19 DIAGNOSIS — J9811 Atelectasis: Secondary | ICD-10-CM | POA: Diagnosis not present

## 2015-10-19 DIAGNOSIS — I129 Hypertensive chronic kidney disease with stage 1 through stage 4 chronic kidney disease, or unspecified chronic kidney disease: Secondary | ICD-10-CM | POA: Diagnosis not present

## 2015-10-19 DIAGNOSIS — D649 Anemia, unspecified: Secondary | ICD-10-CM | POA: Diagnosis not present

## 2015-10-19 DIAGNOSIS — N179 Acute kidney failure, unspecified: Secondary | ICD-10-CM | POA: Diagnosis not present

## 2015-10-19 DIAGNOSIS — J811 Chronic pulmonary edema: Secondary | ICD-10-CM | POA: Diagnosis not present

## 2015-10-19 DIAGNOSIS — A419 Sepsis, unspecified organism: Secondary | ICD-10-CM | POA: Diagnosis not present

## 2015-10-19 DIAGNOSIS — I959 Hypotension, unspecified: Secondary | ICD-10-CM | POA: Diagnosis not present

## 2015-10-19 DIAGNOSIS — Z8711 Personal history of peptic ulcer disease: Secondary | ICD-10-CM | POA: Diagnosis not present

## 2015-10-19 DIAGNOSIS — J189 Pneumonia, unspecified organism: Secondary | ICD-10-CM | POA: Diagnosis not present

## 2015-10-19 DIAGNOSIS — M4316 Spondylolisthesis, lumbar region: Secondary | ICD-10-CM | POA: Diagnosis not present

## 2015-10-20 DIAGNOSIS — R509 Fever, unspecified: Secondary | ICD-10-CM | POA: Diagnosis not present

## 2015-10-20 DIAGNOSIS — J189 Pneumonia, unspecified organism: Secondary | ICD-10-CM | POA: Diagnosis not present

## 2015-10-20 DIAGNOSIS — L0390 Cellulitis, unspecified: Secondary | ICD-10-CM | POA: Diagnosis not present

## 2015-10-20 DIAGNOSIS — A419 Sepsis, unspecified organism: Secondary | ICD-10-CM | POA: Diagnosis not present

## 2015-10-20 DIAGNOSIS — D649 Anemia, unspecified: Secondary | ICD-10-CM | POA: Diagnosis not present

## 2015-10-21 DIAGNOSIS — L0390 Cellulitis, unspecified: Secondary | ICD-10-CM | POA: Diagnosis not present

## 2015-10-21 DIAGNOSIS — A419 Sepsis, unspecified organism: Secondary | ICD-10-CM | POA: Diagnosis not present

## 2015-10-21 DIAGNOSIS — J189 Pneumonia, unspecified organism: Secondary | ICD-10-CM | POA: Diagnosis not present

## 2015-10-21 DIAGNOSIS — R509 Fever, unspecified: Secondary | ICD-10-CM | POA: Diagnosis not present

## 2015-10-21 DIAGNOSIS — D649 Anemia, unspecified: Secondary | ICD-10-CM | POA: Diagnosis not present

## 2015-10-21 DIAGNOSIS — M7989 Other specified soft tissue disorders: Secondary | ICD-10-CM | POA: Diagnosis not present

## 2015-10-22 ENCOUNTER — Telehealth: Payer: Self-pay | Admitting: *Deleted

## 2015-10-22 DIAGNOSIS — L0390 Cellulitis, unspecified: Secondary | ICD-10-CM | POA: Diagnosis not present

## 2015-10-22 DIAGNOSIS — J189 Pneumonia, unspecified organism: Secondary | ICD-10-CM | POA: Diagnosis not present

## 2015-10-22 DIAGNOSIS — A419 Sepsis, unspecified organism: Secondary | ICD-10-CM | POA: Diagnosis not present

## 2015-10-22 DIAGNOSIS — R509 Fever, unspecified: Secondary | ICD-10-CM | POA: Diagnosis not present

## 2015-10-22 DIAGNOSIS — D649 Anemia, unspecified: Secondary | ICD-10-CM | POA: Diagnosis not present

## 2015-10-22 NOTE — Telephone Encounter (Signed)
Dr. Glenna Fellows is requesting Dr. Johnnye Sima return his call at his earliest convenience.  [phone]

## 2015-10-23 DIAGNOSIS — J189 Pneumonia, unspecified organism: Secondary | ICD-10-CM | POA: Diagnosis not present

## 2015-10-23 DIAGNOSIS — R509 Fever, unspecified: Secondary | ICD-10-CM | POA: Diagnosis not present

## 2015-10-23 DIAGNOSIS — L0390 Cellulitis, unspecified: Secondary | ICD-10-CM | POA: Diagnosis not present

## 2015-10-23 DIAGNOSIS — A419 Sepsis, unspecified organism: Secondary | ICD-10-CM | POA: Diagnosis not present

## 2015-10-23 DIAGNOSIS — D649 Anemia, unspecified: Secondary | ICD-10-CM | POA: Diagnosis not present

## 2015-10-24 DIAGNOSIS — A4150 Gram-negative sepsis, unspecified: Secondary | ICD-10-CM | POA: Diagnosis not present

## 2015-10-25 ENCOUNTER — Telehealth: Payer: Self-pay | Admitting: *Deleted

## 2015-10-25 DIAGNOSIS — K5660 Unspecified intestinal obstruction: Secondary | ICD-10-CM | POA: Diagnosis not present

## 2015-10-25 NOTE — Telephone Encounter (Signed)
Patient currently hospitalized at [facility] in [place] for return of discitis.  Patient was referred to Dr. Carloyn Manner in [place] for 2nd opinion for surgical intervention. In preparation for aspirate, patient was off antibiotics starting in February.  Per patient's son [name], the aspirate was positive and patient was symptomatic.  He has been restarted on vancomycin during hospitalization per son's report.   Patient's son calling to make sure Dr. Johnnye Sima is aware, as patient is scheduled for follow up at [facility] on 5/22, but he may not yet be out of the hospital.  Patient's son will request cultures to be sent to Dr. Algis Downs attention nonetheless.

## 2015-10-26 DIAGNOSIS — G2581 Restless legs syndrome: Secondary | ICD-10-CM | POA: Diagnosis not present

## 2015-10-26 DIAGNOSIS — K5900 Constipation, unspecified: Secondary | ICD-10-CM | POA: Diagnosis not present

## 2015-10-26 DIAGNOSIS — I129 Hypertensive chronic kidney disease with stage 1 through stage 4 chronic kidney disease, or unspecified chronic kidney disease: Secondary | ICD-10-CM | POA: Diagnosis not present

## 2015-10-26 DIAGNOSIS — N189 Chronic kidney disease, unspecified: Secondary | ICD-10-CM | POA: Diagnosis not present

## 2015-10-26 DIAGNOSIS — I10 Essential (primary) hypertension: Secondary | ICD-10-CM | POA: Diagnosis not present

## 2015-10-26 DIAGNOSIS — J45909 Unspecified asthma, uncomplicated: Secondary | ICD-10-CM | POA: Diagnosis not present

## 2015-10-26 DIAGNOSIS — Z7401 Bed confinement status: Secondary | ICD-10-CM | POA: Diagnosis not present

## 2015-10-26 DIAGNOSIS — M4646 Discitis, unspecified, lumbar region: Secondary | ICD-10-CM | POA: Diagnosis not present

## 2015-10-26 DIAGNOSIS — J189 Pneumonia, unspecified organism: Secondary | ICD-10-CM | POA: Diagnosis not present

## 2015-10-26 DIAGNOSIS — K219 Gastro-esophageal reflux disease without esophagitis: Secondary | ICD-10-CM | POA: Diagnosis not present

## 2015-10-26 DIAGNOSIS — J45998 Other asthma: Secondary | ICD-10-CM | POA: Diagnosis not present

## 2015-10-26 DIAGNOSIS — M4606 Spinal enthesopathy, lumbar region: Secondary | ICD-10-CM | POA: Diagnosis not present

## 2015-10-26 DIAGNOSIS — D649 Anemia, unspecified: Secondary | ICD-10-CM | POA: Diagnosis not present

## 2015-10-26 DIAGNOSIS — R278 Other lack of coordination: Secondary | ICD-10-CM | POA: Diagnosis not present

## 2015-10-26 DIAGNOSIS — R279 Unspecified lack of coordination: Secondary | ICD-10-CM | POA: Diagnosis not present

## 2015-10-26 DIAGNOSIS — A419 Sepsis, unspecified organism: Secondary | ICD-10-CM | POA: Diagnosis not present

## 2015-10-26 DIAGNOSIS — R509 Fever, unspecified: Secondary | ICD-10-CM | POA: Diagnosis not present

## 2015-10-26 DIAGNOSIS — M792 Neuralgia and neuritis, unspecified: Secondary | ICD-10-CM | POA: Diagnosis not present

## 2015-10-26 DIAGNOSIS — M6281 Muscle weakness (generalized): Secondary | ICD-10-CM | POA: Diagnosis not present

## 2015-10-26 DIAGNOSIS — D6489 Other specified anemias: Secondary | ICD-10-CM | POA: Diagnosis not present

## 2015-10-26 DIAGNOSIS — R2689 Other abnormalities of gait and mobility: Secondary | ICD-10-CM | POA: Diagnosis not present

## 2015-10-26 DIAGNOSIS — D509 Iron deficiency anemia, unspecified: Secondary | ICD-10-CM | POA: Diagnosis not present

## 2015-10-26 DIAGNOSIS — L0390 Cellulitis, unspecified: Secondary | ICD-10-CM | POA: Diagnosis not present

## 2015-10-26 DIAGNOSIS — M4626 Osteomyelitis of vertebra, lumbar region: Secondary | ICD-10-CM | POA: Diagnosis not present

## 2015-10-26 DIAGNOSIS — H409 Unspecified glaucoma: Secondary | ICD-10-CM | POA: Diagnosis not present

## 2015-10-26 NOTE — Telephone Encounter (Signed)
Thank you so much for letting me know  Derrick Sanchez

## 2015-11-05 ENCOUNTER — Ambulatory Visit: Payer: PPO | Admitting: Infectious Diseases

## 2015-11-05 ENCOUNTER — Telehealth: Payer: Self-pay | Admitting: *Deleted

## 2015-11-05 DIAGNOSIS — M25529 Pain in unspecified elbow: Secondary | ICD-10-CM | POA: Diagnosis not present

## 2015-11-05 NOTE — Telephone Encounter (Signed)
Please call pt and let him know that the previous rx will be refilled- 5mg  once weekly.

## 2015-11-05 NOTE — Telephone Encounter (Signed)
Son reports that his dad is in a SNF now, after recent hospitalization.  Today's appt was rescheduled.  Pt's son asked whether Dr. Johnnye Sima has spoken with Dr. Carloyn Manner, neurosurgeon.  Will route message to Dr. Johnnye Sima.  Dr. Rex Kras number is [phone].

## 2015-11-07 NOTE — Telephone Encounter (Signed)
Medication order is error Thank you  i have called dr roy's office

## 2015-11-15 DIAGNOSIS — J45909 Unspecified asthma, uncomplicated: Secondary | ICD-10-CM | POA: Diagnosis not present

## 2015-11-15 DIAGNOSIS — G2581 Restless legs syndrome: Secondary | ICD-10-CM | POA: Diagnosis not present

## 2015-11-15 DIAGNOSIS — I129 Hypertensive chronic kidney disease with stage 1 through stage 4 chronic kidney disease, or unspecified chronic kidney disease: Secondary | ICD-10-CM | POA: Diagnosis not present

## 2015-11-15 DIAGNOSIS — M4646 Discitis, unspecified, lumbar region: Secondary | ICD-10-CM | POA: Diagnosis not present

## 2015-11-15 DIAGNOSIS — M792 Neuralgia and neuritis, unspecified: Secondary | ICD-10-CM | POA: Diagnosis not present

## 2015-11-15 DIAGNOSIS — K5900 Constipation, unspecified: Secondary | ICD-10-CM | POA: Diagnosis not present

## 2015-11-15 DIAGNOSIS — M4626 Osteomyelitis of vertebra, lumbar region: Secondary | ICD-10-CM | POA: Diagnosis not present

## 2015-11-15 DIAGNOSIS — D509 Iron deficiency anemia, unspecified: Secondary | ICD-10-CM | POA: Diagnosis not present

## 2015-11-15 DIAGNOSIS — R2689 Other abnormalities of gait and mobility: Secondary | ICD-10-CM | POA: Diagnosis not present

## 2015-11-15 DIAGNOSIS — N189 Chronic kidney disease, unspecified: Secondary | ICD-10-CM | POA: Diagnosis not present

## 2015-11-15 DIAGNOSIS — H409 Unspecified glaucoma: Secondary | ICD-10-CM | POA: Diagnosis not present

## 2015-11-15 DIAGNOSIS — R278 Other lack of coordination: Secondary | ICD-10-CM | POA: Diagnosis not present

## 2015-11-15 DIAGNOSIS — M6281 Muscle weakness (generalized): Secondary | ICD-10-CM | POA: Diagnosis not present

## 2015-11-15 DIAGNOSIS — K219 Gastro-esophageal reflux disease without esophagitis: Secondary | ICD-10-CM | POA: Diagnosis not present

## 2015-11-21 DIAGNOSIS — M4646 Discitis, unspecified, lumbar region: Secondary | ICD-10-CM | POA: Diagnosis not present

## 2015-11-22 DIAGNOSIS — M4646 Discitis, unspecified, lumbar region: Secondary | ICD-10-CM | POA: Diagnosis not present

## 2015-11-22 DIAGNOSIS — M8668 Other chronic osteomyelitis, other site: Secondary | ICD-10-CM | POA: Diagnosis not present

## 2015-11-26 ENCOUNTER — Ambulatory Visit (INDEPENDENT_AMBULATORY_CARE_PROVIDER_SITE_OTHER): Payer: PPO | Admitting: Infectious Diseases

## 2015-11-26 ENCOUNTER — Encounter: Payer: Self-pay | Admitting: Infectious Diseases

## 2015-11-26 VITALS — BP 138/66 | HR 70 | Temp 98.2°F

## 2015-11-26 DIAGNOSIS — G061 Intraspinal abscess and granuloma: Secondary | ICD-10-CM

## 2015-11-26 DIAGNOSIS — R3911 Hesitancy of micturition: Secondary | ICD-10-CM | POA: Diagnosis not present

## 2015-11-26 NOTE — Assessment & Plan Note (Addendum)
Will continue him on Cipro for 2 more months.  He has been getting ESR at [facility]. Noted in HPI.  I do not believe his current pasteurella infection is necessarily the cause of his spinal infection. This bacteria is commonly associated with animal bites/scratches. I explained this to him.   He has been getting ESR va Dr Roy's office, will ask for those future labs.  He will f/u with Dr Carloyn Manner as well.  RTC 2 months.

## 2015-11-26 NOTE — Assessment & Plan Note (Signed)
[facility] him get foley out and do post-void residual.

## 2015-11-26 NOTE — Progress Notes (Signed)
Subjective:    Patient ID: Derrick Sanchez, male    DOB: 26-Jan-1931, 82 y.o.   MRN: [id number]  HPI 82 yo M with hx of L4-5 decompression with prosthesis on 02-06-14. He did well post-op and was d/c on 02-08-14.  His course was further complicated by a fall and L greater trochanter fracture 02-27-14. He was d/c home on 9-17 with medical mgmt.  He returned on 03-12-14 with worsening back and MRI that was highly suggestive of post-operative infection, osteomyelitis, and epidural abscess. He was afebrile and had WBC that was normal. He had ESR 56. CRP 5. 2.  He underwent debridement/re-exploration 9-27. He was started on vancomycin and ceftriaxone post-operatively, ceftriaxone then changed to cefepime. His operative Cx were (-). He was d/c home 9-30 with vanco/ceftriaxone/rifampin. He completed only 4 weeks of this as he had issues with increased Cr.  He had f/u CT scan of spine on 05-17-14:  1. Sequelae of discitis osteomyelitis at L4-L5 with endplate erosions and loosening of pedicle screws at both levels, the right L4 screw has breeched the superior right L4 endplate. Nondisplaced right pedicle fracture suspected and may be subacute. No arthrodesis. 2. Nonetheless, underlying L4-L5 anterolisthesis has not significantly changed. The thecal sac is not well delineated at this level. 3. Multifactorial spinal stenosis at L3-L4 appears stable since September. He has had more back pain and has since f/u with [name].  He had MRI 10-11-14 at [place] office: L4-5 chronic/progressive osteo/disctis. Now with abscess surrounding intervertebral cage new since 2015.  He was sent to IR and had aspirate which grew MRSE (s- lvq). He was started on vancomycin via PIC (started 5-4).   He had removal of his hardware on 11-23-14. His Cx at that time were (-).  He had f/u MRI 11-29-14 at [name] office.   He had ID f/u on 6-16 and was planned to continue on vanco to the end of July (6 weeks from  hardware out).  Had neurosurgery f/u 7-20 and had x-rays, which he felt were unchanged.  His ESR on 7-21 was 50.  Had ID f/u on 7-22 and had ceftriaxone added to his vancomycin.  He had f/u MRI on 7-29 that showed paraspinal soft tissue infectious findings slowly improving. With resolution of some of dorsal abscess. Persistent myositis with soft tissue phlegmon. Persistent discitis/osteomyelitis L3-4, L4-5.   He had ID f/u on 9-21, he underwent repeat MRI on 9-29 showing: Some decreased paraspinal and erector spinae muscle inflammation since July, but continued confluent marrow inflammation from the L3 to the superior S1 level following the 3 level discitis osteomyelitis and subtotal destruction of the L4 vertebral body. Fluid signal persists in the L3-L4 and L4-L5 disc spaces. No epidural or paraspinal abscess. Mass effect and distortion upon the thecal sac at L4-L5 is stable to mildly increased. He completed vanco 03-2015 and was then transitioned to bactrim.   He was obtaining a second opinion from Neurosurgery he was off his atbx for around 2 months. He was being eval for possible repeat surgery. He had core bx (negative per notes) and was felt to have complete obliteration of L4 and L5, suspicion for L3-4 infection as well.  He had disc aspiration 10-11-2015 that was negative.   He returned to [facility] on 10-19-15 after developing extreme weakness. He was found to hvae BCx (2/2) P multocida (pan-sens). He also had UCx > 100k K pneumo (R-amp).  He was d/c to [facility] nsg center on 10-26-15 on  Cipro.  He d/c home AMA on 11-16-15.  He has no home nurse, just his wife.  Home labs (Cr 1.07) 11-21-15.  He had f/u wth Dr Carloyn Manner on 6-8. He has gotten home hospital bed and rehab.   He has been doing ok with his home anbx. Has 12 month old cat at home. He has gotten scractches from this.     11-21-15  08-09-15 ESR  58  36  Review of Systems  Constitutional: Negative for fever, chills, appetite  change and unexpected weight change.  Cardiovascular: Positive for leg swelling.  Gastrointestinal: Negative for diarrhea and constipation.  Genitourinary: Negative for difficulty urinating.  can stand but not walk.  Has foley since hospitalization.      Objective:   Physical Exam  Constitutional: He appears well-developed. He appears cachectic.  HENT:  Mouth/Throat: No oropharyngeal exudate.  Neck: Neck supple.  Cardiovascular: Normal rate, regular rhythm and normal heart sounds.   Pulmonary/Chest: Effort normal and breath sounds normal.  Abdominal: Soft. Bowel sounds are normal. There is no tenderness. There is no rebound.  Musculoskeletal: He exhibits edema.       Back:  Lymphadenopathy:    He has no cervical adenopathy.  Neurological:  Weakness in BLE.  Good grip strength.       Assessment & Plan:

## 2015-11-27 LAB — FUNGUS CULTURE W SMEAR

## 2015-11-27 LAB — AFB CULTURE WITH SMEAR (NOT AT ARMC)

## 2015-11-28 DIAGNOSIS — D510 Vitamin B12 deficiency anemia due to intrinsic factor deficiency: Secondary | ICD-10-CM | POA: Diagnosis not present

## 2015-11-28 DIAGNOSIS — G2581 Restless legs syndrome: Secondary | ICD-10-CM | POA: Diagnosis not present

## 2015-11-28 DIAGNOSIS — R3911 Hesitancy of micturition: Secondary | ICD-10-CM | POA: Diagnosis not present

## 2015-11-28 DIAGNOSIS — M5416 Radiculopathy, lumbar region: Secondary | ICD-10-CM | POA: Diagnosis not present

## 2015-11-28 DIAGNOSIS — Z9181 History of falling: Secondary | ICD-10-CM | POA: Diagnosis not present

## 2015-11-28 DIAGNOSIS — J45909 Unspecified asthma, uncomplicated: Secondary | ICD-10-CM | POA: Diagnosis not present

## 2015-11-28 DIAGNOSIS — M4806 Spinal stenosis, lumbar region: Secondary | ICD-10-CM | POA: Diagnosis not present

## 2015-11-28 DIAGNOSIS — G4733 Obstructive sleep apnea (adult) (pediatric): Secondary | ICD-10-CM | POA: Diagnosis not present

## 2015-11-28 DIAGNOSIS — Z466 Encounter for fitting and adjustment of urinary device: Secondary | ICD-10-CM | POA: Diagnosis not present

## 2015-11-29 ENCOUNTER — Telehealth: Payer: Self-pay | Admitting: *Deleted

## 2015-11-29 ENCOUNTER — Telehealth: Payer: Self-pay

## 2015-11-29 DIAGNOSIS — M4806 Spinal stenosis, lumbar region: Secondary | ICD-10-CM | POA: Diagnosis not present

## 2015-11-29 DIAGNOSIS — Z9181 History of falling: Secondary | ICD-10-CM | POA: Diagnosis not present

## 2015-11-29 DIAGNOSIS — G4733 Obstructive sleep apnea (adult) (pediatric): Secondary | ICD-10-CM | POA: Diagnosis not present

## 2015-11-29 DIAGNOSIS — D510 Vitamin B12 deficiency anemia due to intrinsic factor deficiency: Secondary | ICD-10-CM | POA: Diagnosis not present

## 2015-11-29 DIAGNOSIS — Z466 Encounter for fitting and adjustment of urinary device: Secondary | ICD-10-CM | POA: Diagnosis not present

## 2015-11-29 DIAGNOSIS — J45909 Unspecified asthma, uncomplicated: Secondary | ICD-10-CM | POA: Diagnosis not present

## 2015-11-29 DIAGNOSIS — G2581 Restless legs syndrome: Secondary | ICD-10-CM | POA: Diagnosis not present

## 2015-11-29 DIAGNOSIS — R3911 Hesitancy of micturition: Secondary | ICD-10-CM | POA: Diagnosis not present

## 2015-11-29 DIAGNOSIS — M5416 Radiculopathy, lumbar region: Secondary | ICD-10-CM | POA: Diagnosis not present

## 2015-11-29 NOTE — Telephone Encounter (Signed)
Cherry[facility], [facility] RN calling with update after foley cath removal. Per [name], he voided 300 cc within a few hours after removal, and has passed 1300 CC since that measurement.  Patient instructed to call [facility] if he feels any pressure or has difficulty voiding. As of now, he denies those symptoms. She mentioned that he would like physical therapy.  Please advise if this is appropriate. Cherryl will fax order for cosign. Cherryl - [phone] [name], RN

## 2015-11-29 NOTE — Telephone Encounter (Signed)
Called pharmacist Monica Martinez at [facility] to give Dr. Algis Downs verbal order to continue Cipro antibiotic for another 2 months. Patient has a return appointment August 14th at [facility]. Rodman Key, LPN

## 2015-11-30 NOTE — Telephone Encounter (Signed)
Please ask Dr Carloyn Manner to clear him for PT thanks

## 2015-12-04 NOTE — Telephone Encounter (Signed)
Spoke with Dr. Rex Kras office (neurosurgery).  Louretta Shorten has been trying to order PT for the patient.  She has encountered difficulty, as the patient needs to discharge his current home health agency (not [facility]) before the PT agency will engage him in services.  Louretta Shorten has unsuccessfully attempted to explain this to the patient, RN offered to bring his son [name] into the conversation so he can assist.  [name] will contact [facility] for direction.

## 2015-12-11 DIAGNOSIS — J45909 Unspecified asthma, uncomplicated: Secondary | ICD-10-CM | POA: Diagnosis not present

## 2015-12-11 DIAGNOSIS — M5416 Radiculopathy, lumbar region: Secondary | ICD-10-CM | POA: Diagnosis not present

## 2015-12-11 DIAGNOSIS — G4733 Obstructive sleep apnea (adult) (pediatric): Secondary | ICD-10-CM | POA: Diagnosis not present

## 2015-12-11 DIAGNOSIS — Z466 Encounter for fitting and adjustment of urinary device: Secondary | ICD-10-CM | POA: Diagnosis not present

## 2015-12-11 DIAGNOSIS — Z9181 History of falling: Secondary | ICD-10-CM | POA: Diagnosis not present

## 2015-12-11 DIAGNOSIS — G2581 Restless legs syndrome: Secondary | ICD-10-CM | POA: Diagnosis not present

## 2015-12-11 DIAGNOSIS — R3911 Hesitancy of micturition: Secondary | ICD-10-CM | POA: Diagnosis not present

## 2015-12-11 DIAGNOSIS — D510 Vitamin B12 deficiency anemia due to intrinsic factor deficiency: Secondary | ICD-10-CM | POA: Diagnosis not present

## 2015-12-11 DIAGNOSIS — M4806 Spinal stenosis, lumbar region: Secondary | ICD-10-CM | POA: Diagnosis not present

## 2015-12-11 NOTE — Telephone Encounter (Signed)
Patient still receiving [facility] home health nurse visits for foley follow-up (plan of care for weekly checks for 4 weeks, catheter was pulled by [facility] on 6/14, patient without issue since).  This active nursing order is still preventing physical therapy orders from Dr. Carloyn Manner (neurosurgeon), as he prefers a different agency for PT. Patient still asking [facility] for PT, asked his [place] to help.  She contacted Dr. Willey Blade, primary care, obtained verbal order from Dr. Willey Blade for [place] PT.  [facility] PT evaluation done this morning.  Per son, the patient and the family would actually prefer that PT be directed by Dr. Carloyn Manner instead of Dr. Willey Blade.  This was Dr. Algis Downs intention as well.  Dr. Johnnye Sima unavailable.  RN obtained verbal order from Dr. Linus Salmons to discontinue [place] foley follow up, relayed verbal order to [facility] at [facility].  RN contacted Dr. Ria Comment office to see if they would discontinue their PT orders and allow Dr. Carloyn Manner to direct these. They will be doing so.  RN contacted Dr. Rex Kras office, relayed today's events to Louretta Shorten who will follow up with original PT plan per Dr. Carloyn Manner.  She will contact [facility] if there are any issues. Patient's son [name] notified.  [name], RN

## 2015-12-12 DIAGNOSIS — R3911 Hesitancy of micturition: Secondary | ICD-10-CM | POA: Diagnosis not present

## 2015-12-12 DIAGNOSIS — J45909 Unspecified asthma, uncomplicated: Secondary | ICD-10-CM | POA: Diagnosis not present

## 2015-12-12 DIAGNOSIS — M4806 Spinal stenosis, lumbar region: Secondary | ICD-10-CM | POA: Diagnosis not present

## 2015-12-12 DIAGNOSIS — Z466 Encounter for fitting and adjustment of urinary device: Secondary | ICD-10-CM | POA: Diagnosis not present

## 2015-12-12 DIAGNOSIS — Z9181 History of falling: Secondary | ICD-10-CM | POA: Diagnosis not present

## 2015-12-12 DIAGNOSIS — G2581 Restless legs syndrome: Secondary | ICD-10-CM | POA: Diagnosis not present

## 2015-12-12 DIAGNOSIS — D510 Vitamin B12 deficiency anemia due to intrinsic factor deficiency: Secondary | ICD-10-CM | POA: Diagnosis not present

## 2015-12-12 DIAGNOSIS — M5416 Radiculopathy, lumbar region: Secondary | ICD-10-CM | POA: Diagnosis not present

## 2015-12-12 DIAGNOSIS — G4733 Obstructive sleep apnea (adult) (pediatric): Secondary | ICD-10-CM | POA: Diagnosis not present

## 2015-12-16 DIAGNOSIS — R2681 Unsteadiness on feet: Secondary | ICD-10-CM | POA: Diagnosis not present

## 2015-12-16 DIAGNOSIS — Z9181 History of falling: Secondary | ICD-10-CM | POA: Diagnosis not present

## 2015-12-16 DIAGNOSIS — Z96652 Presence of left artificial knee joint: Secondary | ICD-10-CM | POA: Diagnosis not present

## 2015-12-16 DIAGNOSIS — M8668 Other chronic osteomyelitis, other site: Secondary | ICD-10-CM | POA: Diagnosis not present

## 2015-12-16 DIAGNOSIS — M4646 Discitis, unspecified, lumbar region: Secondary | ICD-10-CM | POA: Diagnosis not present

## 2015-12-16 DIAGNOSIS — M6281 Muscle weakness (generalized): Secondary | ICD-10-CM | POA: Diagnosis not present

## 2015-12-16 DIAGNOSIS — Z96641 Presence of right artificial hip joint: Secondary | ICD-10-CM | POA: Diagnosis not present

## 2015-12-20 DIAGNOSIS — Z9181 History of falling: Secondary | ICD-10-CM | POA: Diagnosis not present

## 2015-12-20 DIAGNOSIS — M8668 Other chronic osteomyelitis, other site: Secondary | ICD-10-CM | POA: Diagnosis not present

## 2015-12-20 DIAGNOSIS — M4646 Discitis, unspecified, lumbar region: Secondary | ICD-10-CM | POA: Diagnosis not present

## 2015-12-20 DIAGNOSIS — R2681 Unsteadiness on feet: Secondary | ICD-10-CM | POA: Diagnosis not present

## 2015-12-20 DIAGNOSIS — Z96641 Presence of right artificial hip joint: Secondary | ICD-10-CM | POA: Diagnosis not present

## 2015-12-20 DIAGNOSIS — M6281 Muscle weakness (generalized): Secondary | ICD-10-CM | POA: Diagnosis not present

## 2015-12-20 DIAGNOSIS — Z96652 Presence of left artificial knee joint: Secondary | ICD-10-CM | POA: Diagnosis not present

## 2015-12-22 DIAGNOSIS — M8668 Other chronic osteomyelitis, other site: Secondary | ICD-10-CM | POA: Diagnosis not present

## 2015-12-22 DIAGNOSIS — M4646 Discitis, unspecified, lumbar region: Secondary | ICD-10-CM | POA: Diagnosis not present

## 2015-12-25 DIAGNOSIS — M1990 Unspecified osteoarthritis, unspecified site: Secondary | ICD-10-CM | POA: Diagnosis not present

## 2015-12-25 DIAGNOSIS — Z79899 Other long term (current) drug therapy: Secondary | ICD-10-CM | POA: Diagnosis not present

## 2015-12-25 DIAGNOSIS — I872 Venous insufficiency (chronic) (peripheral): Secondary | ICD-10-CM | POA: Diagnosis not present

## 2015-12-25 DIAGNOSIS — H409 Unspecified glaucoma: Secondary | ICD-10-CM | POA: Diagnosis not present

## 2015-12-25 DIAGNOSIS — E538 Deficiency of other specified B group vitamins: Secondary | ICD-10-CM | POA: Diagnosis not present

## 2015-12-25 DIAGNOSIS — Z125 Encounter for screening for malignant neoplasm of prostate: Secondary | ICD-10-CM | POA: Diagnosis not present

## 2015-12-25 DIAGNOSIS — K219 Gastro-esophageal reflux disease without esophagitis: Secondary | ICD-10-CM | POA: Diagnosis not present

## 2015-12-25 DIAGNOSIS — E213 Hyperparathyroidism, unspecified: Secondary | ICD-10-CM | POA: Diagnosis not present

## 2015-12-25 DIAGNOSIS — G2581 Restless legs syndrome: Secondary | ICD-10-CM | POA: Diagnosis not present

## 2016-01-02 DIAGNOSIS — M8668 Other chronic osteomyelitis, other site: Secondary | ICD-10-CM | POA: Diagnosis not present

## 2016-01-02 DIAGNOSIS — M4646 Discitis, unspecified, lumbar region: Secondary | ICD-10-CM | POA: Diagnosis not present

## 2016-01-03 DIAGNOSIS — D539 Nutritional anemia, unspecified: Secondary | ICD-10-CM | POA: Diagnosis not present

## 2016-01-03 DIAGNOSIS — G061 Intraspinal abscess and granuloma: Secondary | ICD-10-CM | POA: Diagnosis not present

## 2016-01-03 DIAGNOSIS — Z0001 Encounter for general adult medical examination with abnormal findings: Secondary | ICD-10-CM | POA: Diagnosis not present

## 2016-01-03 DIAGNOSIS — N183 Chronic kidney disease, stage 3 (moderate): Secondary | ICD-10-CM | POA: Diagnosis not present

## 2016-01-14 DIAGNOSIS — M8668 Other chronic osteomyelitis, other site: Secondary | ICD-10-CM | POA: Diagnosis not present

## 2016-01-14 DIAGNOSIS — R2681 Unsteadiness on feet: Secondary | ICD-10-CM | POA: Diagnosis not present

## 2016-01-14 DIAGNOSIS — M4646 Discitis, unspecified, lumbar region: Secondary | ICD-10-CM | POA: Diagnosis not present

## 2016-01-14 DIAGNOSIS — Z9181 History of falling: Secondary | ICD-10-CM | POA: Diagnosis not present

## 2016-01-14 DIAGNOSIS — Z96641 Presence of right artificial hip joint: Secondary | ICD-10-CM | POA: Diagnosis not present

## 2016-01-14 DIAGNOSIS — Z96652 Presence of left artificial knee joint: Secondary | ICD-10-CM | POA: Diagnosis not present

## 2016-01-14 DIAGNOSIS — M6281 Muscle weakness (generalized): Secondary | ICD-10-CM | POA: Diagnosis not present

## 2016-01-16 DIAGNOSIS — Z96652 Presence of left artificial knee joint: Secondary | ICD-10-CM | POA: Diagnosis not present

## 2016-01-16 DIAGNOSIS — Z96641 Presence of right artificial hip joint: Secondary | ICD-10-CM | POA: Diagnosis not present

## 2016-01-16 DIAGNOSIS — M4646 Discitis, unspecified, lumbar region: Secondary | ICD-10-CM | POA: Diagnosis not present

## 2016-01-16 DIAGNOSIS — R2681 Unsteadiness on feet: Secondary | ICD-10-CM | POA: Diagnosis not present

## 2016-01-16 DIAGNOSIS — M6281 Muscle weakness (generalized): Secondary | ICD-10-CM | POA: Diagnosis not present

## 2016-01-16 DIAGNOSIS — M8668 Other chronic osteomyelitis, other site: Secondary | ICD-10-CM | POA: Diagnosis not present

## 2016-01-16 DIAGNOSIS — Z9181 History of falling: Secondary | ICD-10-CM | POA: Diagnosis not present

## 2016-01-17 DIAGNOSIS — M6281 Muscle weakness (generalized): Secondary | ICD-10-CM | POA: Diagnosis not present

## 2016-01-17 DIAGNOSIS — R2681 Unsteadiness on feet: Secondary | ICD-10-CM | POA: Diagnosis not present

## 2016-01-17 DIAGNOSIS — M8668 Other chronic osteomyelitis, other site: Secondary | ICD-10-CM | POA: Diagnosis not present

## 2016-01-17 DIAGNOSIS — Z96641 Presence of right artificial hip joint: Secondary | ICD-10-CM | POA: Diagnosis not present

## 2016-01-17 DIAGNOSIS — Z9181 History of falling: Secondary | ICD-10-CM | POA: Diagnosis not present

## 2016-01-17 DIAGNOSIS — Z96652 Presence of left artificial knee joint: Secondary | ICD-10-CM | POA: Diagnosis not present

## 2016-01-17 DIAGNOSIS — M4646 Discitis, unspecified, lumbar region: Secondary | ICD-10-CM | POA: Diagnosis not present

## 2016-01-22 DIAGNOSIS — M4646 Discitis, unspecified, lumbar region: Secondary | ICD-10-CM | POA: Diagnosis not present

## 2016-01-22 DIAGNOSIS — M8668 Other chronic osteomyelitis, other site: Secondary | ICD-10-CM | POA: Diagnosis not present

## 2016-01-22 DIAGNOSIS — M47816 Spondylosis without myelopathy or radiculopathy, lumbar region: Secondary | ICD-10-CM | POA: Diagnosis not present

## 2016-01-28 ENCOUNTER — Ambulatory Visit: Payer: PPO | Admitting: Infectious Diseases

## 2016-01-29 DIAGNOSIS — Z96641 Presence of right artificial hip joint: Secondary | ICD-10-CM | POA: Diagnosis not present

## 2016-01-29 DIAGNOSIS — M6281 Muscle weakness (generalized): Secondary | ICD-10-CM | POA: Diagnosis not present

## 2016-01-29 DIAGNOSIS — Z96652 Presence of left artificial knee joint: Secondary | ICD-10-CM | POA: Diagnosis not present

## 2016-01-29 DIAGNOSIS — M4646 Discitis, unspecified, lumbar region: Secondary | ICD-10-CM | POA: Diagnosis not present

## 2016-01-29 DIAGNOSIS — Z9181 History of falling: Secondary | ICD-10-CM | POA: Diagnosis not present

## 2016-01-29 DIAGNOSIS — R2681 Unsteadiness on feet: Secondary | ICD-10-CM | POA: Diagnosis not present

## 2016-01-29 DIAGNOSIS — M8668 Other chronic osteomyelitis, other site: Secondary | ICD-10-CM | POA: Diagnosis not present

## 2016-01-31 DIAGNOSIS — M8668 Other chronic osteomyelitis, other site: Secondary | ICD-10-CM | POA: Diagnosis not present

## 2016-01-31 DIAGNOSIS — M47816 Spondylosis without myelopathy or radiculopathy, lumbar region: Secondary | ICD-10-CM | POA: Diagnosis not present

## 2016-01-31 DIAGNOSIS — M4646 Discitis, unspecified, lumbar region: Secondary | ICD-10-CM | POA: Diagnosis not present

## 2016-02-01 ENCOUNTER — Ambulatory Visit (INDEPENDENT_AMBULATORY_CARE_PROVIDER_SITE_OTHER): Payer: PPO | Admitting: Infectious Diseases

## 2016-02-01 ENCOUNTER — Encounter: Payer: Self-pay | Admitting: Infectious Diseases

## 2016-02-01 DIAGNOSIS — G061 Intraspinal abscess and granuloma: Secondary | ICD-10-CM | POA: Diagnosis not present

## 2016-02-01 LAB — C-REACTIVE PROTEIN

## 2016-02-01 LAB — SEDIMENTATION RATE: Sed Rate: 18 mm/hr (ref 0–20)

## 2016-02-01 MED ORDER — LEVOFLOXACIN 500 MG PO TABS: 500.0000 mg | ORAL_TABLET | Freq: Every day | ORAL | 3 refills | Status: DC

## 2016-02-01 NOTE — Assessment & Plan Note (Signed)
He does not appear to be planned for surgery.  I encouraged him to continue PT Will change his cipro to levaquin Will check his ESR and CRP today. Will see him back in 6 months.

## 2016-02-01 NOTE — Progress Notes (Signed)
Subjective:    Patient ID: Derrick Sanchez, male    DOB: Jan 26, 1931, 82 y.o.   MRN: [id number]  HPI 82 yo M with hx of L4-5 decompression with prosthesis on 02-06-14. He did well post-op and was d/c on 02-08-14.  His course was further complicated by a fall and L greater trochanter fracture 02-27-14. He was d/c home on 9-17 with medical mgmt.  He returned on 03-12-14 with worsening back and MRI that was highly suggestive of post-operative infection, osteomyelitis, and epidural abscess. He was afebrile and had WBC that was normal. He had ESR 56. CRP 5. 2.  He underwent debridement/re-exploration 9-27. He was started on vancomycin and ceftriaxone post-operatively, ceftriaxone then changed to cefepime. His operative Cx were (-). He was d/c home 9-30 with vanco/ceftriaxone/rifampin. He completed only 4 weeks of this as he had issues with increased Cr.  He had f/u CT scan of spine on 05-17-14:  1. Sequelae of discitis osteomyelitis at L4-L5 with endplate erosions and loosening of pedicle screws at both levels, the right L4 screw has breeched the superior right L4 endplate. Nondisplaced right pedicle fracture suspected and may be subacute. No arthrodesis. 2. Nonetheless, underlying L4-L5 anterolisthesis has not significantly changed. The thecal sac is not well delineated at this level. 3. Multifactorial spinal stenosis at L3-L4 appears stable since September. He has had more back pain and has since f/u with [name].  He had MRI 10-11-14 at [facility] office: L4-5 chronic/progressive osteo/disctis. Now with abscess surrounding intervertebral cage new since 2015.  He was sent to IR and had aspirate which grew MRSE (s- lvq). He was started on vancomycin via PIC (started 5-4).   He had removal of his hardware on 11-23-14. His Cx at that time were (-).  He had f/u MRI 11-29-14 at [name] office.   He had ID f/u on 6-16 and was planned to continue on vanco to the end of July (6 weeks from  hardware out).  Had neurosurgery f/u 7-20 and had x-rays, which he felt were unchanged.  His ESR on 7-21 was 50.  Had ID f/u on 7-22 and had ceftriaxone added to his vancomycin.  He had f/u MRI on 7-29 that showed paraspinal soft tissue infectious findings slowly improving. With resolution of some of dorsal abscess. Persistent myositis with soft tissue phlegmon. Persistent discitis/osteomyelitis L3-4, L4-5.   He had ID f/u on 9-21, he underwent repeat MRI on 9-29 showing: Some decreased paraspinal and erector spinae muscle inflammation since July, but continued confluent marrow inflammation from the L3 to the superior S1 level following the 3 level discitis osteomyelitis and subtotal destruction of the L4 vertebral body. Fluid signal persists in the L3-L4 and L4-L5 disc spaces. No epidural or paraspinal abscess. Mass effect and distortion upon the thecal sac at L4-L5 is stable to mildly increased. He completed vanco 03-2015 and was then transitioned to bactrim.   He was obtaining a second opinion from Neurosurgery he was off his atbx for around 2 months. He had core bx (negative per notes) and was felt to have complete obliteration of L4 and L5, suspicion for L3-4 infection as well. He had disc aspiration 10-11-2015 that was negative.   He returned to [facility] on 10-19-15 after developing extreme weakness. He was found to hvae BCx (2/2) P multocida (pan-sens). He also had UCx > 100k K pneumo (R-amp). Had new kitten at home.  He was d/c to [name] nsg center on 10-26-15 on Cipro.  He d/c  home [name] on 11-16-15.  He has no home nurse, just his wife.  Home labs (Cr 1.07) 11-21-15.  He had f/u wth Dr Carloyn Manner on 6-8. He has gotten home hospital bed and rehab.                                               11-21-15                                  08-09-15 ESR                                     58                                        36  He was seen in ID in f/u on 11-26-15 with plan to  continue his anbx (cipro) for 2 more months.  Has been feeling well.  Still having a lot of back pain, worse in AM. No fever, but has had chills "a couple of times".   Has had f/u with Dr Carloyn Manner- he is in PT at home. Not scheduled for surgery, not felt that he would survive.    Review of Systems See HPI.     Objective:   Physical Exam  Constitutional: He appears well-developed and well-nourished.  Cardiovascular: Normal rate, regular rhythm and normal heart sounds.   Pulmonary/Chest: Effort normal and breath sounds normal.  Abdominal: Soft. Bowel sounds are normal. There is no tenderness. There is no rebound.  Musculoskeletal:       Arms: Lymphadenopathy:    He has no cervical adenopathy.      Assessment & Plan:

## 2016-02-06 DIAGNOSIS — M8668 Other chronic osteomyelitis, other site: Secondary | ICD-10-CM | POA: Diagnosis not present

## 2016-02-06 DIAGNOSIS — M6281 Muscle weakness (generalized): Secondary | ICD-10-CM | POA: Diagnosis not present

## 2016-02-06 DIAGNOSIS — Z9181 History of falling: Secondary | ICD-10-CM | POA: Diagnosis not present

## 2016-02-06 DIAGNOSIS — R2681 Unsteadiness on feet: Secondary | ICD-10-CM | POA: Diagnosis not present

## 2016-02-06 DIAGNOSIS — Z96652 Presence of left artificial knee joint: Secondary | ICD-10-CM | POA: Diagnosis not present

## 2016-02-06 DIAGNOSIS — M4646 Discitis, unspecified, lumbar region: Secondary | ICD-10-CM | POA: Diagnosis not present

## 2016-02-06 DIAGNOSIS — Z96641 Presence of right artificial hip joint: Secondary | ICD-10-CM | POA: Diagnosis not present

## 2016-02-07 ENCOUNTER — Telehealth: Payer: Self-pay | Admitting: *Deleted

## 2016-02-07 DIAGNOSIS — R2681 Unsteadiness on feet: Secondary | ICD-10-CM | POA: Diagnosis not present

## 2016-02-07 DIAGNOSIS — M6281 Muscle weakness (generalized): Secondary | ICD-10-CM | POA: Diagnosis not present

## 2016-02-07 DIAGNOSIS — Z9181 History of falling: Secondary | ICD-10-CM | POA: Diagnosis not present

## 2016-02-07 DIAGNOSIS — Z96641 Presence of right artificial hip joint: Secondary | ICD-10-CM | POA: Diagnosis not present

## 2016-02-07 DIAGNOSIS — M4646 Discitis, unspecified, lumbar region: Secondary | ICD-10-CM | POA: Diagnosis not present

## 2016-02-07 DIAGNOSIS — M8668 Other chronic osteomyelitis, other site: Secondary | ICD-10-CM | POA: Diagnosis not present

## 2016-02-07 DIAGNOSIS — Z96652 Presence of left artificial knee joint: Secondary | ICD-10-CM | POA: Diagnosis not present

## 2016-02-07 NOTE — Telephone Encounter (Signed)
Patient filled Levaquin 8/18 after his office visit, began taking it 8/19.  Patient's son is asking if Derrick Sanchez should first finish the 17 Cipro he already has, or if there was a specific reason to change to Levaquin. Please advise.  [name], RN

## 2016-02-08 NOTE — Telephone Encounter (Signed)
Change only based on ease of once daily dosing.  Can finish cipro first

## 2016-02-12 NOTE — Telephone Encounter (Signed)
Relayed results, advice.

## 2016-02-14 DIAGNOSIS — Z9181 History of falling: Secondary | ICD-10-CM | POA: Diagnosis not present

## 2016-02-14 DIAGNOSIS — R2681 Unsteadiness on feet: Secondary | ICD-10-CM | POA: Diagnosis not present

## 2016-02-14 DIAGNOSIS — M8668 Other chronic osteomyelitis, other site: Secondary | ICD-10-CM | POA: Diagnosis not present

## 2016-02-14 DIAGNOSIS — M5136 Other intervertebral disc degeneration, lumbar region: Secondary | ICD-10-CM | POA: Diagnosis not present

## 2016-02-14 DIAGNOSIS — Z96652 Presence of left artificial knee joint: Secondary | ICD-10-CM | POA: Diagnosis not present

## 2016-02-14 DIAGNOSIS — Z96641 Presence of right artificial hip joint: Secondary | ICD-10-CM | POA: Diagnosis not present

## 2016-02-14 DIAGNOSIS — M6281 Muscle weakness (generalized): Secondary | ICD-10-CM | POA: Diagnosis not present

## 2016-02-14 DIAGNOSIS — M4646 Discitis, unspecified, lumbar region: Secondary | ICD-10-CM | POA: Diagnosis not present

## 2016-02-15 DIAGNOSIS — M5136 Other intervertebral disc degeneration, lumbar region: Secondary | ICD-10-CM | POA: Diagnosis not present

## 2016-02-15 DIAGNOSIS — Z9181 History of falling: Secondary | ICD-10-CM | POA: Diagnosis not present

## 2016-02-15 DIAGNOSIS — Z96641 Presence of right artificial hip joint: Secondary | ICD-10-CM | POA: Diagnosis not present

## 2016-02-15 DIAGNOSIS — Z96652 Presence of left artificial knee joint: Secondary | ICD-10-CM | POA: Diagnosis not present

## 2016-02-15 DIAGNOSIS — M8668 Other chronic osteomyelitis, other site: Secondary | ICD-10-CM | POA: Diagnosis not present

## 2016-02-15 DIAGNOSIS — M4646 Discitis, unspecified, lumbar region: Secondary | ICD-10-CM | POA: Diagnosis not present

## 2016-02-15 DIAGNOSIS — M6281 Muscle weakness (generalized): Secondary | ICD-10-CM | POA: Diagnosis not present

## 2016-02-15 DIAGNOSIS — R2681 Unsteadiness on feet: Secondary | ICD-10-CM | POA: Diagnosis not present

## 2016-02-21 DIAGNOSIS — J387 Other diseases of larynx: Secondary | ICD-10-CM | POA: Diagnosis not present

## 2016-03-03 DIAGNOSIS — M8668 Other chronic osteomyelitis, other site: Secondary | ICD-10-CM | POA: Diagnosis not present

## 2016-03-03 DIAGNOSIS — M5136 Other intervertebral disc degeneration, lumbar region: Secondary | ICD-10-CM | POA: Diagnosis not present

## 2016-03-03 DIAGNOSIS — L570 Actinic keratosis: Secondary | ICD-10-CM | POA: Diagnosis not present

## 2016-03-03 DIAGNOSIS — Z96641 Presence of right artificial hip joint: Secondary | ICD-10-CM | POA: Diagnosis not present

## 2016-03-03 DIAGNOSIS — Z96652 Presence of left artificial knee joint: Secondary | ICD-10-CM | POA: Diagnosis not present

## 2016-03-03 DIAGNOSIS — M6281 Muscle weakness (generalized): Secondary | ICD-10-CM | POA: Diagnosis not present

## 2016-03-03 DIAGNOSIS — R2681 Unsteadiness on feet: Secondary | ICD-10-CM | POA: Diagnosis not present

## 2016-03-03 DIAGNOSIS — L821 Other seborrheic keratosis: Secondary | ICD-10-CM | POA: Diagnosis not present

## 2016-03-03 DIAGNOSIS — Z9181 History of falling: Secondary | ICD-10-CM | POA: Diagnosis not present

## 2016-03-03 DIAGNOSIS — M4646 Discitis, unspecified, lumbar region: Secondary | ICD-10-CM | POA: Diagnosis not present

## 2016-03-03 DIAGNOSIS — C44519 Basal cell carcinoma of skin of other part of trunk: Secondary | ICD-10-CM | POA: Diagnosis not present

## 2016-03-03 DIAGNOSIS — Z85828 Personal history of other malignant neoplasm of skin: Secondary | ICD-10-CM | POA: Diagnosis not present

## 2016-03-03 DIAGNOSIS — D485 Neoplasm of uncertain behavior of skin: Secondary | ICD-10-CM | POA: Diagnosis not present

## 2016-03-11 DIAGNOSIS — G894 Chronic pain syndrome: Secondary | ICD-10-CM | POA: Diagnosis not present

## 2016-03-11 DIAGNOSIS — M4646 Discitis, unspecified, lumbar region: Secondary | ICD-10-CM | POA: Diagnosis not present

## 2016-03-11 DIAGNOSIS — M8668 Other chronic osteomyelitis, other site: Secondary | ICD-10-CM | POA: Diagnosis not present

## 2016-03-17 DIAGNOSIS — Z96641 Presence of right artificial hip joint: Secondary | ICD-10-CM | POA: Diagnosis not present

## 2016-03-17 DIAGNOSIS — M5136 Other intervertebral disc degeneration, lumbar region: Secondary | ICD-10-CM | POA: Diagnosis not present

## 2016-03-17 DIAGNOSIS — M8668 Other chronic osteomyelitis, other site: Secondary | ICD-10-CM | POA: Diagnosis not present

## 2016-03-17 DIAGNOSIS — R2681 Unsteadiness on feet: Secondary | ICD-10-CM | POA: Diagnosis not present

## 2016-03-17 DIAGNOSIS — M4646 Discitis, unspecified, lumbar region: Secondary | ICD-10-CM | POA: Diagnosis not present

## 2016-03-17 DIAGNOSIS — M6281 Muscle weakness (generalized): Secondary | ICD-10-CM | POA: Diagnosis not present

## 2016-03-17 DIAGNOSIS — Z96652 Presence of left artificial knee joint: Secondary | ICD-10-CM | POA: Diagnosis not present

## 2016-03-17 DIAGNOSIS — Z9181 History of falling: Secondary | ICD-10-CM | POA: Diagnosis not present

## 2016-03-20 DIAGNOSIS — Z96652 Presence of left artificial knee joint: Secondary | ICD-10-CM | POA: Diagnosis not present

## 2016-03-20 DIAGNOSIS — R2681 Unsteadiness on feet: Secondary | ICD-10-CM | POA: Diagnosis not present

## 2016-03-20 DIAGNOSIS — M8668 Other chronic osteomyelitis, other site: Secondary | ICD-10-CM | POA: Diagnosis not present

## 2016-03-20 DIAGNOSIS — M5136 Other intervertebral disc degeneration, lumbar region: Secondary | ICD-10-CM | POA: Diagnosis not present

## 2016-03-20 DIAGNOSIS — Z9181 History of falling: Secondary | ICD-10-CM | POA: Diagnosis not present

## 2016-03-20 DIAGNOSIS — M4646 Discitis, unspecified, lumbar region: Secondary | ICD-10-CM | POA: Diagnosis not present

## 2016-03-20 DIAGNOSIS — M6281 Muscle weakness (generalized): Secondary | ICD-10-CM | POA: Diagnosis not present

## 2016-03-20 DIAGNOSIS — Z96641 Presence of right artificial hip joint: Secondary | ICD-10-CM | POA: Diagnosis not present

## 2016-03-23 DIAGNOSIS — Z9181 History of falling: Secondary | ICD-10-CM | POA: Diagnosis not present

## 2016-03-23 DIAGNOSIS — M4646 Discitis, unspecified, lumbar region: Secondary | ICD-10-CM | POA: Diagnosis not present

## 2016-03-23 DIAGNOSIS — Z96652 Presence of left artificial knee joint: Secondary | ICD-10-CM | POA: Diagnosis not present

## 2016-03-23 DIAGNOSIS — M6281 Muscle weakness (generalized): Secondary | ICD-10-CM | POA: Diagnosis not present

## 2016-03-23 DIAGNOSIS — R2681 Unsteadiness on feet: Secondary | ICD-10-CM | POA: Diagnosis not present

## 2016-03-23 DIAGNOSIS — M5136 Other intervertebral disc degeneration, lumbar region: Secondary | ICD-10-CM | POA: Diagnosis not present

## 2016-03-23 DIAGNOSIS — M8668 Other chronic osteomyelitis, other site: Secondary | ICD-10-CM | POA: Diagnosis not present

## 2016-03-23 DIAGNOSIS — Z96641 Presence of right artificial hip joint: Secondary | ICD-10-CM | POA: Diagnosis not present

## 2016-03-24 ENCOUNTER — Encounter: Payer: Self-pay | Admitting: Internal Medicine

## 2016-03-24 ENCOUNTER — Other Ambulatory Visit (HOSPITAL_COMMUNITY): Payer: Self-pay | Admitting: Neurosurgery

## 2016-03-24 ENCOUNTER — Ambulatory Visit (INDEPENDENT_AMBULATORY_CARE_PROVIDER_SITE_OTHER): Payer: PPO | Admitting: Internal Medicine

## 2016-03-24 VITALS — BP 90/50 | HR 60

## 2016-03-24 DIAGNOSIS — K219 Gastro-esophageal reflux disease without esophagitis: Secondary | ICD-10-CM

## 2016-03-24 DIAGNOSIS — M4646 Discitis, unspecified, lumbar region: Secondary | ICD-10-CM

## 2016-03-24 MED ORDER — OMEPRAZOLE 40 MG PO CPDR: 40.0000 mg | DELAYED_RELEASE_CAPSULE | Freq: Every day | ORAL | 6 refills | Status: DC

## 2016-03-24 NOTE — Patient Instructions (Signed)
We have sent the following medications to your pharmacy for you to pick up at your convenience:  Omeprazole  Please follow up as needed  

## 2016-03-24 NOTE — Progress Notes (Signed)
HISTORY OF PRESENT ILLNESS:  Derrick Sanchez is a 82 y.o. male with multiple medical problems as listed below and postoperative complications from back surgery with epidural abscess who is referred today by his primary provider regarding inability to cough. Patient has been previously evaluated by Dr. Verl Blalock. Most recent office note January 2015 describes chronic GERD associated with esophageal motility disorder, possibly scleroderma esophagus but no symptoms or signs of systemic sclerosis. He had on previous PPI but not recently. His last upper endoscopy was performed July 2013. He was found to have a hiatal hernia but otherwise normal exam. Empiric dilation with 54 French Maloney dilator. Patient does have severe kyphosis and chronic back brace. He describes occasional regurgitation of gastric contents but no dysphagia to food or pills. His chief complaint is inability to clear thick phlegm from his throat. He states that he would like to cough, but cannot generate a strong cough secondary to his back issues. No other relevant issues  REVIEW OF SYSTEMS:  All non-GI ROS negative except for chronic lower extremity edema, insomnia  Past Medical History:  Diagnosis Date  . Allergy   . Arthritis   . Blood transfusion   . Candidiasis of the esophagus 07-17-2000   EGD  . Cataract    bil eyes  . Colon polyps 05-27-2002   Tubulovillous Adenoma-Colonoscopy  . Diverticulosis of colon (without mention of hemorrhage) 2008,2006,2003   Colonoscopy  . Esophageal reflux 2008   EGD  . Glaucoma   . Hiatal hernia 2002,2003,2013   EGD  . Hyperparathyroidism, unspecified   . Hypertension   . Internal hemorrhoids without mention of complication 3785   Colonoscopy  . Multiple falls    Pt falls asleep without warning and has encounter multiple falls  . Obstructive sleep apnea (adult) (pediatric)   . Other vitamin B12 deficiency anemia   . Pneumonia    years ago  . Restless legs syndrome (RLS)    . Stricture and stenosis of esophagus 05-27-2002   EGD  . Unspecified asthma(493.90)     Past Surgical History:  Procedure Laterality Date  . arm surgery    . CATARACT EXTRACTION Bilateral   . COLONOSCOPY    . EYE SURGERY Bilateral    cataracts iol  . HIP SURGERY Right 1998   replacement  . LUMBAR FUSION  x 3   most recent June 2016  . LUMBAR LAMINECTOMY/ DECOMPRESSION WITH MET-RX N/A 11/23/2014   Procedure: HARDWARE REMOVAL WITH MET-RX;  Surgeon: Jovita Gamma, MD;  Location: [place] [facility];  Service: Neurosurgery;  Laterality: N/A;  removal of lumbar posterior instrumentation  . LUMBAR WOUND DEBRIDEMENT N/A 03/12/2014   Procedure: LUMBAR WOUND DEBRIDEMENT;  Surgeon: Elaina Hoops, MD;  Location: [place] [facility];  Service: Neurosurgery;  Laterality: N/A;  LUMBAR WOUND DEBRIDEMENT  . MASS EXCISION  10/14/2011   Procedure: EXCISION MASS;  Surgeon: Wynonia Sours, MD;  Location: [facility];  Service: Orthopedics;  Laterality: Left;   left hand  . PARATHYROIDECTOMY  2008  . PICC LINE PLACE PERIPHERAL ([place] HX) Left   . TOTAL KNEE ARTHROPLASTY Left 1998   replacement    Social History Derrick Sanchez  reports that he has never smoked. He has never used smokeless tobacco. He reports that he does not drink alcohol or use drugs.  family history includes Heart disease in his father and sister; Leukemia in his brother.  No Known Allergies     PHYSICAL EXAMINATION: Vital signs: BP Marland Kitchen)  90/50 (BP Location: Left Arm, Patient Position: Sitting, Cuff Size: Normal)   Pulse 60   Constitutional: Pleasant, frail, chronically ill-appearing, kyphotic, large back brace, in wheelchair, no acute distress Psychiatric: alert and oriented x3, cooperative Eyes: extraocular movements intact, anicteric, conjunctiva pink Mouth: oral pharynx moist, no lesions. No thrush Neck: supple without thyromegaly Lymph: no supraclavicular lymphadenopathy Cardiovascular: heart regular rate and rhythm, no  murmur Lungs: clear to auscultation bilaterally, but decreased breath sounds at bases omitted Abdomen: soft, nontender, nondistended, no obvious ascites, no peritoneal signs, normal bowel sounds, no organomegaly Rectal: Omitted Extremities: no clubbing or cyanosis. Marked lower extremity edema bilaterally. Chronic stasis changes. Wrapped bilaterally Skin: no additional lesions on visible extremities Neuro: No focal deficits. Cranial nerves intact  ASSESSMENT:  #1. Pharyngeal phlegm. Difficult time coughing the secondary to back issues. Multiple potential causes for phlegm including GERD. He is not having cough or dysphagia. Previous upper endoscopy as described.  PLAN:  #1. Reflux precautions #2. Empiric trial of omeprazole 40 mg daily for 2 months. If this helps, continue; if not, discontinue and see PCP regarding other potential causes for phlegm and assistance with expectoration  25 minutes spent face-to-face with the patient. The majority of the time issues for counseling regarding his pharyngeal complaints and the treatment plan as outlined as well as the rationale

## 2016-03-27 DIAGNOSIS — S46011A Strain of muscle(s) and tendon(s) of the rotator cuff of right shoulder, initial encounter: Secondary | ICD-10-CM | POA: Diagnosis not present

## 2016-03-28 ENCOUNTER — Telehealth: Payer: Self-pay | Admitting: Internal Medicine

## 2016-03-28 NOTE — Telephone Encounter (Signed)
Called pharmacy to ask about the omeprazole prescription, They stated that Dr Willey Blade sent in Nexium for the patient and the patient picked that up today. I called patient back and explained to him that omeprazole and Nexium are both the same type of medication and he said ok and that he would take the Nexium....... [facility] has omeprazole on file for patient in case he doesn't want to take the Nexium prescribed by Dr Willey Blade

## 2016-04-02 ENCOUNTER — Ambulatory Visit (HOSPITAL_COMMUNITY)
Admission: RE | Admit: 2016-04-02 | Discharge: 2016-04-02 | Disposition: A | Discharge status: Home / Self Care | Payer: PPO | Source: Ambulatory Visit | Attending: Neurosurgery | Admitting: Neurosurgery

## 2016-04-02 DIAGNOSIS — M48061 Spinal stenosis, lumbar region without neurogenic claudication: Secondary | ICD-10-CM | POA: Diagnosis not present

## 2016-04-02 DIAGNOSIS — M4626 Osteomyelitis of vertebra, lumbar region: Secondary | ICD-10-CM | POA: Diagnosis not present

## 2016-04-02 DIAGNOSIS — M4646 Discitis, unspecified, lumbar region: Secondary | ICD-10-CM | POA: Diagnosis not present

## 2016-04-03 DIAGNOSIS — R2681 Unsteadiness on feet: Secondary | ICD-10-CM | POA: Diagnosis not present

## 2016-04-03 DIAGNOSIS — Z9181 History of falling: Secondary | ICD-10-CM | POA: Diagnosis not present

## 2016-04-03 DIAGNOSIS — M6281 Muscle weakness (generalized): Secondary | ICD-10-CM | POA: Diagnosis not present

## 2016-04-03 DIAGNOSIS — M5136 Other intervertebral disc degeneration, lumbar region: Secondary | ICD-10-CM | POA: Diagnosis not present

## 2016-04-03 DIAGNOSIS — M4646 Discitis, unspecified, lumbar region: Secondary | ICD-10-CM | POA: Diagnosis not present

## 2016-04-03 DIAGNOSIS — M8668 Other chronic osteomyelitis, other site: Secondary | ICD-10-CM | POA: Diagnosis not present

## 2016-04-03 DIAGNOSIS — Z96641 Presence of right artificial hip joint: Secondary | ICD-10-CM | POA: Diagnosis not present

## 2016-04-03 DIAGNOSIS — Z96652 Presence of left artificial knee joint: Secondary | ICD-10-CM | POA: Diagnosis not present

## 2016-04-09 ENCOUNTER — Telehealth: Payer: Self-pay | Admitting: Internal Medicine

## 2016-04-09 NOTE — Telephone Encounter (Signed)
Attempted to call pt back but no answer.

## 2016-04-10 DIAGNOSIS — L0390 Cellulitis, unspecified: Secondary | ICD-10-CM | POA: Diagnosis not present

## 2016-04-10 DIAGNOSIS — R609 Edema, unspecified: Secondary | ICD-10-CM | POA: Diagnosis not present

## 2016-04-10 NOTE — Telephone Encounter (Signed)
Pt has been taking the nexium that his PCP wrote for him and states it is not helping with his reflux. Dr. Henrene Pastor prescribed omeprazole 40mg  daily. Pt instructed to pick this script up from the pharmacy and to try taking this medication. Pt vebalized understanding.

## 2016-04-14 DIAGNOSIS — K5900 Constipation, unspecified: Secondary | ICD-10-CM | POA: Diagnosis not present

## 2016-04-14 DIAGNOSIS — M6281 Muscle weakness (generalized): Secondary | ICD-10-CM | POA: Diagnosis not present

## 2016-04-14 DIAGNOSIS — R600 Localized edema: Secondary | ICD-10-CM | POA: Diagnosis not present

## 2016-04-14 DIAGNOSIS — R2681 Unsteadiness on feet: Secondary | ICD-10-CM | POA: Diagnosis not present

## 2016-04-14 DIAGNOSIS — D519 Vitamin B12 deficiency anemia, unspecified: Secondary | ICD-10-CM | POA: Diagnosis not present

## 2016-04-14 DIAGNOSIS — M8668 Other chronic osteomyelitis, other site: Secondary | ICD-10-CM | POA: Diagnosis not present

## 2016-04-14 DIAGNOSIS — M5136 Other intervertebral disc degeneration, lumbar region: Secondary | ICD-10-CM | POA: Diagnosis not present

## 2016-04-14 DIAGNOSIS — M4646 Discitis, unspecified, lumbar region: Secondary | ICD-10-CM | POA: Diagnosis not present

## 2016-04-14 DIAGNOSIS — Z96641 Presence of right artificial hip joint: Secondary | ICD-10-CM | POA: Diagnosis not present

## 2016-04-14 DIAGNOSIS — M545 Low back pain: Secondary | ICD-10-CM | POA: Diagnosis not present

## 2016-04-15 ENCOUNTER — Telehealth: Payer: Self-pay | Admitting: Internal Medicine

## 2016-04-15 NOTE — Telephone Encounter (Signed)
I told him to stay on this twice daily for 2 months to see if this helps his symptoms. If not, the cause is something other than GI or reflux in which case he should return to his PCP

## 2016-04-15 NOTE — Telephone Encounter (Signed)
Pt states the omeprazole 40mg  daily has not helped him. Reports he is still having problems with hoarseness and thick phlegm in his throat. Please advise.

## 2016-04-15 NOTE — Telephone Encounter (Signed)
Spoke with pt and he is aware, will contact his PCP.

## 2016-04-17 DIAGNOSIS — R2681 Unsteadiness on feet: Secondary | ICD-10-CM | POA: Diagnosis not present

## 2016-04-17 DIAGNOSIS — R600 Localized edema: Secondary | ICD-10-CM | POA: Diagnosis not present

## 2016-04-17 DIAGNOSIS — M6281 Muscle weakness (generalized): Secondary | ICD-10-CM | POA: Diagnosis not present

## 2016-04-17 DIAGNOSIS — D519 Vitamin B12 deficiency anemia, unspecified: Secondary | ICD-10-CM | POA: Diagnosis not present

## 2016-04-17 DIAGNOSIS — M8668 Other chronic osteomyelitis, other site: Secondary | ICD-10-CM | POA: Diagnosis not present

## 2016-04-17 DIAGNOSIS — K5900 Constipation, unspecified: Secondary | ICD-10-CM | POA: Diagnosis not present

## 2016-04-17 DIAGNOSIS — M5136 Other intervertebral disc degeneration, lumbar region: Secondary | ICD-10-CM | POA: Diagnosis not present

## 2016-04-17 DIAGNOSIS — M4646 Discitis, unspecified, lumbar region: Secondary | ICD-10-CM | POA: Diagnosis not present

## 2016-04-17 DIAGNOSIS — Z96641 Presence of right artificial hip joint: Secondary | ICD-10-CM | POA: Diagnosis not present

## 2016-04-17 DIAGNOSIS — M545 Low back pain: Secondary | ICD-10-CM | POA: Diagnosis not present

## 2016-04-18 DIAGNOSIS — M5136 Other intervertebral disc degeneration, lumbar region: Secondary | ICD-10-CM | POA: Diagnosis not present

## 2016-04-18 DIAGNOSIS — M6281 Muscle weakness (generalized): Secondary | ICD-10-CM | POA: Diagnosis not present

## 2016-04-18 DIAGNOSIS — Z96641 Presence of right artificial hip joint: Secondary | ICD-10-CM | POA: Diagnosis not present

## 2016-04-18 DIAGNOSIS — M4646 Discitis, unspecified, lumbar region: Secondary | ICD-10-CM | POA: Diagnosis not present

## 2016-04-18 DIAGNOSIS — K5900 Constipation, unspecified: Secondary | ICD-10-CM | POA: Diagnosis not present

## 2016-04-18 DIAGNOSIS — M8668 Other chronic osteomyelitis, other site: Secondary | ICD-10-CM | POA: Diagnosis not present

## 2016-04-18 DIAGNOSIS — R600 Localized edema: Secondary | ICD-10-CM | POA: Diagnosis not present

## 2016-04-18 DIAGNOSIS — M545 Low back pain: Secondary | ICD-10-CM | POA: Diagnosis not present

## 2016-04-18 DIAGNOSIS — I872 Venous insufficiency (chronic) (peripheral): Secondary | ICD-10-CM | POA: Diagnosis not present

## 2016-04-18 DIAGNOSIS — R2681 Unsteadiness on feet: Secondary | ICD-10-CM | POA: Diagnosis not present

## 2016-04-18 DIAGNOSIS — D519 Vitamin B12 deficiency anemia, unspecified: Secondary | ICD-10-CM | POA: Diagnosis not present

## 2016-04-19 DIAGNOSIS — M8668 Other chronic osteomyelitis, other site: Secondary | ICD-10-CM | POA: Diagnosis not present

## 2016-04-19 DIAGNOSIS — M47816 Spondylosis without myelopathy or radiculopathy, lumbar region: Secondary | ICD-10-CM | POA: Diagnosis not present

## 2016-04-19 DIAGNOSIS — M4646 Discitis, unspecified, lumbar region: Secondary | ICD-10-CM | POA: Diagnosis not present

## 2016-04-23 DIAGNOSIS — M4646 Discitis, unspecified, lumbar region: Secondary | ICD-10-CM | POA: Diagnosis not present

## 2016-04-23 DIAGNOSIS — R2681 Unsteadiness on feet: Secondary | ICD-10-CM | POA: Diagnosis not present

## 2016-04-23 DIAGNOSIS — D519 Vitamin B12 deficiency anemia, unspecified: Secondary | ICD-10-CM | POA: Diagnosis not present

## 2016-04-23 DIAGNOSIS — Z96641 Presence of right artificial hip joint: Secondary | ICD-10-CM | POA: Diagnosis not present

## 2016-04-23 DIAGNOSIS — R600 Localized edema: Secondary | ICD-10-CM | POA: Diagnosis not present

## 2016-04-23 DIAGNOSIS — K5900 Constipation, unspecified: Secondary | ICD-10-CM | POA: Diagnosis not present

## 2016-04-23 DIAGNOSIS — M5136 Other intervertebral disc degeneration, lumbar region: Secondary | ICD-10-CM | POA: Diagnosis not present

## 2016-04-23 DIAGNOSIS — M8668 Other chronic osteomyelitis, other site: Secondary | ICD-10-CM | POA: Diagnosis not present

## 2016-04-23 DIAGNOSIS — M6281 Muscle weakness (generalized): Secondary | ICD-10-CM | POA: Diagnosis not present

## 2016-04-23 DIAGNOSIS — M545 Low back pain: Secondary | ICD-10-CM | POA: Diagnosis not present

## 2016-04-24 DIAGNOSIS — G2581 Restless legs syndrome: Secondary | ICD-10-CM | POA: Diagnosis not present

## 2016-04-24 DIAGNOSIS — Z79899 Other long term (current) drug therapy: Secondary | ICD-10-CM | POA: Diagnosis not present

## 2016-04-24 DIAGNOSIS — I872 Venous insufficiency (chronic) (peripheral): Secondary | ICD-10-CM | POA: Diagnosis not present

## 2016-04-24 DIAGNOSIS — I83022 Varicose veins of left lower extremity with ulcer of calf: Secondary | ICD-10-CM | POA: Diagnosis not present

## 2016-04-24 DIAGNOSIS — L97828 Non-pressure chronic ulcer of other part of left lower leg with other specified severity: Secondary | ICD-10-CM | POA: Diagnosis not present

## 2016-04-24 DIAGNOSIS — L97222 Non-pressure chronic ulcer of left calf with fat layer exposed: Secondary | ICD-10-CM | POA: Diagnosis not present

## 2016-04-24 DIAGNOSIS — G629 Polyneuropathy, unspecified: Secondary | ICD-10-CM | POA: Diagnosis not present

## 2016-04-24 DIAGNOSIS — K219 Gastro-esophageal reflux disease without esophagitis: Secondary | ICD-10-CM | POA: Diagnosis not present

## 2016-04-25 DIAGNOSIS — G894 Chronic pain syndrome: Secondary | ICD-10-CM | POA: Diagnosis not present

## 2016-04-25 DIAGNOSIS — M4646 Discitis, unspecified, lumbar region: Secondary | ICD-10-CM | POA: Diagnosis not present

## 2016-04-25 DIAGNOSIS — G8220 Paraplegia, unspecified: Secondary | ICD-10-CM | POA: Diagnosis not present

## 2016-04-25 DIAGNOSIS — M8668 Other chronic osteomyelitis, other site: Secondary | ICD-10-CM | POA: Diagnosis not present

## 2016-04-29 DIAGNOSIS — M79605 Pain in left leg: Secondary | ICD-10-CM | POA: Diagnosis not present

## 2016-04-29 DIAGNOSIS — M79661 Pain in right lower leg: Secondary | ICD-10-CM | POA: Diagnosis not present

## 2016-04-29 DIAGNOSIS — I872 Venous insufficiency (chronic) (peripheral): Secondary | ICD-10-CM | POA: Diagnosis not present

## 2016-04-29 DIAGNOSIS — R600 Localized edema: Secondary | ICD-10-CM | POA: Diagnosis not present

## 2016-04-29 DIAGNOSIS — M79604 Pain in right leg: Secondary | ICD-10-CM | POA: Diagnosis not present

## 2016-04-29 DIAGNOSIS — I83029 Varicose veins of left lower extremity with ulcer of unspecified site: Secondary | ICD-10-CM | POA: Diagnosis not present

## 2016-05-01 DIAGNOSIS — I83022 Varicose veins of left lower extremity with ulcer of calf: Secondary | ICD-10-CM | POA: Diagnosis not present

## 2016-05-01 DIAGNOSIS — I872 Venous insufficiency (chronic) (peripheral): Secondary | ICD-10-CM | POA: Diagnosis not present

## 2016-05-01 DIAGNOSIS — Z79899 Other long term (current) drug therapy: Secondary | ICD-10-CM | POA: Diagnosis not present

## 2016-05-01 DIAGNOSIS — L97828 Non-pressure chronic ulcer of other part of left lower leg with other specified severity: Secondary | ICD-10-CM | POA: Diagnosis not present

## 2016-05-01 DIAGNOSIS — K219 Gastro-esophageal reflux disease without esophagitis: Secondary | ICD-10-CM | POA: Diagnosis not present

## 2016-05-01 DIAGNOSIS — L97222 Non-pressure chronic ulcer of left calf with fat layer exposed: Secondary | ICD-10-CM | POA: Diagnosis not present

## 2016-05-01 DIAGNOSIS — G629 Polyneuropathy, unspecified: Secondary | ICD-10-CM | POA: Diagnosis not present

## 2016-05-01 DIAGNOSIS — G2581 Restless legs syndrome: Secondary | ICD-10-CM | POA: Diagnosis not present

## 2016-05-03 DIAGNOSIS — M8668 Other chronic osteomyelitis, other site: Secondary | ICD-10-CM | POA: Diagnosis not present

## 2016-05-03 DIAGNOSIS — M4646 Discitis, unspecified, lumbar region: Secondary | ICD-10-CM | POA: Diagnosis not present

## 2016-05-03 DIAGNOSIS — G8220 Paraplegia, unspecified: Secondary | ICD-10-CM | POA: Diagnosis not present

## 2016-05-05 DIAGNOSIS — R600 Localized edema: Secondary | ICD-10-CM | POA: Diagnosis not present

## 2016-05-05 DIAGNOSIS — M545 Low back pain: Secondary | ICD-10-CM | POA: Diagnosis not present

## 2016-05-05 DIAGNOSIS — M4646 Discitis, unspecified, lumbar region: Secondary | ICD-10-CM | POA: Diagnosis not present

## 2016-05-05 DIAGNOSIS — M6281 Muscle weakness (generalized): Secondary | ICD-10-CM | POA: Diagnosis not present

## 2016-05-05 DIAGNOSIS — K5900 Constipation, unspecified: Secondary | ICD-10-CM | POA: Diagnosis not present

## 2016-05-05 DIAGNOSIS — Z96641 Presence of right artificial hip joint: Secondary | ICD-10-CM | POA: Diagnosis not present

## 2016-05-05 DIAGNOSIS — M5136 Other intervertebral disc degeneration, lumbar region: Secondary | ICD-10-CM | POA: Diagnosis not present

## 2016-05-05 DIAGNOSIS — R2681 Unsteadiness on feet: Secondary | ICD-10-CM | POA: Diagnosis not present

## 2016-05-05 DIAGNOSIS — M8668 Other chronic osteomyelitis, other site: Secondary | ICD-10-CM | POA: Diagnosis not present

## 2016-05-05 DIAGNOSIS — D519 Vitamin B12 deficiency anemia, unspecified: Secondary | ICD-10-CM | POA: Diagnosis not present

## 2016-05-14 DIAGNOSIS — L97222 Non-pressure chronic ulcer of left calf with fat layer exposed: Secondary | ICD-10-CM | POA: Diagnosis not present

## 2016-05-14 DIAGNOSIS — I83022 Varicose veins of left lower extremity with ulcer of calf: Secondary | ICD-10-CM | POA: Diagnosis not present

## 2016-05-15 DIAGNOSIS — L89229 Pressure ulcer of left hip, unspecified stage: Secondary | ICD-10-CM | POA: Diagnosis not present

## 2016-05-15 DIAGNOSIS — R609 Edema, unspecified: Secondary | ICD-10-CM | POA: Diagnosis not present

## 2016-05-16 DIAGNOSIS — Z96641 Presence of right artificial hip joint: Secondary | ICD-10-CM | POA: Diagnosis not present

## 2016-05-16 DIAGNOSIS — K5900 Constipation, unspecified: Secondary | ICD-10-CM | POA: Diagnosis not present

## 2016-05-16 DIAGNOSIS — M6281 Muscle weakness (generalized): Secondary | ICD-10-CM | POA: Diagnosis not present

## 2016-05-16 DIAGNOSIS — M8668 Other chronic osteomyelitis, other site: Secondary | ICD-10-CM | POA: Diagnosis not present

## 2016-05-16 DIAGNOSIS — D519 Vitamin B12 deficiency anemia, unspecified: Secondary | ICD-10-CM | POA: Diagnosis not present

## 2016-05-16 DIAGNOSIS — M5136 Other intervertebral disc degeneration, lumbar region: Secondary | ICD-10-CM | POA: Diagnosis not present

## 2016-05-16 DIAGNOSIS — R2681 Unsteadiness on feet: Secondary | ICD-10-CM | POA: Diagnosis not present

## 2016-05-16 DIAGNOSIS — M545 Low back pain: Secondary | ICD-10-CM | POA: Diagnosis not present

## 2016-05-16 DIAGNOSIS — M4646 Discitis, unspecified, lumbar region: Secondary | ICD-10-CM | POA: Diagnosis not present

## 2016-05-16 DIAGNOSIS — R600 Localized edema: Secondary | ICD-10-CM | POA: Diagnosis not present

## 2016-05-19 ENCOUNTER — Telehealth: Payer: Self-pay | Admitting: *Deleted

## 2016-05-19 NOTE — Telephone Encounter (Signed)
Patient's son Derrick Sanchez called.  He recently saw his Neurosurgeon Dr Carloyn Manner, had a MRI to see if anything further can be done to increase his quality of life. Derrick Sanchez is now wheelchair bound, some due to chronic pain and some due to chronic weakness. Per Dr Carloyn Manner, Derrick Sanchez is not a surgical candidate.  Per Derrick Sanchez, the vertebra where the infection occurred are now 10-15% the size of a normal vertebra.  He is wondering if this is damage done, or if this could be a result of current infection. He is especially worried as his father had recurrent infection in May 2017.  Derrick Sanchez will ask Dr Carloyn Manner to fax the report to Dr Johnnye Sima for review. [name], RN

## 2016-05-22 DIAGNOSIS — I872 Venous insufficiency (chronic) (peripheral): Secondary | ICD-10-CM | POA: Diagnosis not present

## 2016-05-22 DIAGNOSIS — I83022 Varicose veins of left lower extremity with ulcer of calf: Secondary | ICD-10-CM | POA: Diagnosis not present

## 2016-05-22 DIAGNOSIS — L97222 Non-pressure chronic ulcer of left calf with fat layer exposed: Secondary | ICD-10-CM | POA: Diagnosis not present

## 2016-05-22 DIAGNOSIS — I8390 Asymptomatic varicose veins of unspecified lower extremity: Secondary | ICD-10-CM | POA: Diagnosis not present

## 2016-06-12 DIAGNOSIS — R05 Cough: Secondary | ICD-10-CM | POA: Diagnosis not present

## 2016-06-12 DIAGNOSIS — R609 Edema, unspecified: Secondary | ICD-10-CM | POA: Diagnosis not present

## 2016-06-13 DIAGNOSIS — M4646 Discitis, unspecified, lumbar region: Secondary | ICD-10-CM | POA: Diagnosis not present

## 2016-06-13 DIAGNOSIS — G8220 Paraplegia, unspecified: Secondary | ICD-10-CM | POA: Diagnosis not present

## 2016-06-13 DIAGNOSIS — G894 Chronic pain syndrome: Secondary | ICD-10-CM | POA: Diagnosis not present

## 2016-06-13 DIAGNOSIS — M8668 Other chronic osteomyelitis, other site: Secondary | ICD-10-CM | POA: Diagnosis not present

## 2016-06-18 DIAGNOSIS — M4646 Discitis, unspecified, lumbar region: Secondary | ICD-10-CM | POA: Diagnosis not present

## 2016-06-18 DIAGNOSIS — M8668 Other chronic osteomyelitis, other site: Secondary | ICD-10-CM | POA: Diagnosis not present

## 2016-06-24 DIAGNOSIS — I872 Venous insufficiency (chronic) (peripheral): Secondary | ICD-10-CM | POA: Diagnosis not present

## 2016-06-25 DIAGNOSIS — I872 Venous insufficiency (chronic) (peripheral): Secondary | ICD-10-CM | POA: Diagnosis not present

## 2016-06-25 DIAGNOSIS — X58XXXA Exposure to other specified factors, initial encounter: Secondary | ICD-10-CM | POA: Diagnosis not present

## 2016-06-25 DIAGNOSIS — S81812A Laceration without foreign body, left lower leg, initial encounter: Secondary | ICD-10-CM | POA: Diagnosis not present

## 2016-06-25 DIAGNOSIS — I8393 Asymptomatic varicose veins of bilateral lower extremities: Secondary | ICD-10-CM | POA: Diagnosis not present

## 2016-06-25 DIAGNOSIS — S80812A Abrasion, left lower leg, initial encounter: Secondary | ICD-10-CM | POA: Diagnosis not present

## 2016-07-03 ENCOUNTER — Ambulatory Visit: Payer: PPO | Admitting: Internal Medicine

## 2016-07-07 DIAGNOSIS — M4620 Osteomyelitis of vertebra, site unspecified: Secondary | ICD-10-CM | POA: Diagnosis not present

## 2016-07-07 DIAGNOSIS — I872 Venous insufficiency (chronic) (peripheral): Secondary | ICD-10-CM | POA: Diagnosis not present

## 2016-07-07 DIAGNOSIS — R609 Edema, unspecified: Secondary | ICD-10-CM | POA: Diagnosis not present

## 2016-07-07 DIAGNOSIS — E538 Deficiency of other specified B group vitamins: Secondary | ICD-10-CM | POA: Diagnosis not present

## 2016-07-07 DIAGNOSIS — E213 Hyperparathyroidism, unspecified: Secondary | ICD-10-CM | POA: Diagnosis not present

## 2016-07-07 DIAGNOSIS — R21 Rash and other nonspecific skin eruption: Secondary | ICD-10-CM | POA: Diagnosis not present

## 2016-07-07 DIAGNOSIS — G2581 Restless legs syndrome: Secondary | ICD-10-CM | POA: Diagnosis not present

## 2016-07-07 DIAGNOSIS — J45909 Unspecified asthma, uncomplicated: Secondary | ICD-10-CM | POA: Diagnosis not present

## 2016-07-07 DIAGNOSIS — Z79899 Other long term (current) drug therapy: Secondary | ICD-10-CM | POA: Diagnosis not present

## 2016-07-07 DIAGNOSIS — M1990 Unspecified osteoarthritis, unspecified site: Secondary | ICD-10-CM | POA: Diagnosis not present

## 2016-07-09 DIAGNOSIS — S80812D Abrasion, left lower leg, subsequent encounter: Secondary | ICD-10-CM | POA: Diagnosis not present

## 2016-07-14 NOTE — Telephone Encounter (Signed)
Patient's son calling to see if Dr Johnnye Sima ever received the records and Imaging from Dr Roy's office after the call 05/19/16. This may be the CT from 04/02/16. RN will contact Dr Roy's office to request the office notes, to confirm if there were any other image studies ordered.  [name] is just looking to see if anything can be done to prevent the vertebra from complete collapse from Dr Algis Downs perspective. He would especially like to know if there is continued infection or worsening infection.  He states his father is still wheelchair bound, unable to stand. [name], RN

## 2016-07-15 NOTE — Telephone Encounter (Signed)
Spoke with pt's son, at length.  He needs f/u appt

## 2016-07-23 ENCOUNTER — Encounter: Payer: Self-pay | Admitting: Nurse Practitioner

## 2016-07-23 ENCOUNTER — Ambulatory Visit (INDEPENDENT_AMBULATORY_CARE_PROVIDER_SITE_OTHER): Payer: PPO | Admitting: Nurse Practitioner

## 2016-07-23 VITALS — BP 130/60 | HR 64 | Ht 69.0 in | Wt 150.0 lb

## 2016-07-23 DIAGNOSIS — R1310 Dysphagia, unspecified: Secondary | ICD-10-CM | POA: Diagnosis not present

## 2016-07-23 DIAGNOSIS — K219 Gastro-esophageal reflux disease without esophagitis: Secondary | ICD-10-CM

## 2016-07-23 NOTE — Progress Notes (Signed)
HPI:  Patient is an 83 year old male, previously followed by Dr. Sharlett Iles, now under care of Dr. Henrene Pastor. He has chronic GERD / esophageal dysmotility. . His last EGD was in 2013 with findings of a hiatal hernia . He was empirically dilated and did fairly well until a few weeks ago. Now with recurrent dysphagia to solids and liquids. After the first few bites of food his esophagus feels full and he then begins to regurgitate. No odynophagia. Levaquin listed on home med list but patient says he hasn't had any recent antibiotics to suggest candida. No chest discomfort. He has a lot of phlegm in throat.    Past Medical History:  Diagnosis Date  . Allergy   . Arthritis   . Blood transfusion   . Candidiasis of the esophagus 07-17-2000   EGD  . Cataract    bil eyes  . Colon polyps 05-27-2002   Tubulovillous Adenoma-Colonoscopy  . Diverticulosis of colon (without mention of hemorrhage) 2008,2006,2003   Colonoscopy  . Esophageal reflux 2008   EGD  . Glaucoma   . Hiatal hernia 2002,2003,2013   EGD  . Hyperparathyroidism, unspecified   . Hypertension   . Internal hemorrhoids without mention of complication 2025   Colonoscopy  . Multiple falls    Pt falls asleep without warning and has encounter multiple falls  . Obstructive sleep apnea (adult) (pediatric)   . Other vitamin B12 deficiency anemia   . Pneumonia    years ago  . Restless legs syndrome (RLS)   . Stricture and stenosis of esophagus 05-27-2002   EGD  . Unspecified asthma(493.90)     Past Surgical History:  Procedure Laterality Date  . arm surgery    . CATARACT EXTRACTION Bilateral   . COLONOSCOPY    . EYE SURGERY Bilateral    cataracts iol  . HIP SURGERY Right 1998   replacement  . LUMBAR FUSION  x 3   most recent June 2016  . LUMBAR LAMINECTOMY/ DECOMPRESSION WITH MET-RX N/A 11/23/2014   Procedure: HARDWARE REMOVAL WITH MET-RX;  Surgeon: Jovita Gamma, MD;  Location: [place] [facility]UR[facility];  Service: Neurosurgery;   Laterality: N/A;  removal of lumbar posterior instrumentation  . LUMBAR WOUND DEBRIDEMENT N/A 03/12/2014   Procedure: LUMBAR WOUND DEBRIDEMENT;  Surgeon: Elaina Hoops, MD;  Location: [place] NEUR[facility] ORS;  Service: Neurosurgery;  Laterality: N/A;  LUMBAR WOUND DEBRIDEMENT  . MASS EXCISION  10/14/2011   Procedure: EXCISION MASS;  Surgeon: Wynonia Sours, MD;  Location: [facility];  Service: Orthopedics;  Laterality: Left;   left hand  . PARATHYROIDECTOMY  2008  . PICC LINE PLACE PERIPHERAL ([place] HX) Left   . TOTAL KNEE ARTHROPLASTY Left 1998   replacement   Family History  Problem Relation Age of Onset  . Heart disease Father   . Brain cancer      3 brothers  . Heart disease Sister   . Leukemia Brother   . Colon cancer Neg Hx   . Esophageal cancer Neg Hx   . Rectal cancer Neg Hx   . Stomach cancer Neg Hx   . Diabetes Neg Hx   . Kidney disease Neg Hx   . Liver disease Neg Hx    Social History  Substance Use Topics  . Smoking status: Never Smoker  . Smokeless tobacco: Never Used  . Alcohol use No   Current Outpatient Prescriptions  Medication Sig Dispense Refill  . B Complex-C (B-COMPLEX WITH VITAMIN C)  tablet Take 1 tablet by mouth daily.    . bimatoprost (LUMIGAN) 0.01 % SOLN Place 1 drop into both eyes at bedtime.    Marland Kitchen esomeprazole (NEXIUM) 40 MG capsule Take 1 capsule (40 mg total) by mouth daily at 12 noon. This was filled by Dr [name]    . ferrous sulfate 325 (65 FE) MG tablet Take 325 mg by mouth daily with breakfast.    . gabapentin (NEURONTIN) 300 MG capsule Take 300-600 mg by mouth 3 (three) times daily. Take 2 capsules (600 mg) every morning, 1 capsule (300 mg) at noon, 2 capsules (600 mg) at bedtime    . levofloxacin (LEVAQUIN) 500 MG tablet Take 1 tablet (500 mg total) by mouth daily. 90 tablet 3  . omeprazole (PRILOSEC) 40 MG capsule Take 1 capsule (40 mg total) by mouth daily. 30 capsule 6  . pramipexole (MIRAPEX) 1 MG tablet Take 2 mg by mouth 3 (three) times  daily.      No current facility-administered medications for this visit.    No Known Allergies   Review of Systems: Positive chronic back pain. All other systems reviewed and negative except where noted in HPI.   Physical Exam: BP 130/60   Pulse 64   Ht '5\' 9"'  (1.753 m)   Wt 150 lb (68 kg)   BMI 22.15 kg/m  Constitutional:  Frail elderly white male in wheelchair with chest / back brace on in NAD Psychiatric: Normal mood and affect. Behavior is normal. EENT:  Conjunctivae are normal. No scleral icterus. Neck supple.  Cardiovascular: Normal rate, regular rhythm.  Pulmonary/chest: Effort normal. Unable to auscultate with hard shell  chest / back brace present. Abdominal: Brace present, unable to do abdominal exam Extremities: no edema Lymphadenopathy: No cervical adenopathy noted. Neurological: Alert and oriented to person place and time. Skin: Skin is warm and dry. No rashes noted.   ASSESSMENT AND PLAN:  1. 83 year old male with recurrent dysphagia / regurgitation. He has been empirically dilated a couple of times. Last EGD with dilation done 2013. No strictures found, just 5-6 cm hiatal hernia. Has suspected esophageal motility disorder.  -Doubt stricture but will obtain barium swallow for further evaluation. Will call him with results.  -continue BID PPI  2. Chronic back pain. Back surgery in 2015  Post-op course complicated by wound infection / osteomyelitis. Unable to walk, appears to have severe kyphosis. He is wearing a hard shell brace.  Tye Savoy, NP  07/23/2016, 3:24 PM

## 2016-07-23 NOTE — Patient Instructions (Signed)
If you are age 81 or older, your body mass index should be between 23-30. Your Body mass index is 22.15 kg/m. If this is out of the aforementioned range listed, please consider follow up with your Primary Care Provider.  If you are age 83 or younger, your body mass index should be between 19-25. Your Body mass index is 22.15 kg/m. If this is out of the aformentioned range listed, please consider follow up with your Primary Care Provider.   You have been scheduled for a Barium Esophogram at [facility] (1st floor of the hospital) on Monday February 12th at 10:30am. Please arrive 15 minutes prior to your appointment for registration. Make certain not to have anything to eat or drink 6 hours prior to your test. If you need to reschedule for any reason, please contact radiology at [phone] to do so. __________________________________________________________________ A barium swallow is an examination that concentrates on views of the esophagus. This tends to be a double contrast exam (barium and two liquids which, when combined, create a gas to distend the wall of the oesophagus) or single contrast (non-ionic iodine based). The study is usually tailored to your symptoms so a good history is essential. Attention is paid during the study to the form, structure and configuration of the esophagus, looking for functional disorders (such as aspiration, dysphagia, achalasia, motility and reflux) EXAMINATION You may be asked to change into a gown, depending on the type of swallow being performed. A radiologist and radiographer will perform the procedure. The radiologist will advise you of the type of contrast selected for your procedure and direct you during the exam. You will be asked to stand, sit or lie in several different positions and to hold a small amount of fluid in your mouth before being asked to swallow while the imaging is performed .In some instances you may be asked to swallow barium  coated marshmallows to assess the motility of a solid food bolus. The exam can be recorded as a digital or video fluoroscopy procedure. POST PROCEDURE It will take 1-2 days for the barium to pass through your system. To facilitate this, it is important, unless otherwise directed, to increase your fluids for the next 24-48hrs and to resume your normal diet.  This test typically takes about 30 minutes to perform. __________________________________________________________________________________  Please continue taking your Nexium once daily and your Protonix once daily.  Thank you

## 2016-07-28 ENCOUNTER — Ambulatory Visit (HOSPITAL_COMMUNITY)
Admission: RE | Admit: 2016-07-28 | Discharge: 2016-07-28 | Disposition: A | Discharge status: Home / Self Care | Payer: PPO | Source: Ambulatory Visit | Attending: Nurse Practitioner | Admitting: Nurse Practitioner

## 2016-07-28 DIAGNOSIS — K219 Gastro-esophageal reflux disease without esophagitis: Secondary | ICD-10-CM

## 2016-07-28 DIAGNOSIS — R1310 Dysphagia, unspecified: Secondary | ICD-10-CM | POA: Diagnosis not present

## 2016-07-28 DIAGNOSIS — K224 Dyskinesia of esophagus: Secondary | ICD-10-CM | POA: Insufficient documentation

## 2016-07-28 DIAGNOSIS — K449 Diaphragmatic hernia without obstruction or gangrene: Secondary | ICD-10-CM | POA: Diagnosis not present

## 2016-07-28 NOTE — Progress Notes (Signed)
Agree with assessment and plans 

## 2016-07-31 ENCOUNTER — Telehealth: Payer: Self-pay

## 2016-07-31 NOTE — Telephone Encounter (Signed)
Patients son spoke with Sharyn Lull and thought imaging was to be done prior to appointment with Dr Johnnye Sima.  After reviewing notes I do not see any imaging orders from Dr Johnnye Sima.  Advised to come for scheduled visit and discuss in further detail regarding concerns of patient.   Laverle Patter, RN

## 2016-08-04 ENCOUNTER — Other Ambulatory Visit: Payer: Self-pay | Admitting: Infectious Diseases

## 2016-08-04 ENCOUNTER — Encounter: Payer: Self-pay | Admitting: Infectious Diseases

## 2016-08-04 ENCOUNTER — Telehealth: Payer: Self-pay | Admitting: Internal Medicine

## 2016-08-04 ENCOUNTER — Ambulatory Visit (HOSPITAL_COMMUNITY)
Admission: RE | Admit: 2016-08-04 | Discharge: 2016-08-04 | Disposition: A | Discharge status: Home / Self Care | Payer: PPO | Source: Ambulatory Visit | Attending: Infectious Diseases | Admitting: Infectious Diseases

## 2016-08-04 ENCOUNTER — Ambulatory Visit (INDEPENDENT_AMBULATORY_CARE_PROVIDER_SITE_OTHER): Payer: PPO | Admitting: Infectious Diseases

## 2016-08-04 DIAGNOSIS — K219 Gastro-esophageal reflux disease without esophagitis: Secondary | ICD-10-CM

## 2016-08-04 DIAGNOSIS — G061 Intraspinal abscess and granuloma: Secondary | ICD-10-CM | POA: Diagnosis not present

## 2016-08-04 DIAGNOSIS — I82432 Acute embolism and thrombosis of left popliteal vein: Secondary | ICD-10-CM | POA: Insufficient documentation

## 2016-08-04 DIAGNOSIS — R600 Localized edema: Secondary | ICD-10-CM | POA: Insufficient documentation

## 2016-08-04 DIAGNOSIS — I82412 Acute embolism and thrombosis of left femoral vein: Secondary | ICD-10-CM | POA: Diagnosis not present

## 2016-08-04 DIAGNOSIS — I739 Peripheral vascular disease, unspecified: Secondary | ICD-10-CM

## 2016-08-04 DIAGNOSIS — Z7189 Other specified counseling: Secondary | ICD-10-CM | POA: Insufficient documentation

## 2016-08-04 DIAGNOSIS — Z515 Encounter for palliative care: Secondary | ICD-10-CM | POA: Diagnosis not present

## 2016-08-04 DIAGNOSIS — M7989 Other specified soft tissue disorders: Secondary | ICD-10-CM

## 2016-08-04 NOTE — Assessment & Plan Note (Signed)
I am concerned with his worsening stature, worsening dysphagia that we may at some point approach fulite care.  I spoke with him and his son regarding being evaluated by Palliative care/hospice for planning if he had reached that point.  He did not want to meet with a palliative care MD at this point.  He does of HCPOA (son) and living will.

## 2016-08-04 NOTE — Assessment & Plan Note (Signed)
Will continue him on current anbx We spoke that Dr Carloyn Manner does not feel he is a good candidate for surgery.  Will have him eval by IR for kyphoplasty?

## 2016-08-04 NOTE — Addendum Note (Signed)
Addended by: Reggy Eye on: 08/04/2016 03:32 PM   Modules accepted: Orders

## 2016-08-04 NOTE — Assessment & Plan Note (Signed)
He will f/u with his PCP Will check LLE doppler due to swelling.

## 2016-08-04 NOTE — Telephone Encounter (Signed)
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 I relayed this information to his care taker, his son, [name] with the plan that they go see Dr Willey Blade in the morning for management. I will also reach out to dr fagan's office for an update.

## 2016-08-04 NOTE — Progress Notes (Signed)
Subjective:    Patient ID: Derrick Sanchez, male    DOB: 04-Sep-1930, 83 y.o.   MRN: [id number]  HPI 83 yo M with hx of L4-5 decompression with prosthesis on 02-06-14. He did well post-op and was d/c on 02-08-14.  His course was further complicated by a fall and L greater trochanter fracture 02-27-14. He was d/c home on 9-17 with medical mgmt.  He returned on 03-12-14 with worsening back and MRI that was highly suggestive of post-operative infection, osteomyelitis, and epidural abscess. He was afebrile and had WBC that was normal. He had ESR 56. CRP 5. 2.  He underwent debridement/re-exploration 9-27. He was started on vancomycin and ceftriaxone post-operatively, ceftriaxone then changed to cefepime. His operative Cx were (-). He was d/c home 9-30 with vanco/ceftriaxone/rifampin. He completed only 4 weeks of this as he had issues with increased Cr.  He had f/u CT scan of spine on 05-17-14:  1. Sequelae of discitis osteomyelitis at L4-L5 with endplate erosions and loosening of pedicle screws at both levels, the right L4 screw has breeched the superior right L4 endplate. Nondisplaced right pedicle fracture suspected and may be subacute. No arthrodesis. 2. Nonetheless, underlying L4-L5 anterolisthesis has not significantly changed. The thecal sac is not well delineated at this level. 3. Multifactorial spinal stenosis at L3-L4 appears stable since September. He has had more back pain and has since f/u with [name].  He had MRI 10-11-14 at [place] office: L4-5 chronic/progressive osteo/disctis. Now with abscess surrounding intervertebral cage new since 2015.  He was sent to IR and had aspirate which grew MRSE(s- lvq). He was started on vancomycin via PIC (started 5-4).   He had removal of his hardware on 11-23-14. His Cx at that time were (-).  He had f/u MRI 11-29-14 at [name] office.   He had ID f/u on 6-16 and was planned to continue on vanco to the end of July (6 weeks from  hardware out).  Had neurosurgery f/u 7-20 and had x-rays, which he felt were unchanged.  His ESR on 7-21 was 50.  Had ID f/u on 7-22 and had ceftriaxone added to his vancomycin.  He had f/u MRI on 7-29 that showed paraspinal soft tissue infectious findings slowly improving. With resolution of some of dorsal abscess. Persistent myositis with soft tissue phlegmon. Persistent discitis/osteomyelitis L3-4, L4-5.   He had ID f/u on 9-21, he underwent repeat MRI on 9-29 showing: Some decreased paraspinal and erector spinae muscle inflammation since July, but continued confluent marrow inflammation from the L3 to the superior S1 level following the 3 level discitis osteomyelitis and subtotal destruction of the L4 vertebral body. Fluid signal persists in the L3-L4 and L4-L5 disc spaces. No epidural or paraspinal abscess. Mass effect and distortion upon the thecal sac at L4-L5 is stable to mildly increased. He completed vanco 03-2015 and was then transitioned to bactrim.   He was obtaining a second opinion from Neurosurgery he was off his atbx for around 2 months. He had core bx (negative per notes) and was felt to have complete obliteration of L4 and L5, suspicion for L3-4 infection as well. He had disc aspiration 10-11-2015 that was negative.   He returned to [facility] on 10-19-15 after developing extreme weakness. He was found to hvae BCx (2/2) P multocida (pan-sens). He also had UCx >100k K pneumo (R-amp). Had new kitten at home.  He was d/c to [facility] nsg center on 10-26-15 on Cipro. He d/c home AMA on  11-16-15.  He has no home nurse, just his wife.  Home labs (Cr 1.07) 11-21-15.  He had f/u wth Dr Carloyn Manner on 6-8. He has gotten home hospital bed and rehab.   [id number]  08-09-15 BCA16 5836 CRP   <.5  He was seen in ID in f/u on 11-26-15 with plan to continue his anbx  (cipro) for 2 more months.  He had f/u with ID on 02-01-16 and was changed from cipro to levaquin. His inflammatory markers were rechecked.  He has had f/u with his neurosurgeon (04-25-2016). He has loss of vertebrae height. He is not felt to be a surgical candidate.   He was seen by GI 2-7 for solid food dysphagia. States this has been going on for a month.  He states he has been feeling well- can get into bed with help, easier to get out of bed. Is unable to walk or stand.  His back hurts only in the AM, for 3-4 hours.  No longer getting physical therapy, trying to do on his own.  He continues on levaquin.   He has been seen by vascular surgery for LE edema, duskiness. Has come off his lasix.   Review of Systems  Constitutional: Negative for chills and fever.  HENT: Positive for trouble swallowing.   Cardiovascular: Positive for leg swelling.  Gastrointestinal: Positive for constipation. Negative for diarrhea.  Genitourinary: Negative for difficulty urinating.  Musculoskeletal: Positive for back pain.       Objective:   Physical Exam  Constitutional: He appears well-developed and well-nourished.  HENT:  Mouth/Throat: No oropharyngeal exudate.  Eyes: EOM are normal.  Cardiovascular: Normal rate, regular rhythm and normal heart sounds.   Pulmonary/Chest: Effort normal and breath sounds normal.  Abdominal: Soft. Bowel sounds are normal. There is no tenderness. There is no rebound.  Musculoskeletal:       Arms:      Legs:     Assessment & Plan:

## 2016-08-04 NOTE — Assessment & Plan Note (Signed)
He will f/u with GI.

## 2016-08-04 NOTE — Addendum Note (Signed)
Addended by: Reggy Eye on: 08/04/2016 03:45 PM   Modules accepted: Orders

## 2016-08-05 ENCOUNTER — Telehealth: Payer: Self-pay | Admitting: Internal Medicine

## 2016-08-05 ENCOUNTER — Ambulatory Visit (HOSPITAL_COMMUNITY): Admission: RE | Admit: 2016-08-05 | Payer: PPO | Source: Ambulatory Visit

## 2016-08-05 DIAGNOSIS — I82402 Acute embolism and thrombosis of unspecified deep veins of left lower extremity: Secondary | ICD-10-CM | POA: Diagnosis not present

## 2016-08-05 NOTE — Telephone Encounter (Signed)
Spoke with dr Willey Blade this morning to relay ultrasound results and need for anticoagulation that could be established as an outpatient. He agreed that prompt treatment could be started in the outpatient setting and can have patient be seen today

## 2016-08-14 DIAGNOSIS — Z85828 Personal history of other malignant neoplasm of skin: Secondary | ICD-10-CM | POA: Diagnosis not present

## 2016-08-14 DIAGNOSIS — L821 Other seborrheic keratosis: Secondary | ICD-10-CM | POA: Diagnosis not present

## 2016-08-14 DIAGNOSIS — L820 Inflamed seborrheic keratosis: Secondary | ICD-10-CM | POA: Diagnosis not present

## 2016-08-14 DIAGNOSIS — L812 Freckles: Secondary | ICD-10-CM | POA: Diagnosis not present

## 2016-08-19 ENCOUNTER — Ambulatory Visit: Payer: PPO | Admitting: Internal Medicine

## 2016-08-19 DIAGNOSIS — S46011D Strain of muscle(s) and tendon(s) of the rotator cuff of right shoulder, subsequent encounter: Secondary | ICD-10-CM | POA: Diagnosis not present

## 2016-08-27 ENCOUNTER — Telehealth: Payer: Self-pay | Admitting: *Deleted

## 2016-08-27 NOTE — Telephone Encounter (Signed)
Called IR as the patient son called to check status of his procedure advised once IR returns the call will give him a call back.

## 2016-08-28 DIAGNOSIS — I82402 Acute embolism and thrombosis of unspecified deep veins of left lower extremity: Secondary | ICD-10-CM | POA: Diagnosis not present

## 2016-08-28 DIAGNOSIS — G061 Intraspinal abscess and granuloma: Secondary | ICD-10-CM | POA: Diagnosis not present

## 2016-09-01 DIAGNOSIS — M85832 Other specified disorders of bone density and structure, left forearm: Secondary | ICD-10-CM | POA: Diagnosis not present

## 2016-09-01 DIAGNOSIS — M810 Age-related osteoporosis without current pathological fracture: Secondary | ICD-10-CM | POA: Diagnosis not present

## 2016-09-03 NOTE — Telephone Encounter (Signed)
Patient's son calling, asking for an update on the IR procedure. [name], RN

## 2016-09-04 ENCOUNTER — Other Ambulatory Visit: Payer: Self-pay | Admitting: Infectious Diseases

## 2016-09-04 ENCOUNTER — Telehealth: Payer: Self-pay | Admitting: *Deleted

## 2016-09-04 DIAGNOSIS — M5136 Other intervertebral disc degeneration, lumbar region: Secondary | ICD-10-CM

## 2016-09-04 NOTE — Telephone Encounter (Signed)
thanks

## 2016-09-04 NOTE — Telephone Encounter (Addendum)
Jocelyn Lamer from [facility] called to advise that the patient needs an MRI prior to having the Kyphoplasty evaluation. Advised me to call her directly once he has this done and fax an order to her. Advised her will let Dr Johnnye Sima know and once the scan is done will send her what she needs.   Jocelyn Lamer Ph [phone] Fax [phone]

## 2016-09-09 ENCOUNTER — Telehealth: Payer: Self-pay | Admitting: Infectious Diseases

## 2016-09-09 ENCOUNTER — Telehealth: Payer: Self-pay | Admitting: *Deleted

## 2016-09-09 DIAGNOSIS — M4646 Discitis, unspecified, lumbar region: Secondary | ICD-10-CM | POA: Diagnosis not present

## 2016-09-09 DIAGNOSIS — M8668 Other chronic osteomyelitis, other site: Secondary | ICD-10-CM | POA: Diagnosis not present

## 2016-09-09 DIAGNOSIS — M810 Age-related osteoporosis without current pathological fracture: Secondary | ICD-10-CM | POA: Diagnosis not present

## 2016-09-09 NOTE — Telephone Encounter (Signed)
Dr. Glenna Fellows called requesting that Dr. Johnnye Sima call him at his convenience. He is seeing Mr. Cuevas today. [phone].

## 2016-09-09 NOTE — Telephone Encounter (Signed)
Attempted to call Dr Rex Kras office, closed for the evening.  Will try again tomorrow.

## 2016-09-11 DIAGNOSIS — I83028 Varicose veins of left lower extremity with ulcer other part of lower leg: Secondary | ICD-10-CM | POA: Diagnosis not present

## 2016-09-11 DIAGNOSIS — L97822 Non-pressure chronic ulcer of other part of left lower leg with fat layer exposed: Secondary | ICD-10-CM | POA: Diagnosis not present

## 2016-09-11 DIAGNOSIS — I872 Venous insufficiency (chronic) (peripheral): Secondary | ICD-10-CM | POA: Diagnosis not present

## 2016-09-15 ENCOUNTER — Inpatient Hospital Stay: Admission: RE | Admit: 2016-09-15 | Payer: PPO | Source: Ambulatory Visit

## 2016-09-16 DIAGNOSIS — I83028 Varicose veins of left lower extremity with ulcer other part of lower leg: Secondary | ICD-10-CM | POA: Diagnosis not present

## 2016-09-16 DIAGNOSIS — L97822 Non-pressure chronic ulcer of other part of left lower leg with fat layer exposed: Secondary | ICD-10-CM | POA: Diagnosis not present

## 2016-09-16 DIAGNOSIS — I872 Venous insufficiency (chronic) (peripheral): Secondary | ICD-10-CM | POA: Diagnosis not present

## 2016-09-18 ENCOUNTER — Telehealth: Payer: Self-pay | Admitting: Infectious Diseases

## 2016-09-18 NOTE — Telephone Encounter (Signed)
Spoke with Dr Carloyn Manner regarding his imaging He will check CT for better bony definition.  He is considering placing screws to stabilizing his spine My great appreciation to Dr Carloyn Manner

## 2016-09-24 DIAGNOSIS — M810 Age-related osteoporosis without current pathological fracture: Secondary | ICD-10-CM | POA: Diagnosis not present

## 2016-09-24 DIAGNOSIS — M8668 Other chronic osteomyelitis, other site: Secondary | ICD-10-CM | POA: Diagnosis not present

## 2016-09-24 DIAGNOSIS — G8220 Paraplegia, unspecified: Secondary | ICD-10-CM | POA: Diagnosis not present

## 2016-09-24 DIAGNOSIS — M4646 Discitis, unspecified, lumbar region: Secondary | ICD-10-CM | POA: Diagnosis not present

## 2016-09-25 ENCOUNTER — Telehealth: Payer: Self-pay | Admitting: *Deleted

## 2016-09-25 DIAGNOSIS — I83028 Varicose veins of left lower extremity with ulcer other part of lower leg: Secondary | ICD-10-CM | POA: Diagnosis not present

## 2016-09-25 DIAGNOSIS — L97822 Non-pressure chronic ulcer of other part of left lower leg with fat layer exposed: Secondary | ICD-10-CM | POA: Diagnosis not present

## 2016-09-25 NOTE — Telephone Encounter (Signed)
Hillary RN from Dr Roy's office called and advised that Dr Carloyn Manner wants to schedule the patient for a CT but can not get it approved because we have an MRI in. He would like for [place] to cancel our scan. Advised them will ask the doctor and give them a call back.

## 2016-09-28 DIAGNOSIS — Z79891 Long term (current) use of opiate analgesic: Secondary | ICD-10-CM | POA: Diagnosis not present

## 2016-09-28 DIAGNOSIS — Z4800 Encounter for change or removal of nonsurgical wound dressing: Secondary | ICD-10-CM | POA: Diagnosis not present

## 2016-09-28 DIAGNOSIS — I872 Venous insufficiency (chronic) (peripheral): Secondary | ICD-10-CM | POA: Diagnosis not present

## 2016-09-28 DIAGNOSIS — L97929 Non-pressure chronic ulcer of unspecified part of left lower leg with unspecified severity: Secondary | ICD-10-CM | POA: Diagnosis not present

## 2016-09-28 DIAGNOSIS — J45909 Unspecified asthma, uncomplicated: Secondary | ICD-10-CM | POA: Diagnosis not present

## 2016-09-28 DIAGNOSIS — Z7901 Long term (current) use of anticoagulants: Secondary | ICD-10-CM | POA: Diagnosis not present

## 2016-09-29 ENCOUNTER — Telehealth: Payer: Self-pay

## 2016-09-29 ENCOUNTER — Other Ambulatory Visit (HOSPITAL_COMMUNITY): Payer: Self-pay | Admitting: Neurosurgery

## 2016-09-29 ENCOUNTER — Other Ambulatory Visit: Payer: PPO

## 2016-09-29 DIAGNOSIS — M4646 Discitis, unspecified, lumbar region: Secondary | ICD-10-CM

## 2016-09-29 DIAGNOSIS — J45909 Unspecified asthma, uncomplicated: Secondary | ICD-10-CM | POA: Diagnosis not present

## 2016-09-29 DIAGNOSIS — L97929 Non-pressure chronic ulcer of unspecified part of left lower leg with unspecified severity: Secondary | ICD-10-CM | POA: Diagnosis not present

## 2016-09-29 DIAGNOSIS — I872 Venous insufficiency (chronic) (peripheral): Secondary | ICD-10-CM | POA: Diagnosis not present

## 2016-09-29 NOTE — Telephone Encounter (Signed)
[facility] and advised we stopped the MRI referral so they can schedule the CT.

## 2016-09-30 NOTE — Telephone Encounter (Signed)
Erroneous encounter

## 2016-10-01 DIAGNOSIS — Z7901 Long term (current) use of anticoagulants: Secondary | ICD-10-CM | POA: Diagnosis not present

## 2016-10-01 DIAGNOSIS — L97929 Non-pressure chronic ulcer of unspecified part of left lower leg with unspecified severity: Secondary | ICD-10-CM | POA: Diagnosis not present

## 2016-10-01 DIAGNOSIS — J45909 Unspecified asthma, uncomplicated: Secondary | ICD-10-CM | POA: Diagnosis not present

## 2016-10-01 DIAGNOSIS — Z79891 Long term (current) use of opiate analgesic: Secondary | ICD-10-CM | POA: Diagnosis not present

## 2016-10-01 DIAGNOSIS — I872 Venous insufficiency (chronic) (peripheral): Secondary | ICD-10-CM | POA: Diagnosis not present

## 2016-10-01 DIAGNOSIS — Z4800 Encounter for change or removal of nonsurgical wound dressing: Secondary | ICD-10-CM | POA: Diagnosis not present

## 2016-10-02 DIAGNOSIS — Z79899 Other long term (current) drug therapy: Secondary | ICD-10-CM | POA: Diagnosis not present

## 2016-10-02 DIAGNOSIS — K219 Gastro-esophageal reflux disease without esophagitis: Secondary | ICD-10-CM | POA: Diagnosis not present

## 2016-10-02 DIAGNOSIS — G2581 Restless legs syndrome: Secondary | ICD-10-CM | POA: Diagnosis not present

## 2016-10-02 DIAGNOSIS — I872 Venous insufficiency (chronic) (peripheral): Secondary | ICD-10-CM | POA: Diagnosis not present

## 2016-10-02 DIAGNOSIS — E213 Hyperparathyroidism, unspecified: Secondary | ICD-10-CM | POA: Diagnosis not present

## 2016-10-02 DIAGNOSIS — M1990 Unspecified osteoarthritis, unspecified site: Secondary | ICD-10-CM | POA: Diagnosis not present

## 2016-10-02 DIAGNOSIS — D649 Anemia, unspecified: Secondary | ICD-10-CM | POA: Diagnosis not present

## 2016-10-07 ENCOUNTER — Ambulatory Visit (INDEPENDENT_AMBULATORY_CARE_PROVIDER_SITE_OTHER): Payer: PPO | Admitting: Internal Medicine

## 2016-10-07 ENCOUNTER — Encounter: Payer: Self-pay | Admitting: Internal Medicine

## 2016-10-07 ENCOUNTER — Telehealth: Payer: Self-pay | Admitting: Internal Medicine

## 2016-10-07 VITALS — BP 76/42 | HR 64 | Ht 69.0 in

## 2016-10-07 DIAGNOSIS — K224 Dyskinesia of esophagus: Secondary | ICD-10-CM

## 2016-10-07 NOTE — Progress Notes (Signed)
HISTORY OF PRESENT ILLNESS:  Derrick Sanchez is a 83 y.o. male who presents today for follow-up regarding issues with dysphagia, trouble eating, and difficulties with throat clearing. Previous patient of Dr. Verl Blalock. Prior colonoscopy and upper endoscopy in 2013. I saw the patient on 1 occasion October 2017 for difficulties clearing phlegm in his throat. Not all clear this was a primary GI problem, though GERD entertained. Placed on PPI. Patient states his problem persists without change. He also complains of difficulty swallowing. Has several bites then gets fullness and regurgitation. Known to have esophageal dysmotility disorder. Seen by GI physician assistant in February for these complaints. Barium esophagram arrange to see if there was anything that could be addressed endoscopically such stricture. Examination was completed 07/28/2016. He was found to have evidence of dysmotility and hiatal hernia. Stasis. Easy passage of barium meal. No stricture. No obstruction. He is accompanied today by his assistant  REVIEW OF SYSTEMS:  All non-GI ROS negative upon review  Past Medical History:  Diagnosis Date  . Allergy   . Arthritis   . Blood transfusion   . Candidiasis of the esophagus 07-17-2000   EGD  . Cataract    bil eyes  . Colon polyps 05-27-2002   Tubulovillous Adenoma-Colonoscopy  . Diverticulosis of colon (without mention of hemorrhage) 2008,2006,2003   Colonoscopy  . Esophageal reflux 2008   EGD  . Glaucoma   . Hiatal hernia 2002,2003,2013   EGD  . Hyperparathyroidism, unspecified ([place])   . Hypertension   . Internal hemorrhoids without mention of complication 6803   Colonoscopy  . Multiple falls    Pt falls asleep without warning and has encounter multiple falls  . Obstructive sleep apnea (adult) (pediatric)   . Other vitamin B12 deficiency anemia   . Pneumonia    years ago  . Restless legs syndrome (RLS)   . Stricture and stenosis of esophagus 05-27-2002   EGD  .  Unspecified asthma(493.90)     Past Surgical History:  Procedure Laterality Date  . arm surgery    . CATARACT EXTRACTION Bilateral   . COLONOSCOPY    . EYE SURGERY Bilateral    cataracts iol  . HIP SURGERY Right 1998   replacement  . LUMBAR FUSION  x 3   most recent June 2016  . LUMBAR LAMINECTOMY/ DECOMPRESSION WITH MET-RX N/A 11/23/2014   Procedure: HARDWARE REMOVAL WITH MET-RX;  Surgeon: Jovita Gamma, MD;  Location: [place] [facility];  Service: Neurosurgery;  Laterality: N/A;  removal of lumbar posterior instrumentation  . LUMBAR WOUND DEBRIDEMENT N/A 03/12/2014   Procedure: LUMBAR WOUND DEBRIDEMENT;  Surgeon: Elaina Hoops, MD;  Location: [place] [facility];  Service: Neurosurgery;  Laterality: N/A;  LUMBAR WOUND DEBRIDEMENT  . MASS EXCISION  10/14/2011   Procedure: EXCISION MASS;  Surgeon: Wynonia Sours, MD;  Location: [facility];  Service: Orthopedics;  Laterality: Left;   left hand  . PARATHYROIDECTOMY  2008  . PICC LINE PLACE PERIPHERAL ([place] HX) Left   . TOTAL KNEE ARTHROPLASTY Left 1998   replacement    Social History QUAVON KEISLING  reports that he has never smoked. He has never used smokeless tobacco. He reports that he does not drink alcohol or use drugs.  family history includes Heart disease in his father and sister; Leukemia in his brother.  No Known Allergies     PHYSICAL EXAMINATION: Vital signs: BP (!) 76/42   Pulse 64   Ht '5\' 9"'  (1.753 m)  Constitutional: Elderly, thin, markedly kyphotic in wheelchair, no acute distress Psychiatric: Pleasant, alert and oriented x3, cooperative Eyes: extraocular movements intact, anicteric, conjunctiva pink Mouth: oral pharynx moist, no lesions. No thrush Neck: supple without thyromegaly Lymph: no lymphadenopathy Cardiovascular: heart regular rate and rhythm, no murmur Lungs: clear to auscultation bilaterally Abdomen: soft, nontender, nondistended, no obvious ascites, no peritoneal signs, normal bowel sounds, no  organomegaly. Ommitted Rectal: Omitted Extremities: no clubbing or cyanosis. Trace lower extremity edema bilaterally. Discoloration right lower extremity Skin: no lesions on visible extremities Neuro: No focal deficits. Cranial nerves intact  ASSESSMENT:  #1. Difficulty with swallowing secondary to combination of esophageal dysmotility and marked kyphosis. No medical or endoscopic remedies available #2. Thick phlegm and throat. Ongoing problem   PLAN:  #1. Reviewed with patient and his workup and the nature of his problem #2. Recommended smaller bites of meals and eat very slowly #3. Recommended increased water consumption as this may thin his thick phlegm #4. Return to the care of PCP  15 minutes spent face-to-face with the patient. Near the entire time use for counseling regarding his swallowing issues and reviewing the above recommendations as well as answering questions

## 2016-10-07 NOTE — Patient Instructions (Signed)
Please follow up as needed 

## 2016-10-08 NOTE — Telephone Encounter (Signed)
acknowledged

## 2016-10-09 DIAGNOSIS — Z79891 Long term (current) use of opiate analgesic: Secondary | ICD-10-CM | POA: Diagnosis not present

## 2016-10-09 DIAGNOSIS — J45909 Unspecified asthma, uncomplicated: Secondary | ICD-10-CM | POA: Diagnosis not present

## 2016-10-09 DIAGNOSIS — I872 Venous insufficiency (chronic) (peripheral): Secondary | ICD-10-CM | POA: Diagnosis not present

## 2016-10-09 DIAGNOSIS — Z7901 Long term (current) use of anticoagulants: Secondary | ICD-10-CM | POA: Diagnosis not present

## 2016-10-09 DIAGNOSIS — Z4800 Encounter for change or removal of nonsurgical wound dressing: Secondary | ICD-10-CM | POA: Diagnosis not present

## 2016-10-09 DIAGNOSIS — L97929 Non-pressure chronic ulcer of unspecified part of left lower leg with unspecified severity: Secondary | ICD-10-CM | POA: Diagnosis not present

## 2016-10-09 DIAGNOSIS — I82402 Acute embolism and thrombosis of unspecified deep veins of left lower extremity: Secondary | ICD-10-CM | POA: Diagnosis not present

## 2016-10-10 ENCOUNTER — Ambulatory Visit (HOSPITAL_COMMUNITY)
Admission: RE | Admit: 2016-10-10 | Discharge: 2016-10-10 | Disposition: A | Discharge status: Home / Self Care | Payer: PPO | Source: Ambulatory Visit | Attending: Neurosurgery | Admitting: Neurosurgery

## 2016-10-10 DIAGNOSIS — M4646 Discitis, unspecified, lumbar region: Secondary | ICD-10-CM

## 2016-10-10 DIAGNOSIS — M48061 Spinal stenosis, lumbar region without neurogenic claudication: Secondary | ICD-10-CM | POA: Diagnosis not present

## 2016-10-10 LAB — POCT I-STAT CREATININE: Creatinine, Ser: 1.3 mg/dL — ABNORMAL HIGH (ref 0.61–1.24)

## 2016-10-10 MED ORDER — IOPAMIDOL (ISOVUE-300) INJECTION 61%: 100.0000 mL | Freq: Once | INTRAVENOUS | Status: DC | PRN

## 2016-10-16 DIAGNOSIS — Z79891 Long term (current) use of opiate analgesic: Secondary | ICD-10-CM | POA: Diagnosis not present

## 2016-10-16 DIAGNOSIS — L97929 Non-pressure chronic ulcer of unspecified part of left lower leg with unspecified severity: Secondary | ICD-10-CM | POA: Diagnosis not present

## 2016-10-16 DIAGNOSIS — Z4800 Encounter for change or removal of nonsurgical wound dressing: Secondary | ICD-10-CM | POA: Diagnosis not present

## 2016-10-16 DIAGNOSIS — J45909 Unspecified asthma, uncomplicated: Secondary | ICD-10-CM | POA: Diagnosis not present

## 2016-10-16 DIAGNOSIS — I872 Venous insufficiency (chronic) (peripheral): Secondary | ICD-10-CM | POA: Diagnosis not present

## 2016-10-16 DIAGNOSIS — Z7901 Long term (current) use of anticoagulants: Secondary | ICD-10-CM | POA: Diagnosis not present

## 2016-10-28 DIAGNOSIS — L97829 Non-pressure chronic ulcer of other part of left lower leg with unspecified severity: Secondary | ICD-10-CM | POA: Diagnosis not present

## 2016-10-28 DIAGNOSIS — I872 Venous insufficiency (chronic) (peripheral): Secondary | ICD-10-CM | POA: Diagnosis not present

## 2016-10-28 DIAGNOSIS — I83028 Varicose veins of left lower extremity with ulcer other part of lower leg: Secondary | ICD-10-CM | POA: Diagnosis not present

## 2016-10-28 DIAGNOSIS — I83012 Varicose veins of right lower extremity with ulcer of calf: Secondary | ICD-10-CM | POA: Diagnosis not present

## 2016-10-28 DIAGNOSIS — L97211 Non-pressure chronic ulcer of right calf limited to breakdown of skin: Secondary | ICD-10-CM | POA: Diagnosis not present

## 2016-10-28 DIAGNOSIS — I83018 Varicose veins of right lower extremity with ulcer other part of lower leg: Secondary | ICD-10-CM | POA: Diagnosis not present

## 2016-10-28 DIAGNOSIS — L97819 Non-pressure chronic ulcer of other part of right lower leg with unspecified severity: Secondary | ICD-10-CM | POA: Diagnosis not present

## 2016-10-28 DIAGNOSIS — L97221 Non-pressure chronic ulcer of left calf limited to breakdown of skin: Secondary | ICD-10-CM | POA: Diagnosis not present

## 2016-10-29 ENCOUNTER — Other Ambulatory Visit: Payer: Self-pay

## 2016-10-29 DIAGNOSIS — M47816 Spondylosis without myelopathy or radiculopathy, lumbar region: Secondary | ICD-10-CM | POA: Diagnosis not present

## 2016-10-29 DIAGNOSIS — M4646 Discitis, unspecified, lumbar region: Secondary | ICD-10-CM | POA: Diagnosis not present

## 2016-10-29 DIAGNOSIS — M8668 Other chronic osteomyelitis, other site: Secondary | ICD-10-CM | POA: Diagnosis not present

## 2016-10-29 DIAGNOSIS — G8220 Paraplegia, unspecified: Secondary | ICD-10-CM | POA: Diagnosis not present

## 2016-10-29 NOTE — Patient Outreach (Signed)
[facility]) Care Management  [place]  10/29/2016   Derrick KUHNER [birth year] [id number]  Referral Date: 10-28-16 Referral Source:  EMMI Referral Reason:EMMI Prevent Outreach Attempt: First Outreach successful  Social: Patient lives with wife.  Son is available and helps with picking up medications etc. Patient reports that he is able to bath and dress himself and do some cooking.  He states he has some that comes to do laundry etc. twice a week.    Conditions: Patient reports a long history of back problems and has had 3 surgeries.  He reports he has issues with pain but takes oxycodone.  Patient denies any health issues such as heart failure, diabetes, COPD, or heart irregularities. Patient reports that Gordon comes weekly to dress his legs as he has a sore on his leg.  He denies any problems with that.    Medications:  Patient able to manage his medications and able to afford medications.    Appointments: Son takes him to appointments.    Advanced Directives: Patient has Living Will and his son is his HCPOA.  RN Health Coach unable to identify any needs at this time.  Advised patient on contact information for future reference.    Plan: RN Health Coach will send letter and brochure to patient for future reference. RN Health Coach will notify care management assistant of case status.  Jone Baseman, RN, MSN [place] [phone]

## 2016-10-30 DIAGNOSIS — Z4800 Encounter for change or removal of nonsurgical wound dressing: Secondary | ICD-10-CM | POA: Diagnosis not present

## 2016-10-30 DIAGNOSIS — L97929 Non-pressure chronic ulcer of unspecified part of left lower leg with unspecified severity: Secondary | ICD-10-CM | POA: Diagnosis not present

## 2016-10-30 DIAGNOSIS — I872 Venous insufficiency (chronic) (peripheral): Secondary | ICD-10-CM | POA: Diagnosis not present

## 2016-10-30 DIAGNOSIS — J45909 Unspecified asthma, uncomplicated: Secondary | ICD-10-CM | POA: Diagnosis not present

## 2016-10-30 DIAGNOSIS — Z79891 Long term (current) use of opiate analgesic: Secondary | ICD-10-CM | POA: Diagnosis not present

## 2016-10-30 DIAGNOSIS — Z7901 Long term (current) use of anticoagulants: Secondary | ICD-10-CM | POA: Diagnosis not present

## 2016-11-12 DIAGNOSIS — M4646 Discitis, unspecified, lumbar region: Secondary | ICD-10-CM | POA: Diagnosis not present

## 2016-11-12 DIAGNOSIS — G8220 Paraplegia, unspecified: Secondary | ICD-10-CM | POA: Diagnosis not present

## 2016-11-12 DIAGNOSIS — M8668 Other chronic osteomyelitis, other site: Secondary | ICD-10-CM | POA: Diagnosis not present

## 2016-11-14 DIAGNOSIS — J45909 Unspecified asthma, uncomplicated: Secondary | ICD-10-CM | POA: Diagnosis not present

## 2016-11-14 DIAGNOSIS — Z4800 Encounter for change or removal of nonsurgical wound dressing: Secondary | ICD-10-CM | POA: Diagnosis not present

## 2016-11-14 DIAGNOSIS — L97929 Non-pressure chronic ulcer of unspecified part of left lower leg with unspecified severity: Secondary | ICD-10-CM | POA: Diagnosis not present

## 2016-11-14 DIAGNOSIS — Z7901 Long term (current) use of anticoagulants: Secondary | ICD-10-CM | POA: Diagnosis not present

## 2016-11-14 DIAGNOSIS — I872 Venous insufficiency (chronic) (peripheral): Secondary | ICD-10-CM | POA: Diagnosis not present

## 2016-11-14 DIAGNOSIS — Z79891 Long term (current) use of opiate analgesic: Secondary | ICD-10-CM | POA: Diagnosis not present

## 2016-11-18 DIAGNOSIS — Z4800 Encounter for change or removal of nonsurgical wound dressing: Secondary | ICD-10-CM | POA: Diagnosis not present

## 2016-11-18 DIAGNOSIS — Z79891 Long term (current) use of opiate analgesic: Secondary | ICD-10-CM | POA: Diagnosis not present

## 2016-11-18 DIAGNOSIS — I872 Venous insufficiency (chronic) (peripheral): Secondary | ICD-10-CM | POA: Diagnosis not present

## 2016-11-18 DIAGNOSIS — L97929 Non-pressure chronic ulcer of unspecified part of left lower leg with unspecified severity: Secondary | ICD-10-CM | POA: Diagnosis not present

## 2016-11-18 DIAGNOSIS — Z7901 Long term (current) use of anticoagulants: Secondary | ICD-10-CM | POA: Diagnosis not present

## 2016-11-18 DIAGNOSIS — J45909 Unspecified asthma, uncomplicated: Secondary | ICD-10-CM | POA: Diagnosis not present

## 2016-11-21 DIAGNOSIS — M542 Cervicalgia: Secondary | ICD-10-CM | POA: Diagnosis not present

## 2016-11-25 DIAGNOSIS — Z7901 Long term (current) use of anticoagulants: Secondary | ICD-10-CM | POA: Diagnosis not present

## 2016-11-25 DIAGNOSIS — I83012 Varicose veins of right lower extremity with ulcer of calf: Secondary | ICD-10-CM | POA: Diagnosis not present

## 2016-11-25 DIAGNOSIS — L97211 Non-pressure chronic ulcer of right calf limited to breakdown of skin: Secondary | ICD-10-CM | POA: Diagnosis not present

## 2016-11-25 DIAGNOSIS — L97929 Non-pressure chronic ulcer of unspecified part of left lower leg with unspecified severity: Secondary | ICD-10-CM | POA: Diagnosis not present

## 2016-11-25 DIAGNOSIS — I872 Venous insufficiency (chronic) (peripheral): Secondary | ICD-10-CM | POA: Diagnosis not present

## 2016-11-25 DIAGNOSIS — I8390 Asymptomatic varicose veins of unspecified lower extremity: Secondary | ICD-10-CM | POA: Diagnosis not present

## 2016-11-25 DIAGNOSIS — L97221 Non-pressure chronic ulcer of left calf limited to breakdown of skin: Secondary | ICD-10-CM | POA: Diagnosis not present

## 2016-11-25 DIAGNOSIS — J45909 Unspecified asthma, uncomplicated: Secondary | ICD-10-CM | POA: Diagnosis not present

## 2016-11-25 DIAGNOSIS — Z4800 Encounter for change or removal of nonsurgical wound dressing: Secondary | ICD-10-CM | POA: Diagnosis not present

## 2016-11-25 DIAGNOSIS — Z79891 Long term (current) use of opiate analgesic: Secondary | ICD-10-CM | POA: Diagnosis not present

## 2016-11-27 DIAGNOSIS — Z4800 Encounter for change or removal of nonsurgical wound dressing: Secondary | ICD-10-CM | POA: Diagnosis not present

## 2016-11-27 DIAGNOSIS — I872 Venous insufficiency (chronic) (peripheral): Secondary | ICD-10-CM | POA: Diagnosis not present

## 2016-11-27 DIAGNOSIS — L97929 Non-pressure chronic ulcer of unspecified part of left lower leg with unspecified severity: Secondary | ICD-10-CM | POA: Diagnosis not present

## 2016-11-27 DIAGNOSIS — J45909 Unspecified asthma, uncomplicated: Secondary | ICD-10-CM | POA: Diagnosis not present

## 2016-11-29 DIAGNOSIS — M4646 Discitis, unspecified, lumbar region: Secondary | ICD-10-CM | POA: Diagnosis not present

## 2016-11-29 DIAGNOSIS — G8220 Paraplegia, unspecified: Secondary | ICD-10-CM | POA: Diagnosis not present

## 2016-11-29 DIAGNOSIS — M8668 Other chronic osteomyelitis, other site: Secondary | ICD-10-CM | POA: Diagnosis not present

## 2016-11-29 DIAGNOSIS — M47816 Spondylosis without myelopathy or radiculopathy, lumbar region: Secondary | ICD-10-CM | POA: Diagnosis not present

## 2016-12-02 DIAGNOSIS — M4646 Discitis, unspecified, lumbar region: Secondary | ICD-10-CM | POA: Diagnosis not present

## 2016-12-09 DIAGNOSIS — J45909 Unspecified asthma, uncomplicated: Secondary | ICD-10-CM | POA: Diagnosis not present

## 2016-12-09 DIAGNOSIS — I872 Venous insufficiency (chronic) (peripheral): Secondary | ICD-10-CM | POA: Diagnosis not present

## 2016-12-09 DIAGNOSIS — Z4800 Encounter for change or removal of nonsurgical wound dressing: Secondary | ICD-10-CM | POA: Diagnosis not present

## 2016-12-09 DIAGNOSIS — Z79891 Long term (current) use of opiate analgesic: Secondary | ICD-10-CM | POA: Diagnosis not present

## 2016-12-09 DIAGNOSIS — L97929 Non-pressure chronic ulcer of unspecified part of left lower leg with unspecified severity: Secondary | ICD-10-CM | POA: Diagnosis not present

## 2016-12-09 DIAGNOSIS — Z7901 Long term (current) use of anticoagulants: Secondary | ICD-10-CM | POA: Diagnosis not present

## 2016-12-24 ENCOUNTER — Other Ambulatory Visit: Payer: Self-pay | Admitting: Infectious Diseases

## 2016-12-24 DIAGNOSIS — G061 Intraspinal abscess and granuloma: Secondary | ICD-10-CM

## 2016-12-29 DIAGNOSIS — M47816 Spondylosis without myelopathy or radiculopathy, lumbar region: Secondary | ICD-10-CM | POA: Diagnosis not present

## 2016-12-29 DIAGNOSIS — M4646 Discitis, unspecified, lumbar region: Secondary | ICD-10-CM | POA: Diagnosis not present

## 2016-12-29 DIAGNOSIS — G8220 Paraplegia, unspecified: Secondary | ICD-10-CM | POA: Diagnosis not present

## 2016-12-29 DIAGNOSIS — M8668 Other chronic osteomyelitis, other site: Secondary | ICD-10-CM | POA: Diagnosis not present

## 2017-01-06 DIAGNOSIS — G8220 Paraplegia, unspecified: Secondary | ICD-10-CM | POA: Diagnosis not present

## 2017-01-06 DIAGNOSIS — M8668 Other chronic osteomyelitis, other site: Secondary | ICD-10-CM | POA: Diagnosis not present

## 2017-01-06 DIAGNOSIS — G894 Chronic pain syndrome: Secondary | ICD-10-CM | POA: Diagnosis not present

## 2017-01-09 DIAGNOSIS — E538 Deficiency of other specified B group vitamins: Secondary | ICD-10-CM | POA: Diagnosis not present

## 2017-01-09 DIAGNOSIS — K219 Gastro-esophageal reflux disease without esophagitis: Secondary | ICD-10-CM | POA: Diagnosis not present

## 2017-01-09 DIAGNOSIS — G2581 Restless legs syndrome: Secondary | ICD-10-CM | POA: Diagnosis not present

## 2017-01-09 DIAGNOSIS — Z79899 Other long term (current) drug therapy: Secondary | ICD-10-CM | POA: Diagnosis not present

## 2017-01-09 DIAGNOSIS — J45909 Unspecified asthma, uncomplicated: Secondary | ICD-10-CM | POA: Diagnosis not present

## 2017-01-09 DIAGNOSIS — M1990 Unspecified osteoarthritis, unspecified site: Secondary | ICD-10-CM | POA: Diagnosis not present

## 2017-01-09 DIAGNOSIS — R609 Edema, unspecified: Secondary | ICD-10-CM | POA: Diagnosis not present

## 2017-01-09 DIAGNOSIS — E213 Hyperparathyroidism, unspecified: Secondary | ICD-10-CM | POA: Diagnosis not present

## 2017-01-10 DIAGNOSIS — G8220 Paraplegia, unspecified: Secondary | ICD-10-CM | POA: Diagnosis not present

## 2017-01-10 DIAGNOSIS — M8668 Other chronic osteomyelitis, other site: Secondary | ICD-10-CM | POA: Diagnosis not present

## 2017-01-15 DIAGNOSIS — E440 Moderate protein-calorie malnutrition: Secondary | ICD-10-CM | POA: Diagnosis not present

## 2017-01-15 DIAGNOSIS — K219 Gastro-esophageal reflux disease without esophagitis: Secondary | ICD-10-CM | POA: Diagnosis not present

## 2017-01-15 DIAGNOSIS — Z0001 Encounter for general adult medical examination with abnormal findings: Secondary | ICD-10-CM | POA: Diagnosis not present

## 2017-01-28 DIAGNOSIS — M4646 Discitis, unspecified, lumbar region: Secondary | ICD-10-CM | POA: Diagnosis not present

## 2017-01-29 DIAGNOSIS — G8220 Paraplegia, unspecified: Secondary | ICD-10-CM | POA: Diagnosis not present

## 2017-01-29 DIAGNOSIS — M4646 Discitis, unspecified, lumbar region: Secondary | ICD-10-CM | POA: Diagnosis not present

## 2017-01-29 DIAGNOSIS — M47816 Spondylosis without myelopathy or radiculopathy, lumbar region: Secondary | ICD-10-CM | POA: Diagnosis not present

## 2017-01-29 DIAGNOSIS — M8668 Other chronic osteomyelitis, other site: Secondary | ICD-10-CM | POA: Diagnosis not present

## 2017-02-04 DIAGNOSIS — M419 Scoliosis, unspecified: Secondary | ICD-10-CM | POA: Diagnosis not present

## 2017-02-04 DIAGNOSIS — S80812A Abrasion, left lower leg, initial encounter: Secondary | ICD-10-CM | POA: Diagnosis not present

## 2017-02-04 DIAGNOSIS — M1990 Unspecified osteoarthritis, unspecified site: Secondary | ICD-10-CM | POA: Diagnosis not present

## 2017-02-04 DIAGNOSIS — S81812A Laceration without foreign body, left lower leg, initial encounter: Secondary | ICD-10-CM | POA: Diagnosis not present

## 2017-02-18 DIAGNOSIS — T24222A Burn of second degree of left knee, initial encounter: Secondary | ICD-10-CM | POA: Diagnosis not present

## 2017-02-18 DIAGNOSIS — L97821 Non-pressure chronic ulcer of other part of left lower leg limited to breakdown of skin: Secondary | ICD-10-CM | POA: Diagnosis not present

## 2017-02-18 DIAGNOSIS — I872 Venous insufficiency (chronic) (peripheral): Secondary | ICD-10-CM | POA: Diagnosis not present

## 2017-02-18 DIAGNOSIS — I83028 Varicose veins of left lower extremity with ulcer other part of lower leg: Secondary | ICD-10-CM | POA: Diagnosis not present

## 2017-02-18 DIAGNOSIS — L97828 Non-pressure chronic ulcer of other part of left lower leg with other specified severity: Secondary | ICD-10-CM | POA: Diagnosis not present

## 2017-02-26 DIAGNOSIS — I83022 Varicose veins of left lower extremity with ulcer of calf: Secondary | ICD-10-CM | POA: Diagnosis not present

## 2017-02-26 DIAGNOSIS — L97222 Non-pressure chronic ulcer of left calf with fat layer exposed: Secondary | ICD-10-CM | POA: Diagnosis not present

## 2017-03-01 DIAGNOSIS — M4646 Discitis, unspecified, lumbar region: Secondary | ICD-10-CM | POA: Diagnosis not present

## 2017-03-01 DIAGNOSIS — G8220 Paraplegia, unspecified: Secondary | ICD-10-CM | POA: Diagnosis not present

## 2017-03-01 DIAGNOSIS — M8668 Other chronic osteomyelitis, other site: Secondary | ICD-10-CM | POA: Diagnosis not present

## 2017-03-01 DIAGNOSIS — M47816 Spondylosis without myelopathy or radiculopathy, lumbar region: Secondary | ICD-10-CM | POA: Diagnosis not present

## 2017-03-03 DIAGNOSIS — K0262 Dental caries on smooth surface penetrating into dentin: Secondary | ICD-10-CM | POA: Diagnosis not present

## 2017-03-05 DIAGNOSIS — I83022 Varicose veins of left lower extremity with ulcer of calf: Secondary | ICD-10-CM | POA: Diagnosis not present

## 2017-03-05 DIAGNOSIS — L97222 Non-pressure chronic ulcer of left calf with fat layer exposed: Secondary | ICD-10-CM | POA: Diagnosis not present

## 2017-03-06 DIAGNOSIS — J309 Allergic rhinitis, unspecified: Secondary | ICD-10-CM | POA: Diagnosis not present

## 2017-03-06 DIAGNOSIS — J300 Vasomotor rhinitis: Secondary | ICD-10-CM | POA: Diagnosis not present

## 2017-03-06 DIAGNOSIS — R062 Wheezing: Secondary | ICD-10-CM | POA: Diagnosis not present

## 2017-03-09 DIAGNOSIS — I87302 Chronic venous hypertension (idiopathic) without complications of left lower extremity: Secondary | ICD-10-CM | POA: Diagnosis not present

## 2017-03-09 DIAGNOSIS — T24222D Burn of second degree of left knee, subsequent encounter: Secondary | ICD-10-CM | POA: Diagnosis not present

## 2017-03-09 DIAGNOSIS — L97321 Non-pressure chronic ulcer of left ankle limited to breakdown of skin: Secondary | ICD-10-CM | POA: Diagnosis not present

## 2017-03-26 ENCOUNTER — Ambulatory Visit: Payer: PPO | Admitting: Nurse Practitioner

## 2017-03-31 DIAGNOSIS — M4646 Discitis, unspecified, lumbar region: Secondary | ICD-10-CM | POA: Diagnosis not present

## 2017-03-31 DIAGNOSIS — M8668 Other chronic osteomyelitis, other site: Secondary | ICD-10-CM | POA: Diagnosis not present

## 2017-03-31 DIAGNOSIS — M47816 Spondylosis without myelopathy or radiculopathy, lumbar region: Secondary | ICD-10-CM | POA: Diagnosis not present

## 2017-03-31 DIAGNOSIS — G8220 Paraplegia, unspecified: Secondary | ICD-10-CM | POA: Diagnosis not present

## 2017-04-01 DIAGNOSIS — S61411A Laceration without foreign body of right hand, initial encounter: Secondary | ICD-10-CM | POA: Diagnosis not present

## 2017-04-01 DIAGNOSIS — I83029 Varicose veins of left lower extremity with ulcer of unspecified site: Secondary | ICD-10-CM | POA: Diagnosis not present

## 2017-04-01 DIAGNOSIS — L97929 Non-pressure chronic ulcer of unspecified part of left lower leg with unspecified severity: Secondary | ICD-10-CM | POA: Diagnosis not present

## 2017-04-01 DIAGNOSIS — L97222 Non-pressure chronic ulcer of left calf with fat layer exposed: Secondary | ICD-10-CM | POA: Diagnosis not present

## 2017-04-01 DIAGNOSIS — I83022 Varicose veins of left lower extremity with ulcer of calf: Secondary | ICD-10-CM | POA: Diagnosis not present

## 2017-04-07 ENCOUNTER — Ambulatory Visit (INDEPENDENT_AMBULATORY_CARE_PROVIDER_SITE_OTHER): Payer: PPO | Admitting: Nurse Practitioner

## 2017-04-07 ENCOUNTER — Encounter: Payer: Self-pay | Admitting: Nurse Practitioner

## 2017-04-07 VITALS — BP 112/74 | HR 72 | Ht 69.0 in | Wt 145.0 lb

## 2017-04-07 DIAGNOSIS — R1310 Dysphagia, unspecified: Secondary | ICD-10-CM

## 2017-04-07 NOTE — Patient Instructions (Addendum)
If you are age 81 or older, your body mass index should be between 23-30. Your Body mass index is 21.41 kg/m. If this is out of the aforementioned range listed, please consider follow up with your Primary Care Provider.  If you are age 64 or younger, your body mass index should be between 19-25. Your Body mass index is 21.41 kg/m. If this is out of the aformentioned range listed, please consider follow up with your Primary Care Provider.   Please start Ensure or Boost daily. Which ever is your preference.  Please grind all foods.  Follow up as needed with Dr. Henrene Pastor.  Thank you.

## 2017-04-07 NOTE — Progress Notes (Signed)
Chief Complaint:  dysphagia  HPI: Patient is an 83 year old male known to Dr. Henrene Pastor for history of GERD and esophageal dysmotility. We saw him in Feb for dysphagia. Esophogram revealed nonspecific esophageal dysmotility with weak propulsion wave and persistent stasis in the lower esophagus. He was seen again in April of this year for dysphasia which we felt was still secondary to dysmotility and marked kyphosis. Also had thick phlegm in his throat felt to be contributing to the swallowing problems. Mr.Commerford is back with dysphagia. He has to chew each mouthful of food several minutes before swallowing and some food still comes back into his mouth. He is able to get pills down with fluid. He dislikes Ensure. Weight is down 5 pounds since Feb. No pain with swallowing.   Past Medical History:  Diagnosis Date  . Allergy   . Arthritis   . Blood transfusion   . Candidiasis of the esophagus 07-17-2000   EGD  . Cataract    bil eyes  . Colon polyps 05-27-2002   Tubulovillous Adenoma-Colonoscopy  . Diverticulosis of colon (without mention of hemorrhage) 2008,2006,2003   Colonoscopy  . Esophageal reflux 2008   EGD  . Glaucoma   . Hiatal hernia 2002,2003,2013   EGD  . Hyperparathyroidism, unspecified ([place])   . Hypertension   . Internal hemorrhoids without mention of complication 9629   Colonoscopy  . Multiple falls    Pt falls asleep without warning and has encounter multiple falls  . Obstructive sleep apnea (adult) (pediatric)   . Other vitamin B12 deficiency anemia   . Pneumonia    years ago  . Restless legs syndrome (RLS)   . Stricture and stenosis of esophagus 05-27-2002   EGD  . Unspecified asthma(493.90)     Patient's surgical history, family medical history, social history, medications and allergies were all reviewed in Epic    Physical Exam: BP 112/74   Pulse 72   Ht 5\' 9"  (1.753 m)   Wt 145 lb (65.8 kg) Comment: Pt wheelchair bound. Thinks he weighs about 145.  BMI  21.41 kg/m   GENERAL:  White male in wheelchair.He is severely slumped over to the right PSYCH: :Pleasant, cooperative, normal affect PULM: Normal respiratory effort, lungs CTA bilaterally, no wheezing ABDOMEN:  Limited exam in wheelchair. Nondistended, soft, nontender. Normal bowel sounds NEURO: Alert and oriented x 3, no focal neurologic deficits    ASSESSMENT and PLAN:   Pleasant 83 year old year old male with chronic dysphagia secondary to esophageal dysmotility and severe kyphosis with severe slumping towards right side. Now having to chew each mouthful of food several minutes before swallowing.  -I saw him in Feb. No strictures on esophagram at that time. I still don't know that EGD with dilation would benefit him. For one thing he needs to correct position during meals. He understands and agrees with this. He was able to sit more upright in the chair. Secondly, he needs to downgrade diet to chopped or even pureed food. This would also help to eliminate some of the work of eating. Meat needs to be finely chopped, some foods can even be put in food processor.  -I will send this note to Dr Henrene Pastor and if he feels differently and thinks EGD needed then I will get back with Mr. Abello   .I spent 25 minutes of face-to-face time with the patient. Greater than 50% of the time was spent counseling and coordinating care. Questions answered   Tye Savoy , NP  04/07/2017, 11:21 AM

## 2017-04-08 DIAGNOSIS — L97321 Non-pressure chronic ulcer of left ankle limited to breakdown of skin: Secondary | ICD-10-CM | POA: Diagnosis not present

## 2017-04-09 DIAGNOSIS — M4626 Osteomyelitis of vertebra, lumbar region: Secondary | ICD-10-CM | POA: Diagnosis not present

## 2017-04-14 NOTE — Progress Notes (Signed)
Reviewed. I don't think he needs an EGD. However, you could consider evaluation with speech pathology if not done previously. Thanks

## 2017-04-16 ENCOUNTER — Telehealth: Payer: Self-pay

## 2017-04-16 ENCOUNTER — Other Ambulatory Visit: Payer: Self-pay

## 2017-04-16 DIAGNOSIS — K224 Dyskinesia of esophagus: Secondary | ICD-10-CM

## 2017-04-16 NOTE — Progress Notes (Signed)
Beth, can you let Derrick Sanchez know that Dr. Henrene Pastor agrees EGD probably won't be helpful. Can you make sure he is chopping his food really fine as we discussed. Also, please arrange for ST evaluation of swallowing. Maybe they can fine tune his diet to facilitate swallowing. Thanks

## 2017-04-16 NOTE — Telephone Encounter (Signed)
I have put a referral in for Speech Therapy evaluation. I have left a message for the patient to call back to discuss  Irene Shipper, MD at 04/07/2017 10:30 AM   Status: Signed    Reviewed. I don't think he needs an EGD. However, you could consider evaluation with speech pathology if not done previously. Thanks    Willia Craze, NP at 04/07/2017 10:30 AM   Status: Signed    Beth, can you let Mr. Pagliuca know that Dr. Henrene Pastor agrees EGD probably won't be helpful. Can you make sure he is chopping his food really fine as we discussed. Also, please arrange for ST evaluation of swallowing. Maybe they can fine tune his diet to facilitate swallowing. Thanks

## 2017-04-16 NOTE — Telephone Encounter (Signed)
Before the speech pathologist can evaluate and treat the patient, he must have a modified barium swallow. I have put in the order but have not scheduled the test. Is it okay to order this?

## 2017-04-16 NOTE — Telephone Encounter (Signed)
-----   Message from Willia Craze, NP sent at 04/16/2017  1:38 PM EDT -----   ----- Message ----- From: Irene Shipper, MD Sent: 04/14/2017   4:30 PM To: Willia Craze, NP    ----- Message ----- From: Willia Craze, NP Sent: 04/12/2017   1:27 PM To: Irene Shipper, MD

## 2017-04-21 ENCOUNTER — Other Ambulatory Visit: Payer: Self-pay

## 2017-04-21 DIAGNOSIS — R1310 Dysphagia, unspecified: Secondary | ICD-10-CM

## 2017-04-21 NOTE — Telephone Encounter (Signed)
He doesn't need a MBSS. Just looking for help with ways to help facilitate swallowing given his anatomy / dysmotility. Recommendations also on best foods / consistencies for him. Thanks

## 2017-04-21 NOTE — Telephone Encounter (Signed)
Okay. I will try again, but when the chart was reviewed, this is what I was told.

## 2017-04-22 ENCOUNTER — Other Ambulatory Visit (HOSPITAL_COMMUNITY): Payer: Self-pay | Admitting: Nurse Practitioner

## 2017-04-22 DIAGNOSIS — R1310 Dysphagia, unspecified: Secondary | ICD-10-CM

## 2017-04-30 DIAGNOSIS — L97821 Non-pressure chronic ulcer of other part of left lower leg limited to breakdown of skin: Secondary | ICD-10-CM | POA: Diagnosis not present

## 2017-04-30 DIAGNOSIS — I83018 Varicose veins of right lower extremity with ulcer other part of lower leg: Secondary | ICD-10-CM | POA: Diagnosis not present

## 2017-04-30 DIAGNOSIS — L97811 Non-pressure chronic ulcer of other part of right lower leg limited to breakdown of skin: Secondary | ICD-10-CM | POA: Diagnosis not present

## 2017-04-30 DIAGNOSIS — I872 Venous insufficiency (chronic) (peripheral): Secondary | ICD-10-CM | POA: Diagnosis not present

## 2017-04-30 DIAGNOSIS — R2231 Localized swelling, mass and lump, right upper limb: Secondary | ICD-10-CM | POA: Diagnosis not present

## 2017-05-01 ENCOUNTER — Ambulatory Visit (HOSPITAL_COMMUNITY)
Admission: RE | Admit: 2017-05-01 | Discharge: 2017-05-01 | Disposition: A | Discharge status: Home / Self Care | Payer: PPO | Source: Ambulatory Visit | Attending: Nurse Practitioner | Admitting: Nurse Practitioner

## 2017-05-01 DIAGNOSIS — R1310 Dysphagia, unspecified: Secondary | ICD-10-CM

## 2017-05-01 DIAGNOSIS — K224 Dyskinesia of esophagus: Secondary | ICD-10-CM | POA: Insufficient documentation

## 2017-05-01 DIAGNOSIS — M4646 Discitis, unspecified, lumbar region: Secondary | ICD-10-CM | POA: Diagnosis not present

## 2017-05-01 DIAGNOSIS — G8220 Paraplegia, unspecified: Secondary | ICD-10-CM | POA: Diagnosis not present

## 2017-05-01 DIAGNOSIS — T17308A Unspecified foreign body in larynx causing other injury, initial encounter: Secondary | ICD-10-CM | POA: Diagnosis not present

## 2017-05-01 DIAGNOSIS — M47816 Spondylosis without myelopathy or radiculopathy, lumbar region: Secondary | ICD-10-CM | POA: Diagnosis not present

## 2017-05-01 DIAGNOSIS — M8668 Other chronic osteomyelitis, other site: Secondary | ICD-10-CM | POA: Diagnosis not present

## 2017-05-01 DIAGNOSIS — T17320A Food in larynx causing asphyxiation, initial encounter: Secondary | ICD-10-CM | POA: Diagnosis not present

## 2017-05-01 NOTE — Progress Notes (Signed)
Modified Barium Swallow Progress Note  Patient Details  Name: Derrick Sanchez MRN: [id number] Date of Birth: Dec 03, 1930  Today's Date: 05/01/2017  Modified Barium Swallow completed.  Full report located under Chart Review in the Imaging Section.  Brief recommendations include the following:  Clinical Impression  Pt has a moderate-severe pharyngeal dysphagia that is felt to be secondary to his primary esophageal dysphagia. Despite adequate hyolaryngeal movement and retraction of the base of tongue toward the pharyngeal wall, he has little ability to propel any bolus through his UES. Resultant residue remains primarily in the valleculae with some in the pyriform sinuses, with spillage after the swallow into his airway.   Silent aspiration occurs with larger straw sips of thin liquid. Cup sips and bites of puree are penetrated above the vocal folds, and with sometimes multiple cued coughs and re-swallows he can sufficiently expel these penetrates from his laryngeal vestibule. Suspect that pt's difficulty with pharyngeal clearance is related to minimal negative pressure below his UES given his patulous esophagus. As a result, there may be minimal interventions to be provided, such as education about safety with swallowing versus acceptance of aspiration risk.   A Dys 1 diet and thin liquids by cup with frequent cough and re-swallow would likely reduce the risk of aspiration the most, but he still remains at risk for aspiration but also malnutrition/dehydration. He seems to be tolerating this aspiration from a respiratory standpoint so far, but any acute illness or deconditioning may make him more susceptible to infection. He may benefit from pharyngeal exercises to maximize/maintain his current abilities to try to compensate for esophageal issues.   Swallow Evaluation Recommendations       SLP Diet Recommendations: Dysphagia 1 (Puree) solids;Thin liquid(versus accepting risks and eating as  desired)   Liquid Administration via: Cup;No straw   Medication Administration: Other (Comment)(liquid form vs crushed in puree)   Supervision: Patient able to self feed   Compensations: Slow rate;Small sips/bites;Hard cough after swallow;Multiple dry swallows after each bite/sip   Postural Changes: Remain semi-upright after after feeds/meals (Comment);Seated upright at 90 degrees   Oral Care Recommendations: Oral care QID        Germain Osgood 05/01/2017,2:17 PM   Germain Osgood, M.A. CCC-SLP [phone]

## 2017-05-04 DIAGNOSIS — I87313 Chronic venous hypertension (idiopathic) with ulcer of bilateral lower extremity: Secondary | ICD-10-CM | POA: Diagnosis not present

## 2017-05-04 DIAGNOSIS — L97811 Non-pressure chronic ulcer of other part of right lower leg limited to breakdown of skin: Secondary | ICD-10-CM | POA: Diagnosis not present

## 2017-05-04 DIAGNOSIS — I83028 Varicose veins of left lower extremity with ulcer other part of lower leg: Secondary | ICD-10-CM | POA: Diagnosis not present

## 2017-05-04 DIAGNOSIS — L97821 Non-pressure chronic ulcer of other part of left lower leg limited to breakdown of skin: Secondary | ICD-10-CM | POA: Diagnosis not present

## 2017-05-04 DIAGNOSIS — L97822 Non-pressure chronic ulcer of other part of left lower leg with fat layer exposed: Secondary | ICD-10-CM | POA: Diagnosis not present

## 2017-05-05 ENCOUNTER — Other Ambulatory Visit (HOSPITAL_COMMUNITY): Payer: Self-pay | Admitting: Surgery

## 2017-05-05 ENCOUNTER — Ambulatory Visit (HOSPITAL_COMMUNITY)
Admission: RE | Admit: 2017-05-05 | Discharge: 2017-05-05 | Disposition: A | Discharge status: Home / Self Care | Payer: PPO | Source: Ambulatory Visit | Attending: Surgery | Admitting: Surgery

## 2017-05-05 DIAGNOSIS — M25521 Pain in right elbow: Secondary | ICD-10-CM | POA: Diagnosis not present

## 2017-05-05 DIAGNOSIS — S53122A Posterior subluxation of left ulnohumeral joint, initial encounter: Secondary | ICD-10-CM | POA: Insufficient documentation

## 2017-05-05 DIAGNOSIS — T148XXA Other injury of unspecified body region, initial encounter: Secondary | ICD-10-CM

## 2017-05-05 DIAGNOSIS — X58XXXA Exposure to other specified factors, initial encounter: Secondary | ICD-10-CM | POA: Diagnosis not present

## 2017-05-12 ENCOUNTER — Other Ambulatory Visit: Payer: Self-pay

## 2017-05-12 DIAGNOSIS — R1319 Other dysphagia: Secondary | ICD-10-CM

## 2017-05-12 DIAGNOSIS — T17920S Food in respiratory tract, part unspecified causing asphyxiation, sequela: Secondary | ICD-10-CM

## 2017-05-12 DIAGNOSIS — T17928S Food in respiratory tract, part unspecified causing other injury, sequela: Secondary | ICD-10-CM

## 2017-05-12 NOTE — Telephone Encounter (Signed)
Hi, any update on SLP seeing this man? If they want see him then I guess a dietician to suggest how he can get food to a consistency that he can tolerate. This poor main is so contorted in his wheelchair, I don't know how anything gets down. Thanks

## 2017-05-12 NOTE — Telephone Encounter (Signed)
Yes. I received a message today from [facility] on [address] today. They do not "do this type of therapy."  I have sent the referral to [facility]. I will let you know when I hear back.

## 2017-05-14 ENCOUNTER — Other Ambulatory Visit (HOSPITAL_COMMUNITY): Payer: Self-pay | Admitting: Internal Medicine

## 2017-05-14 ENCOUNTER — Ambulatory Visit (HOSPITAL_COMMUNITY)
Admission: RE | Admit: 2017-05-14 | Discharge: 2017-05-14 | Disposition: A | Discharge status: Home / Self Care | Payer: PPO | Source: Ambulatory Visit | Attending: Internal Medicine | Admitting: Internal Medicine

## 2017-05-14 DIAGNOSIS — M25521 Pain in right elbow: Secondary | ICD-10-CM

## 2017-05-14 DIAGNOSIS — M25421 Effusion, right elbow: Secondary | ICD-10-CM | POA: Diagnosis not present

## 2017-05-14 DIAGNOSIS — E538 Deficiency of other specified B group vitamins: Secondary | ICD-10-CM | POA: Diagnosis not present

## 2017-05-19 ENCOUNTER — Ambulatory Visit (HOSPITAL_COMMUNITY): Payer: PPO | Admitting: Speech Pathology

## 2017-05-20 DIAGNOSIS — G8220 Paraplegia, unspecified: Secondary | ICD-10-CM | POA: Diagnosis not present

## 2017-05-21 ENCOUNTER — Telehealth (HOSPITAL_COMMUNITY): Payer: Self-pay | Admitting: Speech Pathology

## 2017-05-21 ENCOUNTER — Ambulatory Visit (HOSPITAL_COMMUNITY): Payer: PPO | Attending: Nurse Practitioner | Admitting: Speech Pathology

## 2017-05-21 NOTE — Telephone Encounter (Signed)
Ms. Gwyneth Revels called while pt was here to let us know she just found out that he is here today for ST. However  they are seeing him for wound care. Pt needs to be d/c from one or the other, we both can not tx at the same time. Alexis Goodell will give instructions concerning future appointments.

## 2017-05-28 DIAGNOSIS — R2231 Localized swelling, mass and lump, right upper limb: Secondary | ICD-10-CM | POA: Diagnosis not present

## 2017-05-28 DIAGNOSIS — M25521 Pain in right elbow: Secondary | ICD-10-CM | POA: Diagnosis not present

## 2017-05-31 DIAGNOSIS — M4646 Discitis, unspecified, lumbar region: Secondary | ICD-10-CM | POA: Diagnosis not present

## 2017-05-31 DIAGNOSIS — M8668 Other chronic osteomyelitis, other site: Secondary | ICD-10-CM | POA: Diagnosis not present

## 2017-05-31 DIAGNOSIS — M47816 Spondylosis without myelopathy or radiculopathy, lumbar region: Secondary | ICD-10-CM | POA: Diagnosis not present

## 2017-05-31 DIAGNOSIS — G8220 Paraplegia, unspecified: Secondary | ICD-10-CM | POA: Diagnosis not present

## 2017-06-02 DIAGNOSIS — S43004A Unspecified dislocation of right shoulder joint, initial encounter: Secondary | ICD-10-CM | POA: Diagnosis not present

## 2017-06-04 DIAGNOSIS — G2581 Restless legs syndrome: Secondary | ICD-10-CM | POA: Diagnosis not present

## 2017-06-04 DIAGNOSIS — K224 Dyskinesia of esophagus: Secondary | ICD-10-CM | POA: Diagnosis not present

## 2017-06-04 DIAGNOSIS — R531 Weakness: Secondary | ICD-10-CM | POA: Diagnosis not present

## 2017-06-04 DIAGNOSIS — M25521 Pain in right elbow: Secondary | ICD-10-CM | POA: Diagnosis not present

## 2017-06-04 DIAGNOSIS — L97822 Non-pressure chronic ulcer of other part of left lower leg with fat layer exposed: Secondary | ICD-10-CM | POA: Diagnosis not present

## 2017-06-04 DIAGNOSIS — I83028 Varicose veins of left lower extremity with ulcer other part of lower leg: Secondary | ICD-10-CM | POA: Diagnosis not present

## 2017-06-04 DIAGNOSIS — E538 Deficiency of other specified B group vitamins: Secondary | ICD-10-CM | POA: Diagnosis not present

## 2017-06-04 DIAGNOSIS — Z7901 Long term (current) use of anticoagulants: Secondary | ICD-10-CM | POA: Diagnosis not present

## 2017-06-04 DIAGNOSIS — I82402 Acute embolism and thrombosis of unspecified deep veins of left lower extremity: Secondary | ICD-10-CM | POA: Diagnosis not present

## 2017-06-04 DIAGNOSIS — E209 Hypoparathyroidism, unspecified: Secondary | ICD-10-CM | POA: Diagnosis not present

## 2017-06-04 DIAGNOSIS — K219 Gastro-esophageal reflux disease without esophagitis: Secondary | ICD-10-CM | POA: Diagnosis not present

## 2017-06-04 DIAGNOSIS — H409 Unspecified glaucoma: Secondary | ICD-10-CM | POA: Diagnosis not present

## 2017-06-04 DIAGNOSIS — D649 Anemia, unspecified: Secondary | ICD-10-CM | POA: Diagnosis not present

## 2017-06-04 DIAGNOSIS — R1314 Dysphagia, pharyngoesophageal phase: Secondary | ICD-10-CM | POA: Diagnosis not present

## 2017-06-04 DIAGNOSIS — M40209 Unspecified kyphosis, site unspecified: Secondary | ICD-10-CM | POA: Diagnosis not present

## 2017-06-04 DIAGNOSIS — M1990 Unspecified osteoarthritis, unspecified site: Secondary | ICD-10-CM | POA: Diagnosis not present

## 2017-06-04 DIAGNOSIS — L97828 Non-pressure chronic ulcer of other part of left lower leg with other specified severity: Secondary | ICD-10-CM | POA: Diagnosis not present

## 2017-06-04 DIAGNOSIS — G4730 Sleep apnea, unspecified: Secondary | ICD-10-CM | POA: Diagnosis not present

## 2017-06-04 DIAGNOSIS — J45909 Unspecified asthma, uncomplicated: Secondary | ICD-10-CM | POA: Diagnosis not present

## 2017-06-17 DIAGNOSIS — K219 Gastro-esophageal reflux disease without esophagitis: Secondary | ICD-10-CM | POA: Diagnosis not present

## 2017-06-17 DIAGNOSIS — E538 Deficiency of other specified B group vitamins: Secondary | ICD-10-CM | POA: Diagnosis not present

## 2017-06-17 DIAGNOSIS — D649 Anemia, unspecified: Secondary | ICD-10-CM | POA: Diagnosis not present

## 2017-06-17 DIAGNOSIS — R531 Weakness: Secondary | ICD-10-CM | POA: Diagnosis not present

## 2017-06-17 DIAGNOSIS — I82402 Acute embolism and thrombosis of unspecified deep veins of left lower extremity: Secondary | ICD-10-CM | POA: Diagnosis not present

## 2017-06-17 DIAGNOSIS — R1314 Dysphagia, pharyngoesophageal phase: Secondary | ICD-10-CM | POA: Diagnosis not present

## 2017-06-17 DIAGNOSIS — M40209 Unspecified kyphosis, site unspecified: Secondary | ICD-10-CM | POA: Diagnosis not present

## 2017-06-17 DIAGNOSIS — E209 Hypoparathyroidism, unspecified: Secondary | ICD-10-CM | POA: Diagnosis not present

## 2017-06-17 DIAGNOSIS — H409 Unspecified glaucoma: Secondary | ICD-10-CM | POA: Diagnosis not present

## 2017-06-17 DIAGNOSIS — G4730 Sleep apnea, unspecified: Secondary | ICD-10-CM | POA: Diagnosis not present

## 2017-06-17 DIAGNOSIS — K224 Dyskinesia of esophagus: Secondary | ICD-10-CM | POA: Diagnosis not present

## 2017-06-17 DIAGNOSIS — G2581 Restless legs syndrome: Secondary | ICD-10-CM | POA: Diagnosis not present

## 2017-06-17 DIAGNOSIS — Z7901 Long term (current) use of anticoagulants: Secondary | ICD-10-CM | POA: Diagnosis not present

## 2017-06-17 DIAGNOSIS — M25521 Pain in right elbow: Secondary | ICD-10-CM | POA: Diagnosis not present

## 2017-06-17 DIAGNOSIS — J45909 Unspecified asthma, uncomplicated: Secondary | ICD-10-CM | POA: Diagnosis not present

## 2017-06-17 DIAGNOSIS — M1990 Unspecified osteoarthritis, unspecified site: Secondary | ICD-10-CM | POA: Diagnosis not present

## 2017-06-18 DIAGNOSIS — Z23 Encounter for immunization: Secondary | ICD-10-CM | POA: Diagnosis not present

## 2017-06-20 DIAGNOSIS — G8220 Paraplegia, unspecified: Secondary | ICD-10-CM | POA: Diagnosis not present

## 2017-06-20 DIAGNOSIS — R69 Illness, unspecified: Secondary | ICD-10-CM | POA: Diagnosis not present

## 2017-06-24 ENCOUNTER — Telehealth: Payer: Self-pay | Admitting: Internal Medicine

## 2017-06-25 ENCOUNTER — Other Ambulatory Visit (INDEPENDENT_AMBULATORY_CARE_PROVIDER_SITE_OTHER): Payer: Self-pay

## 2017-06-25 DIAGNOSIS — R1312 Dysphagia, oropharyngeal phase: Secondary | ICD-10-CM

## 2017-06-25 DIAGNOSIS — Z9189 Other specified personal risk factors, not elsewhere classified: Secondary | ICD-10-CM

## 2017-06-25 NOTE — Telephone Encounter (Signed)
Derrick Sanchez this is the same issue that started back in October. I have found the "fast track" referral form from [facility] and faxed it.  I will let you know if there are more issues.

## 2017-06-26 DIAGNOSIS — S43004D Unspecified dislocation of right shoulder joint, subsequent encounter: Secondary | ICD-10-CM | POA: Diagnosis not present

## 2017-07-03 DIAGNOSIS — R1310 Dysphagia, unspecified: Secondary | ICD-10-CM | POA: Diagnosis not present

## 2017-07-03 DIAGNOSIS — L97821 Non-pressure chronic ulcer of other part of left lower leg limited to breakdown of skin: Secondary | ICD-10-CM | POA: Diagnosis not present

## 2017-07-03 DIAGNOSIS — I87313 Chronic venous hypertension (idiopathic) with ulcer of bilateral lower extremity: Secondary | ICD-10-CM | POA: Diagnosis not present

## 2017-07-09 DIAGNOSIS — K224 Dyskinesia of esophagus: Secondary | ICD-10-CM | POA: Diagnosis not present

## 2017-07-09 DIAGNOSIS — I82402 Acute embolism and thrombosis of unspecified deep veins of left lower extremity: Secondary | ICD-10-CM | POA: Diagnosis not present

## 2017-07-17 DIAGNOSIS — E538 Deficiency of other specified B group vitamins: Secondary | ICD-10-CM | POA: Diagnosis not present

## 2017-07-21 DIAGNOSIS — G8220 Paraplegia, unspecified: Secondary | ICD-10-CM | POA: Diagnosis not present

## 2017-07-23 ENCOUNTER — Ambulatory Visit: Payer: PPO | Admitting: Physician Assistant

## 2017-08-04 DIAGNOSIS — I872 Venous insufficiency (chronic) (peripheral): Secondary | ICD-10-CM | POA: Diagnosis not present

## 2017-08-04 DIAGNOSIS — I83028 Varicose veins of left lower extremity with ulcer other part of lower leg: Secondary | ICD-10-CM | POA: Diagnosis not present

## 2017-08-04 DIAGNOSIS — L97822 Non-pressure chronic ulcer of other part of left lower leg with fat layer exposed: Secondary | ICD-10-CM | POA: Diagnosis not present

## 2017-08-07 DIAGNOSIS — M4626 Osteomyelitis of vertebra, lumbar region: Secondary | ICD-10-CM | POA: Diagnosis not present

## 2017-08-18 DIAGNOSIS — G8220 Paraplegia, unspecified: Secondary | ICD-10-CM | POA: Diagnosis not present

## 2017-09-10 DIAGNOSIS — M47812 Spondylosis without myelopathy or radiculopathy, cervical region: Secondary | ICD-10-CM | POA: Diagnosis not present

## 2017-09-10 DIAGNOSIS — G894 Chronic pain syndrome: Secondary | ICD-10-CM | POA: Diagnosis not present

## 2017-09-10 DIAGNOSIS — S43014A Anterior dislocation of right humerus, initial encounter: Secondary | ICD-10-CM | POA: Diagnosis not present

## 2017-09-10 DIAGNOSIS — M25511 Pain in right shoulder: Secondary | ICD-10-CM | POA: Diagnosis not present

## 2017-09-10 DIAGNOSIS — S199XXA Unspecified injury of neck, initial encounter: Secondary | ICD-10-CM | POA: Diagnosis not present

## 2017-09-10 DIAGNOSIS — M8668 Other chronic osteomyelitis, other site: Secondary | ICD-10-CM | POA: Diagnosis not present

## 2017-09-10 DIAGNOSIS — R937 Abnormal findings on diagnostic imaging of other parts of musculoskeletal system: Secondary | ICD-10-CM | POA: Diagnosis not present

## 2017-09-10 DIAGNOSIS — G8220 Paraplegia, unspecified: Secondary | ICD-10-CM | POA: Diagnosis not present

## 2017-09-10 DIAGNOSIS — M546 Pain in thoracic spine: Secondary | ICD-10-CM | POA: Diagnosis not present

## 2017-09-10 DIAGNOSIS — M545 Low back pain: Secondary | ICD-10-CM | POA: Diagnosis not present

## 2017-09-10 DIAGNOSIS — J9811 Atelectasis: Secondary | ICD-10-CM | POA: Diagnosis not present

## 2017-09-10 DIAGNOSIS — M542 Cervicalgia: Secondary | ICD-10-CM | POA: Diagnosis not present

## 2017-09-14 DIAGNOSIS — G4700 Insomnia, unspecified: Secondary | ICD-10-CM | POA: Diagnosis not present

## 2017-09-14 DIAGNOSIS — I82402 Acute embolism and thrombosis of unspecified deep veins of left lower extremity: Secondary | ICD-10-CM | POA: Diagnosis not present

## 2017-09-18 DIAGNOSIS — G8220 Paraplegia, unspecified: Secondary | ICD-10-CM | POA: Diagnosis not present

## 2017-09-20 ENCOUNTER — Other Ambulatory Visit: Payer: Self-pay

## 2017-09-20 ENCOUNTER — Emergency Department (HOSPITAL_COMMUNITY): Payer: PPO

## 2017-09-20 ENCOUNTER — Inpatient Hospital Stay (HOSPITAL_COMMUNITY)
Admission: EM | Admit: 2017-09-20 | Discharge: 2017-09-29 | DRG: 193 | Disposition: A | Discharge status: Home / Self Care | Payer: PPO | Attending: Family Medicine | Admitting: Family Medicine

## 2017-09-20 ENCOUNTER — Encounter (HOSPITAL_COMMUNITY): Payer: Self-pay | Admitting: Emergency Medicine

## 2017-09-20 DIAGNOSIS — R627 Adult failure to thrive: Secondary | ICD-10-CM | POA: Diagnosis not present

## 2017-09-20 DIAGNOSIS — M546 Pain in thoracic spine: Secondary | ICD-10-CM | POA: Diagnosis not present

## 2017-09-20 DIAGNOSIS — T148XXA Other injury of unspecified body region, initial encounter: Secondary | ICD-10-CM | POA: Diagnosis not present

## 2017-09-20 DIAGNOSIS — I739 Peripheral vascular disease, unspecified: Secondary | ICD-10-CM | POA: Diagnosis not present

## 2017-09-20 DIAGNOSIS — Z961 Presence of intraocular lens: Secondary | ICD-10-CM | POA: Diagnosis present

## 2017-09-20 DIAGNOSIS — D649 Anemia, unspecified: Secondary | ICD-10-CM | POA: Diagnosis present

## 2017-09-20 DIAGNOSIS — J189 Pneumonia, unspecified organism: Secondary | ICD-10-CM | POA: Diagnosis not present

## 2017-09-20 DIAGNOSIS — J9621 Acute and chronic respiratory failure with hypoxia: Secondary | ICD-10-CM | POA: Diagnosis not present

## 2017-09-20 DIAGNOSIS — I10 Essential (primary) hypertension: Secondary | ICD-10-CM | POA: Diagnosis not present

## 2017-09-20 DIAGNOSIS — I361 Nonrheumatic tricuspid (valve) insufficiency: Secondary | ICD-10-CM | POA: Diagnosis not present

## 2017-09-20 DIAGNOSIS — Z9841 Cataract extraction status, right eye: Secondary | ICD-10-CM

## 2017-09-20 DIAGNOSIS — Z9889 Other specified postprocedural states: Secondary | ICD-10-CM | POA: Diagnosis not present

## 2017-09-20 DIAGNOSIS — Z7401 Bed confinement status: Secondary | ICD-10-CM | POA: Diagnosis not present

## 2017-09-20 DIAGNOSIS — L8990 Pressure ulcer of unspecified site, unspecified stage: Secondary | ICD-10-CM

## 2017-09-20 DIAGNOSIS — G8929 Other chronic pain: Secondary | ICD-10-CM | POA: Diagnosis present

## 2017-09-20 DIAGNOSIS — Z981 Arthrodesis status: Secondary | ICD-10-CM

## 2017-09-20 DIAGNOSIS — L89213 Pressure ulcer of right hip, stage 3: Secondary | ICD-10-CM | POA: Diagnosis present

## 2017-09-20 DIAGNOSIS — Z681 Body mass index (BMI) 19 or less, adult: Secondary | ICD-10-CM | POA: Diagnosis not present

## 2017-09-20 DIAGNOSIS — J90 Pleural effusion, not elsewhere classified: Secondary | ICD-10-CM | POA: Diagnosis not present

## 2017-09-20 DIAGNOSIS — Z79899 Other long term (current) drug therapy: Secondary | ICD-10-CM

## 2017-09-20 DIAGNOSIS — Z9189 Other specified personal risk factors, not elsewhere classified: Secondary | ICD-10-CM

## 2017-09-20 DIAGNOSIS — E213 Hyperparathyroidism, unspecified: Secondary | ICD-10-CM | POA: Diagnosis present

## 2017-09-20 DIAGNOSIS — K219 Gastro-esophageal reflux disease without esophagitis: Secondary | ICD-10-CM | POA: Diagnosis not present

## 2017-09-20 DIAGNOSIS — Z86718 Personal history of other venous thrombosis and embolism: Secondary | ICD-10-CM

## 2017-09-20 DIAGNOSIS — G4733 Obstructive sleep apnea (adult) (pediatric): Secondary | ICD-10-CM | POA: Diagnosis present

## 2017-09-20 DIAGNOSIS — Z515 Encounter for palliative care: Secondary | ICD-10-CM

## 2017-09-20 DIAGNOSIS — M4620 Osteomyelitis of vertebra, site unspecified: Secondary | ICD-10-CM | POA: Diagnosis not present

## 2017-09-20 DIAGNOSIS — J181 Lobar pneumonia, unspecified organism: Principal | ICD-10-CM | POA: Diagnosis present

## 2017-09-20 DIAGNOSIS — Z993 Dependence on wheelchair: Secondary | ICD-10-CM | POA: Diagnosis not present

## 2017-09-20 DIAGNOSIS — Z66 Do not resuscitate: Secondary | ICD-10-CM | POA: Diagnosis not present

## 2017-09-20 DIAGNOSIS — M545 Low back pain, unspecified: Secondary | ICD-10-CM

## 2017-09-20 DIAGNOSIS — K5900 Constipation, unspecified: Secondary | ICD-10-CM | POA: Diagnosis not present

## 2017-09-20 DIAGNOSIS — S2241XD Multiple fractures of ribs, right side, subsequent encounter for fracture with routine healing: Secondary | ICD-10-CM | POA: Diagnosis not present

## 2017-09-20 DIAGNOSIS — E46 Unspecified protein-calorie malnutrition: Secondary | ICD-10-CM | POA: Diagnosis not present

## 2017-09-20 DIAGNOSIS — R0602 Shortness of breath: Secondary | ICD-10-CM

## 2017-09-20 DIAGNOSIS — Z9842 Cataract extraction status, left eye: Secondary | ICD-10-CM | POA: Diagnosis not present

## 2017-09-20 DIAGNOSIS — R0600 Dyspnea, unspecified: Secondary | ICD-10-CM

## 2017-09-20 DIAGNOSIS — L89223 Pressure ulcer of left hip, stage 3: Secondary | ICD-10-CM | POA: Diagnosis not present

## 2017-09-20 DIAGNOSIS — R079 Chest pain, unspecified: Secondary | ICD-10-CM | POA: Diagnosis not present

## 2017-09-20 DIAGNOSIS — R0902 Hypoxemia: Secondary | ICD-10-CM | POA: Diagnosis present

## 2017-09-20 DIAGNOSIS — R110 Nausea: Secondary | ICD-10-CM | POA: Diagnosis not present

## 2017-09-20 DIAGNOSIS — R1310 Dysphagia, unspecified: Secondary | ICD-10-CM | POA: Diagnosis not present

## 2017-09-20 DIAGNOSIS — Z7189 Other specified counseling: Secondary | ICD-10-CM

## 2017-09-20 DIAGNOSIS — D519 Vitamin B12 deficiency anemia, unspecified: Secondary | ICD-10-CM | POA: Diagnosis not present

## 2017-09-20 DIAGNOSIS — Z111 Encounter for screening for respiratory tuberculosis: Secondary | ICD-10-CM | POA: Diagnosis not present

## 2017-09-20 DIAGNOSIS — S2241XA Multiple fractures of ribs, right side, initial encounter for closed fracture: Secondary | ICD-10-CM | POA: Diagnosis present

## 2017-09-20 DIAGNOSIS — G061 Intraspinal abscess and granuloma: Secondary | ICD-10-CM

## 2017-09-20 DIAGNOSIS — H40059 Ocular hypertension, unspecified eye: Secondary | ICD-10-CM | POA: Diagnosis not present

## 2017-09-20 DIAGNOSIS — K449 Diaphragmatic hernia without obstruction or gangrene: Secondary | ICD-10-CM | POA: Diagnosis not present

## 2017-09-20 DIAGNOSIS — R52 Pain, unspecified: Secondary | ICD-10-CM | POA: Diagnosis not present

## 2017-09-20 DIAGNOSIS — Z9181 History of falling: Secondary | ICD-10-CM | POA: Diagnosis not present

## 2017-09-20 DIAGNOSIS — M4640 Discitis, unspecified, site unspecified: Secondary | ICD-10-CM | POA: Diagnosis present

## 2017-09-20 DIAGNOSIS — S299XXA Unspecified injury of thorax, initial encounter: Secondary | ICD-10-CM | POA: Diagnosis not present

## 2017-09-20 DIAGNOSIS — Z8249 Family history of ischemic heart disease and other diseases of the circulatory system: Secondary | ICD-10-CM

## 2017-09-20 DIAGNOSIS — Z96652 Presence of left artificial knee joint: Secondary | ICD-10-CM | POA: Diagnosis present

## 2017-09-20 DIAGNOSIS — R64 Cachexia: Secondary | ICD-10-CM | POA: Diagnosis present

## 2017-09-20 DIAGNOSIS — M25511 Pain in right shoulder: Secondary | ICD-10-CM | POA: Diagnosis not present

## 2017-09-20 LAB — CBC WITH DIFFERENTIAL/PLATELET
BASOS PCT: 0 %
Basophils Absolute: 0 10*3/uL (ref 0.0–0.1)
EOS ABS: 0 10*3/uL (ref 0.0–0.7)
EOS PCT: 0 %
HCT: 29.3 % — ABNORMAL LOW (ref 39.0–52.0)
Hemoglobin: 9 g/dL — ABNORMAL LOW (ref 13.0–17.0)
LYMPHS ABS: 1.3 10*3/uL (ref 0.7–4.0)
Lymphocytes Relative: 20 %
MCH: 27.4 pg (ref 26.0–34.0)
MCHC: 30.7 g/dL (ref 30.0–36.0)
MCV: 89.1 fL (ref 78.0–100.0)
Monocytes Absolute: 0.4 10*3/uL (ref 0.1–1.0)
Monocytes Relative: 7 %
NEUTROS PCT: 73 %
Neutro Abs: 4.5 10*3/uL (ref 1.7–7.7)
PLATELETS: 172 10*3/uL (ref 150–400)
RBC: 3.29 MIL/uL — AB (ref 4.22–5.81)
RDW: 16.8 % — ABNORMAL HIGH (ref 11.5–15.5)
WBC: 6.3 10*3/uL (ref 4.0–10.5)

## 2017-09-20 LAB — COMPREHENSIVE METABOLIC PANEL
ALT: 10 U/L — ABNORMAL LOW (ref 17–63)
AST: 19 U/L (ref 15–41)
Albumin: 1.7 g/dL — ABNORMAL LOW (ref 3.5–5.0)
Alkaline Phosphatase: 87 U/L (ref 38–126)
Anion gap: 7 (ref 5–15)
BUN: 36 mg/dL — ABNORMAL HIGH (ref 6–20)
CO2: 23 mmol/L (ref 22–32)
Calcium: 7.4 mg/dL — ABNORMAL LOW (ref 8.9–10.3)
Chloride: 110 mmol/L (ref 101–111)
Creatinine, Ser: 1.15 mg/dL (ref 0.61–1.24)
GFR, EST NON AFRICAN AMERICAN: 56 mL/min — AB (ref >60)
Glucose, Bld: 100 mg/dL — ABNORMAL HIGH (ref 65–99)
POTASSIUM: 4.6 mmol/L (ref 3.5–5.1)
SODIUM: 140 mmol/L (ref 135–145)
Total Bilirubin: 0.6 mg/dL (ref 0.3–1.2)
Total Protein: 6 g/dL — ABNORMAL LOW (ref 6.5–8.1)

## 2017-09-20 LAB — URINALYSIS, ROUTINE W REFLEX MICROSCOPIC
Bilirubin Urine: NEGATIVE
Glucose, UA: NEGATIVE mg/dL
Hgb urine dipstick: NEGATIVE
Ketones, ur: NEGATIVE mg/dL
LEUKOCYTES UA: NEGATIVE
NITRITE: NEGATIVE
Protein, ur: NEGATIVE mg/dL
Specific Gravity, Urine: 1.02 (ref 1.005–1.030)
pH: 5 (ref 5.0–8.0)

## 2017-09-20 LAB — TROPONIN I: Troponin I: <0.03 ng/mL (ref <0.03)

## 2017-09-20 LAB — LACTIC ACID, PLASMA: Lactic Acid, Venous: 1.8 mmol/L (ref 0.5–1.9)

## 2017-09-20 MED ORDER — SODIUM CHLORIDE 0.9 % IV SOLN
1.0000 g | Freq: Once | INTRAVENOUS | Status: AC
  Administered 2017-09-20: 1 g via INTRAVENOUS
  Filled 2017-09-20: qty 10

## 2017-09-20 MED ORDER — PRAMIPEXOLE DIHYDROCHLORIDE 1 MG PO TABS
2.0000 mg | ORAL_TABLET | Freq: Three times a day (TID) | ORAL | Status: DC
  Administered 2017-09-20 – 2017-09-29 (×26): 2 mg via ORAL
  Filled 2017-09-20 (×28): qty 2

## 2017-09-20 MED ORDER — B COMPLEX-C PO TABS
1.0000 | ORAL_TABLET | Freq: Every day | ORAL | Status: DC
  Administered 2017-09-21 – 2017-09-29 (×9): 1 via ORAL
  Filled 2017-09-20 (×9): qty 1

## 2017-09-20 MED ORDER — SODIUM CHLORIDE 0.9 % IV BOLUS
500.0000 mL | Freq: Once | INTRAVENOUS | Status: AC
  Administered 2017-09-20: 500 mL via INTRAVENOUS

## 2017-09-20 MED ORDER — DOCUSATE SODIUM 100 MG PO CAPS
100.0000 mg | ORAL_CAPSULE | Freq: Two times a day (BID) | ORAL | Status: DC
  Administered 2017-09-20 – 2017-09-29 (×16): 100 mg via ORAL
  Filled 2017-09-20 (×18): qty 1

## 2017-09-20 MED ORDER — ACETAMINOPHEN 325 MG PO TABS: 650.0000 mg | ORAL_TABLET | Freq: Four times a day (QID) | ORAL | Status: DC | PRN

## 2017-09-20 MED ORDER — LATANOPROST 0.005 % OP SOLN
1.0000 [drp] | Freq: Every day | OPHTHALMIC | Status: DC
  Administered 2017-09-20 – 2017-09-28 (×9): 1 [drp] via OPHTHALMIC
  Filled 2017-09-20: qty 2.5

## 2017-09-20 MED ORDER — ACETAMINOPHEN 650 MG RE SUPP: 650.0000 mg | Freq: Four times a day (QID) | RECTAL | Status: DC | PRN

## 2017-09-20 MED ORDER — SODIUM CHLORIDE 0.9 % IV SOLN
INTRAVENOUS | Status: DC
  Administered 2017-09-20 – 2017-09-23 (×3): via INTRAVENOUS

## 2017-09-20 MED ORDER — PIPERACILLIN-TAZOBACTAM 3.375 G IVPB
3.3750 g | Freq: Three times a day (TID) | INTRAVENOUS | Status: AC
  Administered 2017-09-21 – 2017-09-24 (×12): 3.375 g via INTRAVENOUS
  Filled 2017-09-20 (×12): qty 50

## 2017-09-20 MED ORDER — ONDANSETRON HCL 4 MG PO TABS: 4.0000 mg | ORAL_TABLET | Freq: Four times a day (QID) | ORAL | Status: DC | PRN

## 2017-09-20 MED ORDER — SODIUM CHLORIDE 0.9 % IV BOLUS
1000.0000 mL | Freq: Once | INTRAVENOUS | Status: AC
  Administered 2017-09-20: 1000 mL via INTRAVENOUS

## 2017-09-20 MED ORDER — OXYCODONE-ACETAMINOPHEN 5-325 MG PO TABS
1.0000 | ORAL_TABLET | Freq: Four times a day (QID) | ORAL | Status: DC | PRN
  Administered 2017-09-25 – 2017-09-26 (×2): 1 via ORAL
  Filled 2017-09-20 (×2): qty 1

## 2017-09-20 MED ORDER — ONDANSETRON HCL 4 MG/2ML IJ SOLN: 4.0000 mg | Freq: Four times a day (QID) | INTRAMUSCULAR | Status: DC | PRN

## 2017-09-20 MED ORDER — GABAPENTIN 300 MG PO CAPS
300.0000 mg | ORAL_CAPSULE | Freq: Every day | ORAL | Status: DC
  Administered 2017-09-21 – 2017-09-29 (×9): 300 mg via ORAL
  Filled 2017-09-20 (×10): qty 1

## 2017-09-20 MED ORDER — AZITHROMYCIN 500 MG IV SOLR
500.0000 mg | Freq: Once | INTRAVENOUS | Status: AC
  Administered 2017-09-20: 500 mg via INTRAVENOUS
  Filled 2017-09-20: qty 500

## 2017-09-20 MED ORDER — HEPARIN SODIUM (PORCINE) 5000 UNIT/ML IJ SOLN
5000.0000 [IU] | Freq: Three times a day (TID) | INTRAMUSCULAR | Status: DC
  Administered 2017-09-20 – 2017-09-21 (×2): 5000 [IU] via SUBCUTANEOUS
  Filled 2017-09-20 (×2): qty 1

## 2017-09-20 MED ORDER — GABAPENTIN 300 MG PO CAPS
600.0000 mg | ORAL_CAPSULE | Freq: Two times a day (BID) | ORAL | Status: DC
  Administered 2017-09-20 – 2017-09-29 (×18): 600 mg via ORAL
  Filled 2017-09-20 (×15): qty 2

## 2017-09-20 NOTE — ED Triage Notes (Signed)
Pt arrived from home where he lives with his son.  Pt is generally non-ambulatory and transfers from wheelchair to bed.  Has been c/o severe back pain for the past few days with no new injury or fall.  Pt found to have congested cough with O2 sat of 83%.  States the cough started about a month ago but he attributed it to his asthma.  Pt fell and dislocated his right shoulder with inability to reduce.

## 2017-09-20 NOTE — Progress Notes (Signed)
Patient arrived to room 1511 from [facility]. Patient is alert and oriented x4. Has O2 at 2L per [place]. Patient has multiple skin tears to arms, legs as well as multiple bruises to arms, legs, flank, back. Patient lower legs are discolored, dry and wounds are present (see wound documentation). Patient has MASD to abdomen and scrotum. Son accompanied patient and reported patient he had two additional medications that he didn't report and those were Trazadone 25mg  at HS and Xarelto daily. Reports patient uses motorized wheelchair and had recent fall. Patient skin wounds/skin tears cleaned with NS and applied foam to skin tears and tegaderm to left lower leg wound. Applied foam to wounds to left hip. Son reports patient lives with wife and he assists with care and patient has caregivers during the day. Reports up until fall two weeks ago patient was very independent in the home using his wheelchair. Patient oriented to room, call bell, bed use. Placed pillows to prevent skin pressure and bed alarm in place. Will ct/ monitor.

## 2017-09-20 NOTE — ED Notes (Signed)
Report to Sandra, RN

## 2017-09-20 NOTE — H&P (Signed)
History and Physical    Derrick Sanchez LHT:[id number] DOB: 04-10-1931 DOA: 09/20/2017  Referring MD/NP/PA: Evalee Jefferson, ED PA PCP: [name], MD  Patient coming from: Home  Chief Complaint: Right-sided chest pain, cough, congestion  HPI: Derrick Sanchez is a 84 y.o. male with history significant for dysphagia, discitis and osteomyelitis requiring long-term antibiotics now on daily suppression with Levaquin who 2 weeks ago suffered a fall while transferring himself from his motorized scooter into the bed.  He fell on his right side and had immediate right-sided chest pain.  Sometime thereafter he visited Dr. Christy Sartorius who is his neurosurgeon who imaged his back, son states there was no mention of rib fractures at that time.  He comes in today because that right-sided chest pain has continued despite oxycodone, he has now developed a cough productive of yellow sputum and congestion.  Denies fever or chills.  In the ED he is found to have on chest x-ray multiple rib fractures on the right, right upper lobe pneumonia as well as a large left pleural effusion with midlung airspace opacities which could represent pneumonia.  Initial blood pressure was a little low with systolics in the 57W but has responded well to some IV fluids, he is not febrile, blood work is essentially unremarkable, of note WBC count is 6.3 and lactic acid is normal at 1.8.  Admission has been requested.  Past Medical/Surgical History: Past Medical History:  Diagnosis Date  . Allergy   . Arthritis   . Blood transfusion   . Candidiasis of the esophagus 07-17-2000   EGD  . Cataract    bil eyes  . Colon polyps 05-27-2002   Tubulovillous Adenoma-Colonoscopy  . Diverticulosis of colon (without mention of hemorrhage) 2008,2006,2003   Colonoscopy  . Esophageal reflux 2008   EGD  . Glaucoma   . Hiatal hernia 2002,2003,2013   EGD  . Hyperparathyroidism, unspecified ([place])   . Hypertension   . Internal hemorrhoids without mention of  complication 6203   Colonoscopy  . Multiple falls    Pt falls asleep without warning and has encounter multiple falls  . Obstructive sleep apnea (adult) (pediatric)   . Other vitamin B12 deficiency anemia   . Pneumonia    years ago  . Restless legs syndrome (RLS)   . Stricture and stenosis of esophagus 05-27-2002   EGD  . Unspecified asthma(493.90)     Past Surgical History:  Procedure Laterality Date  . arm surgery    . CATARACT EXTRACTION Bilateral   . COLONOSCOPY    . EYE SURGERY Bilateral    cataracts iol  . HIP SURGERY Right 1998   replacement  . LUMBAR FUSION  x 3   most recent June 2016  . LUMBAR LAMINECTOMY/ DECOMPRESSION WITH MET-RX N/A 11/23/2014   Procedure: HARDWARE REMOVAL WITH MET-RX;  Surgeon: Jovita Gamma, MD;  Location: [place];  Service: Neurosurgery;  Laterality: N/A;  removal of lumbar posterior instrumentation  . LUMBAR WOUND DEBRIDEMENT N/A 03/12/2014   Procedure: LUMBAR WOUND DEBRIDEMENT;  Surgeon: Elaina Hoops, MD;  Location: [place];  Service: Neurosurgery;  Laterality: N/A;  LUMBAR WOUND DEBRIDEMENT  . MASS EXCISION  10/14/2011   Procedure: EXCISION MASS;  Surgeon: Wynonia Sours, MD;  Location: [place];  Service: Orthopedics;  Laterality: Left;   left hand  . PARATHYROIDECTOMY  2008  . PICC LINE PLACE PERIPHERAL ([place] HX) Left   . TOTAL KNEE ARTHROPLASTY Left 1998   replacement  Social History:  reports that he has never smoked. He has never used smokeless tobacco. He reports that he does not drink alcohol or use drugs.  Allergies: No Known Allergies  Family History:  Family History  Problem Relation Age of Onset  . Heart disease Father   . Brain cancer Unknown        3 brothers  . Heart disease Sister   . Leukemia Brother   . Colon cancer Neg Hx   . Esophageal cancer Neg Hx   . Rectal cancer Neg Hx   . Stomach cancer Neg Hx   . Diabetes Neg Hx   . Kidney disease Neg Hx   . Liver disease Neg Hx     Prior  to Admission medications   Medication Sig Start Date End Date Taking? Authorizing Provider  B Complex-C (B-COMPLEX WITH VITAMIN C) tablet Take 1 tablet by mouth daily.   Yes [provider]  bimatoprost (LUMIGAN) 0.01 % SOLN Place 1 drop into both eyes at bedtime.   Yes [provider]  esomeprazole (NEXIUM) 40 MG capsule Take 1 capsule (40 mg total) by mouth daily at 12 noon. This was filled by Dr [name] 03/28/16  Yes Irene Shipper, MD  gabapentin (NEURONTIN) 300 MG capsule Take 300-600 mg by mouth 3 (three) times daily. Take 2 capsules (600 mg) every morning, 1 capsule (300 mg) at noon, 2 capsules (600 mg) at bedtime   Yes [provider]  levofloxacin (LEVAQUIN) 500 MG tablet TAKE ONE TABLET BY MOUTH ONCE DAILY. 12/24/16  Yes Campbell Riches, MD  oxyCODONE-acetaminophen (PERCOCET) 5-325 MG tablet Take 1 tablet by mouth every 6 (six) hours as needed for moderate pain.  09/18/17  Yes [provider]  pramipexole (MIRAPEX) 1 MG tablet Take 2 mg by mouth 3 (three) times daily.    Yes [provider]    Review of Systems:  Constitutional: Denies fever, chills, diaphoresis, appetite change and fatigue.  HEENT: Denies photophobia, eye pain, redness, hearing loss, ear pain, congestion, sore throat, rhinorrhea, sneezing, mouth sores, trouble swallowing, neck pain, neck stiffness and tinnitus.   Respiratory: Denies SOB, DOE, , chest tightness,  and wheezing.   Positive for cough Cardiovascular: Denies chest pain, palpitations and leg swelling.  Gastrointestinal: Denies nausea, vomiting, abdominal pain, diarrhea, constipation, blood in stool and abdominal distention.  Genitourinary: Denies dysuria, urgency, frequency, hematuria, flank pain and difficulty urinating.  Endocrine: Denies: hot or cold intolerance, sweats, changes in hair or nails, polyuria, polydipsia. Musculoskeletal: Denies myalgias, back pain, joint swelling, arthralgias and gait problem.    Skin: Denies pallor, rash and wound.  Neurological: Denies dizziness, seizures, syncope, weakness, light-headedness, numbness and headaches.  Hematological: Denies adenopathy. Easy bruising, personal or family bleeding history  Psychiatric/Behavioral: Denies suicidal ideation, mood changes, confusion, nervousness, sleep disturbance and agitation    Physical Exam: Vitals:   09/20/17 1400 09/20/17 1406 09/20/17 1430 09/20/17 1500  BP: (!) 118/59 107/62 99/67 122/69  Pulse:      Resp: '14 20  18  ' Temp:      TempSrc:      SpO2:      Weight:      Height:         Constitutional: NAD, calm, comfortable, malnourished, cachectic and contorted into awkward positions in the bed Eyes: PERRL, lids and conjunctivae normal ENMT: Mucous membranes are moist. Posterior pharynx clear of any exudate or lesions.Normal dentition.  Neck: normal, supple, no masses, no thyromegaly Respiratory: clear to  auscultation bilaterally, no wheezing, no crackles. Normal respiratory effort. No accessory muscle use.  Cardiovascular: Regular rate and rhythm, no murmurs / rubs / gallops. No extremity edema. 2+ pedal pulses. No carotid bruits.  Abdomen: no tenderness, no masses palpated. No hepatosplenomegaly. Bowel sounds positive.  Musculoskeletal: no clubbing / cyanosis. No joint deformity upper and lower extremities. Good ROM, no contractures. Normal muscle tone.  Skin: no rashes, lesions, ulcers. No induration Neurologic: CN 2-12 grossly intact. Sensation intact, DTR normal. Strength 5/5 in all 4.  Psychiatric: Normal judgment and insight. Alert and oriented x 3. Normal mood.    Labs on Admission: I have personally reviewed the following labs and imaging studies  CBC: Recent Labs  Lab 09/20/17 1300  WBC 6.3  NEUTROABS 4.5  HGB 9.0*  HCT 29.3*  MCV 89.1  PLT 395   Basic Metabolic Panel: Recent Labs  Lab 09/20/17 1300  NA 140  K 4.6  CL 110  CO2 23  GLUCOSE 100*  BUN 36*  CREATININE 1.15   CALCIUM 7.4*   GFR: Estimated Creatinine Clearance: 36.1 mL/min (by C-G formula based on SCr of 1.15 mg/dL). Liver Function Tests: Recent Labs  Lab 09/20/17 1300  AST 19  ALT 10*  ALKPHOS 87  BILITOT 0.6  PROT 6.0*  ALBUMIN 1.7*   No results for input(s): LIPASE, AMYLASE in the last 168 hours. No results for input(s): AMMONIA in the last 168 hours. Coagulation Profile: No results for input(s): INR, PROTIME in the last 168 hours. Cardiac Enzymes: Recent Labs  Lab 09/20/17 1300  TROPONINI <0.03   BNP (last 3 results) No results for input(s): PROBNP in the last 8760 hours. HbA1C: No results for input(s): HGBA1C in the last 72 hours. CBG: No results for input(s): GLUCAP in the last 168 hours. Lipid Profile: No results for input(s): CHOL, HDL, LDLCALC, TRIG, CHOLHDL, LDLDIRECT in the last 72 hours. Thyroid Function Tests: No results for input(s): TSH, T4TOTAL, FREET4, T3FREE, THYROIDAB in the last 72 hours. Anemia Panel: No results for input(s): VITAMINB12, FOLATE, FERRITIN, TIBC, IRON, RETICCTPCT in the last 72 hours. Urine analysis:    Component Value Date/Time   COLORURINE AMBER (A) 09/20/2017 1430   APPEARANCEUR HAZY (A) 09/20/2017 1430   LABSPEC 1.020 09/20/2017 1430   PHURINE 5.0 09/20/2017 1430   GLUCOSEU NEGATIVE 09/20/2017 1430   HGBUR NEGATIVE 09/20/2017 1430   BILIRUBINUR NEGATIVE 09/20/2017 1430   KETONESUR NEGATIVE 09/20/2017 1430   PROTEINUR NEGATIVE 09/20/2017 1430   UROBILINOGEN 0.2 04/09/2007 1456   NITRITE NEGATIVE 09/20/2017 1430   LEUKOCYTESUR NEGATIVE 09/20/2017 1430   Sepsis Labs: '@LABRCNTIP' (procalcitonin:4,lacticidven:4) ) Recent Results (from the past 240 hour(s))  Blood culture (routine x 2)     Status: None (Preliminary result)   Collection Time: 09/20/17  1:25 PM  Result Value Ref Range Status   Specimen Description LEFT ANTECUBITAL  Final   Special Requests   Final    BOTTLES DRAWN AEROBIC AND ANAEROBIC Blood Culture adequate  volume Performed at [facility], [address], [place], [place] [address]    Culture PENDING  Incomplete   Report Status PENDING  Incomplete  Blood culture (routine x 2)     Status: None (Preliminary result)   Collection Time: 09/20/17  1:32 PM  Result Value Ref Range Status   Specimen Description BLOOD RIGHT HAND  Final   Special Requests   Final    BOTTLES DRAWN AEROBIC AND ANAEROBIC Blood Culture adequate volume Performed at [facility], [address] [place],  [place] [address]    Culture PENDING  Incomplete   Report Status PENDING  Incomplete     Radiological Exams on Admission: Dg Chest Portable 1 View  Result Date: 09/20/2017 CLINICAL DATA:  Back pain.  Hypertension history of pneumonia. EXAM: PORTABLE CHEST 1 VIEW COMPARISON:  10/19/2015 FINDINGS: Obscuration of the left hemidiaphragm with left mid lung airspace opacity and potential left basilar airspace opacity. Suspected left pleural effusion with density peripherally in the left chest. The patient is rotated to the left on today's radiograph, reducing diagnostic sensitivity and specificity. Multiple healing right rib fractures. Peripheral interstitial accentuation in the right upper lobe. Difficult to assess the heart due to obscuration of cardiac margins and leftward rotation. Left glenohumeral joint appears dislocated. Possible right medial basilar airspace opacity. IMPRESSION: 1. Marked leftward rotation reduces diagnostic sensitivity. 2. Large left pleural effusion with left basilar and mid lung airspace opacities which could be from pneumonia or atelectasis. 3. Peripheral confluent interstitial opacity in the right upper lobe could reflect atypical infectious process but is technically nonspecific. 4. Multiple interval rib fractures since the prior exam from 10/19/2015 on the right, with callus formation from healing response. 5. Dislocated right glenohumeral joint, as on recent shoulder radiographs. 6. Chest CT could be  utilized to further characterize the abnormalities in the chest, if clinically warranted. Electronically Signed   By: Van Clines M.D.   On: 09/20/2017 13:07    EKG: Independently reviewed.  Normal sinus rhythm with occasional PACs, no acute ischemic changes  Assessment/Plan Active Problems:   CAP (community acquired pneumonia)   Acute on chronic respiratory failure with hypoxia (HCC)   Malnutrition (HCC)   FTT (failure to thrive) in adult    Community-acquired pneumonia/acute hypoxemic respiratory failure -This is an 84 year old gentleman who according to his son was in remarkable health up until 3 years ago: he would get on a ladder to the top of his roof to clean his gutters, who has had multiple complications following a back surgery including discitis and osteomyelitis requiring long-term antibiotic therapy. -Since then he has become very deconditioned and essentially bedbound and can only get around in a motorized scooter. -He had a fall 2 weeks ago with multiple rib fractures and I suspect because of pain he has been having shallow inspirations and has subsequently developed a pneumonia.  This on top of his chronic dysphagia which per patient and son has improved.  Patient states he does best on a soft food texture which I have ordered. -Blood and sputum cultures have been requested. -Strep pneumo and Legionella urine antigens have been requested. -Given possibility of aspiration we will go ahead and start him on Zosyn (this is been discussed with Dr. Johnnye Sima with ID).  Will hold his chronic Levaquin while hospitalized and this will need to be resumed upon discharge. -Patient's family requesting transfer to [facility].  Vertebral osteomyelitis/discitis -On chronic suppressive therapy with Levaquin. -As discussed via phone with Dr. Johnnye Sima, will hold Levaquin while on Zosyn, Levaquin will need to be resumed upon hospital discharge.  Malnutrition/weakness/deconditioning -Check  B12/TSH. -Request dietitian evaluation. -Request PT evaluation. -We will also request palliative care evaluation to formally discuss CODE STATUS and goals of care.   DVT prophylaxis: Subcutaneous heparin Code Status: Discussed with patient and son, for now they will elect to remain full code.  We will request palliative care evaluation to further discuss goals of care in this deconditioned elderly gentleman Family Communication: Son at bedside Disposition Plan: Transfer to [facility]  per patient and family request Consults called: Curbside ID phone consultation Admission status: Inpatient   Time Spent: 85 minutes  Estela Isaac Bliss MD [facility] Pager [phone]  If 7PM-7AM, please contact night-coverage [email or web address] Password [email or web address]  09/20/2017, 5:04 PM

## 2017-09-20 NOTE — Progress Notes (Signed)
Pharmacy Antibiotic Note  Derrick Sanchez is a 84 y.o. male admitted on 09/20/2017 with aspiration pneumonia Pharmacy has been consulted for zosyn dosing.  Plan: Zosyn 3.375g IV q8h (4 hour infusion).  Height: 6\' 2"  (785 cm) Weight: 120 lb (54.4 kg) IBW/kg (Calculated) : 82.2  Temp (24hrs), Avg:97.4 F (36.3 C), Min:97.4 F (36.3 C), Max:97.4 F (36.3 C)  Recent Labs  Lab 09/20/17 1300 09/20/17 1325  WBC 6.3  --   CREATININE 1.15  --   LATICACIDVEN  --  1.8    Estimated Creatinine Clearance: 36.1 mL/min (by C-G formula based on SCr of 1.15 mg/dL).    No Known Allergies  Antimicrobials this admission: Rocephin 1 gm IV x 1 dose Azithromycin 500mg  iv x 1   Thank you for allowing pharmacy to be a part of this patient's care.  Jerrye Beavers, PharmD, BCPS Clinical Pharmacist

## 2017-09-20 NOTE — ED Notes (Signed)
[facility] here to transport pt.   

## 2017-09-20 NOTE — Progress Notes (Signed)
Callback from admitting provider who advised to page the on call night provider with the information about missing medications. Paged the on call provider regarding the missing medications that patient son stated pt is on. Will c/t monitor

## 2017-09-20 NOTE — Progress Notes (Signed)
Patient son reports there were two medications he did not report to provider that MD should be aware of including Trazadone 25mg  at HS and Xarelto. Paged on call provider to notify of patient arrival to floor and about these medications. Will c/t monitor.

## 2017-09-20 NOTE — ED Provider Notes (Signed)
[facility] Provider Note   CSN: [id number] Arrival date & time: 09/20/17  1223     History   Chief Complaint Chief Complaint  Patient presents with  . Back Pain  . Cough    HPI Derrick Sanchez is a 84 y.o. male with a past medical history as outlined below and significant for HTN, asthma and chronic back pain with failed lumbar surgeries resulting in bilateral lower extremities parathesia who is chronically bedbound and/or wheelchair bound, usually able to assist with transfers only but with increased weakness of the past several weeks.  In association he has worsened pain in his lower back after a fall at home several week ago but imaging of his cervical spine through his lumbar spine by his neurosurgeon Dr. Carloyn Manner, per son's hx were negative for acute fractures.  Son also reports a recent chronically dislocated right shoulder which has failed reduction and for which he is not a surgical candidate.  He has increasing shortness of breath with a productive cough and increasing weakness which he states started about a month ago but getting worse. Poor PO intake the past week per son.   He has found no alleviators for his symptoms.   The history is provided by the patient and a relative.  Back Pain   Associated symptoms include weakness. Pertinent negatives include no chest pain, no fever, no numbness, no headaches and no abdominal pain.  Cough  Associated symptoms include shortness of breath and wheezing. Pertinent negatives include no chest pain, no headaches and no sore throat.    Past Medical History:  Diagnosis Date  . Allergy   . Arthritis   . Blood transfusion   . Candidiasis of the esophagus 07-17-2000   EGD  . Cataract    bil eyes  . Colon polyps 05-27-2002   Tubulovillous Adenoma-Colonoscopy  . Diverticulosis of colon (without mention of hemorrhage) 2008,2006,2003   Colonoscopy  . Esophageal reflux 2008   EGD  . Glaucoma   . Hiatal hernia  2002,2003,2013   EGD  . Hyperparathyroidism, unspecified ([place])   . Hypertension   . Internal hemorrhoids without mention of complication 5364   Colonoscopy  . Multiple falls    Pt falls asleep without warning and has encounter multiple falls  . Obstructive sleep apnea (adult) (pediatric)   . Other vitamin B12 deficiency anemia   . Pneumonia    years ago  . Restless legs syndrome (RLS)   . Stricture and stenosis of esophagus 05-27-2002   EGD  . Unspecified asthma(493.90)     Patient Active Problem List   Diagnosis Date Noted  . PVD (peripheral vascular disease) ([place]) 08/04/2016  . End of life care 08/04/2016  . Urinary hesitancy 11/26/2015  . Essential hypertension 01/05/2015  . Lumbar radiculopathy 11/23/2014  . Spinal epidural abscess 03/12/2014  . Protein-calorie malnutrition, severe ([place]) 03/01/2014  . Frequent falls 02/27/2014  . Trochanteric fracture ([place]) 02/27/2014  . Lumbar stenosis with neurogenic claudication 02/06/2014  . Stiffness of joints, not elsewhere classified, multiple sites 12/22/2012  . Postural imbalance 12/22/2012  . Hx of falling 12/22/2012  . HYPERPARATHYROIDISM UNSPECIFIED 05/03/2010  . OBSTRUCTIVE SLEEP APNEA 05/03/2010  . GLAUCOMA 05/03/2010  . ASTHMA 05/03/2010  . ANEMIA, VITAMIN B12 DEFICIENCY 06/14/2007  . RESTLESS LEG SYNDROME 06/14/2007  . GASTROESOPHAGEAL REFLUX DISEASE 06/14/2007  . DIVERTICULOSIS, COLON 05/31/2007  . POLYP, COLON 05/27/2002    Past Surgical History:  Procedure Laterality Date  . arm surgery    .  CATARACT EXTRACTION Bilateral   . COLONOSCOPY    . EYE SURGERY Bilateral    cataracts iol  . HIP SURGERY Right 1998   replacement  . LUMBAR FUSION  x 3   most recent June 2016  . LUMBAR LAMINECTOMY/ DECOMPRESSION WITH MET-RX N/A 11/23/2014   Procedure: HARDWARE REMOVAL WITH MET-RX;  Surgeon: Jovita Gamma, MD;  Location: [facility];  Service: Neurosurgery;  Laterality: N/A;  removal of lumbar posterior  instrumentation  . LUMBAR WOUND DEBRIDEMENT N/A 03/12/2014   Procedure: LUMBAR WOUND DEBRIDEMENT;  Surgeon: Elaina Hoops, MD;  Location: [facility];  Service: Neurosurgery;  Laterality: N/A;  LUMBAR WOUND DEBRIDEMENT  . MASS EXCISION  10/14/2011   Procedure: EXCISION MASS;  Surgeon: Wynonia Sours, MD;  Location: [facility];  Service: Orthopedics;  Laterality: Left;   left hand  . PARATHYROIDECTOMY  2008  . PICC LINE PLACE PERIPHERAL ([place] HX) Left   . TOTAL KNEE ARTHROPLASTY Left 1998   replacement        Home Medications    Prior to Admission medications   Medication Sig Start Date End Date Taking? Authorizing Provider  B Complex-C (B-COMPLEX WITH VITAMIN C) tablet Take 1 tablet by mouth daily.   Yes [provider]  bimatoprost (LUMIGAN) 0.01 % SOLN Place 1 drop into both eyes at bedtime.   Yes [provider]  esomeprazole (NEXIUM) 40 MG capsule Take 1 capsule (40 mg total) by mouth daily at 12 noon. This was filled by Dr [name] 03/28/16  Yes Irene Shipper, MD  gabapentin (NEURONTIN) 300 MG capsule Take 300-600 mg by mouth 3 (three) times daily. Take 2 capsules (600 mg) every morning, 1 capsule (300 mg) at noon, 2 capsules (600 mg) at bedtime   Yes [provider]  levofloxacin (LEVAQUIN) 500 MG tablet TAKE ONE TABLET BY MOUTH ONCE DAILY. 12/24/16  Yes Campbell Riches, MD  oxyCODONE-acetaminophen (PERCOCET) 5-325 MG tablet Take 1 tablet by mouth every 6 (six) hours as needed for moderate pain.  09/18/17  Yes [provider]  pramipexole (MIRAPEX) 1 MG tablet Take 2 mg by mouth 3 (three) times daily.    Yes [provider]    Family History Family History  Problem Relation Age of Onset  . Heart disease Father   . Brain cancer Unknown        3 brothers  . Heart disease Sister   . Leukemia Brother   . Colon cancer Neg Hx   . Esophageal cancer Neg Hx   . Rectal cancer Neg Hx   . Stomach cancer Neg Hx   . Diabetes Neg  Hx   . Kidney disease Neg Hx   . Liver disease Neg Hx     Social History Social History   Tobacco Use  . Smoking status: Never Smoker  . Smokeless tobacco: Never Used  Substance Use Topics  . Alcohol use: No    Alcohol/week: 0.0 oz  . Drug use: No     Allergies   Patient has no known allergies.   Review of Systems Review of Systems  Constitutional: Positive for fatigue. Negative for fever.  HENT: Negative for congestion and sore throat.   Eyes: Negative.   Respiratory: Positive for cough, shortness of breath and wheezing. Negative for chest tightness.   Cardiovascular: Negative for chest pain.  Gastrointestinal: Negative for abdominal pain, nausea and vomiting.  Genitourinary: Negative.   Musculoskeletal: Positive for back pain. Negative for arthralgias,  joint swelling and neck pain.  Skin: Negative.   Neurological: Positive for weakness. Negative for dizziness, light-headedness, numbness and headaches.  Psychiatric/Behavioral: Negative.      Physical Exam Updated Vital Signs BP 107/62   Pulse (!) 59   Temp (!) 97.4 F (36.3 C) (Oral)   Resp 20   Ht '6\' 2"'  (1.88 m)   Wt 54.4 kg (120 lb)   SpO2 99%   BMI 15.41 kg/m   Physical Exam  Constitutional: He appears well-developed. He has a sickly appearance.  HENT:  Head: Normocephalic and atraumatic.  Mouth/Throat: Mucous membranes are dry.  Eyes: Conjunctivae are normal.  Neck: Normal range of motion.  Cardiovascular: Normal rate, regular rhythm, normal heart sounds and intact distal pulses.  Pulmonary/Chest: Effort normal. He has no wheezes. He has rhonchi in the right middle field, the right lower field, the left middle field and the left lower field. He has rales in the right lower field and the left lower field.  Abdominal: Soft. Bowel sounds are normal. There is no tenderness.  Musculoskeletal: Normal range of motion.       Right shoulder: He exhibits deformity.  Neurological: He is alert.  Skin: Skin is  warm and dry.  Poor turgor skin bilateral lower legs with chronic venous changes and several superficial lower leg ulcerations/skin breakdown noted. Dressings in place at these sites on first presentation.  Psychiatric: He has a normal mood and affect.  Nursing note and vitals reviewed.    ED Treatments / Results  Labs (all labs ordered are listed, but only abnormal results are displayed) Labs Reviewed  CBC WITH DIFFERENTIAL/PLATELET - Abnormal; Notable for the following components:      Result Value   RBC 3.29 (*)    Hemoglobin 9.0 (*)    HCT 29.3 (*)    RDW 16.8 (*)    All other components within normal limits  COMPREHENSIVE METABOLIC PANEL - Abnormal; Notable for the following components:   Glucose, Bld 100 (*)    BUN 36 (*)    Calcium 7.4 (*)    Total Protein 6.0 (*)    Albumin 1.7 (*)    ALT 10 (*)    GFR calc non Af Amer 56 (*)    All other components within normal limits  URINALYSIS, ROUTINE W REFLEX MICROSCOPIC - Abnormal; Notable for the following components:   Color, Urine AMBER (*)    APPearance HAZY (*)    All other components within normal limits  CULTURE, BLOOD (ROUTINE X 2)  CULTURE, BLOOD (ROUTINE X 2)  LACTIC ACID, PLASMA  TROPONIN I    EKG EKG Interpretation  Date/Time:  Sunday September 20 2017 12:30:12 EDT Ventricular Rate:  69 PR Interval:    QRS Duration: 109 QT Interval:  446 QTC Calculation: 478 R Axis:   -49 Text Interpretation:  Sinus rhythm Atrial premature complex Left anterior fascicular block Borderline T wave abnormalities Borderline prolonged QT interval Since last tracing rate slower Confirmed by Noemi Chapel [phone]) on 09/20/2017 12:43:36 PM   Radiology Dg Chest Portable 1 View  Result Date: 09/20/2017 CLINICAL DATA:  Back pain.  Hypertension history of pneumonia. EXAM: PORTABLE CHEST 1 VIEW COMPARISON:  10/19/2015 FINDINGS: Obscuration of the left hemidiaphragm with left mid lung airspace opacity and potential left basilar airspace  opacity. Suspected left pleural effusion with density peripherally in the left chest. The patient is rotated to the left on today's radiograph, reducing diagnostic sensitivity and specificity. Multiple healing right rib fractures. Peripheral  interstitial accentuation in the right upper lobe. Difficult to assess the heart due to obscuration of cardiac margins and leftward rotation. Left glenohumeral joint appears dislocated. Possible right medial basilar airspace opacity. IMPRESSION: 1. Marked leftward rotation reduces diagnostic sensitivity. 2. Large left pleural effusion with left basilar and mid lung airspace opacities which could be from pneumonia or atelectasis. 3. Peripheral confluent interstitial opacity in the right upper lobe could reflect atypical infectious process but is technically nonspecific. 4. Multiple interval rib fractures since the prior exam from 10/19/2015 on the right, with callus formation from healing response. 5. Dislocated right glenohumeral joint, as on recent shoulder radiographs. 6. Chest CT could be utilized to further characterize the abnormalities in the chest, if clinically warranted. Electronically Signed   By: Van Clines M.D.   On: 09/20/2017 13:07    Procedures Procedures (including critical care time)  Medications Ordered in ED Medications  sodium chloride 0.9 % bolus 1,000 mL (1,000 mLs Intravenous New Bag/Given 09/20/17 1415)  sodium chloride 0.9 % bolus 500 mL (0 mLs Intravenous Stopped 09/20/17 1414)  cefTRIAXone (ROCEPHIN) 1 g in sodium chloride 0.9 % 100 mL IVPB (0 g Intravenous Stopped 09/20/17 1402)  azithromycin (ZITHROMAX) 500 mg in sodium chloride 0.9 % 250 mL IVPB (500 mg Intravenous New Bag/Given 09/20/17 1336)     Initial Impression / Assessment and Plan / ED Course  I have reviewed the triage vital signs and the nursing notes.  Pertinent labs & imaging results that were available during my care of the patient were reviewed by me and considered in  my medical decision making (see chart for details).     Patient with suspected community acquired pneumonia with significant hypoxia upon first presentation.  There is a questionable left pleural effusion as well with right-sided rib fractures.  Son describes a fall which occurred several weeks ago per HPI with back pain since the event but negative spinal imaging.  It is possible these rib fractures occurred during that fall were not picked up at that evaluation.  He is not septic today.  He was given IV fluids and treated for community-acquired pneumonia with Rocephin and Zithromax.  Pt discussed with Dr. Jerilee Hoh who agrees with admission.  Final Clinical Impressions(s) / ED Diagnoses   Final diagnoses:  Community acquired pneumonia of right upper lobe of lung ([place])  Hypoxia  Closed fracture of multiple ribs of right side, initial encounter  Chronic low back pain without sciatica, unspecified back pain laterality    ED Discharge Orders    None       Landis Martins 09/20/17 1504    Noemi Chapel, MD 09/22/17 [phone]

## 2017-09-20 NOTE — ED Notes (Signed)
Pt's vitals improved after O2 administration and positioning in bed.

## 2017-09-21 ENCOUNTER — Encounter (HOSPITAL_COMMUNITY): Payer: Self-pay | Admitting: Radiology

## 2017-09-21 ENCOUNTER — Inpatient Hospital Stay (HOSPITAL_COMMUNITY): Payer: PPO

## 2017-09-21 DIAGNOSIS — J90 Pleural effusion, not elsewhere classified: Secondary | ICD-10-CM

## 2017-09-21 DIAGNOSIS — D649 Anemia, unspecified: Secondary | ICD-10-CM

## 2017-09-21 LAB — INFLUENZA PANEL BY PCR (TYPE A & B)
INFLAPCR: NEGATIVE
INFLBPCR: NEGATIVE

## 2017-09-21 LAB — STREP PNEUMONIAE URINARY ANTIGEN: STREP PNEUMO URINARY ANTIGEN: NEGATIVE

## 2017-09-21 MED ORDER — HEPARIN SODIUM (PORCINE) 5000 UNIT/ML IJ SOLN
5000.0000 [IU] | Freq: Three times a day (TID) | INTRAMUSCULAR | Status: DC
  Administered 2017-09-22 – 2017-09-28 (×18): 5000 [IU] via SUBCUTANEOUS
  Filled 2017-09-21 (×18): qty 1

## 2017-09-21 MED ORDER — IOHEXOL 300 MG/ML  SOLN
75.0000 mL | Freq: Once | INTRAMUSCULAR | Status: AC | PRN
  Administered 2017-09-21: 75 mL via INTRAVENOUS

## 2017-09-21 MED ORDER — PANTOPRAZOLE SODIUM 40 MG PO TBEC
40.0000 mg | DELAYED_RELEASE_TABLET | Freq: Two times a day (BID) | ORAL | Status: DC
  Administered 2017-09-21 – 2017-09-29 (×17): 40 mg via ORAL
  Filled 2017-09-21 (×18): qty 1

## 2017-09-21 NOTE — Progress Notes (Signed)
Chart reviewed. Patient at CT during visit. No family at bedside. Per RN, son has gone home. Voicemail left for son, [name] to arrange War meeting. Awaiting return call. PMT will f/u tomorrow.   NO CHARGE  Ihor Dow, FNP-C Palliative Medicine Team  Phone: [phone] Fax: [phone]

## 2017-09-21 NOTE — Evaluation (Signed)
SLP Cancellation Note  Patient Details Name: MATTTHEW ZIOMEK MRN: [id number] DOB: 01-07-1931   Cancelled treatment:       Reason Eval/Treat Not Completed: Other (comment)(pt going for CT at this time, will continue efforts)   Macario Golds 09/21/2017, 2:24 PM  Luanna Salk, [facility] SLP [phone]

## 2017-09-21 NOTE — Progress Notes (Signed)
[facility] PROGRESS NOTE  Derrick Sanchez DZH:[id number] DOB: [birth year] DOA: 09/20/2017  PCP: [name], MD  Brief History/Interval Summary: Derrick Sanchez is a 84 y.o. male with history significant for dysphagia, discitis and osteomyelitis requiring long-term antibiotics now on daily suppression with Levaquin who 2 weeks ago suffered a fall while transferring himself from his motorized scooter into the bed.  He fell on his right side and had immediate right-sided chest pain.  Sometime thereafter he visited Dr. Christy Sartorius who is his neurosurgeon who imaged his back. Son states there was no mention of rib fractures at that time.  He presented because that right-sided chest pain has continued despite oxycodone. He developed a cough productive of yellow sputum and congestion.  In the ED he is found to have on chest x-ray multiple rib fractures on the right, right upper lobe pneumonia as well as a large left pleural effusion with midlung airspace opacities which could represent pneumonia.  Initial blood pressure was a little low with systolics in the 41D but has responded well to some IV fluids, he is not febrile, blood work is essentially unremarkable, of note WBC count is 6.3 and lactic acid is normal at 1.8.  Patient was hospitalized for further management.  Reason for Visit: Pneumonia.  Left pleural effusion.  Consultants: Palliative medicine  Procedures: None yet  Antibiotics: Zosyn  Subjective/Interval History: Patient somewhat lethargic.  Easily arousable.  Continues to have cough.  Some pain on the right side at the site of his rib fracture.  His son is at the bedside.  ROS: No nausea vomiting.  Objective:  Vital Signs  Vitals:   09/20/17 1830 09/20/17 1900 09/20/17 2114 09/21/17 0548  BP: 125/84 114/70 122/68 (!) 119/51  Pulse:   62 67  Resp: 17 18  20   Temp:   97.6 F (36.4 C) 98.3 F (36.8 C)  TempSrc:   Oral Oral  SpO2:   98% 90%  Weight:      Height:         Intake/Output Summary (Last 24 hours) at 09/21/2017 1204 Last data filed at 09/21/2017 0656 Gross per 24 hour  Intake 2506.24 ml  Output 300 ml  Net 2206.24 ml   Filed Weights   09/20/17 1229  Weight: 54.4 kg (120 lb)    General appearance: Distracted.  Somewhat lethargic at times.  No distress. Head: Normocephalic, without obvious abnormality, atraumatic Resp: Coarse breath sounds bilaterally.  Diminished air entry at the left side.  No wheezing.  No rhonchi.  Crackles are present at the bases.  Mildly tachypneic at rest. Cardio: regular rate and rhythm, S1, S2 normal, no murmur, click, rub or gallop GI: soft, non-tender; bowel sounds normal; no masses,  no organomegaly Skin: Chronic skin changes noted both lower extremities. Left leg covered in a dressing. Neurologic: No focal neurological deficit.  Generalized deconditioning is noted.  Lab Results:  Data Reviewed: I have personally reviewed following labs and imaging studies  CBC: Recent Labs  Lab 09/20/17 1300  WBC 6.3  NEUTROABS 4.5  HGB 9.0*  HCT 29.3*  MCV 89.1  PLT 622    Basic Metabolic Panel: Recent Labs  Lab 09/20/17 1300  NA 140  K 4.6  CL 110  CO2 23  GLUCOSE 100*  BUN 36*  CREATININE 1.15  CALCIUM 7.4*    GFR: Estimated Creatinine Clearance: 36.1 mL/min (by C-G formula based on SCr of 1.15 mg/dL).  Liver Function Tests: Recent Labs  Lab 09/20/17 1300  AST 19  ALT 10*  ALKPHOS 87  BILITOT 0.6  PROT 6.0*  ALBUMIN 1.7*    Cardiac Enzymes: Recent Labs  Lab 09/20/17 1300  TROPONINI <0.03     Recent Results (from the past 240 hour(s))  Blood culture (routine x 2)     Status: None (Preliminary result)   Collection Time: 09/20/17  1:25 PM  Result Value Ref Range Status   Specimen Description LEFT ANTECUBITAL  Final   Special Requests   Final    BOTTLES DRAWN AEROBIC AND ANAEROBIC Blood Culture adequate volume   Culture   Final    NO GROWTH < 24 HOURS Performed at [facility], [address], [place], [place] [address]    Report Status PENDING  Incomplete  Blood culture (routine x 2)     Status: None (Preliminary result)   Collection Time: 09/20/17  1:32 PM  Result Value Ref Range Status   Specimen Description BLOOD RIGHT HAND  Final   Special Requests   Final    BOTTLES DRAWN AEROBIC AND ANAEROBIC Blood Culture adequate volume   Culture   Final    NO GROWTH < 24 HOURS Performed at [facility], [address], [place], [place] [address]    Report Status PENDING  Incomplete      Radiology Studies: Dg Chest Portable 1 View  Result Date: 09/20/2017 CLINICAL DATA:  Back pain.  Hypertension history of pneumonia. EXAM: PORTABLE CHEST 1 VIEW COMPARISON:  10/19/2015 FINDINGS: Obscuration of the left hemidiaphragm with left mid lung airspace opacity and potential left basilar airspace opacity. Suspected left pleural effusion with density peripherally in the left chest. The patient is rotated to the left on today's radiograph, reducing diagnostic sensitivity and specificity. Multiple healing right rib fractures. Peripheral interstitial accentuation in the right upper lobe. Difficult to assess the heart due to obscuration of cardiac margins and leftward rotation. Left glenohumeral joint appears dislocated. Possible right medial basilar airspace opacity. IMPRESSION: 1. Marked leftward rotation reduces diagnostic sensitivity. 2. Large left pleural effusion with left basilar and mid lung airspace opacities which could be from pneumonia or atelectasis. 3. Peripheral confluent interstitial opacity in the right upper lobe could reflect atypical infectious process but is technically nonspecific. 4. Multiple interval rib fractures since the prior exam from 10/19/2015 on the right, with callus formation from healing response. 5. Dislocated right glenohumeral joint, as on recent shoulder radiographs. 6. Chest CT could be utilized to further characterize the abnormalities in the chest, if  clinically warranted. Electronically Signed   By: Van Clines M.D.   On: 09/20/2017 13:07     Medications:  Scheduled: . B-complex with vitamin C  1 tablet Oral Daily  . docusate sodium  100 mg Oral BID  . gabapentin  300 mg Oral Q1200  . gabapentin  600 mg Oral BID AC & HS  . heparin  5,000 Units Subcutaneous Q8H  . latanoprost  1 drop Both Eyes QHS  . pramipexole  2 mg Oral TID   Continuous: . sodium chloride 75 mL/hr at 09/21/17 0814  . piperacillin-tazobactam (ZOSYN)  IV 3.375 g (09/21/17 0959)   TIR:[id number] **OR** acetaminophen, ondansetron **OR** ondansetron (ZOFRAN) IV, oxyCODONE-acetaminophen  Assessment/Plan:  Active Problems:   CAP (community acquired pneumonia)   Acute on chronic respiratory failure with hypoxia ([place])   Malnutrition ([place])   FTT (failure to thrive) in adult    Community-acquired pneumonia with concern for aspiration as well/acute respiratory failure with hypoxia Patient is on Levaquin chronically  for history of discitis.  Patient started on Zosyn after discussions with Dr. Johnnye Sima.  Levaquin has been held for now.  Large left-sided pleural effusion Unclear how long this has been present.  We will proceed with a CT chest to get a better visualization of his lungs and the effusion.  May benefit from thoracentesis.  History of Left  lower extremity DVT Diagnosed in 07/2016 and was on Xarelto.  Hold for now in case patient needs to undergo thoracentesis.  History of vertebral osteomyelitis/discitis He is on chronic suppressive treatment with Levaquin.  Continue to hold for now while patient is on Zosyn.  Generalized weakness and deconditioning PT OT evaluation.  Dietitian evaluation.  Continue multivitamins.  Lower extremity wounds Wound care has been consulted.  Normocytic anemia No evidence of overt bleeding.  Repeat labs tomorrow.  Goals of care Discussed in detail with the son who is the healthcare power of attorney.  Patient  kept nodding off during this conversation.  Patient has had a general decline in his strength and functioning over the last 6 months.  However son tells me that from a medical standpoint he has been stable.  He is agreeable to a palliative medicine consult for goals of care.  CODE STATUS was also discussed.  He will discuss it further with his mother and other family members.  DVT Prophylaxis: On Xarelto at home which has been held for now    Code Status: Full code for now Family Communication: Discussed with patient and his son Disposition Plan: Management as outlined above.  Await CT chest.    LOS: 1 day   [facility] Pager [phone] 09/21/2017, 12:04 PM  If 7PM-7AM, please contact night-coverage at [email or web address], password [facility]

## 2017-09-21 NOTE — Consult Note (Signed)
[facility] Nurse wound consult note Reason for Consult:fall at home two weeks ago.  Bilateral lower legs and arms are bruised and abrasions present from trauma.  Abrasion to left posterior lower leg as well. Was on Xarelto  Holding for now.  Deep tissue injury to  Bilateral trochanters Wound type: trauma and pressure Pressure Injury POA: Yes Measurement: Right trochanter:  1 cm x 2 cm maroon discoloration Left trochanter :  2 cm x 2 cm maroon intact discoloration LEft posterior lower leg:  2 cm x 1.2 cm boggy discolored tissue Scattered abrasions to arms and legs Bruising present to upper and lower extremities Wound DXA:JOINOMV /deep tissue injury Drainage (amount, consistency, odor) none noted Periwound:bruising Dressing procedure/placement/frequency:Cleanse left lower leg with NS.  Apply Xeroform to posterior leg wound.  Wrap with kerlix and tape.  Change Monday/Wednesday/Friday. SiIlicone border foam to bilateral hips to pad and protect. Will order low air loss matress Will not follow at this time.  Please re-consult if needed.  Domenic Moras RN BSN [facility] Pager [phone]

## 2017-09-22 ENCOUNTER — Inpatient Hospital Stay (HOSPITAL_COMMUNITY): Payer: PPO

## 2017-09-22 DIAGNOSIS — E46 Unspecified protein-calorie malnutrition: Secondary | ICD-10-CM

## 2017-09-22 DIAGNOSIS — Z515 Encounter for palliative care: Secondary | ICD-10-CM

## 2017-09-22 DIAGNOSIS — S2241XA Multiple fractures of ribs, right side, initial encounter for closed fracture: Secondary | ICD-10-CM

## 2017-09-22 DIAGNOSIS — J181 Lobar pneumonia, unspecified organism: Principal | ICD-10-CM

## 2017-09-22 DIAGNOSIS — J90 Pleural effusion, not elsewhere classified: Secondary | ICD-10-CM

## 2017-09-22 DIAGNOSIS — I361 Nonrheumatic tricuspid (valve) insufficiency: Secondary | ICD-10-CM

## 2017-09-22 DIAGNOSIS — Z7189 Other specified counseling: Secondary | ICD-10-CM

## 2017-09-22 DIAGNOSIS — L8990 Pressure ulcer of unspecified site, unspecified stage: Secondary | ICD-10-CM

## 2017-09-22 LAB — BASIC METABOLIC PANEL
ANION GAP: 6 (ref 5–15)
BUN: 25 mg/dL — AB (ref 6–20)
CO2: 21 mmol/L — AB (ref 22–32)
Calcium: 7.3 mg/dL — ABNORMAL LOW (ref 8.9–10.3)
Chloride: 114 mmol/L — ABNORMAL HIGH (ref 101–111)
Creatinine, Ser: 1.04 mg/dL (ref 0.61–1.24)
GFR calc Af Amer: >60 mL/min (ref >60)
GFR calc non Af Amer: >60 mL/min (ref >60)
Glucose, Bld: 89 mg/dL (ref 65–99)
POTASSIUM: 4.3 mmol/L (ref 3.5–5.1)
Sodium: 141 mmol/L (ref 135–145)

## 2017-09-22 LAB — CBC
HEMATOCRIT: 27.4 % — AB (ref 39.0–52.0)
Hemoglobin: 8.2 g/dL — ABNORMAL LOW (ref 13.0–17.0)
MCH: 27.6 pg (ref 26.0–34.0)
MCHC: 29.9 g/dL — ABNORMAL LOW (ref 30.0–36.0)
MCV: 92.3 fL (ref 78.0–100.0)
PLATELETS: 149 10*3/uL — AB (ref 150–400)
RBC: 2.97 MIL/uL — ABNORMAL LOW (ref 4.22–5.81)
RDW: 17.3 % — AB (ref 11.5–15.5)
WBC: 4.3 10*3/uL (ref 4.0–10.5)

## 2017-09-22 LAB — ECHOCARDIOGRAM COMPLETE
Height: 74 in
WEIGHTICAEL: 1920 [oz_av]

## 2017-09-22 LAB — HIV ANTIBODY (ROUTINE TESTING W REFLEX): HIV SCREEN 4TH GENERATION: NONREACTIVE

## 2017-09-22 LAB — LEGIONELLA PNEUMOPHILA SEROGP 1 UR AG: L. PNEUMOPHILA SEROGP 1 UR AG: NEGATIVE

## 2017-09-22 LAB — BRAIN NATRIURETIC PEPTIDE: B Natriuretic Peptide: 241.4 pg/mL — ABNORMAL HIGH (ref 0.0–100.0)

## 2017-09-22 MED ORDER — FUROSEMIDE 10 MG/ML IJ SOLN
20.0000 mg | Freq: Once | INTRAMUSCULAR | Status: AC
  Administered 2017-09-22: 20 mg via INTRAVENOUS
  Filled 2017-09-22: qty 2

## 2017-09-22 MED ORDER — IPRATROPIUM-ALBUTEROL 0.5-2.5 (3) MG/3ML IN SOLN
3.0000 mL | Freq: Once | RESPIRATORY_TRACT | Status: AC
  Administered 2017-09-22: 3 mL via RESPIRATORY_TRACT
  Filled 2017-09-22: qty 3

## 2017-09-22 NOTE — Progress Notes (Signed)
  Echocardiogram 2D Echocardiogram has been performed.  Bobbye Charleston 09/22/2017, 2:17 PM

## 2017-09-22 NOTE — Consult Note (Signed)
Consultation Note Date: 09/22/2017   Patient Name: Derrick Sanchez  DOB: 09/13/1930  MRN: [id number]  Age / Sex: 84 y.o., male  PCP: [name], MD Referring Physician: Bonnielee Haff, MD  Reason for Consultation: Establishing goals of care  HPI/Patient Profile: 84 y.o. male  with past medical history of back surgery s/p complications with discitis and osteomyelitis requiring long-term antibiotics, dysphagia, falls, hiatal hernia, HTN, DVT, esophageal stricture, and asthma admitted on 09/20/2017 with right chest pain, cough and congestion. About two weeks ago, the patient suffered from a fall when transferring from motorized scooter to bed. Visited with neurosurgeon who imaged his back. Per son, no mention of rib fractures at that time. Continued to have right-sided chest pain despite oxycodone. In ED, chest xray revealed multiple right rib fractures, right upper lobe pneumonia, and large left pleural effusion with midlung airspace opacities which could represent pneumonia. CT chest revealed moderate bilateral pleural effusions. FULL code. Palliative medicine consultation for goals of care.    Clinical Assessment and Goals of Care:  I have reviewed medical records, discussed with care team, and completed patient assessment. Patient is awake, alert, oriented. Denies pain or discomfort during my visit.  Attempted to arrange [place] today with son, [name] but he will is working and will not be at the hospital until after 5pm. Spoke with him via telephone.  Introduced Palliative Medicine as specialized medical care for people living with serious illness. It focuses on providing relief from the symptoms and stress of a serious illness. The goal is to improve quality of life for both the patient and the family.  We briefly discussed his father's health decline since back surgery about four years ago. Prior to hospitalization,  living home in [place] with wife. Baseline, bed/wheelchair bound. He requires assist with most ADL's. [name] is an only child and has hired caregivers for 9 hours a day.   [name] describes his father as "active" and independent prior to back surgery/declining health secondary to infections. [name] states "he loves to work" and would work from "sun up to sun down." It has been challenging for his father to face declining health and that he is not independent as he once was.   Discussed hospital diagnoses and interventions. [name] feels his father is doing "better" today. He is waiting on speech to evaluate him. [name] is hopeful his father will continue to show improvement. "Stay on top of the pneumonia." He also acknowledges they have many "hurdles to cross" in regards to his health.   [name] speaks of the importance of keeping his father home as long as possible--and staying out of a nursing facility.   Questions and concerns were addressed. [name] and I plan to meet tomorrow morning, 8am for further GOC discussion.    SUMMARY OF RECOMMENDATIONS    Brief initial conversation with son, [name]. Unable to be at hospital until this evening. We plan to meet tomorrow, 4/10 at 8am to further discuss [place].   Continue FULL code/FULL scope.   Code Status/Advance Care  Planning:  Full code  Symptom Management:   Per attending  Palliative Prophylaxis:   Aspiration, Delirium Protocol, Oral Care and Turn Reposition  Additional Recommendations (Limitations, Scope, Preferences):  Full Scope Treatment  Psycho-social/Spiritual:   Desire for further Chaplaincy support:yes  Additional Recommendations: Caregiving  Support/Resources  Prognosis:   Unable to determine  Discharge Planning: To Be Determined      Primary Diagnoses: Present on Admission: . CAP (community acquired pneumonia)   I have reviewed the medical record, interviewed the patient and family, and examined the patient. The following  aspects are pertinent.  Past Medical History:  Diagnosis Date  . Allergy   . Arthritis   . Blood transfusion   . Candidiasis of the esophagus 07-17-2000   EGD  . Cataract    bil eyes  . Colon polyps 05-27-2002   Tubulovillous Adenoma-Colonoscopy  . Diverticulosis of colon (without mention of hemorrhage) 2008,2006,2003   Colonoscopy  . Esophageal reflux 2008   EGD  . Glaucoma   . Hiatal hernia 2002,2003,2013   EGD  . Hyperparathyroidism, unspecified ([place])   . Hypertension   . Internal hemorrhoids without mention of complication 7628   Colonoscopy  . Multiple falls    Pt falls asleep without warning and has encounter multiple falls  . Obstructive sleep apnea (adult) (pediatric)   . Other vitamin B12 deficiency anemia   . Pneumonia    years ago  . Restless legs syndrome (RLS)   . Stricture and stenosis of esophagus 05-27-2002   EGD  . Unspecified asthma(493.90)    Social History   Socioeconomic History  . Marital status: Married    Spouse name: Mary  . Number of children: 1  . Years of education: 8  . Highest education level: Not on file  Occupational History  . Occupation: Retired    Comment: Librarian, academic in supply room  Social Needs  . Financial resource strain: Not on file  . Food insecurity:    Worry: Not on file    Inability: Not on file  . Transportation needs:    Medical: Not on file    Non-medical: Not on file  Tobacco Use  . Smoking status: Never Smoker  . Smokeless tobacco: Never Used  Substance and Sexual Activity  . Alcohol use: No    Alcohol/week: 0.0 oz  . Drug use: No  . Sexual activity: Not on file  Lifestyle  . Physical activity:    Days per week: Not on file    Minutes per session: Not on file  . Stress: Not on file  Relationships  . Social connections:    Talks on phone: Not on file    Gets together: Not on file    Attends religious service: Not on file    Active member of club or organization: Not on file    Attends meetings of  clubs or organizations: Not on file    Relationship status: Not on file  Other Topics Concern  . Not on file  Social History Narrative   Lives at home with wife, Stanton Kidney      Caffeine use - soda 2 a day   Family History  Problem Relation Age of Onset  . Heart disease Father   . Brain cancer Unknown        3 brothers  . Heart disease Sister   . Leukemia Brother   . Colon cancer Neg Hx   . Esophageal cancer Neg Hx   . Rectal cancer Neg Hx   .  Stomach cancer Neg Hx   . Diabetes Neg Hx   . Kidney disease Neg Hx   . Liver disease Neg Hx    Scheduled Meds: . B-complex with vitamin C  1 tablet Oral Daily  . docusate sodium  100 mg Oral BID  . gabapentin  300 mg Oral Q1200  . gabapentin  600 mg Oral BID AC & HS  . heparin  5,000 Units Subcutaneous Q8H  . latanoprost  1 drop Both Eyes QHS  . pantoprazole  40 mg Oral BID  . pramipexole  2 mg Oral TID   Continuous Infusions: . sodium chloride 75 mL/hr at 09/22/17 0835  . piperacillin-tazobactam (ZOSYN)  IV Stopped (09/22/17 1416)   PRN Meds:.acetaminophen **OR** acetaminophen, ondansetron **OR** ondansetron (ZOFRAN) IV, oxyCODONE-acetaminophen Medications Prior to Admission:  Prior to Admission medications   Medication Sig Start Date End Date Taking? Authorizing Provider  albuterol (PROVENTIL HFA) 108 (90 Base) MCG/ACT inhaler Inhale 2 puffs into the lungs every 6 (six) hours as needed for wheezing.   Yes [provider]  B Complex-C (B-COMPLEX WITH VITAMIN C) tablet Take 1 tablet by mouth daily.   Yes [provider]  bimatoprost (LUMIGAN) 0.01 % SOLN Place 1 drop into both eyes at bedtime.   Yes [provider]  esomeprazole (NEXIUM) 40 MG capsule Take 1 capsule (40 mg total) by mouth daily at 12 noon. This was filled by Dr [name] 03/28/16  Yes Irene Shipper, MD  ferrous sulfate 325 (65 FE) MG tablet Take 1 tablet by mouth daily.   Yes [provider]  gabapentin (NEURONTIN) 300 MG capsule  Take 300-600 mg by mouth 3 (three) times daily. Take 2 capsules (600 mg) every morning, 1 capsule (300 mg) at noon, 2 capsules (600 mg) at bedtime   Yes [provider]  levofloxacin (LEVAQUIN) 500 MG tablet TAKE ONE TABLET BY MOUTH ONCE DAILY. 12/24/16  Yes Campbell Riches, MD  oxyCODONE-acetaminophen (PERCOCET) 5-325 MG tablet Take 1 tablet by mouth every 6 (six) hours as needed for moderate pain.  09/18/17  Yes [provider]  pramipexole (MIRAPEX) 1 MG tablet Take 2 mg by mouth 3 (three) times daily.    Yes [provider]  traZODone (DESYREL) 50 MG tablet Take 25-50 mg by mouth at bedtime as needed for sleep. 09/14/17  Yes [provider]  XARELTO 20 MG TABS tablet Take 20 mg by mouth daily. 08/26/17  Yes [provider]   No Known Allergies Review of Systems  Constitutional: Positive for activity change and appetite change.  Respiratory: Positive for shortness of breath.   Neurological: Positive for weakness.   Physical Exam  Constitutional: He appears cachectic. He is cooperative. He appears ill.  Pulmonary/Chest: No accessory muscle usage. No tachypnea. No respiratory distress.  Abdominal: Normal appearance.  Neurological: He is alert.  Oriented to person/place  Skin: Skin is warm and dry. There is pallor.  Psychiatric: He has a normal mood and affect. His speech is normal and behavior is normal.  Nursing note and vitals reviewed.   Vital Signs: BP 107/60 (BP Location: Left Arm)   Pulse 65   Temp 98.3 F (36.8 C) (Oral)   Resp (!) 24   Ht 6\' 2"  (1.88 m)   Wt 54.4 kg (120 lb)   SpO2 (!) 88%   BMI 15.41 kg/m  Pain Scale: 0-10   Pain Score: 0-No pain   SpO2: SpO2: (!) 88 % O2 Device:SpO2: (!) 88 %  O2 Flow Rate: .O2 Flow Rate (L/min): 4 L/min  IO: Intake/output summary:   Intake/Output Summary (Last 24 hours) at 09/22/2017 1540 Last data filed at 09/22/2017 0934 Gross per 24 hour  Intake 1932.5 ml  Output 600 ml  Net 1332.5  ml    LBM: Last BM Date: 09/17/17 Baseline Weight: Weight: 54.4 kg (120 lb) Most recent weight: Weight: 54.4 kg (120 lb)     Palliative Assessment/Data: PPS 40%   Flowsheet Rows     Most Recent Value  Intake Tab  Referral Department  Hospitalist  Unit at Time of Referral  Med/Surg Unit  Palliative Care Primary Diagnosis  Pulmonary  Date Notified  09/20/17  Palliative Care Type  New Palliative care  Reason for referral  Clarify Goals of Care  Date of Admission  09/20/17  # of days IP prior to Palliative referral  0  Clinical Assessment  Psychosocial & Spiritual Assessment  Palliative Care Outcomes      Time In/Out: 1324-4010, 2725-3664 Time Total: 76min Greater than 50%  of this time was spent counseling and coordinating care related to the above assessment and plan.  Signed by:  Ihor Dow, FNP-C Palliative Medicine Team  Phone: [phone] Fax: [phone]  Please contact Palliative Medicine Team phone at [phone] for questions and concerns.  For individual provider: See Shea Evans

## 2017-09-22 NOTE — Evaluation (Signed)
Clinical/Bedside Swallow Evaluation Patient Details  Name: Derrick Sanchez MRN: [id number] Date of Birth: 09-14-1930  Today's Date: 09/22/2017 Time: SLP Start Time (ACUTE ONLY): 1310 SLP Stop Time (ACUTE ONLY): 1330 SLP Time Calculation (min) (ACUTE ONLY): 20 min  Past Medical History:  Past Medical History:  Diagnosis Date  . Allergy   . Arthritis   . Blood transfusion   . Candidiasis of the esophagus 07-17-2000   EGD  . Cataract    bil eyes  . Colon polyps 05-27-2002   Tubulovillous Adenoma-Colonoscopy  . Diverticulosis of colon (without mention of hemorrhage) 2008,2006,2003   Colonoscopy  . Esophageal reflux 2008   EGD  . Glaucoma   . Hiatal hernia 2002,2003,2013   EGD  . Hyperparathyroidism, unspecified ([place])   . Hypertension   . Internal hemorrhoids without mention of complication 5170   Colonoscopy  . Multiple falls    Pt falls asleep without warning and has encounter multiple falls  . Obstructive sleep apnea (adult) (pediatric)   . Other vitamin B12 deficiency anemia   . Pneumonia    years ago  . Restless legs syndrome (RLS)   . Stricture and stenosis of esophagus 05-27-2002   EGD  . Unspecified asthma(493.90)    Past Surgical History:  Past Surgical History:  Procedure Laterality Date  . arm surgery    . CATARACT EXTRACTION Bilateral   . COLONOSCOPY    . EYE SURGERY Bilateral    cataracts iol  . HIP SURGERY Right 1998   replacement  . LUMBAR FUSION  x 3   most recent June 2016  . LUMBAR LAMINECTOMY/ DECOMPRESSION WITH MET-RX N/A 11/23/2014   Procedure: HARDWARE REMOVAL WITH MET-RX;  Surgeon: Jovita Gamma, MD;  Location: [facility];  Service: Neurosurgery;  Laterality: N/A;  removal of lumbar posterior instrumentation  . LUMBAR WOUND DEBRIDEMENT N/A 03/12/2014   Procedure: LUMBAR WOUND DEBRIDEMENT;  Surgeon: Elaina Hoops, MD;  Location: [facility];  Service: Neurosurgery;  Laterality: N/A;  LUMBAR WOUND DEBRIDEMENT  . MASS EXCISION  10/14/2011    Procedure: EXCISION MASS;  Surgeon: Wynonia Sours, MD;  Location: [facility];  Service: Orthopedics;  Laterality: Left;   left hand  . PARATHYROIDECTOMY  2008  . PICC LINE PLACE PERIPHERAL ([place] HX) Left   . TOTAL KNEE ARTHROPLASTY Left 1998   replacement   HPI:  84 year old male admitted 09/20/17 with PNA. PMH significant for    Assessment / Plan / Recommendation Clinical Impression  Pt known to ST services, having completed MBS in November 2018. Results of this study indicated moderate to severe oral and pharyngeal dysphagia and primary esophageal dysphagia. This places pt at very high risk for ongoing aspiration, malnutrition, and dehydration. Pt exhibits a congested nonproductive cough, even in the absence of po trials, therefore it is difficult to determine what consistencies are safe, and which are not.  Will downgrade diet to puree and thin liquids and await information from Palliative Care meeting with family. Significant improvement in swallow function and safety is not anticipated. In fact, it is more likely that swallow function and safety have deteriorated over the last 5 months. Repeat MBS will allow determination of safe consistencies, however, it may also reveal that pt is unsafe for continued po intake. GOC pending. SLP available for education.   SLP Visit Diagnosis: Dysphagia, oropharyngeal phase (R13.12)    Aspiration Risk  Severe aspiration risk    Diet Recommendation Dysphagia 1 (Puree);Thin liquid   Liquid  Administration via: Cup;Straw Medication Administration: Whole meds with puree Supervision: Staff to assist with self feeding;Full supervision/cueing for compensatory strategies Compensations: Minimize environmental distractions;Slow rate;Small sips/bites;Hard cough after swallow;Clear throat after each swallow;Multiple dry swallows after each bite/sip Postural Changes: Seated upright at 90 degrees;Remain upright for at least 30 minutes after po intake     Other  Recommendations Oral Care Recommendations: Oral care QID   Follow up Recommendations 24 hour supervision/assistance      Frequency and Duration min 1 x/week  1 week       Prognosis Prognosis for Safe Diet Advancement: Guarded Barriers to Reach Goals: Severity of deficits      Swallow Study   General Date of Onset: 09/20/17 HPI: 84 year old male admitted 09/20/17 with PNA. PMH significant for  Type of Study: Bedside Swallow Evaluation Previous Swallow Assessment: MBS 05/01/17 revealed mod-severe oropharyngeal dysphagia and primary esophageal dysphagia. recommended puree and thin liquids. High aspiration risk with continued po intake Diet Prior to this Study: Dysphagia 2 (chopped);Thin liquids Temperature Spikes Noted: No Respiratory Status: Nasal cannula History of Recent Intubation: No Behavior/Cognition: Alert;Cooperative;Pleasant mood Oral Cavity Assessment: Within Functional Limits Oral Care Completed by SLP: No Oral Cavity - Dentition: Adequate natural dentition Vision: Functional for self-feeding Self-Feeding Abilities: Able to feed self;Needs set up;Needs assist Patient Positioning: Upright in bed Baseline Vocal Quality: Low vocal intensity Volitional Cough: Weak;Congested Volitional Swallow: Able to elicit    Oral/Motor/Sensory Function Overall Oral Motor/Sensory Function: (generalized weakness)   Ice Chips Ice chips: Not tested   Thin Liquid Thin Liquid: Within functional limits Presentation: Self Fed;Straw    Nectar Thick Nectar Thick Liquid: Not tested   Honey Thick Honey Thick Liquid: Not tested   Puree Puree: Within functional limits Presentation: Spoon   Solid   GO   Solid: Impaired Oral Phase Impairments: Impaired mastication Oral Phase Functional Implications: Prolonged oral transit;Oral residue       Celia B. Quentin Ore [facility], [place] Speech Language Pathologist [phone]  Shonna Chock 09/22/2017,1:34 PM

## 2017-09-22 NOTE — Progress Notes (Signed)
[facility] PROGRESS NOTE  Derrick Sanchez MVH:[id number] DOB: 13-Feb-1931 DOA: 09/20/2017  PCP: [name], MD  Brief History/Interval Summary: Derrick Sanchez is a 84 y.o. male with history significant for dysphagia, discitis and osteomyelitis requiring long-term antibiotics now on daily suppression with Levaquin who 2 weeks ago suffered a fall while transferring himself from his motorized scooter into the bed.  He fell on his right side and had immediate right-sided chest pain.  Sometime thereafter he visited Dr. Carloyn Manner who is his neurosurgeon who imaged his back. Son states there was no mention of rib fractures at that time.  He presented because that right-sided chest pain has continued despite oxycodone. He developed a cough productive of yellow sputum and congestion.  In the ED he is found to have on chest x-ray multiple rib fractures on the right, right upper lobe pneumonia as well as a large left pleural effusion with midlung airspace opacities which could represent pneumonia.  Initial blood pressure was a little low with systolics in the 84X but has responded well to some IV fluids, he is not febrile, blood work is essentially unremarkable, of note WBC count is 6.3 and lactic acid is normal at 1.8.  Patient was hospitalized for further management.  CT scan of the chest was done which showed moderate bilateral pleural effusion.  Palliative medicine was consulted.  Reason for Visit: Pneumonia.  Pleural effusion.  Consultants: Palliative medicine  Procedures: None yet  Antibiotics: Zosyn  Subjective/Interval History: Patient continues to have a cough.  This is observed when he is eating and drinking.  Continues to have some shortness of breath.  Denies any pain issues.  His son is at the bedside.    ROS: No nausea or vomiting  Objective:  Vital Signs  Vitals:   09/20/17 2114 09/21/17 0548 09/21/17 2019 09/22/17 0430  BP: 122/68 (!) 119/51 (!) 109/57 (!) 104/49  Pulse: 62 67 71  67  Resp:  20 20 (!) 21  Temp: 97.6 F (36.4 C) 98.3 F (36.8 C) 98.1 F (36.7 C) 98.7 F (37.1 C)  TempSrc: Oral Oral Oral Oral  SpO2: 98% 90% 91% 92%  Weight:      Height:        Intake/Output Summary (Last 24 hours) at 09/22/2017 0916 Last data filed at 09/22/2017 0800 Gross per 24 hour  Intake 2291.25 ml  Output 600 ml  Net 1691.25 ml   Filed Weights   09/20/17 1229  Weight: 54.4 kg (120 lb)    General appearance: Distracted.  In no distress. Resp: Coarse breath sounds bilaterally.  Diminished air entry at the bases.  Crackles are present bilaterally.  No wheezing.  Tachypneic at rest. Cardio: S1-S2 is normal regular.  No S3-S4.  No rubs murmurs or bruit. GI: Abdomen is soft.  Nontender nondistended.  Bowel sounds are present.  No masses organomegaly Skin: Chronic skin changes noted both lower extremities. Left leg covered in a dressing. Neurologic: No obvious focal neurological deficits.  Lab Results:  Data Reviewed: I have personally reviewed following labs and imaging studies  CBC: Recent Labs  Lab 09/20/17 1300 09/22/17 0553  WBC 6.3 4.3  NEUTROABS 4.5  --   HGB 9.0* 8.2*  HCT 29.3* 27.4*  MCV 89.1 92.3  PLT 172 149*    Basic Metabolic Panel: Recent Labs  Lab 09/20/17 1300 09/22/17 0553  NA 140 141  K 4.6 4.3  CL 110 114*  CO2 23 21*  GLUCOSE 100* 89  BUN  36* 25*  CREATININE 1.15 1.04  CALCIUM 7.4* 7.3*    GFR: Estimated Creatinine Clearance: 40 mL/min (by C-G formula based on SCr of 1.04 mg/dL).  Liver Function Tests: Recent Labs  Lab 09/20/17 1300  AST 19  ALT 10*  ALKPHOS 87  BILITOT 0.6  PROT 6.0*  ALBUMIN 1.7*    Cardiac Enzymes: Recent Labs  Lab 09/20/17 1300  TROPONINI <0.03     Recent Results (from the past 240 hour(s))  Blood culture (routine x 2)     Status: None (Preliminary result)   Collection Time: 09/20/17  1:25 PM  Result Value Ref Range Status   Specimen Description LEFT ANTECUBITAL  Final   Special  Requests   Final    BOTTLES DRAWN AEROBIC AND ANAEROBIC Blood Culture adequate volume   Culture   Final    NO GROWTH 2 DAYS Performed at [facility], [address] [place], [place] [address]    Report Status PENDING  Incomplete  Blood culture (routine x 2)     Status: None (Preliminary result)   Collection Time: 09/20/17  1:32 PM  Result Value Ref Range Status   Specimen Description BLOOD RIGHT HAND  Final   Special Requests   Final    BOTTLES DRAWN AEROBIC AND ANAEROBIC Blood Culture adequate volume   Culture   Final    NO GROWTH 2 DAYS Performed at [facility], [address], [place], [place] [address]    Report Status PENDING  Incomplete      Radiology Studies: Ct Chest W Contrast  Result Date: 09/21/2017 CLINICAL DATA:  The patient suffered a fall transferring from a motorized scooter to bed 2 weeks ago with a blow to the right side and onset of right chest pain. Initial encounter. EXAM: CT CHEST WITH CONTRAST TECHNIQUE: Multidetector CT imaging of the chest was performed during intravenous contrast administration. CONTRAST:  75 mL OMNIPAQUE IOHEXOL 300 MG/ML  SOLN COMPARISON:  Single-view of the chest 09/20/2016 and 10/19/2015. Plain films right shoulder 09/10/2017. CT chest 02/02/2014. FINDINGS: Cardiovascular: There is mild cardiomegaly. No pericardial effusion. Extensive calcific aortic and coronary atherosclerosis is identified. Large varix right subclavian vein noted. Mediastinum/Nodes: No axillary, hilar or mediastinal lymphadenopathy. Small to moderate hiatal hernia is noted. Lungs/Pleura: The patient has moderate bilateral pleural effusions. There is associated compressive atelectasis in the posterior aspects of both lower lobes. Patchy airspace disease is identified in the superior segment of the left lower lobe. Upper Abdomen: There is partial visualization of large bilateral renal cysts which appear unchanged. No acute abnormality is identified. Musculoskeletal: The right  shoulder is anteriorly dislocated. Marked anterior subluxation of the left shoulder is identified. The patient has severe cervical spondylosis with 0.5 cm anterolisthesis C5 on C6 and severe loss of disc space height at C5-6 and C6-7. The patient has multiple right rib fractures all of which appear late subacute to remote with callus formation identified about the fractures. No acute fracture is seen. IMPRESSION: Anterior dislocation of the right shoulder as seen on comparison plain films 09/10/2017. Multiple remote right rib fractures. No acute right rib fracture is identified. Marked anterior subluxation of the left humeral head on the glenoid consistent with glenohumeral joint instability. Moderate bilateral pleural effusions. There is associated compressive atelectasis. Patchy airspace disease in the superior segment of the left lower lobe could be due to atelectasis but has an appearance worrisome for pneumonia. Extensive coronary atherosclerosis Advanced cervical spondylosis C5-6 and C6-7. Aortic Atherosclerosis (ICD10-I70.0). Electronically Signed  By: Inge Rise M.D.   On: 09/21/2017 15:49   Dg Chest Portable 1 View  Result Date: 09/20/2017 CLINICAL DATA:  Back pain.  Hypertension history of pneumonia. EXAM: PORTABLE CHEST 1 VIEW COMPARISON:  10/19/2015 FINDINGS: Obscuration of the left hemidiaphragm with left mid lung airspace opacity and potential left basilar airspace opacity. Suspected left pleural effusion with density peripherally in the left chest. The patient is rotated to the left on today's radiograph, reducing diagnostic sensitivity and specificity. Multiple healing right rib fractures. Peripheral interstitial accentuation in the right upper lobe. Difficult to assess the heart due to obscuration of cardiac margins and leftward rotation. Left glenohumeral joint appears dislocated. Possible right medial basilar airspace opacity. IMPRESSION: 1. Marked leftward rotation reduces diagnostic  sensitivity. 2. Large left pleural effusion with left basilar and mid lung airspace opacities which could be from pneumonia or atelectasis. 3. Peripheral confluent interstitial opacity in the right upper lobe could reflect atypical infectious process but is technically nonspecific. 4. Multiple interval rib fractures since the prior exam from 10/19/2015 on the right, with callus formation from healing response. 5. Dislocated right glenohumeral joint, as on recent shoulder radiographs. 6. Chest CT could be utilized to further characterize the abnormalities in the chest, if clinically warranted. Electronically Signed   By: Van Clines M.D.   On: 09/20/2017 13:07     Medications:  Scheduled: . B-complex with vitamin C  1 tablet Oral Daily  . docusate sodium  100 mg Oral BID  . gabapentin  300 mg Oral Q1200  . gabapentin  600 mg Oral BID AC & HS  . heparin  5,000 Units Subcutaneous Q8H  . latanoprost  1 drop Both Eyes QHS  . pantoprazole  40 mg Oral BID  . pramipexole  2 mg Oral TID   Continuous: . sodium chloride 75 mL/hr at 09/22/17 0835  . piperacillin-tazobactam (ZOSYN)  IV Stopped (09/22/17 0431)   NWG:[id number] **OR** acetaminophen, ondansetron **OR** ondansetron (ZOFRAN) IV, oxyCODONE-acetaminophen  Assessment/Plan:  Active Problems:   CAP (community acquired pneumonia)   Acute on chronic respiratory failure with hypoxia (HCC)   Malnutrition (HCC)   FTT (failure to thrive) in adult    Community-acquired pneumonia with concern for aspiration as well/acute respiratory failure with hypoxia Patient is on Levaquin chronically for history of discitis.  Patient started on Zosyn after discussions with Dr. Johnnye Sima.  Levaquin has been held for now.  Await swallow evaluation.  High suspicion of aspiration.  Bilateral pleural effusion Chest x-ray suggested large left-sided pleural effusion.  CT scan of the chest was done which actually showed moderate bilateral pleural effusions.   No history of heart disease.  BNP only minimally elevated.  We will order echocardiogram to further evaluate.  Await palliative medicine input.  Await swallow evaluation as well.  May have to consider thoracentesis if there is no clinical improvement or if the family continues to want aggressive workup.    History of Left  lower extremity DVT Diagnosed in 07/2016 and was on Xarelto.  Hold for now in case patient needs to undergo thoracentesis.  History of vertebral osteomyelitis/discitis He is on chronic suppressive treatment with Levaquin.  Continue to hold for now while patient is on Zosyn as recommended by Dr. Johnnye Sima.  Generalized weakness and deconditioning PT OT evaluation.  Dietitian evaluation.  Continue multivitamins.  Lower extremity wounds Wound care has been consulted.  Normocytic anemia Hemoglobin slightly low today compared to yesterday.  No evidence of overt bleeding.  Likely dilutional drop.  Repeat tomorrow.  Goals of care Await palliative medicine consultation.  Discussed with son yesterday regarding CODE STATUS.  Patient remains full code for now.    DVT Prophylaxis: On Xarelto at home which has been held for now    Code Status: Full code Family Communication: Discussed with patient and his son Disposition Plan: Management as outlined above.  Await echocardiogram and swallow evaluation.    LOS: 2 days   [facility] Pager [phone] 09/22/2017, 9:16 AM  If 7PM-7AM, please contact night-coverage at [email or web address], password [email or web address]

## 2017-09-22 NOTE — Progress Notes (Signed)
Nutrition Note  RD consulted for nutritional assessment.  Noted that patient and family to have [place] discussion. Will provide assessment following GOC decisions.  Clayton Bibles, MS, RD, [place] [facility] Pager: [phone] After Hours Pager: [phone]

## 2017-09-23 DIAGNOSIS — R0902 Hypoxemia: Secondary | ICD-10-CM

## 2017-09-23 DIAGNOSIS — Z9189 Other specified personal risk factors, not elsewhere classified: Secondary | ICD-10-CM

## 2017-09-23 LAB — BASIC METABOLIC PANEL
ANION GAP: 6 (ref 5–15)
BUN: 25 mg/dL — ABNORMAL HIGH (ref 6–20)
CHLORIDE: 109 mmol/L (ref 101–111)
CO2: 25 mmol/L (ref 22–32)
Calcium: 7.4 mg/dL — ABNORMAL LOW (ref 8.9–10.3)
Creatinine, Ser: 1.03 mg/dL (ref 0.61–1.24)
GFR calc Af Amer: >60 mL/min (ref >60)
GFR calc non Af Amer: >60 mL/min (ref >60)
Glucose, Bld: 90 mg/dL (ref 65–99)
POTASSIUM: 3.9 mmol/L (ref 3.5–5.1)
Sodium: 140 mmol/L (ref 135–145)

## 2017-09-23 LAB — CBC
HEMATOCRIT: 28.2 % — AB (ref 39.0–52.0)
HEMOGLOBIN: 8.5 g/dL — AB (ref 13.0–17.0)
MCH: 27.6 pg (ref 26.0–34.0)
MCHC: 30.1 g/dL (ref 30.0–36.0)
MCV: 91.6 fL (ref 78.0–100.0)
Platelets: 138 10*3/uL — ABNORMAL LOW (ref 150–400)
RBC: 3.08 MIL/uL — ABNORMAL LOW (ref 4.22–5.81)
RDW: 17.1 % — ABNORMAL HIGH (ref 11.5–15.5)
WBC: 5.9 10*3/uL (ref 4.0–10.5)

## 2017-09-23 MED ORDER — ENSURE ENLIVE PO LIQD
237.0000 mL | Freq: Two times a day (BID) | ORAL | Status: DC
  Administered 2017-09-23 – 2017-09-28 (×6): 237 mL via ORAL

## 2017-09-23 NOTE — Progress Notes (Signed)
Patient Demographics:    Derrick Sanchez, is a 84 y.o. male, DOB - 1930/12/16, XIP:[id number]  Admit date - 09/20/2017   Admitting Physician Erline Hau, MD  Outpatient Primary MD for the patient is Derrick Noble, MD  LOS - 3   Chief Complaint  Patient presents with  . Back Pain  . Cough        Subjective:    Derrick Sanchez today has no fevers, no emesis,  No chest pain,  Son at bedside, tolerating  modified diet well  Assessment  & Plan :    Active Problems:   Goals of care, counseling/discussion   CAP (community acquired pneumonia)   Acute on chronic respiratory failure with hypoxia (HCC)   Malnutrition (HCC)   FTT (failure to thrive) in adult   Pressure injury of skin   Multiple closed fractures of ribs of right side   Pleural effusion   Palliative care by specialist   Hypoxia   At high risk for aspiration  Reason for Visit: Pneumonia.  Pleural effusion.   Brief History/Interval Summary: Derrick Sanchez is a 84 y.o. male with history significant for dysphagia, discitis and osteomyelitis requiring long-term antibiotics now on daily suppression with Levaquin who 2 weeks ago suffered a fall while transferring himself from his motorized scooter into the bed.  He fell on his right side and had immediate right-sided chest pain.  Sometime thereafter he visited Dr. Carloyn Sanchez who is his neurosurgeon who imaged his back. Son states there was no mention of rib fractures at that time.  He presented because that right-sided chest pain has continued despite oxycodone. He developed a cough productive of yellow sputum and congestion.  In the ED he is found to have on chest x-ray multiple rib fractures on the right, right upper lobe pneumonia as well as a large left pleural effusion with midlung airspace opacities which could represent pneumonia.  Initial blood pressure was a little low with systolics in the  67H but has responded well to some IV fluids, he is not febrile, blood work is essentially unremarkable, of note WBC count is 6.3 and lactic acid is normal at 1.8.  Patient was hospitalized for further management.  CT scan of the chest was done which showed moderate bilateral pleural effusion.  Palliative medicine was consulted.    Plan:- 1)Community-acquired pneumonia with concern for aspiration as well/acute respiratory failure with hypoxia- Patient is on Levaquin chronically for history of discitis.  Patient started on Zosyn after discussions with Dr. Johnnye Sanchez.  Levaquin has been held for now.  Speech and swallow evaluation consult appreciated, High suspicion of aspiration, patient has had extensive imaging and ordered speech workup recently, continue modified diet  2)Bilateral pleural effusion--  Chest x-ray suggested large left-sided pleural effusion.  CT scan of the chest was done which actually showed moderate bilateral pleural effusions.  No history of heart disease.  BNP only minimally elevated.  echocardiogram with EF of 60-65%, repeat chest x-ray on 09/24/2017 given increased oxygen requirement overnight,   discussed with patient's son at bedside, consider thoracentesis if there is no clinical improvement.  Continue to hold anticoagulation for now just in case thoracentesis is needed   3)History of Left  lower extremity DVT Diagnosed  in 07/2016 and was on Xarelto.  Hold for now in case patient needs to undergo thoracentesis.  4)History of vertebral osteomyelitis/discitis He is on chronic suppressive treatment with Levaquin.  Continue to hold for now while patient is on Zosyn as recommended by Dr. Johnnye Sanchez.  5)Generalized weakness and deconditioning-PT eval appreciated,, awaiting OT eval  6)Lower extremity wounds- Wound care has been consulted.  7)Normocytic anemia Hemoglobin slightly low today compared to yesterday.  No evidence of overt bleeding.  Likely dilutional drop.   8)Goals  of care-discussed with palliative care team who spoke with patient and son, had a long chat with patient and patient's son at bedside as well on 09/23/2017, patient remains a full code with full scope of treatment at this time   DVT Prophylaxis: On Xarelto at home which has been held for now    Code Status: Full code Family Communication: Discussed with patient and his son Disposition Plan: TBD Consultants: Palliative medicine  Procedures: None yet  Antibiotics: Zosyn    DVT Prophylaxis  : Xarelto is on hold, subcu heparin for now  Lab Results  Component Value Date   PLT 138 (L) 09/23/2017    Inpatient Medications  Scheduled Meds: . B-complex with vitamin C  1 tablet Oral Daily  . docusate sodium  100 mg Oral BID  . feeding supplement (ENSURE ENLIVE)  237 mL Oral BID BM  . gabapentin  300 mg Oral Q1200  . gabapentin  600 mg Oral BID AC & HS  . heparin  5,000 Units Subcutaneous Q8H  . latanoprost  1 drop Both Eyes QHS  . pantoprazole  40 mg Oral BID  . pramipexole  2 mg Oral TID   Continuous Infusions: . piperacillin-tazobactam (ZOSYN)  IV 3.375 g (09/23/17 1614)   PRN Meds:.acetaminophen **OR** acetaminophen, ondansetron **OR** ondansetron (ZOFRAN) IV, oxyCODONE-acetaminophen    Anti-infectives (From admission, onward)   Start     Dose/Rate Route Frequency Ordered Stop   09/20/17 1730  piperacillin-tazobactam (ZOSYN) IVPB 3.375 g     3.375 g 12.5 mL/hr over 240 Minutes Intravenous Every 8 hours 09/20/17 1719     09/20/17 1315  cefTRIAXone (ROCEPHIN) 1 g in sodium chloride 0.9 % 100 mL IVPB     1 g 200 mL/hr over 30 Minutes Intravenous  Once 09/20/17 1304 09/20/17 1402   09/20/17 1315  azithromycin (ZITHROMAX) 500 mg in sodium chloride 0.9 % 250 mL IVPB     500 mg 250 mL/hr over 60 Minutes Intravenous  Once 09/20/17 1304 09/20/17 1436        Objective:   Vitals:   09/22/17 1700 09/22/17 2031 09/23/17 0612 09/23/17 1424  BP:  (!) 103/55 (!) 109/55 (!)  113/59  Pulse: 62 65 (!) 59 (!) 58  Resp:  (!) 22 (!) 21 18  Temp:  98.3 F (36.8 C) 97.9 F (36.6 C) 97.7 F (36.5 C)  TempSrc:  Oral Oral Oral  SpO2: 93% 93% 91% 96%  Weight:      Height:        Wt Readings from Last 3 Encounters:  09/20/17 54.4 kg (120 lb)  04/07/17 65.8 kg (145 lb)  07/23/16 68 kg (150 lb)     Intake/Output Summary (Last 24 hours) at 09/23/2017 1641 Last data filed at 09/23/2017 1500 Gross per 24 hour  Intake 1590 ml  Output -  Net 1590 ml     Physical Exam  Gen:- Awake Alert, in no acute distress, chronically ill-appearing HEENT:- [place].AT, No sclera icterus  Neck-Supple Neck,No JVD,.  Lungs-diminished in bases, no wheezing CV- S1, S2 normal,  Abd-  +ve B.Sounds, Abd Soft, No tenderness,    Extremity/Skin:- No  edema, good pulses Psych-affect is appropriate, some cognitive deficits  neuro-generalized weakness without new focal deficits,  no tremors   Data Review:   Micro Results Recent Results (from the past 240 hour(s))  Blood culture (routine x 2)     Status: None (Preliminary result)   Collection Time: 09/20/17  1:25 PM  Result Value Ref Range Status   Specimen Description LEFT ANTECUBITAL  Final   Special Requests   Final    BOTTLES DRAWN AEROBIC AND ANAEROBIC Blood Culture adequate volume   Culture   Final    NO GROWTH 3 DAYS Performed at [facility], [address] [place], [place] [address]    Report Status PENDING  Incomplete  Blood culture (routine x 2)     Status: None (Preliminary result)   Collection Time: 09/20/17  1:32 PM  Result Value Ref Range Status   Specimen Description BLOOD RIGHT HAND  Final   Special Requests   Final    BOTTLES DRAWN AEROBIC AND ANAEROBIC Blood Culture adequate volume   Culture   Final    NO GROWTH 3 DAYS Performed at [facility], [address] [place], [place] [address]    Report Status PENDING  Incomplete    Radiology Reports Ct Chest W Contrast  Result Date: 09/21/2017 CLINICAL DATA:   The patient suffered a fall transferring from a motorized scooter to bed 2 weeks ago with a blow to the right side and onset of right chest pain. Initial encounter. EXAM: CT CHEST WITH CONTRAST TECHNIQUE: Multidetector CT imaging of the chest was performed during intravenous contrast administration. CONTRAST:  75 mL OMNIPAQUE IOHEXOL 300 MG/ML  SOLN COMPARISON:  Single-view of the chest 09/20/2016 and 10/19/2015. Plain films right shoulder 09/10/2017. CT chest 02/02/2014. FINDINGS: Cardiovascular: There is mild cardiomegaly. No pericardial effusion. Extensive calcific aortic and coronary atherosclerosis is identified. Large varix right subclavian vein noted. Mediastinum/Nodes: No axillary, hilar or mediastinal lymphadenopathy. Small to moderate hiatal hernia is noted. Lungs/Pleura: The patient has moderate bilateral pleural effusions. There is associated compressive atelectasis in the posterior aspects of both lower lobes. Patchy airspace disease is identified in the superior segment of the left lower lobe. Upper Abdomen: There is partial visualization of large bilateral renal cysts which appear unchanged. No acute abnormality is identified. Musculoskeletal: The right shoulder is anteriorly dislocated. Marked anterior subluxation of the left shoulder is identified. The patient has severe cervical spondylosis with 0.5 cm anterolisthesis C5 on C6 and severe loss of disc space height at C5-6 and C6-7. The patient has multiple right rib fractures all of which appear late subacute to remote with callus formation identified about the fractures. No acute fracture is seen. IMPRESSION: Anterior dislocation of the right shoulder as seen on comparison plain films 09/10/2017. Multiple remote right rib fractures. No acute right rib fracture is identified. Marked anterior subluxation of the left humeral head on the glenoid consistent with glenohumeral joint instability. Moderate bilateral pleural effusions. There is associated  compressive atelectasis. Patchy airspace disease in the superior segment of the left lower lobe could be due to atelectasis but has an appearance worrisome for pneumonia. Extensive coronary atherosclerosis Advanced cervical spondylosis C5-6 and C6-7. Aortic Atherosclerosis (ICD10-I70.0). Electronically Signed   By: Inge Rise M.D.   On: 09/21/2017 15:49   Dg Chest Portable 1 View  Result Date: 09/20/2017  CLINICAL DATA:  Back pain.  Hypertension history of pneumonia. EXAM: PORTABLE CHEST 1 VIEW COMPARISON:  10/19/2015 FINDINGS: Obscuration of the left hemidiaphragm with left mid lung airspace opacity and potential left basilar airspace opacity. Suspected left pleural effusion with density peripherally in the left chest. The patient is rotated to the left on today's radiograph, reducing diagnostic sensitivity and specificity. Multiple healing right rib fractures. Peripheral interstitial accentuation in the right upper lobe. Difficult to assess the heart due to obscuration of cardiac margins and leftward rotation. Left glenohumeral joint appears dislocated. Possible right medial basilar airspace opacity. IMPRESSION: 1. Marked leftward rotation reduces diagnostic sensitivity. 2. Large left pleural effusion with left basilar and mid lung airspace opacities which could be from pneumonia or atelectasis. 3. Peripheral confluent interstitial opacity in the right upper lobe could reflect atypical infectious process but is technically nonspecific. 4. Multiple interval rib fractures since the prior exam from 10/19/2015 on the right, with callus formation from healing response. 5. Dislocated right glenohumeral joint, as on recent shoulder radiographs. 6. Chest CT could be utilized to further characterize the abnormalities in the chest, if clinically warranted. Electronically Signed   By: Van Clines M.D.   On: 09/20/2017 13:07     CBC Recent Labs  Lab 09/20/17 1300 09/22/17 0553 09/23/17 0614  WBC 6.3  4.3 5.9  HGB 9.0* 8.2* 8.5*  HCT 29.3* 27.4* 28.2*  PLT 172 149* 138*  MCV 89.1 92.3 91.6  MCH 27.4 27.6 27.6  MCHC 30.7 29.9* 30.1  RDW 16.8* 17.3* 17.1*  LYMPHSABS 1.3  --   --   MONOABS 0.4  --   --   EOSABS 0.0  --   --   BASOSABS 0.0  --   --     Chemistries  Recent Labs  Lab 09/20/17 1300 09/22/17 0553 09/23/17 0614  NA 140 141 140  K 4.6 4.3 3.9  CL 110 114* 109  CO2 23 21* 25  GLUCOSE 100* 89 90  BUN 36* 25* 25*  CREATININE 1.15 1.04 1.03  CALCIUM 7.4* 7.3* 7.4*  AST 19  --   --   ALT 10*  --   --   ALKPHOS 87  --   --   BILITOT 0.6  --   --    ------------------------------------------------------------------------------------------------------------------ No results for input(s): CHOL, HDL, LDLCALC, TRIG, CHOLHDL, LDLDIRECT in the last 72 hours.  No results found for: HGBA1C ------------------------------------------------------------------------------------------------------------------ No results for input(s): TSH, T4TOTAL, T3FREE, THYROIDAB in the last 72 hours.  Invalid input(s): FREET3 ------------------------------------------------------------------------------------------------------------------ No results for input(s): VITAMINB12, FOLATE, FERRITIN, TIBC, IRON, RETICCTPCT in the last 72 hours.  Coagulation profile No results for input(s): INR, PROTIME in the last 168 hours.  No results for input(s): DDIMER in the last 72 hours.  Cardiac Enzymes Recent Labs  Lab 09/20/17 1300  TROPONINI <0.03   ------------------------------------------------------------------------------------------------------------------    Component Value Date/Time   BNP 241.4 (H) 09/22/2017 8546    Roxan Hockey M.D on 09/23/2017 at 4:41 PM  Between 7am to 7pm - Pager - [phone]  After 7pm go to [email or web address] - password [email or web address]  [facility] -  Office  [phone]   Voice Recognition Viviann Spare dictation system was used to create this note, attempts  have been made to correct errors. Please contact the author with questions and/or clarifications.

## 2017-09-23 NOTE — Progress Notes (Signed)
  Speech Language Pathology Treatment: Dysphagia  Patient Details Name: Derrick Sanchez MRN: [id number] DOB: 1930-10-09 Today's Date: 09/23/2017 Time: 0263-7858 SLP Time Calculation (min) (ACUTE ONLY): 46 min  Assessment / Plan / Recommendation Clinical Impression  Pt seen to assess po tolerance and reinforce effective compensation strategies.  Pt partially reclined on his side - allowed SLP to help reposition him.  SLP provided hydration to oxygen and set up oral suction providing it to pt to use.    Pt's tray arrived and SLP assisted him to eat his puree meat, vegetable and potatoes as well as soda.  Congested breathing noted at baseline but worsened during intake.  Pt able to cough and expectorate viscous yellow tinged secretions before po intake.  Later during intake, pt expectorated secretions that were consistent with the color of the puree meat he was consuming.  At that time, SLP advised pt to cease his meal to allow adequate rest break due to concern for pharyngeal residuals and aspiration.  Advised pt to maximize liquid nutrition if easier for him to swallow and provided him with vanilla Ensure.  RN reports pt now to receive Ensures while in hospital.  SLP provided a copy of his dysphagia precautions/mitigation strategies using teach back.    Chronic aspiration and aspiration risk present but mitigation strategies present.    Pt would benefit from RMST -to pt's comfort level given his rib fractures.   If MD agrees to allow SLP proceed with caution given his rib fractures.       ua HPI: 84 y.o. male  with past medical history of back surgery s/p complications with discitis and osteomyelitis requiring long-term antibiotics, dysphagia, falls, hiatal hernia, HTN, DVT, esophageal stricture, and asthma admitted on 09/20/2017 with right chest pain, cough and congestion. About two weeks ago, the patient suffered from a fall when transferring from motorized scooter to bed. Visited with neurosurgeon  who imaged his back. Per son, no mention of rib fractures at that time. Continued to have right-sided chest pain despite oxycodone. In ED, chest xray revealed multiple right rib fractures, right upper lobe pneumonia, and large left pleural effusion with midlung airspace opacities which could represent pneumonia. CT chest revealed moderate bilateral pleural effusions. FULL code. Palliative medicine consultation for goals of care.      SLP Plan  Continue with current plan of care       Recommendations  Diet recommendations: Dysphagia 1 (puree);Thin liquid Liquids provided via: Straw Medication Administration: Whole meds with puree Supervision: Patient able to self feed Compensations: Minimize environmental distractions;Slow rate;Small sips/bites;Hard cough after swallow;Clear throat after each swallow;Multiple dry swallows after each bite/sip Postural Changes and/or Swallow Maneuvers: Seated upright 90 degrees;Upright 30-60 min after meal                Oral Care Recommendations: Oral care QID Follow up Recommendations: 24 hour supervision/assistance SLP Visit Diagnosis: Dysphagia, oropharyngeal phase (R13.12) Plan: Continue with current plan of care       GO                Macario Golds 09/23/2017, 2:08 PM  Luanna Salk, [facility] SLP [phone]

## 2017-09-23 NOTE — Care Management Important Message (Signed)
Important Message  Patient Details  Name: ARIHANT PENNINGS MRN: [id number] Date of Birth: 1930-12-06   Medicare Important Message Given:  Yes    Kerin Salen 09/23/2017, 10:44 AMImportant Message  Patient Details  Name: KERIM STATZER MRN: [id number] Date of Birth: [birth year]   Medicare Important Message Given:  Yes    Kerin Salen 09/23/2017, 10:44 AM

## 2017-09-23 NOTE — Progress Notes (Signed)
Daily Progress Note   Patient Name: Derrick Sanchez       Date: 09/23/2017 DOB: 10-19-1930  Age: 84 y.o. MRN#: [id number] Attending Physician: Roxan Hockey, MD Primary Care Physician: [name], MD Admit Date: 09/20/2017  Reason for Consultation/Follow-up: Establishing goals of care  Subjective: Patient awake, alert, and oriented to person/place. Eating breakfast when I arrived to room. Denies pain or discomfort. Tells me he slept through the night.  GOC:  Son, [name] at bedside. Introduced Palliative Medicine as specialized medical care for people living with serious illness. It focuses on providing relief from the symptoms and stress of a serious illness. The goal is to improve quality of life for both the patient and the family. Patient minimally participates in conversation and intermittently closing eyes to rest.   Again, recapped a brief life review and events leading up to hospitalization. Married to wife for 18 years. [name] is their only son. Back surgery about four years ago lead to many complications including vertebral osteomyelitis/discitis requiring ID evaluation and chronic antibiotics. [name] shares that this has greatly contributed to his father's deconditioning to the point where he was afraid to walk with fear of falls and becoming chair bound. Cognitively intact. Nutritional status good prior to hospitalization.  He again shares the importance of keeping his father at home as long as possible (and out of a nursing facility). [name] pays out of pocket for caregivers 9 hours a day.   Discussed hospital diagnoses, interventions, and underlying co-morbidities in detail. Discussed SLP evaluation and high risk for aspiration risk requiring dysphagia 1 diet. Also concern with continued  aspiration risk leading to recurrent pneumonia. Explained my concern that he is requiring more oxygen to maintain oxygen saturation about 90%--risk for decompensation.   Advanced directives, concepts specific to code status, and artifical feeding and hydration were considered and discussed. [name] tells me his father has a 'generic' living will. Copy requested. [name] is [place]. He speaks of wanting "everything done" to get him home with is wife.   We discussed MOST form in detail including heroic interventions that may give his dad quantity but not quality of life. Explained what a 'code blue' entails. Answered many questions and concerns regarding resuscitation, life support, feeding tubes for son. [name] states 'when do we draw the line?' Educated on medical recommendation  for DNR/DNI with age, frailty, and underlying co-morbidities. Discussed risk for continued aspiration with feeding tubes.  Encouraged [name] read and review MOST form and Hard Choices booklet. Rick requests more time to process our conversation. I encouraged him to discuss wishes with his father, since he is awake and alert enough to tell us his wishes. Rick plans to further discuss this with his father tonight/tomorrow morning.   Also briefly discussed comfort focused care when patient's/families opt against heroic measures at EOL. Educated on goal of comfort, quality, dignity at EOL including importance of utilizing medications for symptom management.   [name] is interested in discharging to SNF to attempt rehab after hospitalization. Hopeful to get him back home with caregivers after.   [name] is very Patent attorney of our conversation. PMT contact information given.    Length of Stay: 3  Current Medications: Scheduled Meds:  . B-complex with vitamin C  1 tablet Oral Daily  . docusate sodium  100 mg Oral BID  . gabapentin  300 mg Oral Q1200  . gabapentin  600 mg Oral BID AC & HS  . heparin  5,000 Units Subcutaneous Q8H  .  latanoprost  1 drop Both Eyes QHS  . pantoprazole  40 mg Oral BID  . pramipexole  2 mg Oral TID    Continuous Infusions: . piperacillin-tazobactam (ZOSYN)  IV 3.375 g (09/23/17 0859)    PRN Meds: acetaminophen **OR** acetaminophen, ondansetron **OR** ondansetron (ZOFRAN) IV, oxyCODONE-acetaminophen  Physical Exam  Constitutional: He is cooperative. He appears ill.  HENT:  Head: Normocephalic and atraumatic.  Cardiovascular: Regular rhythm.  Pulmonary/Chest: No accessory muscle usage. No tachypnea. No respiratory distress.  5L [place]  Neurological: He is alert.  Oriented to person/place  Skin: Skin is warm and dry. There is pallor.  Psychiatric: He has a normal mood and affect. His speech is normal and behavior is normal.  Nursing note and vitals reviewed.          Vital Signs: BP (!) 109/55 (BP Location: Left Arm)   Pulse (!) 59   Temp 97.9 F (36.6 C) (Oral)   Resp (!) 21   Ht 6\' 2"  (1.88 m)   Wt 54.4 kg (120 lb)   SpO2 91%   BMI 15.41 kg/m  SpO2: SpO2: 91 % O2 Device: O2 Device: Nasal Cannula O2 Flow Rate: O2 Flow Rate (L/min): 5 L/min  Intake/output summary:   Intake/Output Summary (Last 24 hours) at 09/23/2017 3154 Last data filed at 09/23/2017 0810 Gross per 24 hour  Intake 1902.5 ml  Output -  Net 1902.5 ml   LBM: Last BM Date: 09/17/17 Baseline Weight: Weight: 54.4 kg (120 lb) Most recent weight: Weight: 54.4 kg (120 lb)       Palliative Assessment/Data: PPS 40%   Flowsheet Rows     Most Recent Value  Intake Tab  Referral Department  Hospitalist  Unit at Time of Referral  Med/Surg Unit  Palliative Care Primary Diagnosis  Pulmonary  Date Notified  09/20/17  Palliative Care Type  New Palliative care  Reason for referral  Clarify Goals of Care  Date of Admission  09/20/17  # of days IP prior to Palliative referral  0  Clinical Assessment  Psychosocial & Spiritual Assessment  Palliative Care Outcomes      Patient Active Problem List   Diagnosis  Date Noted  . Pressure injury of skin 09/22/2017  . Multiple closed fractures of ribs of right side   . Pleural effusion   . Palliative  care by specialist   . CAP (community acquired pneumonia) 09/20/2017  . Acute on chronic respiratory failure with hypoxia ([place]) 09/20/2017  . Malnutrition ([place]) 09/20/2017  . FTT (failure to thrive) in adult 09/20/2017  . PVD (peripheral vascular disease) ([place]) 08/04/2016  . Goals of care, counseling/discussion 08/04/2016  . Urinary hesitancy 11/26/2015  . Essential hypertension 01/05/2015  . Lumbar radiculopathy 11/23/2014  . Spinal epidural abscess 03/12/2014  . Protein-calorie malnutrition, severe ([place]) 03/01/2014  . Frequent falls 02/27/2014  . Trochanteric fracture ([place]) 02/27/2014  . Lumbar stenosis with neurogenic claudication 02/06/2014  . Stiffness of joints, not elsewhere classified, multiple sites 12/22/2012  . Postural imbalance 12/22/2012  . Hx of falling 12/22/2012  . HYPERPARATHYROIDISM UNSPECIFIED 05/03/2010  . OBSTRUCTIVE SLEEP APNEA 05/03/2010  . GLAUCOMA 05/03/2010  . ASTHMA 05/03/2010  . ANEMIA, VITAMIN B12 DEFICIENCY 06/14/2007  . RESTLESS LEG SYNDROME 06/14/2007  . GASTROESOPHAGEAL REFLUX DISEASE 06/14/2007  . DIVERTICULOSIS, COLON 05/31/2007  . POLYP, COLON 05/27/2002    Palliative Care Assessment & Plan   Patient Profile: 84 y.o. male  with past medical history of back surgery s/p complications with discitis and osteomyelitis requiring long-term antibiotics, dysphagia, falls, hiatal hernia, HTN, DVT, esophageal stricture, and asthma admitted on 09/20/2017 with right chest pain, cough and congestion. About two weeks ago, the patient suffered from a fall when transferring from motorized scooter to bed. Visited with neurosurgeon who imaged his back. Per son, no mention of rib fractures at that time. Continued to have right-sided chest pain despite oxycodone. In ED, chest xray revealed multiple right rib fractures, right upper  lobe pneumonia, and large left pleural effusion with midlung airspace opacities which could represent pneumonia. CT chest revealed moderate bilateral pleural effusions. FULL code. Palliative medicine consultation for goals of care.    Assessment: Acute respiratory failure with hypoxia Community-acquired pneumonia Bilateral pleural effusions High risk for aspiration Hx of vertebral osteomyelitis/discitis  Lower extremity wounds Deconditioning Malnutrition  Recommendations/Plan:  Detailed conversation with son regarding goals of care, MOST form and limitations of care including recommendation for DNR/DNI with age, frailty, and underlying co-morbidities. Son requests time to process our conversation and further discuss with his father.   Continue FULL code/FULL scope for now.   Thoracentesis if necessary per attending  PMT will follow up with patient/son in AM and complete MOST form if ready.  Goals of Care and Additional Recommendations:  Limitations on Scope of Treatment: Full Scope Treatment  Code Status: FULL   Code Status Orders  (From admission, onward)        Start     Ordered   09/20/17 1704  Full code  Continuous     09/20/17 1703    Code Status History    Date Active Date Inactive Code Status Order ID Comments User Context   11/23/2014 1053 11/24/2014 1257 Full Code [id number]  Jovita Gamma, MD Inpatient   10/16/2014 1014 10/17/2014 0330 Full Code [id number]  Marybelle Killings, MD [facility]   03/12/2014 2043 03/16/2014 1751 Full Code [id number]  Elaina Hoops, MD Inpatient   02/27/2014 1235 03/03/2014 2024 Full Code [id number]  Cristal Ford, DO Inpatient   02/06/2014 1931 02/08/2014 1419 Full Code [id number]  Hosie Spangle, MD Inpatient       Prognosis:   Unable to determine  Discharge Planning:  To Be Determined  Care plan was discussed with patient, son, Dr. Denton Brick  Thank you for allowing the Palliative Medicine Team to assist in the care of this patient.  Time  In: 0800 Time Out: 0925 Total Time 64min Prolonged Time Billed yes      Greater than 50%  of this time was spent counseling and coordinating care related to the above assessment and plan.  Ihor Dow, FNP-C Palliative Medicine Team  Phone: [phone] Fax: [phone]  Please contact Palliative Medicine Team phone at [phone] for questions and concerns.

## 2017-09-23 NOTE — Progress Notes (Signed)
PHARMACY NOTE:  Pharmacy has been assisting with dosing of Zosyn for aspiration pneumonia. Dosage remains stable at Zosyn 3.375g IV q8h (each dose infused over 4 hours) and need for further dosage adjustment appears unlikely at present.    Will sign off at this time.  Please reconsult if a change in clinical status warrants re-evaluation of dosage.   Lindell Spar, PharmD, BCPS Pager: [phone] 09/23/2017 11:23 AM

## 2017-09-23 NOTE — Progress Notes (Addendum)
Initial Nutrition Assessment  DOCUMENTATION CODES:   Underweight, Severe malnutrition in context of chronic illness  INTERVENTION:    Ensure Enlive po BID, each supplement provides 350 kcal and 20 grams of protein  Magic cup TID with meals, each supplement provides 290 kcal and 9 grams of protein  NUTRITION DIAGNOSIS:   Severe Malnutrition related to chronic illness, dysphagia as evidenced by severe fat depletion, severe muscle depletion, moderate fat depletion.  GOAL:   Patient will meet greater than or equal to 90% of their needs  MONITOR:   PO intake, Supplement acceptance, Weight trends, Labs  REASON FOR ASSESSMENT:   Consult Assessment of nutrition requirement/status  ASSESSMENT:   Patient with PMH significant for dysphagia, discitis, and osteomyelitis. Presents this admission after a mechanical fall transferring from motorized scooter to his bed. Admitted for CAP and multiple right rib fractures.    Spoke with son at bedside. States pt's appetite is usually stable (snacking consistently throughout the day) but has declined drastically over the last week. Pt has a history of swallowing difficulty which is present this admission. SLP saw pt 4/9 and recommends pureed food with thin liquids, but pt is still has severe risk for aspiration. Son reports pt may not be willing to comply with the pureed diet, son is aware of the risk. Discussed the importance of protein intake for preservation of lean body mass and how to increase protein/calories in a pureed diet. Suggested using oral nutrition supplements if appetite remains poor. RD to order Ensure/Magic cup for pt to try.   Son unsure of pt's UBW. Records show a general decrease in weight, but all entries look to be stated. Will monitor trend this admission. Nutrition-Focused physical exam completed. Suspect bilateral lower extremity depletions are a result of immobility as pt uses motorized scooter at home.   Medications  reviewed and include: B complex with Vit C, colace, IV abx Labs reviewed: BUN 25 (H)  NUTRITION - FOCUSED PHYSICAL EXAM:    Most Recent Value  Orbital Region  Moderate depletion  Upper Arm Region  Severe depletion  Thoracic and Lumbar Region  Unable to assess  Buccal Region  Moderate depletion  Temple Region  Severe depletion  Clavicle Bone Region  Severe depletion  Clavicle and Acromion Bone Region  Severe depletion  Scapular Bone Region  Unable to assess  Dorsal Hand  Moderate depletion  Patellar Region  Severe depletion  Anterior Thigh Region  Severe depletion  Posterior Calf Region  Severe depletion  Edema (RD Assessment)  None  Hair  Reviewed  Eyes  Reviewed  Mouth  Reviewed  Skin  Reviewed  Nails  Reviewed     Diet Order:  DIET - DYS 1 Room service appropriate? Yes; Fluid consistency: Thin  EDUCATION NEEDS:   Education needs have been addressed  Skin:  Skin Assessment: Skin Integrity Issues: Skin Integrity Issues:: Stage I, Unstageable Stage I: right hip Unstageable: left hip, left back  Last BM:  09/17/17  Height:   Ht Readings from Last 1 Encounters:  09/20/17 6\' 2"  (1.88 m)    Weight:   Wt Readings from Last 1 Encounters:  09/20/17 120 lb (54.4 kg)    Ideal Body Weight:  86.4 kg  BMI:  Body mass index is 15.41 kg/m.  Estimated Nutritional Needs:   Kcal:  2100-2300 kcal  Protein:  105-115 g  Fluid:  >2.1 L/day    Mariana Single RD, LDN Clinical Nutrition Pager # - [phone]

## 2017-09-23 NOTE — Progress Notes (Signed)
Oxygen saturation reading 88% on 4L O2. Pt is arousable, answers all orientation questions correctly. Moved patient to 5L O2. Sats maintaining 90-92% on 5L. Nightshift MD notified.

## 2017-09-24 ENCOUNTER — Inpatient Hospital Stay (HOSPITAL_COMMUNITY): Payer: PPO

## 2017-09-24 MED ORDER — AMOXICILLIN-POT CLAVULANATE 600-42.9 MG/5ML PO SUSR
875.0000 mg | Freq: Two times a day (BID) | ORAL | Status: DC
  Administered 2017-09-24 – 2017-09-25 (×2): 875 mg via ORAL
  Filled 2017-09-24 (×2): qty 7.3

## 2017-09-24 MED ORDER — FLORANEX PO PACK
1.0000 g | PACK | Freq: Three times a day (TID) | ORAL | Status: DC
  Administered 2017-09-24 – 2017-09-29 (×14): 1 g via ORAL
  Filled 2017-09-24 (×16): qty 1

## 2017-09-24 NOTE — Progress Notes (Signed)
Daily Progress Note   Patient Name: Derrick Sanchez       Date: 09/24/2017 DOB: [birth year]  Age: 84 y.o. MRN#: [id number] Attending Physician: Roxan Hockey, MD Primary Care Physician: [name], MD Admit Date: 09/20/2017  Reason for Consultation/Follow-up: Establishing goals of care  Subjective: Patient awake, alert, and oriented to person/place. Denies pain or discomfort. Son [name]) and I assisting with breakfast. Patient eats half of tray but with frequent coughing after he swallows.   GOC:  Follow-up with son, [name], from our conversation yesterday. He is waiting for chest xray results from this morning (still pending). The patient remains on 5L.  Again discussed hospital diagnoses, interventions, and underlying co-morbidities. Discussed aspiration risk and reviewed speech therapy recommendations. [name] is hopeful antibiotics will clear the infection. I again explained high risk of continued aspiration leading to another pneumonia.   [name] asks for me to stay and discuss plan of care/heroic interventions with his father. [name] sits down and asks his father thoughts regarding his wishes if his heart stopped. We explained resuscitation in detail and my concern with him not surviving this intervention with age, frailty, co-morbidities, and poor outcomes of CPR. Patient tells [place] he can handle the pain with broken ribs. [name] speaks of him having a very high pain tolerance. [name] also attempts to ask if he would want to be placed on life support if his respiratory status worsened. Patient states "I think I am going to get better" and "whatever you think is best."   After discussion, no decisions are made. It remains important for [name] to continue conversations with his father to help guide his  decision-making with heroic interventions. Encouraged him to continue to review MOST form and Hard Choices booklet.   [name] has PMT contact information and understands he can call with questions/concerns.   Therapeutic listening as patient shares stories.    Length of Stay: 4  Current Medications: Scheduled Meds:  . B-complex with vitamin C  1 tablet Oral Daily  . docusate sodium  100 mg Oral BID  . feeding supplement (ENSURE ENLIVE)  237 mL Oral BID BM  . gabapentin  300 mg Oral Q1200  . gabapentin  600 mg Oral BID AC & HS  . heparin  5,000 Units Subcutaneous Q8H  . latanoprost  1 drop Both Eyes  QHS  . pantoprazole  40 mg Oral BID  . pramipexole  2 mg Oral TID    Continuous Infusions: . piperacillin-tazobactam (ZOSYN)  IV 3.375 g (09/24/17 0058)    PRN Meds: acetaminophen **OR** acetaminophen, ondansetron **OR** ondansetron (ZOFRAN) IV, oxyCODONE-acetaminophen  Physical Exam  Constitutional: He is cooperative. He appears ill.  HENT:  Head: Normocephalic and atraumatic.  Cardiovascular: Regular rhythm.  Pulmonary/Chest: No accessory muscle usage. No tachypnea. No respiratory distress.  5L [place]  Neurological: He is alert.  Oriented to person/place  Skin: Skin is warm and dry. There is pallor.  Psychiatric: He has a normal mood and affect. His speech is normal and behavior is normal.  Nursing note and vitals reviewed.          Vital Signs: BP 101/61 (BP Location: Right Arm)   Pulse (!) 51   Temp (!) 97.5 F (36.4 C) (Oral)   Resp 18   Ht 6\' 2"  (1.88 m)   Wt 54.4 kg (120 lb)   SpO2 93%   BMI 15.41 kg/m  SpO2: SpO2: 93 % O2 Device: O2 Device: Nasal Cannula O2 Flow Rate: O2 Flow Rate (L/min): 5 L/min  Intake/output summary:   Intake/Output Summary (Last 24 hours) at 09/24/2017 0839 Last data filed at 09/24/2017 0543 Gross per 24 hour  Intake 320 ml  Output 1000 ml  Net -680 ml   LBM: Last BM Date: 09/23/17 Baseline Weight: Weight: 54.4 kg (120 lb) Most recent  weight: Weight: 54.4 kg (120 lb)       Palliative Assessment/Data: PPS 40%   Flowsheet Rows     Most Recent Value  Intake Tab  Referral Department  Hospitalist  Unit at Time of Referral  Med/Surg Unit  Palliative Care Primary Diagnosis  Pulmonary  Date Notified  09/20/17  Palliative Care Type  New Palliative care  Reason for referral  Clarify Goals of Care  Date of Admission  09/20/17  # of days IP prior to Palliative referral  0  Clinical Assessment  Psychosocial & Spiritual Assessment  Palliative Care Outcomes      Patient Active Problem List   Diagnosis Date Noted  . Hypoxia   . At high risk for aspiration   . Pressure injury of skin 09/22/2017  . Multiple closed fractures of ribs of right side   . Pleural effusion   . Palliative care by specialist   . CAP (community acquired pneumonia) 09/20/2017  . Acute on chronic respiratory failure with hypoxia ([place]) 09/20/2017  . Malnutrition ([place]) 09/20/2017  . FTT (failure to thrive) in adult 09/20/2017  . PVD (peripheral vascular disease) ([place]) 08/04/2016  . Goals of care, counseling/discussion 08/04/2016  . Urinary hesitancy 11/26/2015  . Essential hypertension 01/05/2015  . Lumbar radiculopathy 11/23/2014  . Spinal epidural abscess 03/12/2014  . Protein-calorie malnutrition, severe ([place]) 03/01/2014  . Frequent falls 02/27/2014  . Trochanteric fracture ([place]) 02/27/2014  . Lumbar stenosis with neurogenic claudication 02/06/2014  . Stiffness of joints, not elsewhere classified, multiple sites 12/22/2012  . Postural imbalance 12/22/2012  . Hx of falling 12/22/2012  . HYPERPARATHYROIDISM UNSPECIFIED 05/03/2010  . OBSTRUCTIVE SLEEP APNEA 05/03/2010  . GLAUCOMA 05/03/2010  . ASTHMA 05/03/2010  . ANEMIA, VITAMIN B12 DEFICIENCY 06/14/2007  . RESTLESS LEG SYNDROME 06/14/2007  . GASTROESOPHAGEAL REFLUX DISEASE 06/14/2007  . DIVERTICULOSIS, COLON 05/31/2007  . POLYP, COLON 05/27/2002    Palliative Care Assessment & Plan     Patient Profile: 84 y.o. male  with past medical history of back surgery  s/p complications with discitis and osteomyelitis requiring long-term antibiotics, dysphagia, falls, hiatal hernia, HTN, DVT, esophageal stricture, and asthma admitted on 09/20/2017 with right chest pain, cough and congestion. About two weeks ago, the patient suffered from a fall when transferring from motorized scooter to bed. Visited with neurosurgeon who imaged his back. Per son, no mention of rib fractures at that time. Continued to have right-sided chest pain despite oxycodone. In ED, chest xray revealed multiple right rib fractures, right upper lobe pneumonia, and large left pleural effusion with midlung airspace opacities which could represent pneumonia. CT chest revealed moderate bilateral pleural effusions. FULL code. Palliative medicine consultation for goals of care.    Assessment: Acute respiratory failure with hypoxia Community-acquired pneumonia Bilateral pleural effusions High risk for aspiration Hx of vertebral osteomyelitis/discitis  Lower extremity wounds Deconditioning Malnutrition  Recommendations/Plan:  I have discussed in detail hospital diagnoses, interventions, risk for aspiration, and underlying co-morbidities with patient and son.   Again discussed MOST form with patient/son. It remains important for son to include the patient with decision making regarding his wishes. No decisions have been made regarding limitations to care.   Continue FULL code/FULL scope.   Encouraged son to continue conversations with the patient.   Will need ongoing discussions regarding goals of care.   Goals of Care and Additional Recommendations:  Limitations on Scope of Treatment: Full Scope Treatment  Code Status: FULL   Code Status Orders  (From admission, onward)        Start     Ordered   09/20/17 1704  Full code  Continuous     09/20/17 1703    Code Status History    Date Active Date Inactive  Code Status Order ID Comments User Context   11/23/2014 1053 11/24/2014 1257 Full Code [id number]  Jovita Gamma, MD Inpatient   10/16/2014 1014 10/17/2014 0330 Full Code [id number]  Marybelle Killings, MD [facility]   03/12/2014 2043 03/16/2014 1751 Full Code [id number]  Elaina Hoops, MD Inpatient   02/27/2014 1235 03/03/2014 2024 Full Code [id number]  Cristal Ford, DO Inpatient   02/06/2014 1931 02/08/2014 1419 Full Code [id number]  Hosie Spangle, MD Inpatient       Prognosis:   Unable to determine  Discharge Planning:  To Be Determined  Care plan was discussed with patient, son, Dr. Denton Brick  Thank you for allowing the Palliative Medicine Team to assist in the care of this patient.   Time In: 0825 Time Out: 0925 Total Time 59min Prolonged Time Billed no      Greater than 50%  of this time was spent counseling and coordinating care related to the above assessment and plan.  Ihor Dow, FNP-C Palliative Medicine Team  Phone: [phone] Fax: [phone]  Please contact Palliative Medicine Team phone at [phone] for questions and concerns.

## 2017-09-24 NOTE — Clinical Social Work Note (Signed)
Clinical Social Work Assessment  Patient Details  Name: Derrick Sanchez MRN: [id number] Date of Birth: Oct 18, 1930  Date of referral:  09/24/17               Reason for consult:  Facility Placement                Permission sought to share information with:  Family Supports Permission granted to share information::  Yes, Verbal Permission Granted  Name::     Engineer, maintenance (IT)::     Relationship::  Son  Sport and exercise psychologist Information:     Housing/Transportation Living arrangements for the past 2 months:  Single Family Home Source of Information:  Patient Patient Interpreter Needed:  None Criminal Activity/Legal Involvement Pertinent to Current Situation/Hospitalization:  No - Comment as needed Significant Relationships:  Warehouse manager, Spouse, Adult Children Lives with:  Spouse Do you feel safe going back to the place where you live?  Yes Need for family participation in patient care:  Yes (Comment)  Care giving concerns:  Patient lives at home with his spouse. Patient has caregivers from [place] until 11pm.    Social Worker assessment / plan: LCSW consulted for SNF placement.   Patient from home. Patient admitted for CAP.  LCSW met at bedside with patient. No family present. Patient is oriented. LCSW explained role and reason for visit.   Patient reports that he lives at home with his spouse. Patient states that he has caregivers in the home from 8am-11pm. Patient states he uses a motorized wheel chair at home and is able to transfer at baseline.   LCSW attempted to reach patients son, [name] for collateral.   OT recommends SNF at dc. PT has not evaluated patient at the time of assessment.   Patient states he would like to go home at dc.   PLAN: TBD    Employment status:  Retired Nurse, adult PT Recommendations:  Not assessed at this time Information / Referral to community resources:     Patient/Family's Response to care:  Patient is thankful for LCSW visit.    Patient/Family's Understanding of and Emotional Response to Diagnosis, Current Treatment, and Prognosis:  LCSW unable to assess patients understanding of diagnosis. Patient prefers to go home at dc.   Emotional Assessment Appearance:  Appears older than stated age Attitude/Demeanor/Rapport:    Affect (typically observed):  Accepting, Calm Orientation:  Oriented to Place, Oriented to Self, Oriented to  Time, Oriented to Situation Alcohol / Substance use:  Not Applicable Psych involvement (Current and /or in the community):  No (Comment)  Discharge Needs  Concerns to be addressed:  No discharge needs identified Readmission within the last 30 days:  No Current discharge risk:  None Barriers to Discharge:  Continued Medical Work up   [employer], LCSW 09/24/2017, 3:24 PM

## 2017-09-24 NOTE — Evaluation (Signed)
Occupational Therapy Evaluation Patient Details Name: Derrick Sanchez MRN: [id number] DOB: November 16, 1930 Today's Date: 09/24/2017    History of Present Illness Derrick Sanchez is a 84 y.o. male with history significant for dysphagia, discitis and osteomyelitis requiring long-term antibiotics now on daily suppression with Levaquin who 2 weeks ago suffered a fall while transferring himself from his motorized scooter into the bed.  He fell on his right side and had immediate right-sided chest pain.  Sometime thereafter he visited Dr. Carloyn Manner who is his neurosurgeon who imaged his back. Son states there was no mention of rib fractures at that time.  He presented because that right-sided chest pain has continued despite oxycodone. He developed a cough productive of yellow sputum and congestion.  In the ED he is found to have on chest x-ray multiple rib fractures on the right, right upper lobe pneumonia as well as a large left pleural effusion with midlung airspace opacities which could represent pneumonia.  Initial blood pressure was a little low with systolics in the 25Z but has responded well to some IV fluids, he is not febrile, blood work is essentially unremarkable, of note WBC count is 6.3 and lactic acid is normal at 1.8.  Patient was hospitalized for further management.  CT scan of the chest was done which showed moderate bilateral pleural effusion.     Clinical Impression   Pt admitted with CAP. Pt currently with functional limitations due to the deficits listed below (see OT Problem List).  Pt will benefit from skilled OT to increase their safety and independence with ADL and functional mobility for ADL to facilitate discharge to venue listed below.      Follow Up Recommendations  SNF          Precautions / Restrictions Precautions Precautions: Fall      Mobility Bed Mobility Overal bed mobility: Needs Assistance Bed Mobility: Supine to Sit;Sit to Supine     Supine to sit: Total assist;HOB  elevated Sit to supine: Total assist      Transfers          would need Hoyer            Balance Overall balance assessment: Needs assistance Sitting-balance support: Bilateral upper extremity supported Sitting balance-Leahy Scale: Poor                                     ADL either performed or assessed with clinical judgement   ADL Overall ADL's : Needs assistance/impaired Eating/Feeding: Maximal assistance;Bed level   Grooming: Maximal assistance;Bed level                                 General ADL Comments: Pt did sit EOB with OT for 5 min with max A     Vision Patient Visual Report: No change from baseline       Perception     Praxis      Pertinent Vitals/Pain Faces Pain Scale: Hurts a little bit Pain Location: back Pain Descriptors / Indicators: Discomfort Pain Intervention(s): Limited activity within patient's tolerance     Hand Dominance     Extremity/Trunk Assessment Upper Extremity Assessment Upper Extremity Assessment: Generalized weakness           Communication     Cognition Arousal/Alertness: Awake/alert Behavior During Therapy: WFL for tasks assessed/performed Overall Cognitive Status: Within  Functional Limits for tasks assessed                                                Home Living Family/patient expects to be discharged to:: Skilled nursing facility                                                 OT Problem List: Decreased strength;Decreased activity tolerance;Decreased knowledge of use of DME or AE;Impaired balance (sitting and/or standing)      OT Treatment/Interventions: Self-care/ADL training;Patient/family education;DME and/or AE instruction    OT Goals(Current goals can be found in the care plan section) Acute Rehab OT Goals Patient Stated Goal: did not state OT Goal Formulation: With patient Time For Goal Achievement: 10/08/17  OT  Frequency: Min 2X/week   Barriers to D/C: Decreased caregiver support             AM-PAC PT "6 Clicks" Daily Activity     Outcome Measure Help from another person eating meals?: A Lot Help from another person taking care of personal grooming?: A Lot Help from another person toileting, which includes using toliet, bedpan, or urinal?: Total Help from another person bathing (including washing, rinsing, drying)?: Total Help from another person to put on and taking off regular upper body clothing?: A Lot Help from another person to put on and taking off regular lower body clothing?: Total 6 Click Score: 9   End of Session Nurse Communication: Mobility status;Need for lift equipment  Activity Tolerance: Patient limited by fatigue Patient left: in bed;with call bell/phone within reach  OT Visit Diagnosis: Muscle weakness (generalized) (M62.81);History of falling (Z91.81)                Time: 9024-0973 OT Time Calculation (min): 32 min Charges:  OT General Charges $OT Visit: 1 Visit OT Evaluation $OT Eval Moderate Complexity: 1 Mod OT Treatments $Self Care/Home Management : 8-22 mins G-Codes:     Kari Baars, OT [phone]  Payton Mccallum D 09/24/2017, 2:11 PM

## 2017-09-24 NOTE — Progress Notes (Signed)
Patient Demographics:    Derrick Sanchez, is a 84 y.o. male, DOB - 11/11/1930, UMP:[id number]  Admit date - 09/20/2017   Admitting Physician Erline Hau, MD  Outpatient Primary MD for the patient is [name], MD  LOS - 4   Chief Complaint  Patient presents with  . Back Pain  . Cough        Subjective:    Derrick Sanchez today has no fevers, no emesis,  No chest pain,  Son at bedside, OT at bedside also, more awake and talkative  Assessment  & Plan :    Active Problems:   Goals of care, counseling/discussion   CAP (community acquired pneumonia)   Acute on chronic respiratory failure with hypoxia (HCC)   Malnutrition (HCC)   FTT (failure to thrive) in adult   Pressure injury of skin   Multiple closed fractures of ribs of right side   Pleural effusion   Palliative care by specialist   Hypoxia   At high risk for aspiration  Reason for Visit: Pneumonia.  Pleural effusion.   Brief History/Interval Summary: Derrick Sanchez is a 84 y.o. male with history significant for dysphagia, discitis and osteomyelitis requiring long-term antibiotics now on daily suppression with Levaquin who 2 weeks ago suffered a fall while transferring himself from his motorized scooter into the bed.  He fell on his right side and had immediate right-sided chest pain.  Sometime thereafter he visited Dr. Carloyn Manner who is his neurosurgeon who imaged his back. Son states there was no mention of rib fractures at that time.  He presented because that right-sided chest pain has continued despite oxycodone. He developed a cough productive of yellow sputum and congestion.  In the ED he is found to have on chest x-ray multiple rib fractures on the right, right upper lobe pneumonia as well as a large left pleural effusion with midlung airspace opacities which could represent pneumonia.  Initial blood pressure was a little low with  systolics in the 40G but has responded well to some IV fluids, he is not febrile, blood work is essentially unremarkable, of note WBC count is 6.3 and lactic acid is normal at 1.8.  Patient was hospitalized for further management.  CT scan of the chest was done which showed moderate bilateral pleural effusion.  Palliative medicine was consulted.    Plan:- 1)Community-acquired pneumonia with concern for aspiration as well/acute respiratory failure with hypoxia- Patient is on Levaquin chronically for history of discitis.  Patient started on Zosyn after discussions with Dr. Johnnye Sima.  Levaquin has been held for now.  Speech and swallow evaluation consult appreciated, High suspicion of aspiration, patient has had extensive imaging and ordered speech workup recently, continue modified diet  2)Bilateral pleural effusion--  Repeat Chest x-ray on 09/24/17 is slightly improved, patient still has left-sided pleural effusion ,    No history of heart disease.  BNP only minimally elevated.  echocardiogram with EF of 60-65%, oxygen requirement is better patient is down to 3 L via nasal cannula continuously,  discussed with patient's son at bedside, hold off on thoracentesis due to radiological and clinical improvement.  Patient's son request that we continue to hold anticoagulation for now just in case thoracentesis is needed  3)History of Left  lower extremity DVT Diagnosed in 07/2016 and was on Xarelto.  Hold for now in case patient needs to undergo thoracentesis.  4)History of vertebral osteomyelitis/discitis He is on chronic suppressive treatment with Levaquin.  Treated with Zosyn as recommended by Dr. Johnnye Sima, switched to  Augmentin on 09/24/17  5)Generalized weakness and deconditioning-PT eval appreciated ,  OT eval appreciated recommend skilled nursing facility for rehab  6)Lower extremity wounds- Wound care has been consulted.  7)Normocytic anemia Hemoglobin slightly low today compared to yesterday.   No evidence of overt bleeding.  Likely dilutional drop.   8)Goals of care-discussed with palliative care team who spoke with patient and son, had a long chat with patient and patient's son at bedside as well on 09/23/2017, patient remains a full code with full scope of treatment at this time   DVT Prophylaxis: On Xarelto at home which has been held for now    Code Status: Full code Family Communication: Discussed with patient and his son Disposition Plan:  SNF  Consultants: Palliative medicine  Procedures: None yet  Antibiotics: Zosyn stopped 09/24/17 Start Augmentin on 09/24/17    DVT Prophylaxis  : Xarelto is on hold, subcu heparin for now  Lab Results  Component Value Date   PLT 138 (L) 09/23/2017    Inpatient Medications  Scheduled Meds: . B-complex with vitamin C  1 tablet Oral Daily  . docusate sodium  100 mg Oral BID  . feeding supplement (ENSURE ENLIVE)  237 mL Oral BID BM  . gabapentin  300 mg Oral Q1200  . gabapentin  600 mg Oral BID AC & HS  . heparin  5,000 Units Subcutaneous Q8H  . latanoprost  1 drop Both Eyes QHS  . pantoprazole  40 mg Oral BID  . pramipexole  2 mg Oral TID   Continuous Infusions: . piperacillin-tazobactam (ZOSYN)  IV 3.375 g (09/24/17 0905)   PRN Meds:.acetaminophen **OR** acetaminophen, ondansetron **OR** ondansetron (ZOFRAN) IV, oxyCODONE-acetaminophen    Anti-infectives (From admission, onward)   Start     Dose/Rate Route Frequency Ordered Stop   09/20/17 1730  piperacillin-tazobactam (ZOSYN) IVPB 3.375 g     3.375 g 12.5 mL/hr over 240 Minutes Intravenous Every 8 hours 09/20/17 1719     09/20/17 1315  cefTRIAXone (ROCEPHIN) 1 g in sodium chloride 0.9 % 100 mL IVPB     1 g 200 mL/hr over 30 Minutes Intravenous  Once 09/20/17 1304 09/20/17 1402   09/20/17 1315  azithromycin (ZITHROMAX) 500 mg in sodium chloride 0.9 % 250 mL IVPB     500 mg 250 mL/hr over 60 Minutes Intravenous  Once 09/20/17 1304 09/20/17 1436         Objective:   Vitals:   09/23/17 0612 09/23/17 1424 09/23/17 2050 09/24/17 0540  BP: (!) 109/55 (!) 113/59 (!) 97/56 101/61  Pulse: (!) 59 (!) 58 (!) 56 (!) 51  Resp: (!) 21 18 18 18   Temp: 97.9 F (36.6 C) 97.7 F (36.5 C) 97.6 F (36.4 C) (!) 97.5 F (36.4 C)  TempSrc: Oral Oral Oral Oral  SpO2: 91% 96% 90% 93%  Weight:      Height:        Wt Readings from Last 3 Encounters:  09/20/17 54.4 kg (120 lb)  04/07/17 65.8 kg (145 lb)  07/23/16 68 kg (150 lb)     Intake/Output Summary (Last 24 hours) at 09/24/2017 1609 Last data filed at 09/24/2017 0543 Gross per 24 hour  Intake 150 ml  Output 1000 ml  Net -850 ml     Physical Exam  Gen:- Awake Alert, in no acute distress, chronically ill-appearing HEENT:- [place].AT, No sclera icterus Neck-Supple Neck,No JVD,.  Lungs-diminished in bases, no wheezing CV- S1, S2 normal,  Abd-  +ve B.Sounds, Abd Soft, No tenderness,    Extremity/Skin:- No  edema, good pulses Psych-affect is appropriate, some cognitive deficits  neuro-generalized weakness without new focal deficits,  no tremors   Data Review:   Micro Results Recent Results (from the past 240 hour(s))  Blood culture (routine x 2)     Status: None (Preliminary result)   Collection Time: 09/20/17  1:25 PM  Result Value Ref Range Status   Specimen Description LEFT ANTECUBITAL  Final   Special Requests   Final    BOTTLES DRAWN AEROBIC AND ANAEROBIC Blood Culture adequate volume   Culture   Final    NO GROWTH 4 DAYS Performed at [facility], [address] [place], [place] [address]    Report Status PENDING  Incomplete  Blood culture (routine x 2)     Status: None (Preliminary result)   Collection Time: 09/20/17  1:32 PM  Result Value Ref Range Status   Specimen Description BLOOD RIGHT HAND  Final   Special Requests   Final    BOTTLES DRAWN AEROBIC AND ANAEROBIC Blood Culture adequate volume   Culture   Final    NO GROWTH 4 DAYS Performed at [facility], [address], [place],  [address]    Report Status PENDING  Incomplete    Radiology Reports Dg Chest 2 View  Result Date: 09/24/2017 CLINICAL DATA:  Shortness of breath, weakness EXAM: CHEST - 2 VIEW COMPARISON:  09/20/2017; correlation interval CT chest 09/21/2017 FINDINGS: Enlargement of cardiac silhouette with vascular congestion. Atherosclerotic calcification aorta. Bibasilar effusions. Atelectasis versus consolidation LEFT lower lobe. Improved upper lobe infiltrates more so on LEFT with residual opacity in the periphery of the RIGHT upper lobe. Central peribronchial thickening. No pneumothorax. Bones demineralized. IMPRESSION: Bibasilar effusions and atelectasis. Bronchitic changes with improved upper lobe infiltrates. Electronically Signed   By: Lavonia Dana M.D.   On: 09/24/2017 09:15   Ct Chest W Contrast  Result Date: 09/21/2017 CLINICAL DATA:  The patient suffered a fall transferring from a motorized scooter to bed 2 weeks ago with a blow to the right side and onset of right chest pain. Initial encounter. EXAM: CT CHEST WITH CONTRAST TECHNIQUE: Multidetector CT imaging of the chest was performed during intravenous contrast administration. CONTRAST:  75 mL OMNIPAQUE IOHEXOL 300 MG/ML  SOLN COMPARISON:  Single-view of the chest 09/20/2016 and 10/19/2015. Plain films right shoulder 09/10/2017. CT chest 02/02/2014. FINDINGS: Cardiovascular: There is mild cardiomegaly. No pericardial effusion. Extensive calcific aortic and coronary atherosclerosis is identified. Large varix right subclavian vein noted. Mediastinum/Nodes: No axillary, hilar or mediastinal lymphadenopathy. Small to moderate hiatal hernia is noted. Lungs/Pleura: The patient has moderate bilateral pleural effusions. There is associated compressive atelectasis in the posterior aspects of both lower lobes. Patchy airspace disease is identified in the superior segment of the left lower lobe. Upper Abdomen: There is partial visualization of  large bilateral renal cysts which appear unchanged. No acute abnormality is identified. Musculoskeletal: The right shoulder is anteriorly dislocated. Marked anterior subluxation of the left shoulder is identified. The patient has severe cervical spondylosis with 0.5 cm anterolisthesis C5 on C6 and severe loss of disc space height at C5-6 and C6-7. The patient has multiple right rib fractures all of which  appear late subacute to remote with callus formation identified about the fractures. No acute fracture is seen. IMPRESSION: Anterior dislocation of the right shoulder as seen on comparison plain films 09/10/2017. Multiple remote right rib fractures. No acute right rib fracture is identified. Marked anterior subluxation of the left humeral head on the glenoid consistent with glenohumeral joint instability. Moderate bilateral pleural effusions. There is associated compressive atelectasis. Patchy airspace disease in the superior segment of the left lower lobe could be due to atelectasis but has an appearance worrisome for pneumonia. Extensive coronary atherosclerosis Advanced cervical spondylosis C5-6 and C6-7. Aortic Atherosclerosis (ICD10-I70.0). Electronically Signed   By: Inge Rise M.D.   On: 09/21/2017 15:49   Dg Chest Portable 1 View  Result Date: 09/20/2017 CLINICAL DATA:  Back pain.  Hypertension history of pneumonia. EXAM: PORTABLE CHEST 1 VIEW COMPARISON:  10/19/2015 FINDINGS: Obscuration of the left hemidiaphragm with left mid lung airspace opacity and potential left basilar airspace opacity. Suspected left pleural effusion with density peripherally in the left chest. The patient is rotated to the left on today's radiograph, reducing diagnostic sensitivity and specificity. Multiple healing right rib fractures. Peripheral interstitial accentuation in the right upper lobe. Difficult to assess the heart due to obscuration of cardiac margins and leftward rotation. Left glenohumeral joint appears  dislocated. Possible right medial basilar airspace opacity. IMPRESSION: 1. Marked leftward rotation reduces diagnostic sensitivity. 2. Large left pleural effusion with left basilar and mid lung airspace opacities which could be from pneumonia or atelectasis. 3. Peripheral confluent interstitial opacity in the right upper lobe could reflect atypical infectious process but is technically nonspecific. 4. Multiple interval rib fractures since the prior exam from 10/19/2015 on the right, with callus formation from healing response. 5. Dislocated right glenohumeral joint, as on recent shoulder radiographs. 6. Chest CT could be utilized to further characterize the abnormalities in the chest, if clinically warranted. Electronically Signed   By: Van Clines M.D.   On: 09/20/2017 13:07     CBC Recent Labs  Lab 09/20/17 1300 09/22/17 0553 09/23/17 0614  WBC 6.3 4.3 5.9  HGB 9.0* 8.2* 8.5*  HCT 29.3* 27.4* 28.2*  PLT 172 149* 138*  MCV 89.1 92.3 91.6  MCH 27.4 27.6 27.6  MCHC 30.7 29.9* 30.1  RDW 16.8* 17.3* 17.1*  LYMPHSABS 1.3  --   --   MONOABS 0.4  --   --   EOSABS 0.0  --   --   BASOSABS 0.0  --   --     Chemistries  Recent Labs  Lab 09/20/17 1300 09/22/17 0553 09/23/17 0614  NA 140 141 140  K 4.6 4.3 3.9  CL 110 114* 109  CO2 23 21* 25  GLUCOSE 100* 89 90  BUN 36* 25* 25*  CREATININE 1.15 1.04 1.03  CALCIUM 7.4* 7.3* 7.4*  AST 19  --   --   ALT 10*  --   --   ALKPHOS 87  --   --   BILITOT 0.6  --   --    ------------------------------------------------------------------------------------------------------------------ No results for input(s): CHOL, HDL, LDLCALC, TRIG, CHOLHDL, LDLDIRECT in the last 72 hours.  No results found for: HGBA1C ------------------------------------------------------------------------------------------------------------------ No results for input(s): TSH, T4TOTAL, T3FREE, THYROIDAB in the last 72 hours.  Invalid input(s):  FREET3 ------------------------------------------------------------------------------------------------------------------ No results for input(s): VITAMINB12, FOLATE, FERRITIN, TIBC, IRON, RETICCTPCT in the last 72 hours.  Coagulation profile No results for input(s): INR, PROTIME in the last 168 hours.  No results for input(s): DDIMER in  the last 72 hours.  Cardiac Enzymes Recent Labs  Lab 09/20/17 1300  TROPONINI <0.03   ------------------------------------------------------------------------------------------------------------------    Component Value Date/Time   BNP 241.4 (H) 09/22/2017 3343    Roxan Hockey M.D on 09/24/2017 at 4:09 PM  Between 7am to 7pm - Pager - [phone]  After 7pm go to [email or web address] - password [email or web address]  [facility] -  Office  [phone]   Voice Recognition Viviann Spare dictation system was used to create this note, attempts have been made to correct errors. Please contact the author with questions and/or clarifications.

## 2017-09-25 LAB — CULTURE, BLOOD (ROUTINE X 2)
CULTURE: NO GROWTH
Culture: NO GROWTH
SPECIAL REQUESTS: ADEQUATE
Special Requests: ADEQUATE

## 2017-09-25 MED ORDER — AMOXICILLIN-POT CLAVULANATE 400-57 MG/5ML PO SUSR
875.0000 mg | Freq: Two times a day (BID) | ORAL | Status: DC
  Administered 2017-09-25 – 2017-09-29 (×8): 875 mg via ORAL
  Filled 2017-09-25 (×11): qty 10.9

## 2017-09-25 NOTE — Progress Notes (Signed)
Occupational Therapy Treatment Patient Details Name: Derrick Sanchez MRN: [id number] DOB: 25-Oct-1930 Today's Date: 09/25/2017    History of present illness Derrick Sanchez is a 84 y.o. male with history significant for dysphagia, discitis and osteomyelitis requiring long-term antibiotics now on daily suppression with Levaquin who 2 weeks ago suffered a fall while transferring himself from his motorized scooter into the bed.  He fell on his right side and had immediate right-sided chest pain.  Sometime thereafter he visited Dr. Carloyn Manner who is his neurosurgeon who imaged his back. Son states there was no mention of rib fractures at that time.  He presented because that right-sided chest pain has continued despite oxycodone. He developed a cough productive of yellow sputum and congestion.  In the ED he is found to have on chest x-ray multiple rib fractures on the right, right upper lobe pneumonia as well as a large left pleural effusion with midlung airspace opacities which could represent pneumonia.  Initial blood pressure was a little low with systolics in the 40J but has responded well to some IV fluids, he is not febrile, blood work is essentially unremarkable, of note WBC count is 6.3 and lactic acid is normal at 1.8.  Patient was hospitalized for further management.  CT scan of the chest was done which showed moderate bilateral pleural effusion.     OT comments  Instructed on use of built up handle for self feeding as well as educated CNA   Follow Up Recommendations  SNF          Precautions / Restrictions Precautions Precautions: Fall       Mobility Bed Mobility      total A            T         ADL either performed or assessed with clinical judgement   ADL Overall ADL's : Needs assistance/impaired Eating/Feeding: Maximal assistance Eating/Feeding Details (indicate cue type and reason): provided built up handle for spoon and instructed in use. CNA will A pt at lunch to use   Grooming: Maximal assistance;Bed level                                 General ADL Comments: Instructed pt on use of built up handle for fork. Also focused on trying for pt to be more I using phone.  Pt not able to push buttons on hospital phone at this time.  Instructed to call CNa to A with phone call. Issued pt cream theraputty and instructed pt in use to increase fxal use of hands     Vision Patient Visual Report: No change from baseline            Cognition Arousal/Alertness: Awake/alert Behavior During Therapy: WFL for tasks assessed/performed Overall Cognitive Status: Within Functional Limits for tasks assessed                                                     Pertinent Vitals/ Pain       Faces Pain Scale: No hurt     Prior Functioning/Environment              Frequency  Min 2X/week        Progress Toward Goals  OT Goals(current goals can  now be found in the care plan section)  Progress towards OT goals: Progressing toward goals     Plan Discharge plan remains appropriate       AM-PAC PT "6 Clicks" Daily Activity     Outcome Measure   Help from another person eating meals?: A Lot Help from another person taking care of personal grooming?: A Lot Help from another person toileting, which includes using toliet, bedpan, or urinal?: Total Help from another person bathing (including washing, rinsing, drying)?: Total Help from another person to put on and taking off regular upper body clothing?: A Lot Help from another person to put on and taking off regular lower body clothing?: Total 6 Click Score: 9    End of Session    OT Visit Diagnosis: Muscle weakness (generalized) (M62.81);History of falling (Z91.81)   Activity Tolerance Patient tolerated treatment well   Patient Left in bed   Nurse Communication Mobility status;Need for lift equipment        Time: [phone] OT Time Calculation (min): 17  min  Charges: OT General Charges $OT Visit: 1 Visit OT Treatments $Self Care/Home Management : 8-22 mins  [place], [place]   Betsy Pries 09/25/2017, 10:42 AM

## 2017-09-25 NOTE — Progress Notes (Signed)
Patient Demographics:    [name], is a 84 y.o. male, DOB - 01-May-1931, TGG:[id number]  Admit date - 09/20/2017   Admitting Physician Erline Hau, MD  Outpatient Primary MD for the patient is [name], MD  LOS - 5   Chief Complaint  Patient presents with  . Back Pain  . Cough        Subjective:    Derrick Sanchez today has no fevers, no emesis,  No chest pain, oral intake is fair,  Assessment  & Plan :    Active Problems:   Goals of care, counseling/discussion   CAP (community acquired pneumonia)   Acute on chronic respiratory failure with hypoxia (HCC)   Malnutrition (HCC)   FTT (failure to thrive) in adult   Pressure injury of skin   Multiple closed fractures of ribs of right side   Pleural effusion   Palliative care by specialist   Hypoxia   At high risk for aspiration  Reason for Visit: Pneumonia.  Pleural effusion.   Brief History/Interval Summary: Derrick Sanchez is a 84 y.o. male with history significant for dysphagia, discitis and osteomyelitis requiring long-term antibiotics now on daily suppression with Levaquin who 2 weeks ago suffered a fall while transferring himself from his motorized scooter into the bed.  He fell on his right side and had immediate right-sided chest pain.  Sometime thereafter he visited Dr. Carloyn Manner who is his neurosurgeon who imaged his back. Son states there was no mention of rib fractures at that time.  He presented because that right-sided chest pain has continued despite oxycodone. He developed a cough productive of yellow sputum and congestion.  In the ED he is found to have on chest x-ray multiple rib fractures on the right, right upper lobe pneumonia as well as a large left pleural effusion with midlung airspace opacities which could represent pneumonia.  Initial blood pressure was a little low with systolics in the 70J but has responded well to  some IV fluids, he is not febrile, blood work is essentially unremarkable, of note WBC count is 6.3 and lactic acid is normal at 1.8.  Patient was hospitalized for further management.  CT scan of the chest was done which showed moderate bilateral pleural effusion.  Palliative medicine was consulted.    Plan:- 1)Community-acquired pneumonia with concern for aspiration as well/acute respiratory failure with hypoxia- Patient is on Levaquin chronically for history of discitis.  Patient started on Zosyn after discussions with Dr. Johnnye Sima.  Levaquin has been held for now.  Speech and swallow evaluation consult appreciated, High suspicion of aspiration, patient has had extensive imaging and ordered speech workup recently, continue modified diet  2)Bilateral pleural effusion--  Repeat Chest x-ray on 09/24/17 is slightly improved, patient still has left-sided pleural effusion ,    No history of heart disease.  BNP only minimally elevated.  echocardiogram with EF of 60-65%, oxygen requirement is better patient is down to 3 L via nasal cannula continuously,  discussed with patient's son at bedside, hold off on thoracentesis due to radiological and clinical improvement.  Patient's son request that we continue to hold anticoagulation for now just in case thoracentesis is needed, will repeat chest x-ray within the next day or 2,  currently down to 3 L of oxygen via nasal cannula  3)History of Left  lower extremity DVT Diagnosed in 07/2016 and was on Xarelto.  Hold Xarelto  for now in case patient needs to undergo thoracentesis.  4)History of vertebral osteomyelitis/discitis He is on chronic suppressive treatment with Levaquin.  Treated with Zosyn as recommended by Dr. Johnnye Sima, switched to  Augmentin on 09/24/17  5)Generalized weakness and deconditioning-PT eval appreciated ,  OT eval appreciated recommend skilled nursing facility for rehab  6)Lower extremity wounds- Wound care has been  consulted.  7)Normocytic anemia Hemoglobin slightly low today compared to yesterday.  No evidence of overt bleeding.  Likely dilutional drop.   8)Goals of care-discussed with palliative care team who spoke with patient and son, had a long chat with patient and patient's son at bedside as well on 09/23/2017, patient remains a full code with full scope of treatment at this time.  Plan of care discussed with patient's son [name]   DVT Prophylaxis: On Xarelto at home which has been held for now    Code Status: Full code Family Communication: Discussed with patient and his son Disposition Plan:  SNF  Consultants: Palliative medicine  Procedures: None yet  Antibiotics: Zosyn stopped 09/24/17 Start Augmentin on 09/24/17    DVT Prophylaxis  : Xarelto is on hold, subcu heparin for now  Lab Results  Component Value Date   PLT 138 (L) 09/23/2017    Inpatient Medications  Scheduled Meds: . amoxicillin-clavulanate  875 mg Oral Q12H  . B-complex with vitamin C  1 tablet Oral Daily  . docusate sodium  100 mg Oral BID  . feeding supplement (ENSURE ENLIVE)  237 mL Oral BID BM  . gabapentin  300 mg Oral Q1200  . gabapentin  600 mg Oral BID AC & HS  . heparin  5,000 Units Subcutaneous Q8H  . lactobacillus  1 g Oral TID WC  . latanoprost  1 drop Both Eyes QHS  . pantoprazole  40 mg Oral BID  . pramipexole  2 mg Oral TID   Continuous Infusions:  PRN Meds:.acetaminophen **OR** acetaminophen, ondansetron **OR** ondansetron (ZOFRAN) IV, oxyCODONE-acetaminophen    Anti-infectives (From admission, onward)   Start     Dose/Rate Route Frequency Ordered Stop   09/25/17 2200  amoxicillin-clavulanate (AUGMENTIN) 400-57 MG/5ML suspension 875 mg     875 mg Oral Every 12 hours 09/25/17 1438     09/24/17 2200  amoxicillin-clavulanate (AUGMENTIN) 600-42.9 MG/5ML suspension 875 mg  Status:  Discontinued     875 mg Oral 2 times daily 09/24/17 1612 09/25/17 1438   09/20/17 1730   piperacillin-tazobactam (ZOSYN) IVPB 3.375 g     3.375 g 12.5 mL/hr over 240 Minutes Intravenous Every 8 hours 09/20/17 1719 09/25/17 0129   09/20/17 1315  cefTRIAXone (ROCEPHIN) 1 g in sodium chloride 0.9 % 100 mL IVPB     1 g 200 mL/hr over 30 Minutes Intravenous  Once 09/20/17 1304 09/20/17 1402   09/20/17 1315  azithromycin (ZITHROMAX) 500 mg in sodium chloride 0.9 % 250 mL IVPB     500 mg 250 mL/hr over 60 Minutes Intravenous  Once 09/20/17 1304 09/20/17 1436        Objective:   Vitals:   09/24/17 1610 09/24/17 2124 09/25/17 0620 09/25/17 1335  BP: 124/66 129/73 122/72 114/69  Pulse: (!) 58 (!) 58 (!) 56 (!) 59  Resp:  19 15 14   Temp:  97.6 F (36.4 C)  97.6 F (36.4 C)  TempSrc:  Oral  Oral  SpO2: 92% 91% 92% (!) 89%  Weight:      Height:        Wt Readings from Last 3 Encounters:  09/20/17 54.4 kg (120 lb)  04/07/17 65.8 kg (145 lb)  07/23/16 68 kg (150 lb)     Intake/Output Summary (Last 24 hours) at 09/25/2017 1824 Last data filed at 09/25/2017 1712 Gross per 24 hour  Intake 360 ml  Output 400 ml  Net -40 ml     Physical Exam  Gen:- Awake Alert, in no acute distress, chronically ill-appearing HEENT:- [place].AT, No sclera icterus Neck-Supple Neck,No JVD,.  Nose- [place] 3 L/min Lungs-diminished in bases, no wheezing CV- S1, S2 normal,  Abd-  +ve B.Sounds, Abd Soft, No tenderness,    Extremity/Skin:- No  edema, good pulses Psych-affect is appropriate, some cognitive deficits  neuro-generalized weakness without new focal deficits,  no tremors   Data Review:   Micro Results Recent Results (from the past 240 hour(s))  Blood culture (routine x 2)     Status: None   Collection Time: 09/20/17  1:25 PM  Result Value Ref Range Status   Specimen Description LEFT ANTECUBITAL  Final   Special Requests   Final    BOTTLES DRAWN AEROBIC AND ANAEROBIC Blood Culture adequate volume   Culture   Final    NO GROWTH 5 DAYS Performed at [facility], [address],  [place], [place] [address]    Report Status 09/25/2017 FINAL  Final  Blood culture (routine x 2)     Status: None   Collection Time: 09/20/17  1:32 PM  Result Value Ref Range Status   Specimen Description BLOOD RIGHT HAND  Final   Special Requests   Final    BOTTLES DRAWN AEROBIC AND ANAEROBIC Blood Culture adequate volume   Culture   Final    NO GROWTH 5 DAYS Performed at [facility], [address] [place], [place] [address]    Report Status 09/25/2017 FINAL  Final    Radiology Reports Dg Chest 2 View  Result Date: 09/24/2017 CLINICAL DATA:  Shortness of breath, weakness EXAM: CHEST - 2 VIEW COMPARISON:  09/20/2017; correlation interval CT chest 09/21/2017 FINDINGS: Enlargement of cardiac silhouette with vascular congestion. Atherosclerotic calcification aorta. Bibasilar effusions. Atelectasis versus consolidation LEFT lower lobe. Improved upper lobe infiltrates more so on LEFT with residual opacity in the periphery of the RIGHT upper lobe. Central peribronchial thickening. No pneumothorax. Bones demineralized. IMPRESSION: Bibasilar effusions and atelectasis. Bronchitic changes with improved upper lobe infiltrates. Electronically Signed   By: Lavonia Dana M.D.   On: 09/24/2017 09:15   Ct Chest W Contrast  Result Date: 09/21/2017 CLINICAL DATA:  The patient suffered a fall transferring from a motorized scooter to bed 2 weeks ago with a blow to the right side and onset of right chest pain. Initial encounter. EXAM: CT CHEST WITH CONTRAST TECHNIQUE: Multidetector CT imaging of the chest was performed during intravenous contrast administration. CONTRAST:  75 mL OMNIPAQUE IOHEXOL 300 MG/ML  SOLN COMPARISON:  Single-view of the chest 09/20/2016 and 10/19/2015. Plain films right shoulder 09/10/2017. CT chest 02/02/2014. FINDINGS: Cardiovascular: There is mild cardiomegaly. No pericardial effusion. Extensive calcific aortic and coronary atherosclerosis is identified. Large varix right subclavian vein noted.  Mediastinum/Nodes: No axillary, hilar or mediastinal lymphadenopathy. Small to moderate hiatal hernia is noted. Lungs/Pleura: The patient has moderate bilateral pleural effusions. There is associated compressive atelectasis in the posterior aspects of both lower lobes. Patchy airspace disease is identified  in the superior segment of the left lower lobe. Upper Abdomen: There is partial visualization of large bilateral renal cysts which appear unchanged. No acute abnormality is identified. Musculoskeletal: The right shoulder is anteriorly dislocated. Marked anterior subluxation of the left shoulder is identified. The patient has severe cervical spondylosis with 0.5 cm anterolisthesis C5 on C6 and severe loss of disc space height at C5-6 and C6-7. The patient has multiple right rib fractures all of which appear late subacute to remote with callus formation identified about the fractures. No acute fracture is seen. IMPRESSION: Anterior dislocation of the right shoulder as seen on comparison plain films 09/10/2017. Multiple remote right rib fractures. No acute right rib fracture is identified. Marked anterior subluxation of the left humeral head on the glenoid consistent with glenohumeral joint instability. Moderate bilateral pleural effusions. There is associated compressive atelectasis. Patchy airspace disease in the superior segment of the left lower lobe could be due to atelectasis but has an appearance worrisome for pneumonia. Extensive coronary atherosclerosis Advanced cervical spondylosis C5-6 and C6-7. Aortic Atherosclerosis (ICD10-I70.0). Electronically Signed   By: Inge Rise M.D.   On: 09/21/2017 15:49   Dg Chest Portable 1 View  Result Date: 09/20/2017 CLINICAL DATA:  Back pain.  Hypertension history of pneumonia. EXAM: PORTABLE CHEST 1 VIEW COMPARISON:  10/19/2015 FINDINGS: Obscuration of the left hemidiaphragm with left mid lung airspace opacity and potential left basilar airspace opacity.  Suspected left pleural effusion with density peripherally in the left chest. The patient is rotated to the left on today's radiograph, reducing diagnostic sensitivity and specificity. Multiple healing right rib fractures. Peripheral interstitial accentuation in the right upper lobe. Difficult to assess the heart due to obscuration of cardiac margins and leftward rotation. Left glenohumeral joint appears dislocated. Possible right medial basilar airspace opacity. IMPRESSION: 1. Marked leftward rotation reduces diagnostic sensitivity. 2. Large left pleural effusion with left basilar and mid lung airspace opacities which could be from pneumonia or atelectasis. 3. Peripheral confluent interstitial opacity in the right upper lobe could reflect atypical infectious process but is technically nonspecific. 4. Multiple interval rib fractures since the prior exam from 10/19/2015 on the right, with callus formation from healing response. 5. Dislocated right glenohumeral joint, as on recent shoulder radiographs. 6. Chest CT could be utilized to further characterize the abnormalities in the chest, if clinically warranted. Electronically Signed   By: Van Clines M.D.   On: 09/20/2017 13:07     CBC Recent Labs  Lab 09/20/17 1300 09/22/17 0553 09/23/17 0614  WBC 6.3 4.3 5.9  HGB 9.0* 8.2* 8.5*  HCT 29.3* 27.4* 28.2*  PLT 172 149* 138*  MCV 89.1 92.3 91.6  MCH 27.4 27.6 27.6  MCHC 30.7 29.9* 30.1  RDW 16.8* 17.3* 17.1*  LYMPHSABS 1.3  --   --   MONOABS 0.4  --   --   EOSABS 0.0  --   --   BASOSABS 0.0  --   --     Chemistries  Recent Labs  Lab 09/20/17 1300 09/22/17 0553 09/23/17 0614  NA 140 141 140  K 4.6 4.3 3.9  CL 110 114* 109  CO2 23 21* 25  GLUCOSE 100* 89 90  BUN 36* 25* 25*  CREATININE 1.15 1.04 1.03  CALCIUM 7.4* 7.3* 7.4*  AST 19  --   --   ALT 10*  --   --   ALKPHOS 87  --   --   BILITOT 0.6  --   --     ------------------------------------------------------------------------------------------------------------------  No results for input(s): CHOL, HDL, LDLCALC, TRIG, CHOLHDL, LDLDIRECT in the last 72 hours.  No results found for: HGBA1C ------------------------------------------------------------------------------------------------------------------ No results for input(s): TSH, T4TOTAL, T3FREE, THYROIDAB in the last 72 hours.  Invalid input(s): FREET3 ------------------------------------------------------------------------------------------------------------------ No results for input(s): VITAMINB12, FOLATE, FERRITIN, TIBC, IRON, RETICCTPCT in the last 72 hours.  Coagulation profile No results for input(s): INR, PROTIME in the last 168 hours.  No results for input(s): DDIMER in the last 72 hours.  Cardiac Enzymes Recent Labs  Lab 09/20/17 1300  TROPONINI <0.03   ------------------------------------------------------------------------------------------------------------------    Component Value Date/Time   BNP 241.4 (H) 09/22/2017 6578    Roxan Hockey M.D on 09/25/2017 at 6:24 PM  Between 7am to 7pm - Pager - [phone]  After 7pm go to [email or web address] - password [email or web address]  [facility] -  Office  [phone]   Voice Recognition Viviann Spare dictation system was used to create this note, attempts have been made to correct errors. Please contact the author with questions and/or clarifications.

## 2017-09-25 NOTE — Evaluation (Signed)
Physical Therapy Evaluation Patient Details Name: Derrick Sanchez MRN: [id number] DOB: 1931-03-17 Today's Date: 09/25/2017   History of Present Illness  Derrick Sanchez is a 84 y.o. male with history significant for dysphagia, discitis and osteomyelitis who 2 weeks ago suffered a fall while transferring himself from his motorized scooter into the bed.   presented 09/20/17  right-sided chest pain , cough productive He is found to have  multiple rib fractures on the right, right uppr lobe pneumonia,  CT scan of the chest was done which showed moderate bilateral pleural effusion.    Clinical Impression  The patient presents with significant joint  deformities,  Bilateral legs contracted in flexion.  The patient was transferring self until fall. Uncertain if patient has been more bed bound since.Pt admitted with above diagnosis. Pt currently with functional limitations due to the deficits listed below (see PT Problem List).  Pt will benefit from skilled PT to increase their independence and safety with mobility to allow discharge to the venue listed below.       Follow Up Recommendations SNF    Equipment Recommendations  None recommended by PT    Recommendations for Other Services       Precautions / Restrictions Precautions Precautions: Fall      Mobility  Bed Mobility Overal bed mobility: Needs Assistance Bed Mobility: Rolling;Supine to Sit;Sit to Supine Rolling: Total assist;+2 for physical assistance   Supine to sit: Total assist;+2 for physical assistance;+2 for safety/equipment Sit to supine: +2 for physical assistance;Total assist;+2 for safety/equipment   General bed mobility comments: patient  really unable to assist, trunk and legs very flexed.  Transfers                    Ambulation/Gait                Stairs            Wheelchair Mobility    Modified Rankin (Stroke Patients Only)       Balance Overall balance assessment: Needs  assistance Sitting-balance support: Bilateral upper extremity supported Sitting balance-Leahy Scale: Poor                                       Pertinent Vitals/Pain Faces Pain Scale: Hurts even more Pain Location: back Pain Descriptors / Indicators: Discomfort Pain Intervention(s): Monitored during session    Home Living Family/patient expects to be discharged to:: Private residence Living Arrangements: Spouse/significant other Available Help at Discharge: Family;Personal care attendant Type of Home: House         Home Equipment: Transport planner      Prior Function Level of Independence: Needs assistance   Gait / Transfers Assistance Needed: wc dependent, transferred self until 2 weeks ago. may have been bed bound since           Hand Dominance        Extremity/Trunk Assessment   Upper Extremity Assessment Upper Extremity Assessment: Defer to OT evaluation    Lower Extremity Assessment Lower Extremity Assessment: RLE deficits/detail;LLE deficits/detail RLE Deficits / Details: knees flexed and locked at about 90 LLE Deficits / Details: same as right       Communication   Communication: No difficulties  Cognition Arousal/Alertness: Awake/alert Behavior During Therapy: WFL for tasks assessed/performed Overall Cognitive Status: Impaired/Different from baseline Area of Impairment: Orientation  Orientation Level: Disoriented to;Time                    General Comments      Exercises     Assessment/Plan    PT Assessment Patient needs continued PT services  PT Problem List Decreased strength;Decreased range of motion;Decreased knowledge of use of DME;Decreased activity tolerance;Decreased safety awareness;Decreased balance;Decreased knowledge of precautions;Decreased mobility;Decreased skin integrity       PT Treatment Interventions DME instruction;Functional mobility training;Therapeutic  activities;Patient/family education;Therapeutic exercise    PT Goals (Current goals can be found in the Care Plan section)  Acute Rehab PT Goals Patient Stated Goal: did not state PT Goal Formulation: With patient Time For Goal Achievement: 10/09/17 Potential to Achieve Goals: Fair    Frequency Min 2X/week   Barriers to discharge Decreased caregiver support      Co-evaluation               AM-PAC PT "6 Clicks" Daily Activity  Outcome Measure Difficulty turning over in bed (including adjusting bedclothes, sheets and blankets)?: Unable Difficulty moving from lying on back to sitting on the side of the bed? : Unable Difficulty sitting down on and standing up from a chair with arms (e.g., wheelchair, bedside commode, etc,.)?: Unable Help needed moving to and from a bed to chair (including a wheelchair)?: Total Help needed walking in hospital room?: Total Help needed climbing 3-5 steps with a railing? : Total 6 Click Score: 6    End of Session   Activity Tolerance: Patient tolerated treatment well Patient left: in bed;with call bell/phone within reach Nurse Communication: Mobility status PT Visit Diagnosis: History of falling (Z91.81);Pain Pain - Right/Left: Right    Time: 1400-1433 PT Time Calculation (min) (ACUTE ONLY): 33 min   Charges:   PT Evaluation $PT Eval Moderate Complexity: 1 Mod PT Treatments $Therapeutic Activity: 8-22 mins   PT G CodesTresa Endo PT [phone]  Claretha Cooper 09/25/2017, 2:49 PM

## 2017-09-26 LAB — BASIC METABOLIC PANEL
ANION GAP: 10 (ref 5–15)
BUN: 25 mg/dL — ABNORMAL HIGH (ref 6–20)
CO2: 22 mmol/L (ref 22–32)
Calcium: 7.7 mg/dL — ABNORMAL LOW (ref 8.9–10.3)
Chloride: 109 mmol/L (ref 101–111)
Creatinine, Ser: 0.83 mg/dL (ref 0.61–1.24)
GFR calc non Af Amer: >60 mL/min (ref >60)
GLUCOSE: 84 mg/dL (ref 65–99)
POTASSIUM: 3.7 mmol/L (ref 3.5–5.1)
Sodium: 141 mmol/L (ref 135–145)

## 2017-09-26 LAB — CBC
HEMATOCRIT: 31.2 % — AB (ref 39.0–52.0)
Hemoglobin: 9.4 g/dL — ABNORMAL LOW (ref 13.0–17.0)
MCH: 27.2 pg (ref 26.0–34.0)
MCHC: 30.1 g/dL (ref 30.0–36.0)
MCV: 90.2 fL (ref 78.0–100.0)
Platelets: 155 10*3/uL (ref 150–400)
RBC: 3.46 MIL/uL — AB (ref 4.22–5.81)
RDW: 17.4 % — ABNORMAL HIGH (ref 11.5–15.5)
WBC: 5.1 10*3/uL (ref 4.0–10.5)

## 2017-09-26 NOTE — NC FL2 (Addendum)
[place] LEVEL OF CARE SCREENING TOOL     IDENTIFICATION  Patient Name: Derrick Sanchez Birthdate: [birth year] Sex: male Admission Date (Current Location): 09/20/2017  [facility] and [place] Number:  Herbalist and Address:  [facility],  [place] [address] [place]      Provider Number: [id number]  Attending Physician Name and Address:  Roxan Hockey, MD  Relative Name and Phone Number:       Current Level of Care: Hospital Recommended Level of Care: [place] Prior Approval Number:    Date Approved/Denied: 09/26/17 PASRR Number: [id number]  Discharge Plan: SNF    Current Diagnoses: Patient Active Problem List   Diagnosis Date Noted  . Hypoxia   . At high risk for aspiration   . Pressure injury of skin 09/22/2017  . Multiple closed fractures of ribs of right side   . Pleural effusion   . Palliative care by specialist   . CAP (community acquired pneumonia) 09/20/2017  . Acute on chronic respiratory failure with hypoxia ([place]) 09/20/2017  . Malnutrition ([place]) 09/20/2017  . FTT (failure to thrive) in adult 09/20/2017  . PVD (peripheral vascular disease) ([place]) 08/04/2016  . Goals of care, counseling/discussion 08/04/2016  . Urinary hesitancy 11/26/2015  . Essential hypertension 01/05/2015  . Lumbar radiculopathy 11/23/2014  . Spinal epidural abscess 03/12/2014  . Protein-calorie malnutrition, severe ([place]) 03/01/2014  . Frequent falls 02/27/2014  . Trochanteric fracture ([place]) 02/27/2014  . Lumbar stenosis with neurogenic claudication 02/06/2014  . Stiffness of joints, not elsewhere classified, multiple sites 12/22/2012  . Postural imbalance 12/22/2012  . Hx of falling 12/22/2012  . HYPERPARATHYROIDISM UNSPECIFIED 05/03/2010  . OBSTRUCTIVE SLEEP APNEA 05/03/2010  . GLAUCOMA 05/03/2010  . ASTHMA 05/03/2010  . ANEMIA, VITAMIN B12 DEFICIENCY 06/14/2007  . RESTLESS LEG SYNDROME 06/14/2007  . GASTROESOPHAGEAL  REFLUX DISEASE 06/14/2007  . DIVERTICULOSIS, COLON 05/31/2007  . POLYP, COLON 05/27/2002    Orientation RESPIRATION BLADDER Height & Weight     Time, Situation, Place, Self  Normal External catheter Weight: 120 lb (54.4 kg) Height:  6\' 2"  (188 cm)  BEHAVIORAL SYMPTOMS/MOOD NEUROLOGICAL BOWEL NUTRITION STATUS      Continent Diet(See dc summary)  AMBULATORY STATUS COMMUNICATION OF NEEDS Skin   Extensive Assist(Wheelchair needs assistance with transfers) Verbally PU Stage and Appropriate Care(Pressure injury left hip, left back, Ulcer left leg) PU Stage 1 Dressing: (Right hip, foam dressing)                     Personal Care Assistance Level of Assistance  Bathing, Feeding, Dressing Bathing Assistance: Maximum assistance Feeding assistance: Independent Dressing Assistance: Maximum assistance     Functional Limitations Info  Sight, Hearing, Speech Sight Info: Impaired Hearing Info: Impaired Speech Info: Adequate    SPECIAL CARE FACTORS FREQUENCY  PT (By licensed PT), OT (By licensed OT)     PT Frequency: 5x/week OT Frequency: 5/week            Contractures Contractures Info: Not present    Additional Factors Info  Code Status, Allergies Code Status Info: Full Allergies Info: NKA           Current Medications (09/26/2017):  This is the current hospital active medication list Current Facility-Administered Medications  Medication Dose Route Frequency Provider Last Rate Last Dose  . acetaminophen (TYLENOL) tablet 650 mg  650 mg Oral Q6H PRN Isaac Bliss, Rayford Halsted, MD       Or  . acetaminophen (TYLENOL)  suppository 650 mg  650 mg Rectal Q6H PRN Isaac Bliss, Rayford Halsted, MD      . amoxicillin-clavulanate (AUGMENTIN) 400-57 MG/5ML suspension 875 mg  875 mg Oral Q12H Emokpae, Courage, MD   875 mg at 09/25/17 2239  . B-complex with vitamin C tablet 1 tablet  1 tablet Oral Daily Isaac Bliss, Rayford Halsted, MD   1 tablet at 09/26/17 0850  . docusate sodium  (COLACE) capsule 100 mg  100 mg Oral BID Isaac Bliss, Rayford Halsted, MD   100 mg at 09/26/17 0851  . feeding supplement (ENSURE ENLIVE) (ENSURE ENLIVE) liquid 237 mL  237 mL Oral BID BM Emokpae, Courage, MD   237 mL at 09/25/17 1330  . gabapentin (NEURONTIN) capsule 300 mg  300 mg Oral Q1200 Isaac Bliss, Rayford Halsted, MD   300 mg at 09/25/17 1324  . gabapentin (NEURONTIN) capsule 600 mg  600 mg Oral BID AC & HS Isaac Bliss, Rayford Halsted, MD   600 mg at 09/26/17 0850  . heparin injection 5,000 Units  5,000 Units Subcutaneous Q8H Bonnielee Haff, MD   5,000 Units at 09/26/17 [phone]  . lactobacillus (FLORANEX/LACTINEX) granules 1 g  1 g Oral TID WC Emokpae, Courage, MD   1 g at 09/26/17 0849  . latanoprost (XALATAN) 0.005 % ophthalmic solution 1 drop  1 drop Both Eyes QHS Isaac Bliss, Rayford Halsted, MD   1 drop at 09/25/17 2239  . ondansetron (ZOFRAN) tablet 4 mg  4 mg Oral Q6H PRN Isaac Bliss, Rayford Halsted, MD       Or  . ondansetron [facility]) injection 4 mg  4 mg Intravenous Q6H PRN Isaac Bliss, Rayford Halsted, MD      . oxyCODONE-acetaminophen (PERCOCET/ROXICET) 5-325 MG per tablet 1 tablet  1 tablet Oral Q6H PRN Isaac Bliss, Rayford Halsted, MD   1 tablet at 09/26/17 0612  . pantoprazole (PROTONIX) EC tablet 40 mg  40 mg Oral BID Bonnielee Haff, MD   40 mg at 09/26/17 0845  . pramipexole (MIRAPEX) tablet 2 mg  2 mg Oral TID Isaac Bliss, Rayford Halsted, MD   2 mg at 09/26/17 6283     Discharge Medications: Please see discharge summary for a list of discharge medications.  Relevant Imaging Results:  Relevant Lab Results:   Additional Information ssn: [id number]. Palliative care to follow at facility  Servando Snare, LCSW

## 2017-09-26 NOTE — Progress Notes (Signed)
Patient Demographics:    Derrick Sanchez, is a 84 y.o. male, DOB - [birth year], IPJ:[id number]  Admit date - 09/20/2017   Admitting Physician Erline Hau, MD  Outpatient Primary MD for the patient is [name], MD  LOS - 6   Chief Complaint  Patient presents with  . Back Pain  . Cough        Subjective:    Derrick Sanchez today has no fevers, no emesis,  No chest pain, no new complaints resting comfortably  Assessment  & Plan :    Active Problems:   Goals of care, counseling/discussion   CAP (community acquired pneumonia)   Acute on chronic respiratory failure with hypoxia (HCC)   Malnutrition (HCC)   FTT (failure to thrive) in adult   Pressure injury of skin   Multiple closed fractures of ribs of right side   Pleural effusion   Palliative care by specialist   Hypoxia   At high risk for aspiration  Reason for Visit: Pneumonia.  Pleural effusion.   Brief History/Interval Summary: [name] is a 84 y.o. male with history significant for dysphagia, discitis and osteomyelitis requiring long-term antibiotics now on daily suppression with Levaquin who 2 weeks ago suffered a fall while transferring himself from his motorized scooter into the bed.  He fell on his right side and had immediate right-sided chest pain.  Sometime thereafter he visited Dr. Carloyn Manner who is his neurosurgeon who imaged his back. Son states there was no mention of rib fractures at that time.  He presented because that right-sided chest pain has continued despite oxycodone. He developed a cough productive of yellow sputum and congestion.  In the ED he is found to have on chest x-ray multiple rib fractures on the right, right upper lobe pneumonia as well as a large left pleural effusion with midlung airspace opacities which could represent pneumonia.  Initial blood pressure was a little low with systolics in the 73A but has  responded well to some IV fluids, he is not febrile, blood work is essentially unremarkable, of note WBC count is 6.3 and lactic acid is normal at 1.8.  Patient was hospitalized for further management.  CT scan of the chest was done which showed moderate bilateral pleural effusion.  Palliative medicine was consulted.    Plan:- 1)Community-acquired pneumonia with concern for aspiration as well/acute respiratory failure with hypoxia- Patient is on Levaquin chronically for history of discitis.  Patient started on Zosyn after discussions with Dr. Johnnye [place],  Zosyn switched to Augmentin. Levaquin has been held for now.  Plan to transition back to Levaquin 500 mg daily upon discharge for discitis rather than pneumonia . speech and swallow evaluation consult appreciated, High suspicion of aspiration, patient has had extensive imaging and ordered speech workup recently, continue modified diet  2)Bilateral pleural effusion--  Repeat Chest x-ray on 09/24/17 is slightly improved, patient still has left-sided pleural effusion ,    No history of heart disease.  BNP only minimally elevated.  echocardiogram with EF of 60-65%, oxygen requirement is better patient is down to 3 L via nasal cannula continuously,  discussed with patient's son at bedside, hold off on thoracentesis due to radiological and clinical improvement.  Patient's son request that we  continue to hold anticoagulation for now just in case thoracentesis is needed, will repeat chest x-ray on 09/27/17  3)History of Left  lower extremity DVT Diagnosed in 07/2016 and was on Xarelto.  Hold Xarelto  for now in case patient needs to undergo thoracentesis.  4)History of vertebral osteomyelitis/discitis He is on chronic suppressive treatment with Levaquin.  Treated with Zosyn as recommended by Dr. Johnnye [place]a, switched to  Augmentin on 09/24/17, Plan to transition back to Levaquin 500 mg daily upon discharge for discitis   5)Generalized weakness and  deconditioning-PT eval appreciated ,  OT eval appreciated recommend skilled nursing facility for rehab  6)Lower extremity wounds- Wound care has been consulted.  7)Normocytic anemia Hemoglobin slightly low today compared to yesterday.  No evidence of overt bleeding.  Likely dilutional drop.   8)Goals of care-discussed with palliative care team who spoke with patient and son, had a long chat with patient and patient's son at bedside as well on 09/23/2017, patient remains a full code with full scope of treatment at this time.  Plan of care discussed with patient's son [name]  9)Disposition-social worker working on skilled nursing facility placement, no bed offers for now  DVT Prophylaxis: On Xarelto at home which has been held for now    Code Status: Full code Family Communication: Discussed with patient and his son Disposition Plan:  SNF  Consultants: Palliative medicine  Procedures: None yet  Antibiotics: Zosyn stopped 09/24/17 Start Augmentin on 09/24/17    DVT Prophylaxis  : Xarelto is on hold, subcu heparin for now  Lab Results  Component Value Date   PLT 155 09/26/2017    Inpatient Medications  Scheduled Meds: . amoxicillin-clavulanate  875 mg Oral Q12H  . B-complex with vitamin C  1 tablet Oral Daily  . docusate sodium  100 mg Oral BID  . feeding supplement (ENSURE ENLIVE)  237 mL Oral BID BM  . gabapentin  300 mg Oral Q1200  . gabapentin  600 mg Oral BID AC & HS  . heparin  5,000 Units Subcutaneous Q8H  . lactobacillus  1 g Oral TID WC  . latanoprost  1 drop Both Eyes QHS  . pantoprazole  40 mg Oral BID  . pramipexole  2 mg Oral TID   Continuous Infusions:  PRN Meds:.acetaminophen **OR** acetaminophen, ondansetron **OR** ondansetron (ZOFRAN) IV, oxyCODONE-acetaminophen    Anti-infectives (From admission, onward)   Start     Dose/Rate Route Frequency Ordered Stop   09/25/17 2200  amoxicillin-clavulanate (AUGMENTIN) 400-57 MG/5ML suspension 875 mg     875  mg Oral Every 12 hours 09/25/17 1438     09/24/17 2200  amoxicillin-clavulanate (AUGMENTIN) 600-42.9 MG/5ML suspension 875 mg  Status:  Discontinued     875 mg Oral 2 times daily 09/24/17 1612 09/25/17 1438   09/20/17 1730  piperacillin-tazobactam (ZOSYN) IVPB 3.375 g     3.375 g 12.5 mL/hr over 240 Minutes Intravenous Every 8 hours 09/20/17 1719 09/25/17 0129   09/20/17 1315  cefTRIAXone (ROCEPHIN) 1 g in sodium chloride 0.9 % 100 mL IVPB     1 g 200 mL/hr over 30 Minutes Intravenous  Once 09/20/17 1304 09/20/17 1402   09/20/17 1315  azithromycin (ZITHROMAX) 500 mg in sodium chloride 0.9 % 250 mL IVPB     500 mg 250 mL/hr over 60 Minutes Intravenous  Once 09/20/17 1304 09/20/17 1436        Objective:   Vitals:   09/26/17 0945 09/26/17 1341 09/26/17 1515 09/26/17 1521  BP: 111/73  123/67 139/65 139/65  Pulse: (!) 56 (!) 55 79 79  Resp: 16 19 20 20   Temp: 98.3 F (36.8 C) 98.4 F (36.9 C) 98.4 F (36.9 C) 98.1 F (36.7 C)  TempSrc: Oral Oral Oral Oral  SpO2: 94% 99% 99% 99%  Weight:      Height:        Wt Readings from Last 3 Encounters:  09/20/17 54.4 kg (120 lb)  04/07/17 65.8 kg (145 lb)  07/23/16 68 kg (150 lb)     Intake/Output Summary (Last 24 hours) at 09/26/2017 1825 Last data filed at 09/26/2017 1356 Gross per 24 hour  Intake 460 ml  Output 300 ml  Net 160 ml     Physical Exam  Gen:- Awake Alert, in no acute distress, chronically ill-appearing HEENT:- [place].AT, No sclera icterus Neck-Supple Neck,No JVD,.  Nose- [place] 3.5 L/min Lungs-diminished in bases, no wheezing CV- S1, S2 normal,  Abd-  +ve B.Sounds, Abd Soft, No tenderness,    Extremity/Skin:- No  edema, good pulses Psych-affect is appropriate, some cognitive deficits  neuro-generalized weakness without new focal deficits,  no tremors   Data Review:   Micro Results Recent Results (from the past 240 hour(s))  Blood culture (routine x 2)     Status: None   Collection Time: 09/20/17  1:25 PM    Result Value Ref Range Status   Specimen Description LEFT ANTECUBITAL  Final   Special Requests   Final    BOTTLES DRAWN AEROBIC AND ANAEROBIC Blood Culture adequate volume   Culture   Final    NO GROWTH 5 DAYS Performed at [facility], [address] [place], [place] [address]    Report Status 09/25/2017 FINAL  Final  Blood culture (routine x 2)     Status: None   Collection Time: 09/20/17  1:32 PM  Result Value Ref Range Status   Specimen Description BLOOD RIGHT HAND  Final   Special Requests   Final    BOTTLES DRAWN AEROBIC AND ANAEROBIC Blood Culture adequate volume   Culture   Final    NO GROWTH 5 DAYS Performed at [facility], [address] [place], [place] [address]    Report Status 09/25/2017 FINAL  Final    Radiology Reports Dg Chest 2 View  Result Date: 09/24/2017 CLINICAL DATA:  Shortness of breath, weakness EXAM: CHEST - 2 VIEW COMPARISON:  09/20/2017; correlation interval CT chest 09/21/2017 FINDINGS: Enlargement of cardiac silhouette with vascular congestion. Atherosclerotic calcification aorta. Bibasilar effusions. Atelectasis versus consolidation LEFT lower lobe. Improved upper lobe infiltrates more so on LEFT with residual opacity in the periphery of the RIGHT upper lobe. Central peribronchial thickening. No pneumothorax. Bones demineralized. IMPRESSION: Bibasilar effusions and atelectasis. Bronchitic changes with improved upper lobe infiltrates. Electronically Signed   By: Lavonia Dana M.D.   On: 09/24/2017 09:15   Ct Chest W Contrast  Result Date: 09/21/2017 CLINICAL DATA:  The patient suffered a fall transferring from a motorized scooter to bed 2 weeks ago with a blow to the right side and onset of right chest pain. Initial encounter. EXAM: CT CHEST WITH CONTRAST TECHNIQUE: Multidetector CT imaging of the chest was performed during intravenous contrast administration. CONTRAST:  75 mL OMNIPAQUE IOHEXOL 300 MG/ML  SOLN COMPARISON:  Single-view of the chest  09/20/2016 and 10/19/2015. Plain films right shoulder 09/10/2017. CT chest 02/02/2014. FINDINGS: Cardiovascular: There is mild cardiomegaly. No pericardial effusion. Extensive calcific aortic and coronary atherosclerosis is identified. Large varix right subclavian vein noted. Mediastinum/Nodes: No  axillary, hilar or mediastinal lymphadenopathy. Small to moderate hiatal hernia is noted. Lungs/Pleura: The patient has moderate bilateral pleural effusions. There is associated compressive atelectasis in the posterior aspects of both lower lobes. Patchy airspace disease is identified in the superior segment of the left lower lobe. Upper Abdomen: There is partial visualization of large bilateral renal cysts which appear unchanged. No acute abnormality is identified. Musculoskeletal: The right shoulder is anteriorly dislocated. Marked anterior subluxation of the left shoulder is identified. The patient has severe cervical spondylosis with 0.5 cm anterolisthesis C5 on C6 and severe loss of disc space height at C5-6 and C6-7. The patient has multiple right rib fractures all of which appear late subacute to remote with callus formation identified about the fractures. No acute fracture is seen. IMPRESSION: Anterior dislocation of the right shoulder as seen on comparison plain films 09/10/2017. Multiple remote right rib fractures. No acute right rib fracture is identified. Marked anterior subluxation of the left humeral head on the glenoid consistent with glenohumeral joint instability. Moderate bilateral pleural effusions. There is associated compressive atelectasis. Patchy airspace disease in the superior segment of the left lower lobe could be due to atelectasis but has an appearance worrisome for pneumonia. Extensive coronary atherosclerosis Advanced cervical spondylosis C5-6 and C6-7. Aortic Atherosclerosis (ICD10-I70.0). Electronically Signed   By: Inge Rise M.D.   On: 09/21/2017 15:49   Dg Chest Portable 1  View  Result Date: 09/20/2017 CLINICAL DATA:  Back pain.  Hypertension history of pneumonia. EXAM: PORTABLE CHEST 1 VIEW COMPARISON:  10/19/2015 FINDINGS: Obscuration of the left hemidiaphragm with left mid lung airspace opacity and potential left basilar airspace opacity. Suspected left pleural effusion with density peripherally in the left chest. The patient is rotated to the left on today's radiograph, reducing diagnostic sensitivity and specificity. Multiple healing right rib fractures. Peripheral interstitial accentuation in the right upper lobe. Difficult to assess the heart due to obscuration of cardiac margins and leftward rotation. Left glenohumeral joint appears dislocated. Possible right medial basilar airspace opacity. IMPRESSION: 1. Marked leftward rotation reduces diagnostic sensitivity. 2. Large left pleural effusion with left basilar and mid lung airspace opacities which could be from pneumonia or atelectasis. 3. Peripheral confluent interstitial opacity in the right upper lobe could reflect atypical infectious process but is technically nonspecific. 4. Multiple interval rib fractures since the prior exam from 10/19/2015 on the right, with callus formation from healing response. 5. Dislocated right glenohumeral joint, as on recent shoulder radiographs. 6. Chest CT could be utilized to further characterize the abnormalities in the chest, if clinically warranted. Electronically Signed   By: Van Clines M.D.   On: 09/20/2017 13:07     CBC Recent Labs  Lab 09/20/17 1300 09/22/17 0553 09/23/17 0614 09/26/17 0509  WBC 6.3 4.3 5.9 5.1  HGB 9.0* 8.2* 8.5* 9.4*  HCT 29.3* 27.4* 28.2* 31.2*  PLT 172 149* 138* 155  MCV 89.1 92.3 91.6 90.2  MCH 27.4 27.6 27.6 27.2  MCHC 30.7 29.9* 30.1 30.1  RDW 16.8* 17.3* 17.1* 17.4*  LYMPHSABS 1.3  --   --   --   MONOABS 0.4  --   --   --   EOSABS 0.0  --   --   --   BASOSABS 0.0  --   --   --     Chemistries  Recent Labs  Lab 09/20/17 1300  09/22/17 0553 09/23/17 0614 09/26/17 0509  NA 140 141 140 141  K 4.6 4.3 3.9 3.7  CL 110  114* 109 109  CO2 23 21* 25 22  GLUCOSE 100* 89 90 84  BUN 36* 25* 25* 25*  CREATININE 1.15 1.04 1.03 0.83  CALCIUM 7.4* 7.3* 7.4* 7.7*  AST 19  --   --   --   ALT 10*  --   --   --   ALKPHOS 87  --   --   --   BILITOT 0.6  --   --   --    ------------------------------------------------------------------------------------------------------------------ No results for input(s): CHOL, HDL, LDLCALC, TRIG, CHOLHDL, LDLDIRECT in the last 72 hours.  No results found for: HGBA1C ------------------------------------------------------------------------------------------------------------------ No results for input(s): TSH, T4TOTAL, T3FREE, THYROIDAB in the last 72 hours.  Invalid input(s): FREET3 ------------------------------------------------------------------------------------------------------------------ No results for input(s): VITAMINB12, FOLATE, FERRITIN, TIBC, IRON, RETICCTPCT in the last 72 hours.  Coagulation profile No results for input(s): INR, PROTIME in the last 168 hours.  No results for input(s): DDIMER in the last 72 hours.  Cardiac Enzymes Recent Labs  Lab 09/20/17 1300  TROPONINI <0.03   ------------------------------------------------------------------------------------------------------------------    Component Value Date/Time   BNP 241.4 (H) 09/22/2017 2500    Roxan Hockey M.D on 09/26/2017 at 6:25 PM  Between 7am to 7pm - Pager - [phone]  After 7pm go to [email or web address] - password [email or web address]  [facility] -  Office  [phone]   Voice Recognition Viviann Spare dictation system was used to create this note, attempts have been made to correct errors. Please contact the author with questions and/or clarifications.

## 2017-09-26 NOTE — Progress Notes (Signed)
LCSW following for SNF placement.   Patient and family agreeable to SNF for rehab.   Patient needs bed offers.  LCSW will continue to follow.   Carolin Coy [place]

## 2017-09-27 ENCOUNTER — Inpatient Hospital Stay (HOSPITAL_COMMUNITY): Payer: PPO

## 2017-09-27 NOTE — Progress Notes (Signed)
Dr. Joesph Fillers:  PLEASE NOTE THAT PT'S SON HAS CALLED NURSE AND STATED THAT HE does WANT THE THORACENTESIS PRIOR TO PT BEING DISCHARGED TO SNF

## 2017-09-27 NOTE — Progress Notes (Signed)
Patient Demographics:    Derrick Sanchez, is a 84 y.o. male, DOB - 05-21-1931, AST:[id number]  Admit date - 09/20/2017   Admitting Physician Erline Hau, MD  Outpatient Primary MD for the patient is [name], MD  LOS - 7   Chief Complaint  Patient presents with  . Back Pain  . Cough        Subjective:    Derrick Sanchez today has no fevers, no emesis,  No chest pain, no new complaints resting comfortably  Assessment  & Plan :    Active Problems:   Goals of care, counseling/discussion   CAP (community acquired pneumonia)   Acute on chronic respiratory failure with hypoxia (HCC)   Malnutrition (HCC)   FTT (failure to thrive) in adult   Pressure injury of skin   Multiple closed fractures of ribs of right side   Pleural effusion   Palliative care by specialist   Hypoxia   At high risk for aspiration  Reason for Visit: Pneumonia.  Pleural effusion.   Brief History/Interval Summary: Derrick Sanchez is a 84 y.o. male with history significant for dysphagia, discitis and osteomyelitis requiring long-term antibiotics now on daily suppression with Levaquin who 2 weeks ago suffered a fall while transferring himself from his motorized scooter into the bed.  He fell on his right side and had immediate right-sided chest pain.  Sometime thereafter he visited Dr. Carloyn Manner who is his neurosurgeon who imaged his back. Son states there was no mention of rib fractures at that time.  He presented because that right-sided chest pain has continued despite oxycodone. He developed a cough productive of yellow sputum and congestion.  In the ED he is found to have on chest x-ray multiple rib fractures on the right, right upper lobe pneumonia as well as a large left pleural effusion with midlung airspace opacities which could represent pneumonia.  Initial blood pressure was a little low with systolics in the 98X but has  responded well to some IV fluids, he is not febrile, blood work is essentially unremarkable, of note WBC count is 6.3 and lactic acid is normal at 1.8.  Patient was hospitalized for further management.  CT scan of the chest was done which showed moderate bilateral pleural effusion.  Palliative medicine was consulted.  Currently awaiting placement to skilled nursing facility    Plan:- 1)Community-acquired pneumonia with concern for aspiration as well/acute respiratory failure with hypoxia-overall improving from a respiratory standpoint, continues to require 3-4 L of oxygen via nasal cannula.  patient is on Levaquin chronically for history of discitis.  Patient started on Zosyn after discussions with Dr. Johnnye Sima,  Zosyn switched to Augmentin. Levaquin has been held for now.  Plan to transition back to Levaquin 500 mg daily upon discharge for discitis rather than pneumonia . speech and swallow evaluation consult appreciated, High suspicion of aspiration, patient has had extensive imaging and ordered speech workup recently, continue modified diet  2)Bilateral pleural effusion--this is most likely contributing to acute hypoxic respiratory failure, serial chest x-ray shows stable pleural effusion especially on the left, patient's son we decide if he wants therapeutic thoracentesis prior to discharge to skilled nursing facility,   echocardiogram with EF of 60-65%,   Patient's son request  that we continue to hold anticoagulation for now just in case thoracentesis is needed,   3)History of Left  lower extremity DVT Diagnosed in 07/2016 and was on Xarelto.  Hold Xarelto  for now in case patient needs to undergo thoracentesis, continue subcu heparin for DVT prophylaxis  4)History of vertebral osteomyelitis/discitis-He is on chronic suppressive treatment with Levaquin.  Treated with Zosyn as recommended by Dr. Johnnye Sima, switched to  Augmentin on 09/24/17, Plan to transition back to Levaquin 500 mg daily upon  discharge for discitis   5)Generalized weakness and deconditioning-PT eval appreciated ,  OT eval appreciated recommend skilled nursing facility for rehab  6)Lower extremity wounds- Wound care has been consulted.  7)Normocytic anemia Hemoglobin slightly low today compared to yesterday.  No evidence of overt bleeding.  Likely dilutional drop.   8)Goals of care-discussed with palliative care team who spoke with patient and son, , patient remains a full code with full scope of treatment at this time.  Plan of care discussed with patient's son Derrick Sanchez  9)Disposition-social worker working on skilled nursing facility placement, no bed offers for now, patient's son we decide if he wants therapeutic thoracentesis prior to discharge to skilled nursing facility,   10)FEN-tolerating pured diet well overall  DVT Prophylaxis: On Xarelto at home which has been held for now    Code Status: Full code Family Communication: Discussed with patient and his son Disposition Plan:  SNF  Consultants: Palliative medicine  Procedures: None yet/patient's son we decide if he wants therapeutic thoracentesis prior to discharge to skilled nursing facility,   Antibiotics: Zosyn stopped 09/24/17 Start Augmentin on 09/24/17    DVT Prophylaxis  : Xarelto is on hold, subcu heparin for now  Lab Results  Component Value Date   PLT 155 09/26/2017    Inpatient Medications  Scheduled Meds: . amoxicillin-clavulanate  875 mg Oral Q12H  . B-complex with vitamin C  1 tablet Oral Daily  . docusate sodium  100 mg Oral BID  . feeding supplement (ENSURE ENLIVE)  237 mL Oral BID BM  . gabapentin  300 mg Oral Q1200  . gabapentin  600 mg Oral BID AC & HS  . heparin  5,000 Units Subcutaneous Q8H  . lactobacillus  1 g Oral TID WC  . latanoprost  1 drop Both Eyes QHS  . pantoprazole  40 mg Oral BID  . pramipexole  2 mg Oral TID   Continuous Infusions:  PRN Meds:.acetaminophen **OR** acetaminophen, ondansetron  **OR** ondansetron (ZOFRAN) IV, oxyCODONE-acetaminophen    Anti-infectives (From admission, onward)   Start     Dose/Rate Route Frequency Ordered Stop   09/25/17 2200  amoxicillin-clavulanate (AUGMENTIN) 400-57 MG/5ML suspension 875 mg     875 mg Oral Every 12 hours 09/25/17 1438     09/24/17 2200  amoxicillin-clavulanate (AUGMENTIN) 600-42.9 MG/5ML suspension 875 mg  Status:  Discontinued     875 mg Oral 2 times daily 09/24/17 1612 09/25/17 1438   09/20/17 1730  piperacillin-tazobactam (ZOSYN) IVPB 3.375 g     3.375 g 12.5 mL/hr over 240 Minutes Intravenous Every 8 hours 09/20/17 1719 09/25/17 0129   09/20/17 1315  cefTRIAXone (ROCEPHIN) 1 g in sodium chloride 0.9 % 100 mL IVPB     1 g 200 mL/hr over 30 Minutes Intravenous  Once 09/20/17 1304 09/20/17 1402   09/20/17 1315  azithromycin (ZITHROMAX) 500 mg in sodium chloride 0.9 % 250 mL IVPB     500 mg 250 mL/hr over 60 Minutes Intravenous  Once 09/20/17  1304 09/20/17 1436        Objective:   Vitals:   09/26/17 2049 09/27/17 0548 09/27/17 1000 09/27/17 1400  BP: 116/74 113/69 129/75 131/69  Pulse: 65 61 (!) 57 60  Resp: 19 18 19 18   Temp: (!) 97.5 F (36.4 C) 98.1 F (36.7 C) 98.3 F (36.8 C) (!) 97.5 F (36.4 C)  TempSrc: Oral Oral Oral Oral  SpO2: 91% 95% 100% 97%  Weight:      Height:        Wt Readings from Last 3 Encounters:  09/20/17 54.4 kg (120 lb)  04/07/17 65.8 kg (145 lb)  07/23/16 68 kg (150 lb)     Intake/Output Summary (Last 24 hours) at 09/27/2017 1730 Last data filed at 09/27/2017 0945 Gross per 24 hour  Intake 300 ml  Output 100 ml  Net 200 ml     Physical Exam  Gen:- Awake Alert, in no acute distress, chronically ill-appearing HEENT:- [place].AT, No sclera icterus Neck-Supple Neck,No JVD,.  Nose- [place] 3.5 L/min Lungs-diminished in bases, no wheezing CV- S1, S2 normal,  Abd-  +ve B.Sounds, Abd Soft, No tenderness,    Extremity/Skin:- No  edema, good pulses Psych-affect is appropriate, some  cognitive deficits  neuro-generalized weakness without new focal deficits,  no tremors   Data Review:   Micro Results Recent Results (from the past 240 hour(s))  Blood culture (routine x 2)     Status: None   Collection Time: 09/20/17  1:25 PM  Result Value Ref Range Status   Specimen Description LEFT ANTECUBITAL  Final   Special Requests   Final    BOTTLES DRAWN AEROBIC AND ANAEROBIC Blood Culture adequate volume   Culture   Final    NO GROWTH 5 DAYS Performed at [facility], [address], [place], [place] [address]    Report Status 09/25/2017 FINAL  Final  Blood culture (routine x 2)     Status: None   Collection Time: 09/20/17  1:32 PM  Result Value Ref Range Status   Specimen Description BLOOD RIGHT HAND  Final   Special Requests   Final    BOTTLES DRAWN AEROBIC AND ANAEROBIC Blood Culture adequate volume   Culture   Final    NO GROWTH 5 DAYS Performed at [facility], [address], [place], [place] [address]    Report Status 09/25/2017 FINAL  Final    Radiology Reports Dg Chest 2 View  Result Date: 09/27/2017 CLINICAL DATA:  Shortness of breath. EXAM: CHEST - 2 VIEW COMPARISON:  Chest x-rays dated 09/24/2017 and 09/20/2017. chest CT dated 09/21/2017. FINDINGS: Bibasilar opacities are unchanged, compatible with effusions and atelectasis demonstrated on earlier chest CT. Upper lungs are now relatively clear. No pneumothorax seen. Heart size and mediastinal contours are stable. Again noted is dislocation of the RIGHT humeral head. No acute or suspicious osseous finding. IMPRESSION: 1. Stable bibasilar opacities, compatible with the pleural effusions and associated atelectasis demonstrated on earlier chest CT. No new lung findings. 2. RIGHT humeral head dislocation, as described on multiple prior reports. Electronically Signed   By: Franki Cabot M.D.   On: 09/27/2017 09:36   Dg Chest 2 View  Result Date: 09/24/2017 CLINICAL DATA:  Shortness of breath, weakness EXAM: CHEST -  2 VIEW COMPARISON:  09/20/2017; correlation interval CT chest 09/21/2017 FINDINGS: Enlargement of cardiac silhouette with vascular congestion. Atherosclerotic calcification aorta. Bibasilar effusions. Atelectasis versus consolidation LEFT lower lobe. Improved upper lobe infiltrates more so on LEFT with residual opacity in  the periphery of the RIGHT upper lobe. Central peribronchial thickening. No pneumothorax. Bones demineralized. IMPRESSION: Bibasilar effusions and atelectasis. Bronchitic changes with improved upper lobe infiltrates. Electronically Signed   By: Lavonia Dana M.D.   On: 09/24/2017 09:15   Ct Chest W Contrast  Result Date: 09/21/2017 CLINICAL DATA:  The patient suffered a fall transferring from a motorized scooter to bed 2 weeks ago with a blow to the right side and onset of right chest pain. Initial encounter. EXAM: CT CHEST WITH CONTRAST TECHNIQUE: Multidetector CT imaging of the chest was performed during intravenous contrast administration. CONTRAST:  75 mL OMNIPAQUE IOHEXOL 300 MG/ML  SOLN COMPARISON:  Single-view of the chest 09/20/2016 and 10/19/2015. Plain films right shoulder 09/10/2017. CT chest 02/02/2014. FINDINGS: Cardiovascular: There is mild cardiomegaly. No pericardial effusion. Extensive calcific aortic and coronary atherosclerosis is identified. Large varix right subclavian vein noted. Mediastinum/Nodes: No axillary, hilar or mediastinal lymphadenopathy. Small to moderate hiatal hernia is noted. Lungs/Pleura: The patient has moderate bilateral pleural effusions. There is associated compressive atelectasis in the posterior aspects of both lower lobes. Patchy airspace disease is identified in the superior segment of the left lower lobe. Upper Abdomen: There is partial visualization of large bilateral renal cysts which appear unchanged. No acute abnormality is identified. Musculoskeletal: The right shoulder is anteriorly dislocated. Marked anterior subluxation of the left shoulder is  identified. The patient has severe cervical spondylosis with 0.5 cm anterolisthesis C5 on C6 and severe loss of disc space height at C5-6 and C6-7. The patient has multiple right rib fractures all of which appear late subacute to remote with callus formation identified about the fractures. No acute fracture is seen. IMPRESSION: Anterior dislocation of the right shoulder as seen on comparison plain films 09/10/2017. Multiple remote right rib fractures. No acute right rib fracture is identified. Marked anterior subluxation of the left humeral head on the glenoid consistent with glenohumeral joint instability. Moderate bilateral pleural effusions. There is associated compressive atelectasis. Patchy airspace disease in the superior segment of the left lower lobe could be due to atelectasis but has an appearance worrisome for pneumonia. Extensive coronary atherosclerosis Advanced cervical spondylosis C5-6 and C6-7. Aortic Atherosclerosis (ICD10-I70.0). Electronically Signed   By: Inge Rise M.D.   On: 09/21/2017 15:49   Dg Chest Portable 1 View  Result Date: 09/20/2017 CLINICAL DATA:  Back pain.  Hypertension history of pneumonia. EXAM: PORTABLE CHEST 1 VIEW COMPARISON:  10/19/2015 FINDINGS: Obscuration of the left hemidiaphragm with left mid lung airspace opacity and potential left basilar airspace opacity. Suspected left pleural effusion with density peripherally in the left chest. The patient is rotated to the left on today's radiograph, reducing diagnostic sensitivity and specificity. Multiple healing right rib fractures. Peripheral interstitial accentuation in the right upper lobe. Difficult to assess the heart due to obscuration of cardiac margins and leftward rotation. Left glenohumeral joint appears dislocated. Possible right medial basilar airspace opacity. IMPRESSION: 1. Marked leftward rotation reduces diagnostic sensitivity. 2. Large left pleural effusion with left basilar and mid lung airspace  opacities which could be from pneumonia or atelectasis. 3. Peripheral confluent interstitial opacity in the right upper lobe could reflect atypical infectious process but is technically nonspecific. 4. Multiple interval rib fractures since the prior exam from 10/19/2015 on the right, with callus formation from healing response. 5. Dislocated right glenohumeral joint, as on recent shoulder radiographs. 6. Chest CT could be utilized to further characterize the abnormalities in the chest, if clinically warranted. Electronically Signed   By: Thayer Jew  Janeece Fitting M.D.   On: 09/20/2017 13:07     CBC Recent Labs  Lab 09/22/17 0553 09/23/17 0614 09/26/17 0509  WBC 4.3 5.9 5.1  HGB 8.2* 8.5* 9.4*  HCT 27.4* 28.2* 31.2*  PLT 149* 138* 155  MCV 92.3 91.6 90.2  MCH 27.6 27.6 27.2  MCHC 29.9* 30.1 30.1  RDW 17.3* 17.1* 17.4*    Chemistries  Recent Labs  Lab 09/22/17 0553 09/23/17 0614 09/26/17 0509  NA 141 140 141  K 4.3 3.9 3.7  CL 114* 109 109  CO2 21* 25 22  GLUCOSE 89 90 84  BUN 25* 25* 25*  CREATININE 1.04 1.03 0.83  CALCIUM 7.3* 7.4* 7.7*   ------------------------------------------------------------------------------------------------------------------ No results for input(s): CHOL, HDL, LDLCALC, TRIG, CHOLHDL, LDLDIRECT in the last 72 hours.  No results found for: HGBA1C ------------------------------------------------------------------------------------------------------------------ No results for input(s): TSH, T4TOTAL, T3FREE, THYROIDAB in the last 72 hours.  Invalid input(s): FREET3 ------------------------------------------------------------------------------------------------------------------ No results for input(s): VITAMINB12, FOLATE, FERRITIN, TIBC, IRON, RETICCTPCT in the last 72 hours.  Coagulation profile No results for input(s): INR, PROTIME in the last 168 hours.  No results for input(s): DDIMER in the last 72 hours.  Cardiac Enzymes No results for input(s):  CKMB, TROPONINI, MYOGLOBIN in the last 168 hours.  Invalid input(s): CK ------------------------------------------------------------------------------------------------------------------    Component Value Date/Time   BNP 241.4 (H) 09/22/2017 5784    Roxan Hockey M.D on 09/27/2017 at 5:30 PM  Between 7am to 7pm - Pager - [phone]  After 7pm go to [email or web address] - password [email or web address]  [facility] -  Office  [phone]   Voice Recognition Viviann Spare dictation system was used to create this note, attempts have been made to correct errors. Please contact the author with questions and/or clarifications.

## 2017-09-28 ENCOUNTER — Inpatient Hospital Stay (HOSPITAL_COMMUNITY): Payer: PPO

## 2017-09-28 MED ORDER — LIDOCAINE HCL 1 % IJ SOLN
INTRAMUSCULAR | Status: AC
  Filled 2017-09-28: qty 10

## 2017-09-28 NOTE — Progress Notes (Signed)
Patient Demographics:    Derrick Sanchez, is a 84 y.o. male, DOB - [birth year], ZJI:[id number]  Admit date - 09/20/2017   Admitting Physician Erline Hau, MD  Outpatient Primary MD for the patient is [name], MD  LOS - 8  Chief Complaint  Patient presents with  . Back Pain  . Cough        Subjective:    [name] today has no fevers, no emesis,  No chest pain, resting comfortably after thoracentesis, oral intake is fair  Assessment  & Plan :    Active Problems:   Goals of care, counseling/discussion   CAP (community acquired pneumonia)   Acute on chronic respiratory failure with hypoxia (HCC)   Malnutrition (HCC)   FTT (failure to thrive) in adult   Pressure injury of skin   Multiple closed fractures of ribs of right side   Pleural effusion   Palliative care by specialist   Hypoxia   At high risk for aspiration  Reason for Visit: Pneumonia.  Pleural effusion.   Brief History/Interval Summary: ADIEN Sanchez is a 84 y.o. male with history significant for dysphagia, discitis and osteomyelitis requiring long-term antibiotics now on daily suppression with Levaquin who 2 weeks ago suffered a fall while transferring himself from his motorized scooter into the bed.  He fell on his right side and had immediate right-sided chest pain.  Sometime thereafter he visited Dr. Carloyn Manner who is his neurosurgeon who imaged his back. Son states there was no mention of rib fractures at that time.  He presented because that right-sided chest pain has continued despite oxycodone. He developed a cough productive of yellow sputum and congestion.  In the ED he is found to have on chest x-ray multiple rib fractures on the right, right upper lobe pneumonia as well as a large left pleural effusion with midlung airspace opacities which could represent pneumonia.  Initial blood pressure was a little low with  systolics in the 17P but has responded well to some IV fluids, he is not febrile, blood work is essentially unremarkable, of note WBC count is 6.3 and lactic acid is normal at 1.8.  Patient was hospitalized for further management.  CT scan of the chest was done which showed moderate bilateral pleural effusion.  Palliative medicine was consulted.  Status post right sided thoracentesis on 09/28/2017 with removal of 750 mL of fluid,  currently awaiting placement to skilled nursing facility    Plan:- 1)Community-acquired pneumonia with concern for aspiration as well/acute respiratory failure with hypoxia-overall improving from a respiratory standpoint, continues to require 3-4 L of oxygen via nasal cannula.  patient is on Levaquin chronically for history of discitis.  Patient started on Zosyn after discussions with Dr. Johnnye Sima,  Zosyn switched to Augmentin. Levaquin has been held for now.  Plan to transition back to Levaquin 500 mg daily upon discharge for discitis rather than pneumonia . speech and swallow evaluation consult appreciated, High suspicion of aspiration, patient has had extensive imaging and ordered speech workup recently, continue modified diet  2)Bilateral pleural effusion-- Rt > Lt, clinically and radiologically improved , Status post right sided thoracentesis on 09/28/2017 with removal of 750 mL of fluid this is most likely contributing to acute hypoxic respiratory  failure, echocardiogram with EF of 60-65%,   Patient's son request that we continue to hold anticoagulation for now just in case thoracentesis is needed,   3)History of Left  lower extremity DVT Diagnosed in 07/2016 and was on Xarelto.  Hold Xarelto  for now in case patient needs to undergo thoracentesis, continue subcu heparin for DVT prophylaxis , pt's son/POA to Decide If he  Wants Xarelto Restarted upon Discharge  4)History of vertebral osteomyelitis/discitis-He is on chronic suppressive treatment with Levaquin.  Treated with  Zosyn as recommended by Dr. Johnnye Sima, switched to  Augmentin on 09/24/17, Plan to transition back to Levaquin 500 mg daily upon discharge for discitis   5)Generalized weakness and deconditioning-PT eval appreciated ,  OT eval appreciated recommend skilled nursing facility for rehab  6)Lower extremity wounds- Wound care has been consulted.  7)Normocytic anemia Hemoglobin slightly low today compared to yesterday.  No evidence of overt bleeding.  Likely dilutional drop.   8)Goals of care-discussed with palliative care team who spoke with patient and son, , patient remains a full code with full scope of treatment at this time.  Plan of care discussed with patient's son [name]  9)Disposition-social worker working on skilled nursing facility placement,   10)FEN-tolerating pured diet well overall  DVT Prophylaxis: On Xarelto at home which has been held for now    Code Status: Full code Family Communication: Discussed with patient and his son Disposition Plan:  SNF  Consultants: Palliative medicine  Procedures: Status post right sided thoracentesis on 09/28/2017 with removal of 750 mL of fluid Antibiotics: Zosyn stopped 09/24/17 Start Augmentin on 09/24/17    DVT Prophylaxis  : Xarelto is on hold, subcu heparin for now  Lab Results  Component Value Date   PLT 155 09/26/2017    Inpatient Medications  Scheduled Meds: . amoxicillin-clavulanate  875 mg Oral Q12H  . B-complex with vitamin C  1 tablet Oral Daily  . docusate sodium  100 mg Oral BID  . feeding supplement (ENSURE ENLIVE)  237 mL Oral BID BM  . gabapentin  300 mg Oral Q1200  . gabapentin  600 mg Oral BID AC & HS  . lactobacillus  1 g Oral TID WC  . latanoprost  1 drop Both Eyes QHS  . lidocaine      . pantoprazole  40 mg Oral BID  . pramipexole  2 mg Oral TID   Continuous Infusions:  PRN Meds:.acetaminophen **OR** acetaminophen, ondansetron **OR** ondansetron (ZOFRAN) IV,  oxyCODONE-acetaminophen    Anti-infectives (From admission, onward)   Start     Dose/Rate Route Frequency Ordered Stop   09/25/17 2200  amoxicillin-clavulanate (AUGMENTIN) 400-57 MG/5ML suspension 875 mg     875 mg Oral Every 12 hours 09/25/17 1438     09/24/17 2200  amoxicillin-clavulanate (AUGMENTIN) 600-42.9 MG/5ML suspension 875 mg  Status:  Discontinued     875 mg Oral 2 times daily 09/24/17 1612 09/25/17 1438   09/20/17 1730  piperacillin-tazobactam (ZOSYN) IVPB 3.375 g     3.375 g 12.5 mL/hr over 240 Minutes Intravenous Every 8 hours 09/20/17 1719 09/25/17 0129   09/20/17 1315  cefTRIAXone (ROCEPHIN) 1 g in sodium chloride 0.9 % 100 mL IVPB     1 g 200 mL/hr over 30 Minutes Intravenous  Once 09/20/17 1304 09/20/17 1402   09/20/17 1315  azithromycin (ZITHROMAX) 500 mg in sodium chloride 0.9 % 250 mL IVPB     500 mg 250 mL/hr over 60 Minutes Intravenous  Once 09/20/17 1304 09/20/17 1436  Objective:   Vitals:   09/28/17 0700 09/28/17 1202 09/28/17 1233 09/28/17 1340  BP:  (!) 148/83 (!) 148/84 139/79  Pulse:    (!) 57  Resp:    16  Temp:    (!) 97.5 F (36.4 C)  TempSrc:    Oral  SpO2: 98%   94%  Weight:      Height:        Wt Readings from Last 3 Encounters:  09/20/17 54.4 kg (120 lb)  04/07/17 65.8 kg (145 lb)  07/23/16 68 kg (150 lb)     Intake/Output Summary (Last 24 hours) at 09/28/2017 1738 Last data filed at 09/28/2017 1728 Gross per 24 hour  Intake 700 ml  Output 401 ml  Net 299 ml     Physical Exam  Gen:- Awake Alert, in no acute distress, chronically ill-appearing HEENT:- [place].AT, No sclera icterus Neck-Supple Neck,No JVD,.  Nose- [place] 3.5 L/min Lungs-improved air movement especially on the right, no wheezing CV- S1, S2 normal,  Abd-  +ve B.Sounds, Abd Soft, No tenderness,    Extremity/Skin:- No  edema, good pulses Psych-affect is appropriate, some cognitive deficits  neuro-generalized weakness without new focal deficits,  no tremors    Data Review:   Micro Results Recent Results (from the past 240 hour(s))  Blood culture (routine x 2)     Status: None   Collection Time: 09/20/17  1:25 PM  Result Value Ref Range Status   Specimen Description LEFT ANTECUBITAL  Final   Special Requests   Final    BOTTLES DRAWN AEROBIC AND ANAEROBIC Blood Culture adequate volume   Culture   Final    NO GROWTH 5 DAYS Performed at [facility], [address], [place], [place] [address]    Report Status 09/25/2017 FINAL  Final  Blood culture (routine x 2)     Status: None   Collection Time: 09/20/17  1:32 PM  Result Value Ref Range Status   Specimen Description BLOOD RIGHT HAND  Final   Special Requests   Final    BOTTLES DRAWN AEROBIC AND ANAEROBIC Blood Culture adequate volume   Culture   Final    NO GROWTH 5 DAYS Performed at [facility], [address] [place], [place] [address]    Report Status 09/25/2017 FINAL  Final    Radiology Reports Dg Chest 1 View  Result Date: 09/28/2017 CLINICAL DATA:  Post right thoracentesis EXAM: CHEST  1 VIEW COMPARISON:  For 14 2019 FINDINGS: Significant improvement in right pleural effusion which is now very small. No pneumothorax. Improved aeration right lung base Moderate left effusion and left lower lobe airspace disease remain unchanged. Negative for heart failure or edema. Chronic right rib fractures. Chronic bilateral shoulder subluxation. IMPRESSION: Improvement in right pleural effusion without pneumothorax No interval change in left pleural effusion and left lower lobe airspace disease. Electronically Signed   By: Franchot Gallo M.D.   On: 09/28/2017 13:03   Dg Chest 2 View  Result Date: 09/27/2017 CLINICAL DATA:  Shortness of breath. EXAM: CHEST - 2 VIEW COMPARISON:  Chest x-rays dated 09/24/2017 and 09/20/2017. chest CT dated 09/21/2017. FINDINGS: Bibasilar opacities are unchanged, compatible with effusions and atelectasis demonstrated on earlier chest CT. Upper lungs are now relatively  clear. No pneumothorax seen. Heart size and mediastinal contours are stable. Again noted is dislocation of the RIGHT humeral head. No acute or suspicious osseous finding. IMPRESSION: 1. Stable bibasilar opacities, compatible with the pleural effusions and associated atelectasis demonstrated on earlier chest  CT. No new lung findings. 2. RIGHT humeral head dislocation, as described on multiple prior reports. Electronically Signed   By: Franki Cabot M.D.   On: 09/27/2017 09:36   Dg Chest 2 View  Result Date: 09/24/2017 CLINICAL DATA:  Shortness of breath, weakness EXAM: CHEST - 2 VIEW COMPARISON:  09/20/2017; correlation interval CT chest 09/21/2017 FINDINGS: Enlargement of cardiac silhouette with vascular congestion. Atherosclerotic calcification aorta. Bibasilar effusions. Atelectasis versus consolidation LEFT lower lobe. Improved upper lobe infiltrates more so on LEFT with residual opacity in the periphery of the RIGHT upper lobe. Central peribronchial thickening. No pneumothorax. Bones demineralized. IMPRESSION: Bibasilar effusions and atelectasis. Bronchitic changes with improved upper lobe infiltrates. Electronically Signed   By: Lavonia Dana M.D.   On: 09/24/2017 09:15   Ct Chest W Contrast  Result Date: 09/21/2017 CLINICAL DATA:  The patient suffered a fall transferring from a motorized scooter to bed 2 weeks ago with a blow to the right side and onset of right chest pain. Initial encounter. EXAM: CT CHEST WITH CONTRAST TECHNIQUE: Multidetector CT imaging of the chest was performed during intravenous contrast administration. CONTRAST:  75 mL OMNIPAQUE IOHEXOL 300 MG/ML  SOLN COMPARISON:  Single-view of the chest 09/20/2016 and 10/19/2015. Plain films right shoulder 09/10/2017. CT chest 02/02/2014. FINDINGS: Cardiovascular: There is mild cardiomegaly. No pericardial effusion. Extensive calcific aortic and coronary atherosclerosis is identified. Large varix right subclavian vein noted. Mediastinum/Nodes:  No axillary, hilar or mediastinal lymphadenopathy. Small to moderate hiatal hernia is noted. Lungs/Pleura: The patient has moderate bilateral pleural effusions. There is associated compressive atelectasis in the posterior aspects of both lower lobes. Patchy airspace disease is identified in the superior segment of the left lower lobe. Upper Abdomen: There is partial visualization of large bilateral renal cysts which appear unchanged. No acute abnormality is identified. Musculoskeletal: The right shoulder is anteriorly dislocated. Marked anterior subluxation of the left shoulder is identified. The patient has severe cervical spondylosis with 0.5 cm anterolisthesis C5 on C6 and severe loss of disc space height at C5-6 and C6-7. The patient has multiple right rib fractures all of which appear late subacute to remote with callus formation identified about the fractures. No acute fracture is seen. IMPRESSION: Anterior dislocation of the right shoulder as seen on comparison plain films 09/10/2017. Multiple remote right rib fractures. No acute right rib fracture is identified. Marked anterior subluxation of the left humeral head on the glenoid consistent with glenohumeral joint instability. Moderate bilateral pleural effusions. There is associated compressive atelectasis. Patchy airspace disease in the superior segment of the left lower lobe could be due to atelectasis but has an appearance worrisome for pneumonia. Extensive coronary atherosclerosis Advanced cervical spondylosis C5-6 and C6-7. Aortic Atherosclerosis (ICD10-I70.0). Electronically Signed   By: Inge Rise M.D.   On: 09/21/2017 15:49   Dg Chest Portable 1 View  Result Date: 09/20/2017 CLINICAL DATA:  Back pain.  Hypertension history of pneumonia. EXAM: PORTABLE CHEST 1 VIEW COMPARISON:  10/19/2015 FINDINGS: Obscuration of the left hemidiaphragm with left mid lung airspace opacity and potential left basilar airspace opacity. Suspected left pleural  effusion with density peripherally in the left chest. The patient is rotated to the left on today's radiograph, reducing diagnostic sensitivity and specificity. Multiple healing right rib fractures. Peripheral interstitial accentuation in the right upper lobe. Difficult to assess the heart due to obscuration of cardiac margins and leftward rotation. Left glenohumeral joint appears dislocated. Possible right medial basilar airspace opacity. IMPRESSION: 1. Marked leftward rotation reduces diagnostic sensitivity. 2.  Large left pleural effusion with left basilar and mid lung airspace opacities which could be from pneumonia or atelectasis. 3. Peripheral confluent interstitial opacity in the right upper lobe could reflect atypical infectious process but is technically nonspecific. 4. Multiple interval rib fractures since the prior exam from 10/19/2015 on the right, with callus formation from healing response. 5. Dislocated right glenohumeral joint, as on recent shoulder radiographs. 6. Chest CT could be utilized to further characterize the abnormalities in the chest, if clinically warranted. Electronically Signed   By: Van Clines M.D.   On: 09/20/2017 13:07   US Thoracentesis Asp Pleural Space W/img Guide  Result Date: 09/28/2017 INDICATION: Pneumonia, dyspnea, bilateral pleural effusions. Request made for therapeutic right thoracentesis. EXAM: ULTRASOUND GUIDED THERAPEUTIC RIGHT THORACENTESIS MEDICATIONS: None COMPLICATIONS: None immediate. PROCEDURE: An ultrasound guided thoracentesis was thoroughly discussed with the patient/patient's son and questions answered. The benefits, risks, alternatives and complications were also discussed. The patient understands and wishes to proceed with the procedure. Written consent was obtained. Ultrasound was performed to localize and mark an adequate pocket of fluid in the right chest. The area was then prepped and draped in the normal sterile fashion. 1% Lidocaine was  used for local anesthesia. Under ultrasound guidance a 6 Fr Safe-T-Centesis catheter was introduced. Thoracentesis was performed. The catheter was removed and a dressing applied. FINDINGS: A total of approximately 750 cc of yellow fluid was removed. IMPRESSION: Successful ultrasound guided therapeutic right thoracentesis yielding 750 cc of pleural fluid. Follow-up chest x-ray revealed no pneumothorax. Read by: Rowe Robert, PA-C Electronically Signed   By: Lucrezia Europe M.D.   On: 09/28/2017 13:15     CBC Recent Labs  Lab 09/22/17 0553 09/23/17 0614 09/26/17 0509  WBC 4.3 5.9 5.1  HGB 8.2* 8.5* 9.4*  HCT 27.4* 28.2* 31.2*  PLT 149* 138* 155  MCV 92.3 91.6 90.2  MCH 27.6 27.6 27.2  MCHC 29.9* 30.1 30.1  RDW 17.3* 17.1* 17.4*    Chemistries  Recent Labs  Lab 09/22/17 0553 09/23/17 0614 09/26/17 0509  NA 141 140 141  K 4.3 3.9 3.7  CL 114* 109 109  CO2 21* 25 22  GLUCOSE 89 90 84  BUN 25* 25* 25*  CREATININE 1.04 1.03 0.83  CALCIUM 7.3* 7.4* 7.7*   ------------------------------------------------------------------------------------------------------------------ No results for input(s): CHOL, HDL, LDLCALC, TRIG, CHOLHDL, LDLDIRECT in the last 72 hours.  No results found for: HGBA1C ------------------------------------------------------------------------------------------------------------------ No results for input(s): TSH, T4TOTAL, T3FREE, THYROIDAB in the last 72 hours.  Invalid input(s): FREET3 ------------------------------------------------------------------------------------------------------------------ No results for input(s): VITAMINB12, FOLATE, FERRITIN, TIBC, IRON, RETICCTPCT in the last 72 hours.  Coagulation profile No results for input(s): INR, PROTIME in the last 168 hours.  No results for input(s): DDIMER in the last 72 hours.  Cardiac Enzymes No results for input(s): CKMB, TROPONINI, MYOGLOBIN in the last 168 hours.  Invalid input(s):  CK ------------------------------------------------------------------------------------------------------------------    Component Value Date/Time   BNP 241.4 (H) 09/22/2017 2751    Roxan Hockey M.D on 09/28/2017 at 5:38 PM  Between 7am to 7pm - Pager - [phone]  After 7pm go to [email or web address] - password [email or web address]  [facility] -  Office  [phone]   Voice Recognition Viviann Spare dictation system was used to create this note, attempts have been made to correct errors. Please contact the author with questions and/or clarifications.

## 2017-09-28 NOTE — Care Management Important Message (Signed)
Important Message  Patient Details  Name: JARVIN OGREN MRN: [id number] Date of Birth: 1930/11/19   Medicare Important Message Given:  Yes    Kerin Salen 09/28/2017, 11:53 AMImportant Message  Patient Details  Name: NOA CONSTANTE MRN: [id number] Date of Birth: [birth year]   Medicare Important Message Given:  Yes    Kerin Salen 09/28/2017, 11:53 AM

## 2017-09-28 NOTE — Progress Notes (Signed)
PT Cancellation Note  Patient Details Name: Derrick Sanchez MRN: [id number] DOB: 1931-05-02   Cancelled Treatment:     Pt out of room for procedure this morning.  Pt has been evaluated with rec for SNF.   Rica Koyanagi  PTA [facility]  Acute  Rehab Pager      [phone]

## 2017-09-28 NOTE — Procedures (Signed)
Ultrasound-guided  therapeutic right thoracentesis performed yielding 750 cc of yellow fluid. No immediate complications. Follow-up chest x-ray pending.

## 2017-09-28 NOTE — Progress Notes (Addendum)
Provided SNF bed offers to pt's son- is reviewing and will call back with selection. CSW will investigate which facilities who made offers have available beds.  Initiated [facility] [facility] [employer].  Sharren Bridge, MSW, LCSW Clinical Social Work 09/28/2017 [phone]  15:30-Son selects [facility] SNF. Confirmed bed availability with admissions and updated insurance auth request with desired facility.

## 2017-09-28 NOTE — Progress Notes (Signed)
Occupational Therapy Treatment Patient Details Name: Derrick Sanchez MRN: [id number] DOB: 1930-06-25 Today's Date: 09/28/2017    History of present illness Derrick Sanchez is a 84 y.o. male with history significant for dysphagia, discitis and osteomyelitis who 2 weeks ago suffered a fall while transferring himself from his motorized scooter into the bed.   presented 09/20/17  right-sided chest pain , cough productive He is found to have  multiple rib fractures on the right, right uppr lobe pneumonia,  CT scan of the chest was done which showed moderate bilateral pleural effusion.     OT comments  Called son and answered questions for OT  Follow Up Recommendations  SNF          Precautions / Restrictions Precautions Precautions: Fall Restrictions Weight Bearing Restrictions: No       Mobility Bed Mobility              total A    Transfers       NT                  ADL either performed or assessed with clinical judgement   ADL Overall ADL's : Needs assistance/impaired Eating/Feeding: Moderate assistance;Bed level Eating/Feeding Details (indicate cue type and reason): using red built up handle Grooming: Moderate assistance;Bed level Grooming Details (indicate cue type and reason): washing face                               General ADL Comments: RN requested OT call son to answer questions.  OT did call Derrick Sanchez and update him on pts progress with OT.                Cognition Arousal/Alertness: Awake/alert Behavior During Therapy: WFL for tasks assessed/performed Overall Cognitive Status: Within Functional Limits for tasks assessed                                                     Pertinent Vitals/ Pain       Pain Score: 3  Pain Location: back Pain Descriptors / Indicators: Discomfort Pain Intervention(s): Monitored during session;Limited activity within patient's tolerance         Frequency  Min 2X/week         Progress Toward Goals  OT Goals(current goals can now be found in the care plan section)  Progress towards OT goals: Progressing toward goals     Plan Discharge plan remains appropriate    Co-evaluation                 AM-PAC PT "6 Clicks" Daily Activity     Outcome Measure   Help from another person eating meals?: A Lot Help from another person taking care of personal grooming?: A Lot Help from another person toileting, which includes using toliet, bedpan, or urinal?: Total Help from another person bathing (including washing, rinsing, drying)?: Total Help from another person to put on and taking off regular upper body clothing?: A Lot Help from another person to put on and taking off regular lower body clothing?: Total 6 Click Score: 9    End of Session    OT Visit Diagnosis: Muscle weakness (generalized) (M62.81);History of falling (Z91.81)   Activity Tolerance Patient tolerated treatment well   Patient Left in  bed;with call bell/phone within reach   Nurse Communication Mobility status;Need for lift equipment        Time: 0946-1000 OT Time Calculation (min): 14 min  Charges: OT General Charges $OT Visit: 1 Visit OT Treatments $Self Care/Home Management : 8-22 mins  Derrick Sanchez, Derrick Sanchez   Derrick Sanchez 09/28/2017, 10:19 AM

## 2017-09-29 ENCOUNTER — Inpatient Hospital Stay (HOSPITAL_COMMUNITY): Payer: PPO

## 2017-09-29 DIAGNOSIS — R110 Nausea: Secondary | ICD-10-CM | POA: Diagnosis not present

## 2017-09-29 DIAGNOSIS — H4010X0 Unspecified open-angle glaucoma, stage unspecified: Secondary | ICD-10-CM | POA: Diagnosis not present

## 2017-09-29 DIAGNOSIS — Z7189 Other specified counseling: Secondary | ICD-10-CM | POA: Diagnosis not present

## 2017-09-29 DIAGNOSIS — K219 Gastro-esophageal reflux disease without esophagitis: Secondary | ICD-10-CM | POA: Diagnosis not present

## 2017-09-29 DIAGNOSIS — G4733 Obstructive sleep apnea (adult) (pediatric): Secondary | ICD-10-CM | POA: Diagnosis not present

## 2017-09-29 DIAGNOSIS — M4640 Discitis, unspecified, site unspecified: Secondary | ICD-10-CM | POA: Diagnosis not present

## 2017-09-29 DIAGNOSIS — I10 Essential (primary) hypertension: Secondary | ICD-10-CM | POA: Diagnosis not present

## 2017-09-29 DIAGNOSIS — Z96652 Presence of left artificial knee joint: Secondary | ICD-10-CM | POA: Diagnosis not present

## 2017-09-29 DIAGNOSIS — J9621 Acute and chronic respiratory failure with hypoxia: Secondary | ICD-10-CM | POA: Diagnosis not present

## 2017-09-29 DIAGNOSIS — J90 Pleural effusion, not elsewhere classified: Secondary | ICD-10-CM | POA: Diagnosis not present

## 2017-09-29 DIAGNOSIS — R0600 Dyspnea, unspecified: Secondary | ICD-10-CM | POA: Diagnosis not present

## 2017-09-29 DIAGNOSIS — J181 Lobar pneumonia, unspecified organism: Secondary | ICD-10-CM | POA: Diagnosis not present

## 2017-09-29 DIAGNOSIS — E43 Unspecified severe protein-calorie malnutrition: Secondary | ICD-10-CM | POA: Diagnosis not present

## 2017-09-29 DIAGNOSIS — R319 Hematuria, unspecified: Secondary | ICD-10-CM | POA: Diagnosis not present

## 2017-09-29 DIAGNOSIS — Z79899 Other long term (current) drug therapy: Secondary | ICD-10-CM | POA: Diagnosis not present

## 2017-09-29 DIAGNOSIS — Z86718 Personal history of other venous thrombosis and embolism: Secondary | ICD-10-CM | POA: Diagnosis not present

## 2017-09-29 DIAGNOSIS — Z681 Body mass index (BMI) 19 or less, adult: Secondary | ICD-10-CM | POA: Diagnosis not present

## 2017-09-29 DIAGNOSIS — R52 Pain, unspecified: Secondary | ICD-10-CM | POA: Diagnosis not present

## 2017-09-29 DIAGNOSIS — R0682 Tachypnea, not elsewhere classified: Secondary | ICD-10-CM | POA: Diagnosis not present

## 2017-09-29 DIAGNOSIS — L89159 Pressure ulcer of sacral region, unspecified stage: Secondary | ICD-10-CM | POA: Diagnosis not present

## 2017-09-29 DIAGNOSIS — Z96641 Presence of right artificial hip joint: Secondary | ICD-10-CM | POA: Diagnosis not present

## 2017-09-29 DIAGNOSIS — S2241XA Multiple fractures of ribs, right side, initial encounter for closed fracture: Secondary | ICD-10-CM | POA: Diagnosis not present

## 2017-09-29 DIAGNOSIS — Z9889 Other specified postprocedural states: Secondary | ICD-10-CM | POA: Diagnosis not present

## 2017-09-29 DIAGNOSIS — Z9189 Other specified personal risk factors, not elsewhere classified: Secondary | ICD-10-CM | POA: Diagnosis not present

## 2017-09-29 DIAGNOSIS — R5383 Other fatigue: Secondary | ICD-10-CM | POA: Diagnosis not present

## 2017-09-29 DIAGNOSIS — J9601 Acute respiratory failure with hypoxia: Secondary | ICD-10-CM | POA: Diagnosis not present

## 2017-09-29 DIAGNOSIS — M6281 Muscle weakness (generalized): Secondary | ICD-10-CM | POA: Diagnosis not present

## 2017-09-29 DIAGNOSIS — Z66 Do not resuscitate: Secondary | ICD-10-CM | POA: Diagnosis not present

## 2017-09-29 DIAGNOSIS — Y95 Nosocomial condition: Secondary | ICD-10-CM | POA: Diagnosis present

## 2017-09-29 DIAGNOSIS — I5032 Chronic diastolic (congestive) heart failure: Secondary | ICD-10-CM | POA: Diagnosis not present

## 2017-09-29 DIAGNOSIS — Z9181 History of falling: Secondary | ICD-10-CM | POA: Diagnosis not present

## 2017-09-29 DIAGNOSIS — I5033 Acute on chronic diastolic (congestive) heart failure: Secondary | ICD-10-CM | POA: Diagnosis not present

## 2017-09-29 DIAGNOSIS — D649 Anemia, unspecified: Secondary | ICD-10-CM | POA: Diagnosis not present

## 2017-09-29 DIAGNOSIS — I110 Hypertensive heart disease with heart failure: Secondary | ICD-10-CM | POA: Diagnosis not present

## 2017-09-29 DIAGNOSIS — Z111 Encounter for screening for respiratory tuberculosis: Secondary | ICD-10-CM | POA: Diagnosis not present

## 2017-09-29 DIAGNOSIS — K5900 Constipation, unspecified: Secondary | ICD-10-CM | POA: Diagnosis not present

## 2017-09-29 DIAGNOSIS — I82409 Acute embolism and thrombosis of unspecified deep veins of unspecified lower extremity: Secondary | ICD-10-CM | POA: Diagnosis not present

## 2017-09-29 DIAGNOSIS — I959 Hypotension, unspecified: Secondary | ICD-10-CM | POA: Diagnosis not present

## 2017-09-29 DIAGNOSIS — R05 Cough: Secondary | ICD-10-CM | POA: Diagnosis not present

## 2017-09-29 DIAGNOSIS — R0902 Hypoxemia: Secondary | ICD-10-CM | POA: Diagnosis not present

## 2017-09-29 DIAGNOSIS — Z86010 Personal history of colonic polyps: Secondary | ICD-10-CM | POA: Diagnosis not present

## 2017-09-29 DIAGNOSIS — R1310 Dysphagia, unspecified: Secondary | ICD-10-CM | POA: Diagnosis not present

## 2017-09-29 DIAGNOSIS — S81802A Unspecified open wound, left lower leg, initial encounter: Secondary | ICD-10-CM | POA: Diagnosis not present

## 2017-09-29 DIAGNOSIS — S2241XD Multiple fractures of ribs, right side, subsequent encounter for fracture with routine healing: Secondary | ICD-10-CM | POA: Diagnosis not present

## 2017-09-29 DIAGNOSIS — M4620 Osteomyelitis of vertebra, site unspecified: Secondary | ICD-10-CM | POA: Diagnosis not present

## 2017-09-29 DIAGNOSIS — M545 Low back pain: Secondary | ICD-10-CM | POA: Diagnosis not present

## 2017-09-29 DIAGNOSIS — H40059 Ocular hypertension, unspecified eye: Secondary | ICD-10-CM | POA: Diagnosis not present

## 2017-09-29 DIAGNOSIS — S2249XA Multiple fractures of ribs, unspecified side, initial encounter for closed fracture: Secondary | ICD-10-CM | POA: Diagnosis not present

## 2017-09-29 DIAGNOSIS — J690 Pneumonitis due to inhalation of food and vomit: Secondary | ICD-10-CM | POA: Diagnosis not present

## 2017-09-29 DIAGNOSIS — M8668 Other chronic osteomyelitis, other site: Secondary | ICD-10-CM | POA: Diagnosis not present

## 2017-09-29 DIAGNOSIS — L89329 Pressure ulcer of left buttock, unspecified stage: Secondary | ICD-10-CM | POA: Diagnosis not present

## 2017-09-29 DIAGNOSIS — W1789XA Other fall from one level to another, initial encounter: Secondary | ICD-10-CM | POA: Diagnosis present

## 2017-09-29 DIAGNOSIS — R627 Adult failure to thrive: Secondary | ICD-10-CM | POA: Diagnosis not present

## 2017-09-29 DIAGNOSIS — Z515 Encounter for palliative care: Secondary | ICD-10-CM | POA: Diagnosis not present

## 2017-09-29 DIAGNOSIS — R0602 Shortness of breath: Secondary | ICD-10-CM | POA: Diagnosis not present

## 2017-09-29 DIAGNOSIS — G8929 Other chronic pain: Secondary | ICD-10-CM | POA: Diagnosis not present

## 2017-09-29 DIAGNOSIS — R296 Repeated falls: Secondary | ICD-10-CM | POA: Diagnosis not present

## 2017-09-29 DIAGNOSIS — J189 Pneumonia, unspecified organism: Secondary | ICD-10-CM | POA: Diagnosis not present

## 2017-09-29 LAB — BASIC METABOLIC PANEL
Anion gap: 6 (ref 5–15)
BUN: 17 mg/dL (ref 6–20)
CHLORIDE: 108 mmol/L (ref 101–111)
CO2: 24 mmol/L (ref 22–32)
Calcium: 7.5 mg/dL — ABNORMAL LOW (ref 8.9–10.3)
Creatinine, Ser: 0.75 mg/dL (ref 0.61–1.24)
GFR calc non Af Amer: >60 mL/min (ref >60)
Glucose, Bld: 78 mg/dL (ref 65–99)
Potassium: 4 mmol/L (ref 3.5–5.1)
SODIUM: 138 mmol/L (ref 135–145)

## 2017-09-29 LAB — CBC
HEMATOCRIT: 28.7 % — AB (ref 39.0–52.0)
HEMOGLOBIN: 8.9 g/dL — AB (ref 13.0–17.0)
MCH: 27.8 pg (ref 26.0–34.0)
MCHC: 31 g/dL (ref 30.0–36.0)
MCV: 89.7 fL (ref 78.0–100.0)
Platelets: 140 10*3/uL — ABNORMAL LOW (ref 150–400)
RBC: 3.2 MIL/uL — ABNORMAL LOW (ref 4.22–5.81)
RDW: 17.9 % — ABNORMAL HIGH (ref 11.5–15.5)
WBC: 5.5 10*3/uL (ref 4.0–10.5)

## 2017-09-29 MED ORDER — AMLODIPINE BESYLATE 5 MG PO TABS
5.0000 mg | ORAL_TABLET | Freq: Every day | ORAL | Status: DC
  Administered 2017-09-29: 5 mg via ORAL
  Filled 2017-09-29: qty 1

## 2017-09-29 MED ORDER — ACETAMINOPHEN 325 MG PO TABS: 650.0000 mg | ORAL_TABLET | Freq: Four times a day (QID) | ORAL | 1 refills | Status: DC | PRN

## 2017-09-29 MED ORDER — AMLODIPINE BESYLATE 5 MG PO TABS: 5.0000 mg | ORAL_TABLET | Freq: Every day | ORAL | 1 refills | Status: DC

## 2017-09-29 MED ORDER — HYDRALAZINE HCL 20 MG/ML IJ SOLN: 10.0000 mg | Freq: Four times a day (QID) | INTRAMUSCULAR | Status: DC | PRN

## 2017-09-29 MED ORDER — OXYCODONE-ACETAMINOPHEN 5-325 MG PO TABS: 1.0000 | ORAL_TABLET | Freq: Four times a day (QID) | ORAL | 0 refills | Status: DC | PRN

## 2017-09-29 MED ORDER — BOOST PLUS PO LIQD: 237.0000 mL | Freq: Three times a day (TID) | ORAL | 1 refills | Status: AC

## 2017-09-29 MED ORDER — ALBUTEROL SULFATE (2.5 MG/3ML) 0.083% IN NEBU: 2.5000 mg | INHALATION_SOLUTION | Freq: Two times a day (BID) | RESPIRATORY_TRACT | 12 refills | Status: DC

## 2017-09-29 MED ORDER — AMLODIPINE BESYLATE 2.5 MG PO TABS: 2.5000 mg | ORAL_TABLET | Freq: Every day | ORAL | 1 refills | Status: DC

## 2017-09-29 MED ORDER — GUAIFENESIN ER 600 MG PO TB12: 600.0000 mg | ORAL_TABLET | Freq: Two times a day (BID) | ORAL | 0 refills | Status: DC

## 2017-09-29 MED ORDER — LEVOFLOXACIN 500 MG PO TABS: 500.0000 mg | ORAL_TABLET | Freq: Every day | ORAL | 1 refills | Status: DC

## 2017-09-29 MED ORDER — BOOST PLUS PO LIQD
237.0000 mL | Freq: Three times a day (TID) | ORAL | Status: DC
  Filled 2017-09-29 (×2): qty 237

## 2017-09-29 MED ORDER — HYDRALAZINE HCL 20 MG/ML IJ SOLN: 10.0000 mg | Freq: Once | INTRAMUSCULAR | Status: DC

## 2017-09-29 MED ORDER — ONDANSETRON HCL 4 MG PO TABS: 4.0000 mg | ORAL_TABLET | Freq: Four times a day (QID) | ORAL | 0 refills | Status: DC | PRN

## 2017-09-29 MED ORDER — FLORANEX PO PACK: 1.0000 g | PACK | Freq: Three times a day (TID) | ORAL | 1 refills | Status: AC

## 2017-09-29 MED ORDER — SENNOSIDES-DOCUSATE SODIUM 8.6-50 MG PO TABS: 2.0000 | ORAL_TABLET | Freq: Every day | ORAL | 1 refills | Status: AC

## 2017-09-29 NOTE — Progress Notes (Signed)
Nutrition Follow-up  DOCUMENTATION CODES:   Underweight, Severe malnutrition in context of chronic illness  INTERVENTION:    Boost Plus chocolate TID- Each supplement provides 360kcal and 14g protein.    Magic cup TID with meals, each supplement provides 290 kcal and 9 grams of protein  NUTRITION DIAGNOSIS:   Severe Malnutrition related to chronic illness, dysphagia as evidenced by severe fat depletion, severe muscle depletion, moderate fat depletion.  Ongoing  GOAL:   Patient will meet greater than or equal to 90% of their needs  Not meeting  MONITOR:   PO intake, Supplement acceptance, Weight trends, Labs  REASON FOR ASSESSMENT:   Consult Assessment of nutrition requirement/status  ASSESSMENT:   Patient with PMH significant for dysphagia, discitis, and osteomyelitis. Presents this admission after a mechanical fall transferring from motorized scooter to his bed. Admitted for CAP and multiple right rib fractures.    4/15- thoracentesis, 750 ml drained  Pt reports his appetite remains poor. He continues to have issues with swallowing and has remained compliant with his diet order. Meal completions charted as 25-50% for his last 7 meals. Pt has not received magic cups during this admission, and wishes to have Boost rather than Ensure. RD to change order and put in nursing care order for magic cups. A recent weight has not been obtained since last RD visit.   Pt to be placed at [facility].   Medications reviewed and include: B-complex with Vit C Labs reviewed.   Diet Order:  DIET - DYS 1 Room service appropriate? Yes; Fluid consistency: Thin  EDUCATION NEEDS:   Education needs have been addressed  Skin:  Skin Assessment: Skin Integrity Issues: Skin Integrity Issues:: Stage I, Unstageable Stage I: right hip Unstageable: left hip, left back  Last BM:  09/27/17  Height:   Ht Readings from Last 1 Encounters:  09/20/17 6\' 2"  (1.88 m)    Weight:   Wt  Readings from Last 1 Encounters:  09/20/17 120 lb (54.4 kg)    Ideal Body Weight:  86.4 kg  BMI:  Body mass index is 15.41 kg/m.  Estimated Nutritional Needs:   Kcal:  2100-2300 kcal  Protein:  105-115 g  Fluid:  >2.1 L/day    Mariana Single RD, LDN Clinical Nutrition Pager # - [phone]

## 2017-09-29 NOTE — Discharge Summary (Addendum)
Derrick Sanchez, is a 84 y.o. male  DOB [birth year]  MRN [id number].  Admission date:  09/20/2017  Admitting Physician  Erline Hau, MD  Discharge Date:  09/29/2017   Primary MD  [name], MD  Recommendations for primary care physician for things to follow:  1)Take Levaquin  500 mg daily for Diskitis  2)Take Lactobacillus/Probiotic as prescribed   Admission Diagnosis  Hypoxia [R09.02] Closed fracture of multiple ribs of right side, initial encounter [S22.41XA] Chronic low back pain without sciatica, unspecified back pain laterality [M54.5, G89.29] Community acquired pneumonia of right upper lobe of lung ([place]) [J18.1]   Discharge Diagnosis  Hypoxia [R09.02] Closed fracture of multiple ribs of right side, initial encounter [S22.41XA] Chronic low back pain without sciatica, unspecified back pain laterality [M54.5, G89.29] Community acquired pneumonia of right upper lobe of lung ([place]) [J18.1]    Active Problems:   Goals of care, counseling/discussion   CAP (community acquired pneumonia)   Acute on chronic respiratory failure with hypoxia (HCC)   Malnutrition ([place])   FTT (failure to thrive) in adult   Pressure injury of skin   Multiple closed fractures of ribs of right side   Pleural effusion   Palliative care by specialist   Hypoxia   At high risk for aspiration      Past Medical History:  Diagnosis Date  . Allergy   . Arthritis   . Blood transfusion   . Candidiasis of the esophagus 07-17-2000   EGD  . Cataract    bil eyes  . Colon polyps 05-27-2002   Tubulovillous Adenoma-Colonoscopy  . Diverticulosis of colon (without mention of hemorrhage) 2008,2006,2003   Colonoscopy  . Esophageal reflux 2008   EGD  . Glaucoma   . Hiatal hernia 2002,2003,2013   EGD  . Hyperparathyroidism, unspecified ([place])   . Hypertension   . Internal hemorrhoids without mention of complication  0177   Colonoscopy  . Multiple falls    Pt falls asleep without warning and has encounter multiple falls  . Obstructive sleep apnea (adult) (pediatric)   . Other vitamin B12 deficiency anemia   . Pneumonia    years ago  . Restless legs syndrome (RLS)   . Stricture and stenosis of esophagus 05-27-2002   EGD  . Unspecified asthma(493.90)     Past Surgical History:  Procedure Laterality Date  . arm surgery    . CATARACT EXTRACTION Bilateral   . COLONOSCOPY    . EYE SURGERY Bilateral    cataracts iol  . HIP SURGERY Right 1998   replacement  . LUMBAR FUSION  x 3   most recent June 2016  . LUMBAR LAMINECTOMY/ DECOMPRESSION WITH MET-RX N/A 11/23/2014   Procedure: HARDWARE REMOVAL WITH MET-RX;  Surgeon: Jovita Gamma, MD;  Location: [facility];  Service: Neurosurgery;  Laterality: N/A;  removal of lumbar posterior instrumentation  . LUMBAR WOUND DEBRIDEMENT N/A 03/12/2014   Procedure: LUMBAR WOUND DEBRIDEMENT;  Surgeon: Elaina Hoops, MD;  Location: [facility];  Service: Neurosurgery;  Laterality: N/A;  LUMBAR WOUND DEBRIDEMENT  . MASS EXCISION  10/14/2011   Procedure: EXCISION MASS;  Surgeon: Wynonia Sours, MD;  Location: [facility];  Service: Orthopedics;  Laterality: Left;   left hand  . PARATHYROIDECTOMY  2008  . PICC LINE PLACE PERIPHERAL ([place] [facility]X) Left   . TOTAL KNEE ARTHROPLASTY Left 1998   replacement     HPI  from the history and physical done on the day of admission:    Chief Complaint: Right-sided chest pain, cough, congestion  HPI: Derrick Sanchez is a 84 y.o. male with history significant for dysphagia, discitis and osteomyelitis requiring long-term antibiotics now on daily suppression with Levaquin who 2 weeks ago suffered a fall while transferring himself from his motorized scooter into the bed.  He fell on his right side and had immediate right-sided chest pain.  Sometime thereafter he visited Dr. Christy Sartorius who is his neurosurgeon who imaged his back, son  states there was no mention of rib fractures at that time.  He comes in today because that right-sided chest pain has continued despite oxycodone, he has now developed a cough productive of yellow sputum and congestion.  Denies fever or chills.  In the ED he is found to have on chest x-ray multiple rib fractures on the right, right upper lobe pneumonia as well as a large left pleural effusion with midlung airspace opacities which could represent pneumonia.  Initial blood pressure was a little low with systolics in the 09O but has responded well to some IV fluids, he is not febrile, blood work is essentially unremarkable, of note WBC count is 6.3 and lactic acid is normal at 1.8.  Admission has been requested     Hospital Course:   Brief History/Interval Summary: Derrick Sanchez is a 84 y.o. male with history significant for dysphagia, discitis and osteomyelitis requiring long-term antibiotics now on daily suppression with Levaquin who 2 weeks ago suffered a fall while transferring himself from his motorized scooter into the bed. He fell on his right side and had immediate right-sided chest pain. Sometime thereafter he visited Dr. Carloyn Manner who is his neurosurgeon who imaged his back. Son states there was no mention of rib fractures at that time. He presented because that right-sided chest pain has continued despite oxycodone. He developed a cough productive of yellow sputum and congestion. In the ED he is found to have on chest x-ray multiple rib fractures on the right, right upper lobe pneumonia as well as a large left pleural effusion with midlung airspace opacities which could represent pneumonia. Initial blood pressure was a little low with systolics in the 70J but has responded well to some IV fluids, he is not febrile, blood work is essentially unremarkable, of note WBC count is 6.3 and lactic acid is normal at 1.8. Patient was hospitalized for further management. CT scan of the chest was done which  showed moderate bilateral pleural effusion. Palliative medicine was consulted.  Status post right sided thoracentesis on 09/28/2017 with removal of 750 mL of fluid, discharge to skilled nursing facility patient's son/POA request DNR status   Plan:- 1)Community-Acquired Pneumonia with concern for aspiration as well/acute respiratory failure with hypoxia-overall improving from a respiratory standpoint, continues to require 3-4 L of oxygen via nasal cannula.  patient is on Levaquin chronically for history of discitis. Patient was treated with Zosyn after discussions with Dr. Johnnye Sima,  Zosyn switched to Augmentin. Transition back to Levaquin 500 mg daily upon discharge for discitis rather  than pneumonia . speech and swallow evaluation consult appreciated,High suspicion of aspiration, patient has had extensive imaging and ordered speech workup recently, continue modified diet .  Patient should be at least 45 degree angle when eating  2)Bilateral Pleural Effusion-- Rt > Lt, clinically and radiologically improved , Status post right sided thoracentesis on 09/28/2017 with removal of 750 mL of fluid this is most likely contributing to acute hypoxic respiratory failure, echocardiogram with EF of 60-65%,   repeat chest x-ray on 09/29/2017 with some residual pleural effusion with atelectasis and possible infiltrates , however patient has no fevers no leukocytosis and no increased oxygen requirement , so suspect chest x-ray findings are probably secondary to compressive atelectasis or other than frank new infiltrates  3)History of Left Lower Extremity DVT Diagnosed in 07/2016 and was on Xarelto. restart  Xarelto as per patient's son/POA    4)History of Vertebral Osteomyelitis/Discitis-He is on chronic suppressive treatment with Levaquin.  Treated with Zosyn as recommended by Dr. Johnnye Sima, switched to  Augmentin on 09/24/17, d/c  back on Levaquin 500 mg daily for discitis   5)Generalized weakness and  deconditioning- PT eval appreciated ,  OT eval appreciated recommend skilled nursing facility for rehab  6)Lower extremity wounds- c/n Local Wound care   7)Normocytic Anemia Hemoglobin slightly low today compared to yesterday. No evidence of overt bleeding. Likely dilutional drop.   8)Goals of care-discussed with palliative care team who spoke with patient and son, , p.  Plan of care discussed with patient's son [name], patient's son/POA request DNR status  9)Disposition-  skilled nursing facility placement,   10)FEN-tolerating pured diet well overall, use supplements  11)HTN-give amlodipine 2.5 mg daily    DVT Prophylaxis: On Xarelto at home which has been held for now  Code Status: patient's son/POA request DNR status Family Communication: Discussed with patient and his son Disposition Plan:  SNF  Consultants:Palliative medicine  Procedures:Status post right sided thoracentesis on 09/28/2017 with removal of 750 mL of fluid Antibiotics: Zosyn stopped 09/24/17  Start Augmentin on 09/24/17 D/c on Levaquin  DVT Prophylaxis  : Patient's Son/POA wants Xarelto restarted   Discharge Condition: Stable  Follow UP  Contact information for after-discharge care    Destination    [facility] .   Service:  Skilled Nursing Contact information: [address] [place] [phone]               Diet and Activity recommendation:  As advised  Discharge Instructions    Discharge Instructions    ([place]) Call MD:  Anytime you have any of the following symptoms: 1) 3 pound weight gain in 24 hours or 5 pounds in 1 week 2) shortness of breath, with or without a dry hacking cough 3) swelling in the hands, feet or stomach 4) if you have to sleep on extra pillows at night in order to breathe.   Complete by:  As directed    Call MD for:  difficulty breathing, headache or visual disturbances   Complete by:  As directed      Call MD for:  persistant dizziness or light-headedness   Complete by:  As directed    Call MD for:  persistant nausea and vomiting   Complete by:  As directed    Call MD for:  severe uncontrolled pain   Complete by:  As directed    Call MD for:  temperature >100.4   Complete by:  As directed    Diet -  low sodium heart healthy   Complete by:  As directed    Discharge instructions   Complete by:  As directed    1)Take Levaquin  500 mg daily for Diskitis  2)Take Lactobacillus/Probiotic as prescribed   Increase activity slowly   Complete by:  As directed        Discharge Medications     Allergies as of 09/29/2017   No Known Allergies     Medication List    TAKE these medications   acetaminophen 325 MG tablet Commonly known as:  TYLENOL Take 2 tablets (650 mg total) by mouth every 6 (six) hours as needed for mild pain (or Fever >/= 101).   amLODipine 2.5 MG tablet Commonly known as:  NORVASC Take 1 tablet (2.5 mg total) by mouth daily.   B-complex with vitamin C tablet Take 1 tablet by mouth daily.   bimatoprost 0.01 % Soln Commonly known as:  LUMIGAN Place 1 drop into both eyes at bedtime.   ferrous sulfate 325 (65 FE) MG tablet Take 1 tablet by mouth daily.   gabapentin 300 MG capsule Commonly known as:  NEURONTIN Take 300-600 mg by mouth 3 (three) times daily. Take 2 capsules (600 mg) every morning, 1 capsule (300 mg) at noon, 2 capsules (600 mg) at bedtime   guaiFENesin 600 MG 12 hr tablet Commonly known as:  MUCINEX Take 1 tablet (600 mg total) by mouth 2 (two) times daily for 10 days.   lactobacillus Pack Take 1 packet (1 g total) by mouth 3 (three) times daily with meals.   lactose free nutrition Liqd Take 237 mLs by mouth 3 (three) times daily between meals.   levofloxacin 500 MG tablet Commonly known as:  LEVAQUIN Take 1 tablet (500 mg total) by mouth daily.   NEXIUM 40 MG capsule Generic drug:  esomeprazole Take 1 capsule (40 mg total) by  mouth daily at 12 noon. This was filled by Dr [name]   ondansetron 4 MG tablet Commonly known as:  ZOFRAN Take 1 tablet (4 mg total) by mouth every 6 (six) hours as needed for nausea.   oxyCODONE-acetaminophen 5-325 MG tablet Commonly known as:  PERCOCET Take 1 tablet by mouth every 6 (six) hours as needed for moderate pain.   pramipexole 1 MG tablet Commonly known as:  MIRAPEX Take 2 mg by mouth 3 (three) times daily.   PROVENTIL HFA 108 (90 Base) MCG/ACT inhaler Generic drug:  albuterol Inhale 2 puffs into the lungs every 6 (six) hours as needed for wheezing. What changed:  Another medication with the same name was added. Make sure you understand how and when to take each.   albuterol (2.5 MG/3ML) 0.083% nebulizer solution Commonly known as:  PROVENTIL Take 3 mLs (2.5 mg total) by nebulization 2 (two) times daily. What changed:  You were already taking a medication with the same name, and this prescription was added. Make sure you understand how and when to take each.   senna-docusate 8.6-50 MG tablet Commonly known as:  Senokot-S Take 2 tablets by mouth at bedtime.   traZODone 50 MG tablet Commonly known as:  DESYREL Take 25-50 mg by mouth at bedtime as needed for sleep.   XARELTO 20 MG Tabs tablet Generic drug:  rivaroxaban Take 20 mg by mouth daily.       Major procedures and Radiology Reports - PLEASE review detailed and final reports for all details, in brief -    Dg Chest 1 View  Result Date:  09/28/2017 CLINICAL DATA:  Post right thoracentesis EXAM: CHEST  1 VIEW COMPARISON:  For 14 2019 FINDINGS: Significant improvement in right pleural effusion which is now very small. No pneumothorax. Improved aeration right lung base Moderate left effusion and left lower lobe airspace disease remain unchanged. Negative for heart failure or edema. Chronic right rib fractures. Chronic bilateral shoulder subluxation. IMPRESSION: Improvement in right pleural effusion without  pneumothorax No interval change in left pleural effusion and left lower lobe airspace disease. Electronically Signed   By: Franchot Gallo M.D.   On: 09/28/2017 13:03   Dg Chest 2 View  Result Date: 09/29/2017 CLINICAL DATA:  Shortness of breath. EXAM: CHEST - 2 VIEW COMPARISON:  09/28/2017. FINDINGS: Mediastinum hilar structures normal. Heart size normal. Persistent left base atelectasis/infiltrate and left pleural effusion with slight improvement from prior exam. New onset of right base atelectasis/infiltrate. Probable right pleural effusion. No pneumothorax. Heart size stable. Deformity of right ribs again noted most likely from old injury. Surgical clips upper chest. IMPRESSION: 1. Persistent left base atelectasis/infiltrate left-sided pleural effusion with slight improvement from prior exam. 2. New onset right base atelectasis/infiltrate. Probable right-sided pleural effusion. Electronically Signed   By: Marcello Moores  Register   On: 09/29/2017 09:52   Dg Chest 2 View  Result Date: 09/27/2017 CLINICAL DATA:  Shortness of breath. EXAM: CHEST - 2 VIEW COMPARISON:  Chest x-rays dated 09/24/2017 and 09/20/2017. chest CT dated 09/21/2017. FINDINGS: Bibasilar opacities are unchanged, compatible with effusions and atelectasis demonstrated on earlier chest CT. Upper lungs are now relatively clear. No pneumothorax seen. Heart size and mediastinal contours are stable. Again noted is dislocation of the RIGHT humeral head. No acute or suspicious osseous finding. IMPRESSION: 1. Stable bibasilar opacities, compatible with the pleural effusions and associated atelectasis demonstrated on earlier chest CT. No new lung findings. 2. RIGHT humeral head dislocation, as described on multiple prior reports. Electronically Signed   By: Franki Cabot M.D.   On: 09/27/2017 09:36   Dg Chest 2 View  Result Date: 09/24/2017 CLINICAL DATA:  Shortness of breath, weakness EXAM: CHEST - 2 VIEW COMPARISON:  09/20/2017; correlation interval  CT chest 09/21/2017 FINDINGS: Enlargement of cardiac silhouette with vascular congestion. Atherosclerotic calcification aorta. Bibasilar effusions. Atelectasis versus consolidation LEFT lower lobe. Improved upper lobe infiltrates more so on LEFT with residual opacity in the periphery of the RIGHT upper lobe. Central peribronchial thickening. No pneumothorax. Bones demineralized. IMPRESSION: Bibasilar effusions and atelectasis. Bronchitic changes with improved upper lobe infiltrates. Electronically Signed   By: Lavonia Dana M.D.   On: 09/24/2017 09:15   Ct Chest W Contrast  Result Date: 09/21/2017 CLINICAL DATA:  The patient suffered a fall transferring from a motorized scooter to bed 2 weeks ago with a blow to the right side and onset of right chest pain. Initial encounter. EXAM: CT CHEST WITH CONTRAST TECHNIQUE: Multidetector CT imaging of the chest was performed during intravenous contrast administration. CONTRAST:  75 mL OMNIPAQUE IOHEXOL 300 MG/ML  SOLN COMPARISON:  Single-view of the chest 09/20/2016 and 10/19/2015. Plain films right shoulder 09/10/2017. CT chest 02/02/2014. FINDINGS: Cardiovascular: There is mild cardiomegaly. No pericardial effusion. Extensive calcific aortic and coronary atherosclerosis is identified. Large varix right subclavian vein noted. Mediastinum/Nodes: No axillary, hilar or mediastinal lymphadenopathy. Small to moderate hiatal hernia is noted. Lungs/Pleura: The patient has moderate bilateral pleural effusions. There is associated compressive atelectasis in the posterior aspects of both lower lobes. Patchy airspace disease is identified in the superior segment of the left lower lobe. Upper  Abdomen: There is partial visualization of large bilateral renal cysts which appear unchanged. No acute abnormality is identified. Musculoskeletal: The right shoulder is anteriorly dislocated. Marked anterior subluxation of the left shoulder is identified. The patient has severe cervical  spondylosis with 0.5 cm anterolisthesis C5 on C6 and severe loss of disc space height at C5-6 and C6-7. The patient has multiple right rib fractures all of which appear late subacute to remote with callus formation identified about the fractures. No acute fracture is seen. IMPRESSION: Anterior dislocation of the right shoulder as seen on comparison plain films 09/10/2017. Multiple remote right rib fractures. No acute right rib fracture is identified. Marked anterior subluxation of the left humeral head on the glenoid consistent with glenohumeral joint instability. Moderate bilateral pleural effusions. There is associated compressive atelectasis. Patchy airspace disease in the superior segment of the left lower lobe could be due to atelectasis but has an appearance worrisome for pneumonia. Extensive coronary atherosclerosis Advanced cervical spondylosis C5-6 and C6-7. Aortic Atherosclerosis (ICD10-I70.0). Electronically Signed   By: Inge Rise M.D.   On: 09/21/2017 15:49   Dg Chest Portable 1 View  Result Date: 09/20/2017 CLINICAL DATA:  Back pain.  Hypertension history of pneumonia. EXAM: PORTABLE CHEST 1 VIEW COMPARISON:  10/19/2015 FINDINGS: Obscuration of the left hemidiaphragm with left mid lung airspace opacity and potential left basilar airspace opacity. Suspected left pleural effusion with density peripherally in the left chest. The patient is rotated to the left on today's radiograph, reducing diagnostic sensitivity and specificity. Multiple healing right rib fractures. Peripheral interstitial accentuation in the right upper lobe. Difficult to assess the heart due to obscuration of cardiac margins and leftward rotation. Left glenohumeral joint appears dislocated. Possible right medial basilar airspace opacity. IMPRESSION: 1. Marked leftward rotation reduces diagnostic sensitivity. 2. Large left pleural effusion with left basilar and mid lung airspace opacities which could be from pneumonia or  atelectasis. 3. Peripheral confluent interstitial opacity in the right upper lobe could reflect atypical infectious process but is technically nonspecific. 4. Multiple interval rib fractures since the prior exam from 10/19/2015 on the right, with callus formation from healing response. 5. Dislocated right glenohumeral joint, as on recent shoulder radiographs. 6. Chest CT could be utilized to further characterize the abnormalities in the chest, if clinically warranted. Electronically Signed   By: Van Clines M.D.   On: 09/20/2017 13:07   US Thoracentesis Asp Pleural Space W/img Guide  Result Date: 09/28/2017 INDICATION: Pneumonia, dyspnea, bilateral pleural effusions. Request made for therapeutic right thoracentesis. EXAM: ULTRASOUND GUIDED THERAPEUTIC RIGHT THORACENTESIS MEDICATIONS: None COMPLICATIONS: None immediate. PROCEDURE: An ultrasound guided thoracentesis was thoroughly discussed with the patient/patient's son and questions answered. The benefits, risks, alternatives and complications were also discussed. The patient understands and wishes to proceed with the procedure. Written consent was obtained. Ultrasound was performed to localize and mark an adequate pocket of fluid in the right chest. The area was then prepped and draped in the normal sterile fashion. 1% Lidocaine was used for local anesthesia. Under ultrasound guidance a 6 Fr Safe-T-Centesis catheter was introduced. Thoracentesis was performed. The catheter was removed and a dressing applied. FINDINGS: A total of approximately 750 cc of yellow fluid was removed. IMPRESSION: Successful ultrasound guided therapeutic right thoracentesis yielding 750 cc of pleural fluid. Follow-up chest x-ray revealed no pneumothorax. Read by: Rowe Robert, PA-C Electronically Signed   By: Lucrezia Europe M.D.   On: 09/28/2017 13:15    Micro Results    Recent Results (from the past  240 hour(s))  Blood culture (routine x 2)     Status: None   Collection  Time: 09/20/17  1:25 PM  Result Value Ref Range Status   Specimen Description LEFT ANTECUBITAL  Final   Special Requests   Final    BOTTLES DRAWN AEROBIC AND ANAEROBIC Blood Culture adequate volume   Culture   Final    NO GROWTH 5 DAYS Performed at [facility], [address], [place], [place] [address]    Report Status 09/25/2017 FINAL  Final  Blood culture (routine x 2)     Status: None   Collection Time: 09/20/17  1:32 PM  Result Value Ref Range Status   Specimen Description BLOOD RIGHT HAND  Final   Special Requests   Final    BOTTLES DRAWN AEROBIC AND ANAEROBIC Blood Culture adequate volume   Culture   Final    NO GROWTH 5 DAYS Performed at [facility], [address] [place], [place] [address]    Report Status 09/25/2017 FINAL  Final       Today   Subjective    [name] today has no new complaints, eating ok,           Patient has been seen and examined prior to discharge   Objective   Blood pressure (!) 158/100, pulse (!) 57, temperature 97.8 F (36.6 C), resp. rate 16, height _0  (1.88 m), weight 54.4 kg (120 lb), SpO2 98 %.   Intake/Output Summary (Last 24 hours) at 09/29/2017 1444 Last data filed at 09/29/2017 1229 Gross per 24 hour  Intake 480 ml  Output -  Net 480 ml    Exam Gen:- Awake Alert, in no acute distress, chronically ill-appearing HEENT:- [place].AT, No sclera icterus Neck-Supple Neck,No JVD,.  Nose-  3.5 L/min Lungs-improved air movement especially on the right, no wheezing CV- S1, S2 normal,  Abd-  +ve B.Sounds, Abd Soft, No tenderness,    Extremity/Skin:- No  edema, good pulses Psych-affect is appropriate, some cognitive deficits  neuro-generalized weakness without new focal deficits,  no tremors    Data Review   CBC w Diff:  Lab Results  Component Value Date   WBC 5.5 09/29/2017   HGB 8.9 (L) 09/29/2017   HCT 28.7 (L) 09/29/2017   PLT 140 (L) 09/29/2017   LYMPHOPCT 20 09/20/2017   MONOPCT 7 09/20/2017   EOSPCT 0  09/20/2017   BASOPCT 0 09/20/2017    CMP:  Lab Results  Component Value Date   NA 138 09/29/2017   K 4.0 09/29/2017   CL 108 09/29/2017   CO2 24 09/29/2017   BUN 17 09/29/2017   CREATININE 0.75 09/29/2017   CREATININE 0.88 03/28/2015   PROT 6.0 (L) 09/20/2017   ALBUMIN 1.7 (L) 09/20/2017   BILITOT 0.6 09/20/2017   ALKPHOS 87 09/20/2017   AST 19 09/20/2017   ALT 10 (L) 09/20/2017  .   Total Discharge time is about 33 minutes  Roxan Hockey M.D on 09/29/2017 at 2:44 PM  [facility]   Office  [phone]  Voice Recognition Viviann Spare dictation system was used to create this note, attempts have been made to correct errors. Please contact the author with questions and/or clarifications.

## 2017-09-29 NOTE — Progress Notes (Signed)
Follow-up with son, [name] via telephone. Answered questions and concerns regarding plan of care and diagnoses. The plan is for discharge to SNF today for rehab. [name] remains hopeful for his father to return home after rehab and is interested in hospice services when he does return home. Discussed hospice philosophy. I encouraged [name] to contact his father's PCP and ask for outpatient palliative referral so a outpatient palliative provider can further meet and discuss [place] at rehab. [name] also has decided for code status to be changed to DNR after further discussion with his father. I have notified SW to request outpatient palliative referral. Again encouraged Rick to contact PCP for outpatient palliative referral so palliative provider can follow and complete MOST form with him. Engineering geologist of our conversation and has a good understanding that his father is likely eligible for hospice services when discharged back home with multiple underlying co-morbidities and deconditioning.  NO CHARGE  Ihor Dow, FNP-C Palliative Medicine Team  Phone: [phone] Fax: [phone]

## 2017-09-29 NOTE — Clinical Social Work Placement (Signed)
   3:10 PM Patient and family chose bed at [facility].  LCSW confirmed bed with facility.   LCSW faxed dc docs to facility.   Patient will transport by [facility].  RN report#: [id number]  BKJ  CLINICAL SOCIAL WORK PLACEMENT  NOTE  Date:  09/29/2017  Patient Details  Name: Derrick Sanchez MRN: [id number] Date of Birth: 15-Sep-1930  Clinical Social Work is seeking post-discharge placement for this patient at the [place] level of care (*CSW will initial, date and re-position this form in  chart as items are completed):  Yes   Patient/family provided with [place] Work Department's list of facilities offering this level of care within the geographic area requested by the patient (or if unable, by the patient's family).  Yes   Patient/family informed of their freedom to choose among providers that offer the needed level of care, that participate in Medicare, Medicaid or managed care program needed by the patient, have an available bed and are willing to accept the patient.  Yes   Patient/family informed of [place]'s ownership interest in [facility] and [facility], as well as of the fact that they are under no obligation to receive care at these facilities.  PASRR submitted to EDS on       PASRR number received on 09/25/17     Existing PASRR number confirmed on       FL2 transmitted to all facilities in geographic area requested by pt/family on 09/25/17     FL2 transmitted to all facilities within larger geographic area on       Patient informed that his/her managed care company has contracts with or will negotiate with certain facilities, including the following:        Yes   Patient/family informed of bed offers received.  Patient chooses bed at [facility]     Physician recommends and patient chooses bed at      Patient to be transferred to [facility] on 09/29/17.  Patient to be transferred to facility by EMS     Patient  family notified on 09/29/17 of transfer.  Name of family member notified:  [facility]     PHYSICIAN       Additional Comment:    _______________________________________________ Servando Snare, LCSW 09/29/2017, 3:10 PM

## 2017-09-30 ENCOUNTER — Other Ambulatory Visit: Payer: Self-pay

## 2017-09-30 ENCOUNTER — Emergency Department (HOSPITAL_COMMUNITY): Payer: PPO

## 2017-09-30 ENCOUNTER — Encounter (HOSPITAL_COMMUNITY): Payer: Self-pay

## 2017-09-30 ENCOUNTER — Inpatient Hospital Stay (HOSPITAL_COMMUNITY)
Admission: EM | Admit: 2017-09-30 | Discharge: 2017-10-07 | DRG: 177 | Disposition: A | Discharge status: Hospice | Payer: PPO | Source: Skilled Nursing Facility | Attending: Internal Medicine | Admitting: Internal Medicine

## 2017-09-30 DIAGNOSIS — R296 Repeated falls: Secondary | ICD-10-CM | POA: Diagnosis not present

## 2017-09-30 DIAGNOSIS — Z681 Body mass index (BMI) 19 or less, adult: Secondary | ICD-10-CM | POA: Diagnosis not present

## 2017-09-30 DIAGNOSIS — I10 Essential (primary) hypertension: Secondary | ICD-10-CM | POA: Diagnosis not present

## 2017-09-30 DIAGNOSIS — J90 Pleural effusion, not elsewhere classified: Secondary | ICD-10-CM | POA: Diagnosis not present

## 2017-09-30 DIAGNOSIS — R0682 Tachypnea, not elsewhere classified: Secondary | ICD-10-CM | POA: Diagnosis not present

## 2017-09-30 DIAGNOSIS — Z66 Do not resuscitate: Secondary | ICD-10-CM | POA: Diagnosis not present

## 2017-09-30 DIAGNOSIS — E43 Unspecified severe protein-calorie malnutrition: Secondary | ICD-10-CM | POA: Diagnosis present

## 2017-09-30 DIAGNOSIS — S2249XA Multiple fractures of ribs, unspecified side, initial encounter for closed fracture: Secondary | ICD-10-CM | POA: Diagnosis not present

## 2017-09-30 DIAGNOSIS — I5033 Acute on chronic diastolic (congestive) heart failure: Secondary | ICD-10-CM | POA: Diagnosis present

## 2017-09-30 DIAGNOSIS — L8992 Pressure ulcer of unspecified site, stage 2: Secondary | ICD-10-CM | POA: Diagnosis not present

## 2017-09-30 DIAGNOSIS — M4620 Osteomyelitis of vertebra, site unspecified: Secondary | ICD-10-CM | POA: Diagnosis present

## 2017-09-30 DIAGNOSIS — R1310 Dysphagia, unspecified: Secondary | ICD-10-CM | POA: Diagnosis not present

## 2017-09-30 DIAGNOSIS — J690 Pneumonitis due to inhalation of food and vomit: Principal | ICD-10-CM | POA: Diagnosis present

## 2017-09-30 DIAGNOSIS — Z96652 Presence of left artificial knee joint: Secondary | ICD-10-CM | POA: Diagnosis not present

## 2017-09-30 DIAGNOSIS — R627 Adult failure to thrive: Secondary | ICD-10-CM | POA: Diagnosis present

## 2017-09-30 DIAGNOSIS — Z9189 Other specified personal risk factors, not elsewhere classified: Secondary | ICD-10-CM

## 2017-09-30 DIAGNOSIS — Z515 Encounter for palliative care: Secondary | ICD-10-CM | POA: Diagnosis present

## 2017-09-30 DIAGNOSIS — R319 Hematuria, unspecified: Secondary | ICD-10-CM | POA: Diagnosis present

## 2017-09-30 DIAGNOSIS — G8929 Other chronic pain: Secondary | ICD-10-CM | POA: Diagnosis not present

## 2017-09-30 DIAGNOSIS — I5032 Chronic diastolic (congestive) heart failure: Secondary | ICD-10-CM | POA: Diagnosis not present

## 2017-09-30 DIAGNOSIS — R05 Cough: Secondary | ICD-10-CM | POA: Diagnosis not present

## 2017-09-30 DIAGNOSIS — R0602 Shortness of breath: Secondary | ICD-10-CM | POA: Diagnosis not present

## 2017-09-30 DIAGNOSIS — H4010X0 Unspecified open-angle glaucoma, stage unspecified: Secondary | ICD-10-CM | POA: Diagnosis not present

## 2017-09-30 DIAGNOSIS — S142XXA Injury of nerve root of cervical spine, initial encounter: Secondary | ICD-10-CM | POA: Diagnosis not present

## 2017-09-30 DIAGNOSIS — Z86718 Personal history of other venous thrombosis and embolism: Secondary | ICD-10-CM | POA: Diagnosis not present

## 2017-09-30 DIAGNOSIS — J9601 Acute respiratory failure with hypoxia: Secondary | ICD-10-CM | POA: Diagnosis present

## 2017-09-30 DIAGNOSIS — D649 Anemia, unspecified: Secondary | ICD-10-CM | POA: Diagnosis not present

## 2017-09-30 DIAGNOSIS — J181 Lobar pneumonia, unspecified organism: Secondary | ICD-10-CM | POA: Diagnosis not present

## 2017-09-30 DIAGNOSIS — I959 Hypotension, unspecified: Secondary | ICD-10-CM | POA: Diagnosis present

## 2017-09-30 DIAGNOSIS — M6281 Muscle weakness (generalized): Secondary | ICD-10-CM | POA: Diagnosis not present

## 2017-09-30 DIAGNOSIS — G4733 Obstructive sleep apnea (adult) (pediatric): Secondary | ICD-10-CM | POA: Diagnosis not present

## 2017-09-30 DIAGNOSIS — S81802A Unspecified open wound, left lower leg, initial encounter: Secondary | ICD-10-CM | POA: Diagnosis not present

## 2017-09-30 DIAGNOSIS — J449 Chronic obstructive pulmonary disease, unspecified: Secondary | ICD-10-CM | POA: Diagnosis present

## 2017-09-30 DIAGNOSIS — L8990 Pressure ulcer of unspecified site, unspecified stage: Secondary | ICD-10-CM | POA: Diagnosis present

## 2017-09-30 DIAGNOSIS — I82409 Acute embolism and thrombosis of unspecified deep veins of unspecified lower extremity: Secondary | ICD-10-CM | POA: Diagnosis not present

## 2017-09-30 DIAGNOSIS — M8668 Other chronic osteomyelitis, other site: Secondary | ICD-10-CM | POA: Diagnosis not present

## 2017-09-30 DIAGNOSIS — Z86010 Personal history of colonic polyps: Secondary | ICD-10-CM | POA: Diagnosis not present

## 2017-09-30 DIAGNOSIS — I110 Hypertensive heart disease with heart failure: Secondary | ICD-10-CM | POA: Diagnosis present

## 2017-09-30 DIAGNOSIS — Z7189 Other specified counseling: Secondary | ICD-10-CM | POA: Diagnosis not present

## 2017-09-30 DIAGNOSIS — M79609 Pain in unspecified limb: Secondary | ICD-10-CM | POA: Diagnosis not present

## 2017-09-30 DIAGNOSIS — S2241XA Multiple fractures of ribs, right side, initial encounter for closed fracture: Secondary | ICD-10-CM | POA: Diagnosis not present

## 2017-09-30 DIAGNOSIS — Z981 Arthrodesis status: Secondary | ICD-10-CM

## 2017-09-30 DIAGNOSIS — Z96641 Presence of right artificial hip joint: Secondary | ICD-10-CM | POA: Diagnosis not present

## 2017-09-30 DIAGNOSIS — M7989 Other specified soft tissue disorders: Secondary | ICD-10-CM | POA: Diagnosis not present

## 2017-09-30 DIAGNOSIS — M545 Low back pain: Secondary | ICD-10-CM | POA: Diagnosis not present

## 2017-09-30 DIAGNOSIS — Z79899 Other long term (current) drug therapy: Secondary | ICD-10-CM | POA: Diagnosis not present

## 2017-09-30 DIAGNOSIS — Y95 Nosocomial condition: Secondary | ICD-10-CM | POA: Diagnosis present

## 2017-09-30 DIAGNOSIS — L89159 Pressure ulcer of sacral region, unspecified stage: Secondary | ICD-10-CM | POA: Diagnosis not present

## 2017-09-30 DIAGNOSIS — J189 Pneumonia, unspecified organism: Secondary | ICD-10-CM | POA: Diagnosis not present

## 2017-09-30 DIAGNOSIS — R5383 Other fatigue: Secondary | ICD-10-CM | POA: Diagnosis not present

## 2017-09-30 DIAGNOSIS — Z7902 Long term (current) use of antithrombotics/antiplatelets: Secondary | ICD-10-CM

## 2017-09-30 DIAGNOSIS — L89329 Pressure ulcer of left buttock, unspecified stage: Secondary | ICD-10-CM | POA: Diagnosis not present

## 2017-09-30 DIAGNOSIS — D696 Thrombocytopenia, unspecified: Secondary | ICD-10-CM | POA: Diagnosis present

## 2017-09-30 DIAGNOSIS — M4640 Discitis, unspecified, site unspecified: Secondary | ICD-10-CM | POA: Diagnosis not present

## 2017-09-30 DIAGNOSIS — W1789XA Other fall from one level to another, initial encounter: Secondary | ICD-10-CM | POA: Diagnosis present

## 2017-09-30 DIAGNOSIS — Z993 Dependence on wheelchair: Secondary | ICD-10-CM

## 2017-09-30 LAB — CBC WITH DIFFERENTIAL/PLATELET
BASOS PCT: 0 %
Basophils Absolute: 0 10*3/uL (ref 0.0–0.1)
EOS ABS: 0 10*3/uL (ref 0.0–0.7)
Eosinophils Relative: 0 %
HCT: 32.2 % — ABNORMAL LOW (ref 39.0–52.0)
HEMOGLOBIN: 9.8 g/dL — AB (ref 13.0–17.0)
Lymphocytes Relative: 9 %
Lymphs Abs: 0.8 10*3/uL (ref 0.7–4.0)
MCH: 27.3 pg (ref 26.0–34.0)
MCHC: 30.4 g/dL (ref 30.0–36.0)
MCV: 89.7 fL (ref 78.0–100.0)
Monocytes Absolute: 0.4 10*3/uL (ref 0.1–1.0)
Monocytes Relative: 5 %
NEUTROS PCT: 86 %
Neutro Abs: 7.8 10*3/uL — ABNORMAL HIGH (ref 1.7–7.7)
Platelets: 141 10*3/uL — ABNORMAL LOW (ref 150–400)
RBC: 3.59 MIL/uL — ABNORMAL LOW (ref 4.22–5.81)
RDW: 18.2 % — ABNORMAL HIGH (ref 11.5–15.5)
WBC: 9 10*3/uL (ref 4.0–10.5)

## 2017-09-30 LAB — I-STAT TROPONIN, ED: Troponin i, poc: 0.01 ng/mL (ref 0.00–0.08)

## 2017-09-30 LAB — BASIC METABOLIC PANEL
Anion gap: 6 (ref 5–15)
BUN: 17 mg/dL (ref 6–20)
CHLORIDE: 107 mmol/L (ref 101–111)
CO2: 24 mmol/L (ref 22–32)
CREATININE: 0.75 mg/dL (ref 0.61–1.24)
Calcium: 7.6 mg/dL — ABNORMAL LOW (ref 8.9–10.3)
Glucose, Bld: 88 mg/dL (ref 65–99)
Potassium: 4.6 mmol/L (ref 3.5–5.1)
SODIUM: 137 mmol/L (ref 135–145)

## 2017-09-30 MED ORDER — LATANOPROST 0.005 % OP SOLN
1.0000 [drp] | Freq: Every day | OPHTHALMIC | Status: DC
  Administered 2017-09-30 – 2017-10-06 (×7): 1 [drp] via OPHTHALMIC
  Filled 2017-09-30: qty 2.5

## 2017-09-30 MED ORDER — PIPERACILLIN-TAZOBACTAM 3.375 G IVPB
3.3750 g | Freq: Three times a day (TID) | INTRAVENOUS | Status: DC
  Administered 2017-10-01 – 2017-10-07 (×20): 3.375 g via INTRAVENOUS
  Filled 2017-09-30 (×20): qty 50

## 2017-09-30 MED ORDER — LEVOFLOXACIN 500 MG PO TABS: 500.0000 mg | ORAL_TABLET | Freq: Every day | ORAL | Status: DC

## 2017-09-30 MED ORDER — FERROUS SULFATE 325 (65 FE) MG PO TABS
325.0000 mg | ORAL_TABLET | Freq: Every day | ORAL | Status: DC
  Administered 2017-10-01 – 2017-10-07 (×7): 325 mg via ORAL
  Filled 2017-09-30 (×7): qty 1

## 2017-09-30 MED ORDER — VANCOMYCIN HCL IN DEXTROSE 1-5 GM/200ML-% IV SOLN
1000.0000 mg | INTRAVENOUS | Status: DC
  Administered 2017-10-01: 1000 mg via INTRAVENOUS

## 2017-09-30 MED ORDER — ACETAMINOPHEN 325 MG PO TABS: 650.0000 mg | ORAL_TABLET | Freq: Four times a day (QID) | ORAL | Status: DC | PRN

## 2017-09-30 MED ORDER — VANCOMYCIN HCL IN DEXTROSE 1-5 GM/200ML-% IV SOLN
1000.0000 mg | Freq: Once | INTRAVENOUS | Status: AC
  Administered 2017-09-30: 1000 mg via INTRAVENOUS
  Filled 2017-09-30: qty 200

## 2017-09-30 MED ORDER — PRAMIPEXOLE DIHYDROCHLORIDE 1 MG PO TABS
1.0000 mg | ORAL_TABLET | Freq: Three times a day (TID) | ORAL | Status: DC
  Administered 2017-09-30 – 2017-10-07 (×21): 1 mg via ORAL
  Filled 2017-09-30 (×22): qty 1

## 2017-09-30 MED ORDER — RIVAROXABAN 20 MG PO TABS
20.0000 mg | ORAL_TABLET | Freq: Every day | ORAL | Status: DC
  Administered 2017-10-01 – 2017-10-06 (×6): 20 mg via ORAL
  Filled 2017-09-30 (×6): qty 1

## 2017-09-30 MED ORDER — IPRATROPIUM-ALBUTEROL 0.5-2.5 (3) MG/3ML IN SOLN: 3.0000 mL | RESPIRATORY_TRACT | Status: DC | PRN

## 2017-09-30 MED ORDER — IPRATROPIUM-ALBUTEROL 0.5-2.5 (3) MG/3ML IN SOLN
3.0000 mL | Freq: Four times a day (QID) | RESPIRATORY_TRACT | Status: DC
  Administered 2017-09-30: 3 mL via RESPIRATORY_TRACT
  Filled 2017-09-30: qty 3

## 2017-09-30 MED ORDER — PANTOPRAZOLE SODIUM 40 MG PO TBEC
40.0000 mg | DELAYED_RELEASE_TABLET | Freq: Every day | ORAL | Status: DC
  Administered 2017-09-30 – 2017-10-07 (×8): 40 mg via ORAL
  Filled 2017-09-30 (×8): qty 1

## 2017-09-30 MED ORDER — PIPERACILLIN-TAZOBACTAM 3.375 G IVPB 30 MIN
3.3750 g | Freq: Once | INTRAVENOUS | Status: AC
  Administered 2017-09-30: 3.375 g via INTRAVENOUS
  Filled 2017-09-30 (×2): qty 50

## 2017-09-30 MED ORDER — SODIUM CHLORIDE 3 % IN NEBU
4.0000 mL | INHALATION_SOLUTION | Freq: Three times a day (TID) | RESPIRATORY_TRACT | Status: DC
  Administered 2017-09-30 – 2017-10-01 (×2): 4 mL via RESPIRATORY_TRACT
  Filled 2017-09-30 (×3): qty 4

## 2017-09-30 MED ORDER — GUAIFENESIN ER 600 MG PO TB12
600.0000 mg | ORAL_TABLET | Freq: Two times a day (BID) | ORAL | Status: DC
  Administered 2017-10-01 – 2017-10-07 (×12): 600 mg via ORAL
  Filled 2017-09-30 (×13): qty 1

## 2017-09-30 MED ORDER — HYDROCODONE-ACETAMINOPHEN 5-325 MG PO TABS
1.0000 | ORAL_TABLET | Freq: Four times a day (QID) | ORAL | Status: DC | PRN
  Administered 2017-09-30 – 2017-10-07 (×6): 1 via ORAL
  Filled 2017-09-30 (×7): qty 1

## 2017-09-30 MED ORDER — FLORANEX PO PACK
1.0000 g | PACK | Freq: Three times a day (TID) | ORAL | Status: DC
  Administered 2017-09-30 – 2017-10-07 (×19): 1 g via ORAL
  Filled 2017-09-30 (×22): qty 1

## 2017-09-30 NOTE — H&P (Addendum)
History and Physical  Derrick Sanchez JTT:[id number] DOB: Apr 02, 1931 DOA: 09/30/2017  Referring physician: Dr. Tyrone Nine PCP: [name], MD  Outpatient Specialists: Dr Carloyn Manner, Neurosurgery Patient coming from: SNF Chief Complaint: Shortness of breath and cough  HPI: Derrick Sanchez is a 84 y.o. male with medical history significant for spinal epidural abscess, lumbar radiculopathy, paraparesis of lower limbs, chronic osteomyelitis of lumbar spine, frequent falls, malnutrition, failure to thrive who presented to ED [facility] from SNF with complaints of dyspnea and persistent productive cough.  Patient was discharged from the hospital yesterday 09/29/17 after being admitted for right upper lobe community-acquired pneumonia and vertebral osteomyelitis/discitis discharged on oral Levaquin.  He denies chest pain or palpitations.  Admitted for suspected aspiration pneumonia versus pneumonitis versus HCAP.  ED Course: Chest x-ray done in the ED personally reviewed revealed bilateral infiltrates more prominent in RML and increase in pulmonary vascularity.  Personally reviewed EKG revealed sinus rhythm rate of 69 with occasional PACs.  Hypoxic with O2 saturations between 88 and 90% on 3 L.  Review of Systems: Review of systems as noted in the HPI.  All other systems reviewed and are negative.   Past Medical History:  Diagnosis Date  . Allergy   . Arthritis   . Blood transfusion   . Candidiasis of the esophagus 07-17-2000   EGD  . Cataract    bil eyes  . Colon polyps 05-27-2002   Tubulovillous Adenoma-Colonoscopy  . Diverticulosis of colon (without mention of hemorrhage) 2008,2006,2003   Colonoscopy  . Esophageal reflux 2008   EGD  . Glaucoma   . Hiatal hernia 2002,2003,2013   EGD  . Hyperparathyroidism, unspecified ([place])   . Hypertension   . Internal hemorrhoids without mention of complication 0092   Colonoscopy  . Multiple falls    Pt falls asleep without warning and has encounter multiple falls  .  Obstructive sleep apnea (adult) (pediatric)   . Other vitamin B12 deficiency anemia   . Pneumonia    years ago  . Restless legs syndrome (RLS)   . Stricture and stenosis of esophagus 05-27-2002   EGD  . Unspecified asthma(493.90)    Past Surgical History:  Procedure Laterality Date  . arm surgery    . CATARACT EXTRACTION Bilateral   . COLONOSCOPY    . EYE SURGERY Bilateral    cataracts iol  . HIP SURGERY Right 1998   replacement  . LUMBAR FUSION  x 3   most recent June 2016  . LUMBAR LAMINECTOMY/ DECOMPRESSION WITH MET-RX N/A 11/23/2014   Procedure: HARDWARE REMOVAL WITH MET-RX;  Surgeon: Jovita Gamma, MD;  Location: [place] NEURO ORS;  Service: Neurosurgery;  Laterality: N/A;  removal of lumbar posterior instrumentation  . LUMBAR WOUND DEBRIDEMENT N/A 03/12/2014   Procedure: LUMBAR WOUND DEBRIDEMENT;  Surgeon: Elaina Hoops, MD;  Location: [place] NEURO ORS;  Service: Neurosurgery;  Laterality: N/A;  LUMBAR WOUND DEBRIDEMENT  . MASS EXCISION  10/14/2011   Procedure: EXCISION MASS;  Surgeon: Wynonia Sours, MD;  Location: [place];  Service: Orthopedics;  Laterality: Left;   left hand  . PARATHYROIDECTOMY  2008  . PICC LINE PLACE PERIPHERAL ([place] HX) Left   . TOTAL KNEE ARTHROPLASTY Left 1998   replacement    Social History:  reports that he has never smoked. He has never used smokeless tobacco. He reports that he does not drink alcohol or use drugs.   No Known Allergies  Family History  Problem Relation Age of Onset  . Heart  disease Father   . Brain cancer Unknown        3 brothers  . Heart disease Sister   . Leukemia Brother   . Colon cancer Neg Hx   . Esophageal cancer Neg Hx   . Rectal cancer Neg Hx   . Stomach cancer Neg Hx   . Diabetes Neg Hx   . Kidney disease Neg Hx   . Liver disease Neg Hx      Prior to Admission medications   Medication Sig Start Date End Date Taking? Authorizing Provider  acetaminophen (TYLENOL) 325 MG tablet Take 2 tablets (650 mg  total) by mouth every 6 (six) hours as needed for mild pain (or Fever >/= 101). 09/29/17  Yes Emokpae, Courage, MD  albuterol (PROVENTIL HFA) 108 (90 Base) MCG/ACT inhaler Inhale 2 puffs into the lungs every 6 (six) hours as needed for wheezing.   Yes [provider]  albuterol (PROVENTIL) (2.5 MG/3ML) 0.083% nebulizer solution Take 3 mLs (2.5 mg total) by nebulization 2 (two) times daily. 09/29/17  Yes Emokpae, Courage, MD  amLODipine (NORVASC) 2.5 MG tablet Take 1 tablet (2.5 mg total) by mouth daily. 09/29/17  Yes Emokpae, Courage, MD  B Complex-C (B-COMPLEX WITH VITAMIN C) tablet Take 1 tablet by mouth daily.   Yes [provider]  bimatoprost (LUMIGAN) 0.01 % SOLN Place 1 drop into both eyes at bedtime.   Yes [provider]  esomeprazole (NEXIUM) 40 MG capsule Take 40 mg by mouth daily.  03/28/16  Yes Irene Shipper, MD  ferrous sulfate 325 (65 FE) MG tablet Take 1 tablet by mouth daily.   Yes [provider]  gabapentin (NEURONTIN) 300 MG capsule Take 300-600 mg by mouth 3 (three) times daily. Take 2 capsules (600 mg) every morning, 1 capsule (300 mg) at noon, 2 capsules (600 mg) at bedtime   Yes [provider]  guaiFENesin (ROBITUSSIN) 100 MG/5ML SOLN Take 10 mLs by mouth 2 (two) times daily.   Yes [provider]  lactobacillus (FLORANEX/LACTINEX) PACK Take 1 packet (1 g total) by mouth 3 (three) times daily with meals. 09/29/17  Yes Emokpae, Courage, MD  lactose free nutrition (BOOST PLUS) LIQD Take 237 mLs by mouth 3 (three) times daily between meals. 09/29/17  Yes Emokpae, Courage, MD  levofloxacin (LEVAQUIN) 500 MG tablet Take 1 tablet (500 mg total) by mouth daily. 09/29/17  Yes Emokpae, Courage, MD  ondansetron (ZOFRAN) 4 MG tablet Take 1 tablet (4 mg total) by mouth every 6 (six) hours as needed for nausea. 09/29/17  Yes Emokpae, Courage, MD  oxyCODONE-acetaminophen (PERCOCET) 5-325 MG tablet Take 1 tablet by mouth every 6 (six) hours as  needed for moderate pain. 09/29/17  Yes Emokpae, Courage, MD  pramipexole (MIRAPEX) 1 MG tablet Take 1 mg by mouth 3 (three) times daily.    Yes [provider]  senna-docusate (SENOKOT-S) 8.6-50 MG tablet Take 2 tablets by mouth at bedtime. 09/29/17 09/29/18 Yes Emokpae, Courage, MD  XARELTO 20 MG TABS tablet Take 20 mg by mouth daily. 08/26/17  Yes [provider]  guaiFENesin (MUCINEX) 600 MG 12 hr tablet Take 1 tablet (600 mg total) by mouth 2 (two) times daily for 10 days. Patient not taking: Reported on 09/30/2017 09/29/17 10/09/17  Roxan Hockey, MD    Physical Exam: BP 110/73 (BP Location: Right Arm)   Pulse 72   Temp 97.6 F (36.4 C) (Axillary)   Resp 19   SpO2 96%   General: 84 year old Caucasian  male very frail alert and oriented x3.  Dry oral mucosa. Eyes: Anicteric sclera.  Pupils are round and reactive to light. ENT: Dry mucosa.  No erythema or exudates. Neck: No JVD or thyromegaly. Cardiovascular: Regular rate and rhythm with no rubs or rales. Respiratory: Diffuse rales with no wheezes.  Poor inspiratory effort Abdomen: Soft nontender nondistended with normal bowel sounds x4 Skin: Poorly kept mild erythema in abdomen. Musculoskeletal: Mildly contracture of lower extremity bilaterally.  Chronic stasis dermatitis in lower extremities. Psychiatric: Mood is appropriate for condition and setting. Neurologic: Contractures noted in lower extremities.  Alert and oriented x3.          Labs on Admission:  Basic Metabolic Panel: Recent Labs  Lab 09/26/17 0509 09/29/17 0541 09/30/17 1309  NA 141 138 137  K 3.7 4.0 4.6  CL 109 108 107  CO2 '22 24 24  ' GLUCOSE 84 78 88  BUN 25* 17 17  CREATININE 0.83 0.75 0.75  CALCIUM 7.7* 7.5* 7.6*   Liver Function Tests: No results for input(s): AST, ALT, ALKPHOS, BILITOT, PROT, ALBUMIN in the last 168 hours. No results for input(s): LIPASE, AMYLASE in the last 168 hours. No results for input(s): AMMONIA in the last  168 hours. CBC: Recent Labs  Lab 09/26/17 0509 09/29/17 0541 09/30/17 1309  WBC 5.1 5.5 9.0  NEUTROABS  --   --  7.8*  HGB 9.4* 8.9* 9.8*  HCT 31.2* 28.7* 32.2*  MCV 90.2 89.7 89.7  PLT 155 140* 141*   Cardiac Enzymes: No results for input(s): CKTOTAL, CKMB, CKMBINDEX, TROPONINI in the last 168 hours.  BNP (last 3 results) Recent Labs    09/22/17 0553  BNP 241.4*    ProBNP (last 3 results) No results for input(s): PROBNP in the last 8760 hours.  CBG: No results for input(s): GLUCAP in the last 168 hours.  Radiological Exams on Admission: Dg Chest 2 View  Result Date: 09/30/2017 CLINICAL DATA:  Cough, low oxygen saturation, non communicative patient EXAM: CHEST - 2 VIEW COMPARISON:  Chest x-ray of 09/29/2017 FINDINGS: The lungs are not well aerated and there has been and increase in airspace disease at both lung bases. This may be due to developing pneumonia and possible small effusions bilaterally. Also and element of congestive heart failure superimposed cannot be excluded. The heart is mildly enlarged. Old right rib fractures are present and there is a chronic dislocation of the right shoulder. IMPRESSION: 1. Poor aeration with bibasilar airspace disease. Suspect pneumonia and possible effusions but superimposed CHF cannot be excluded. 2. Stable cardiomegaly. 3. Chronic dislocation of the right shoulder. Electronically Signed   By: Ivar Drape M.D.   On: 09/30/2017 13:41   Dg Chest 2 View  Result Date: 09/29/2017 CLINICAL DATA:  Shortness of breath. EXAM: CHEST - 2 VIEW COMPARISON:  09/28/2017. FINDINGS: Mediastinum hilar structures normal. Heart size normal. Persistent left base atelectasis/infiltrate and left pleural effusion with slight improvement from prior exam. New onset of right base atelectasis/infiltrate. Probable right pleural effusion. No pneumothorax. Heart size stable. Deformity of right ribs again noted most likely from old injury. Surgical clips upper chest.  IMPRESSION: 1. Persistent left base atelectasis/infiltrate left-sided pleural effusion with slight improvement from prior exam. 2. New onset right base atelectasis/infiltrate. Probable right-sided pleural effusion. Electronically Signed   By: Marcello Moores  Register   On: 09/29/2017 09:52    EKG: Independently reviewed.  Sinus rhythm with rate of 69 and occasional PACs  Assessment/Plan Present on Admission: . HCAP (healthcare-associated pneumonia)  Active Problems:   HCAP (healthcare-associated pneumonia)  Acute hypoxic respiratory failure secondary to suspected aspiration pneumonia versus pneumonitis versus HCAP Chest x-ray personally reviewed revealed bilateral infiltrates more prominent on the right middle lobe with suspicion for aspiration Continue IV vancomycin and IV Zosyn started in the ED N.p.o. until cleared by speech therapy after swallow evaluation Sputum culture Duo nebs every 6 hours and every 2 hours as needed Hypersaline nebs Continue O2 supplementation to maintain O2 saturation 92% or greater Currently requiring 3 L O2 on nasal cannula with sat 92%.  Chronic diastolic heart failure with LVEF 06-23% Grade 1 diastolic dysfunction on 2D echo done on 09/22/2017 Strict I's and O's Daily weight Avoid hypervolemia  Adult failure to thrive/severe physical debility Review goals of care with suspicion for aspiration Palliative care consult  Chronic low back pain/history of chronic vertebral osteomyelitis Pain management in place Bowel regimen in place Monitor vital signs On chronic suppressive therapy with Levaquin  History of left lower extremity DVT On Xarelto      DVT prophylaxis: SCDs, Xarelto  Code Status: DNR Family Communication: Called his son [name] and left a message phone [phone]  Disposition Plan: Admit to telemetry  Consults called: None  Admission status: Inpatient    Kayleen Memos MD [facility] Pager [phone]  If 7PM-7AM,  please contact night-coverage [email or web address] Password [email or web address]  09/30/2017, 2:40 PM

## 2017-09-30 NOTE — ED Triage Notes (Signed)
Per EMS, pt is coming the [facility] facility after staff called and stated that pt oxygen saturation had dropped after coughing up a moderate amount of sputum. Per staff oxygen saturation returned to baseline shortly after. Per EMS pt currently being treated for pneumonia. Pt wears 3L of O2 at home for COPD. Pt denies shortness of breath at this time. Pt is AO x4. Pt has a hx of GERD, HTN, Multiple Sclerosis, and COPD.

## 2017-09-30 NOTE — Progress Notes (Signed)
Pharmacy Antibiotic Note  Derrick Sanchez is a 84 y.o. male admitted on 09/30/2017 with HCAP and suspected aspiration.  Pharmacy has been consulted for Vancomycin and Zosyn dosing.  Plan:  Vancomycin 1000 mg IV q36 hr (est AUC 491 based on SCr rounded to 1.0)  Measure vancomycin AUC at steady state as indicated  Zosyn 3.375 g IV given once over 30 minutes, then every 8 hrs by 4-hr infusion  Height: 6\' 2"  (188 cm) IBW/kg (Calculated) : 82.2  Temp (24hrs), Avg:97.6 F (36.4 C), Min:97.6 F (36.4 C), Max:97.6 F (36.4 C)  Recent Labs  Lab 09/26/17 0509 09/29/17 0541 09/30/17 1309  WBC 5.1 5.5 9.0  CREATININE 0.83 0.75 0.75    Estimated Creatinine Clearance: 51.9 mL/min (by C-G formula based on SCr of 0.75 mg/dL).    No Known Allergies    Thank you for allowing pharmacy to be a part of this patient's care.  Reuel Boom, PharmD, BCPS [phone] 09/30/2017, 10:25 PM

## 2017-09-30 NOTE — ED Provider Notes (Signed)
[facility] DEPT Provider Note   CSN: [id number] Arrival date & time: 09/30/17  1205     History   Chief Complaint Chief Complaint  Patient presents with  . Low oxygen saturation    HPI Derrick Sanchez is a 84 y.o. male.  84 yo M with a chief complaint of shortness of breath.  The patient was recently in the hospital and was discharged to a nursing facility where he was noted to have a low oxygen saturation.  He was placed on oxygen and sent here.  He is currently on Levaquin for community acquired pneumonia.  The patient feels much better on oxygen.  Denies chest pain lower extremity edema.  Denies fevers or chills.  The history is provided by the patient.  Illness  This is a new problem. The current episode started more than 1 week ago. The problem occurs constantly. The problem has been gradually worsening. Associated symptoms include shortness of breath. Pertinent negatives include no chest pain, no abdominal pain and no headaches. Nothing aggravates the symptoms. Nothing relieves the symptoms. He has tried nothing for the symptoms.    Past Medical History:  Diagnosis Date  . Allergy   . Arthritis   . Blood transfusion   . Candidiasis of the esophagus 07-17-2000   EGD  . Cataract    bil eyes  . Colon polyps 05-27-2002   Tubulovillous Adenoma-Colonoscopy  . Diverticulosis of colon (without mention of hemorrhage) 2008,2006,2003   Colonoscopy  . Esophageal reflux 2008   EGD  . Glaucoma   . Hiatal hernia 2002,2003,2013   EGD  . Hyperparathyroidism, unspecified ([place])   . Hypertension   . Internal hemorrhoids without mention of complication 4098   Colonoscopy  . Multiple falls    Pt falls asleep without warning and has encounter multiple falls  . Obstructive sleep apnea (adult) (pediatric)   . Other vitamin B12 deficiency anemia   . Pneumonia    years ago  . Restless legs syndrome (RLS)   . Stricture and stenosis of esophagus 05-27-2002    EGD  . Unspecified asthma(493.90)     Patient Active Problem List   Diagnosis Date Noted  . HCAP (healthcare-associated pneumonia) 09/30/2017  . Hypoxia   . At high risk for aspiration   . Pressure injury of skin 09/22/2017  . Multiple closed fractures of ribs of right side   . Pleural effusion   . Palliative care by specialist   . CAP (community acquired pneumonia) 09/20/2017  . Acute on chronic respiratory failure with hypoxia ([place]) 09/20/2017  . Malnutrition ([place]) 09/20/2017  . FTT (failure to thrive) in adult 09/20/2017  . PVD (peripheral vascular disease) ([place]) 08/04/2016  . Goals of care, counseling/discussion 08/04/2016  . Urinary hesitancy 11/26/2015  . Essential hypertension 01/05/2015  . Lumbar radiculopathy 11/23/2014  . Spinal epidural abscess 03/12/2014  . Protein-calorie malnutrition, severe ([place]) 03/01/2014  . Frequent falls 02/27/2014  . Trochanteric fracture ([place]) 02/27/2014  . Lumbar stenosis with neurogenic claudication 02/06/2014  . Stiffness of joints, not elsewhere classified, multiple sites 12/22/2012  . Postural imbalance 12/22/2012  . Hx of falling 12/22/2012  . HYPERPARATHYROIDISM UNSPECIFIED 05/03/2010  . OBSTRUCTIVE SLEEP APNEA 05/03/2010  . GLAUCOMA 05/03/2010  . ASTHMA 05/03/2010  . ANEMIA, VITAMIN B12 DEFICIENCY 06/14/2007  . RESTLESS LEG SYNDROME 06/14/2007  . GASTROESOPHAGEAL REFLUX DISEASE 06/14/2007  . DIVERTICULOSIS, COLON 05/31/2007  . POLYP, COLON 05/27/2002    Past Surgical History:  Procedure Laterality Date  . arm  surgery    . CATARACT EXTRACTION Bilateral   . COLONOSCOPY    . EYE SURGERY Bilateral    cataracts iol  . HIP SURGERY Right 1998   replacement  . LUMBAR FUSION  x 3   most recent June 2016  . LUMBAR LAMINECTOMY/ DECOMPRESSION WITH MET-RX N/A 11/23/2014   Procedure: HARDWARE REMOVAL WITH MET-RX;  Surgeon: Jovita Gamma, MD;  Location: [facility];  Service: Neurosurgery;  Laterality: N/A;  removal of lumbar  posterior instrumentation  . LUMBAR WOUND DEBRIDEMENT N/A 03/12/2014   Procedure: LUMBAR WOUND DEBRIDEMENT;  Surgeon: Elaina Hoops, MD;  Location: [facility];  Service: Neurosurgery;  Laterality: N/A;  LUMBAR WOUND DEBRIDEMENT  . MASS EXCISION  10/14/2011   Procedure: EXCISION MASS;  Surgeon: Wynonia Sours, MD;  Location: [facility];  Service: Orthopedics;  Laterality: Left;   left hand  . PARATHYROIDECTOMY  2008  . PICC LINE PLACE PERIPHERAL ([place] HX) Left   . TOTAL KNEE ARTHROPLASTY Left 1998   replacement        Home Medications    Prior to Admission medications   Medication Sig Start Date End Date Taking? Authorizing Provider  acetaminophen (TYLENOL) 325 MG tablet Take 2 tablets (650 mg total) by mouth every 6 (six) hours as needed for mild pain (or Fever >/= 101). 09/29/17  Yes Emokpae, Courage, MD  albuterol (PROVENTIL HFA) 108 (90 Base) MCG/ACT inhaler Inhale 2 puffs into the lungs every 6 (six) hours as needed for wheezing.   Yes [provider]  albuterol (PROVENTIL) (2.5 MG/3ML) 0.083% nebulizer solution Take 3 mLs (2.5 mg total) by nebulization 2 (two) times daily. 09/29/17  Yes Emokpae, Courage, MD  amLODipine (NORVASC) 2.5 MG tablet Take 1 tablet (2.5 mg total) by mouth daily. 09/29/17  Yes Emokpae, Courage, MD  B Complex-C (B-COMPLEX WITH VITAMIN C) tablet Take 1 tablet by mouth daily.   Yes [provider]  bimatoprost (LUMIGAN) 0.01 % SOLN Place 1 drop into both eyes at bedtime.   Yes [provider]  esomeprazole (NEXIUM) 40 MG capsule Take 40 mg by mouth daily.  03/28/16  Yes Irene Shipper, MD  ferrous sulfate 325 (65 FE) MG tablet Take 1 tablet by mouth daily.   Yes [provider]  gabapentin (NEURONTIN) 300 MG capsule Take 300-600 mg by mouth 3 (three) times daily. Take 2 capsules (600 mg) every morning, 1 capsule (300 mg) at noon, 2 capsules (600 mg) at bedtime   Yes [provider]  guaiFENesin (ROBITUSSIN) 100  MG/5ML SOLN Take 10 mLs by mouth 2 (two) times daily.   Yes [provider]  lactobacillus (FLORANEX/LACTINEX) PACK Take 1 packet (1 g total) by mouth 3 (three) times daily with meals. 09/29/17  Yes Emokpae, Courage, MD  lactose free nutrition (BOOST PLUS) LIQD Take 237 mLs by mouth 3 (three) times daily between meals. 09/29/17  Yes Emokpae, Courage, MD  levofloxacin (LEVAQUIN) 500 MG tablet Take 1 tablet (500 mg total) by mouth daily. 09/29/17  Yes Emokpae, Courage, MD  ondansetron (ZOFRAN) 4 MG tablet Take 1 tablet (4 mg total) by mouth every 6 (six) hours as needed for nausea. 09/29/17  Yes Emokpae, Courage, MD  oxyCODONE-acetaminophen (PERCOCET) 5-325 MG tablet Take 1 tablet by mouth every 6 (six) hours as needed for moderate pain. 09/29/17  Yes Emokpae, Courage, MD  pramipexole (MIRAPEX) 1 MG tablet Take 1 mg by mouth 3 (three) times daily.    Yes [provider]  senna-docusate (SENOKOT-S) 8.6-50 MG tablet Take 2 tablets by mouth at bedtime. 09/29/17 09/29/18 Yes Emokpae, Courage, MD  XARELTO 20 MG TABS tablet Take 20 mg by mouth daily. 08/26/17  Yes [provider]  guaiFENesin (MUCINEX) 600 MG 12 hr tablet Take 1 tablet (600 mg total) by mouth 2 (two) times daily for 10 days. Patient not taking: Reported on 09/30/2017 09/29/17 10/09/17  Roxan Hockey, MD    Family History Family History  Problem Relation Age of Onset  . Heart disease Father   . Brain cancer Unknown        3 brothers  . Heart disease Sister   . Leukemia Brother   . Colon cancer Neg Hx   . Esophageal cancer Neg Hx   . Rectal cancer Neg Hx   . Stomach cancer Neg Hx   . Diabetes Neg Hx   . Kidney disease Neg Hx   . Liver disease Neg Hx     Social History Social History   Tobacco Use  . Smoking status: Never Smoker  . Smokeless tobacco: Never Used  Substance Use Topics  . Alcohol use: No    Alcohol/week: 0.0 oz  . Drug use: No     Allergies   Patient has no known  allergies.   Review of Systems Review of Systems  Constitutional: Positive for fatigue. Negative for chills and fever.  HENT: Positive for congestion. Negative for facial swelling.   Eyes: Negative for discharge and visual disturbance.  Respiratory: Positive for cough and shortness of breath.   Cardiovascular: Negative for chest pain and palpitations.  Gastrointestinal: Negative for abdominal pain, diarrhea and vomiting.  Musculoskeletal: Negative for arthralgias and myalgias.  Skin: Negative for color change and rash.  Neurological: Negative for tremors, syncope and headaches.  Psychiatric/Behavioral: Negative for confusion and dysphoric mood.     Physical Exam Updated Vital Signs BP 110/73 (BP Location: Right Arm)   Pulse 72   Temp 97.6 F (36.4 C) (Axillary)   Resp 19   SpO2 96%   Physical Exam  Constitutional: He is oriented to person, place, and time. He appears well-developed and well-nourished.  HENT:  Head: Normocephalic and atraumatic.  Eyes: Pupils are equal, round, and reactive to light. EOM are normal.  Neck: Normal range of motion. Neck supple. No JVD present.  Cardiovascular: Normal rate and regular rhythm. Exam reveals no gallop and no friction rub.  No murmur heard. Pulmonary/Chest: No respiratory distress. He has no wheezes.  Rhonchi noted to the left lung diffusely  Abdominal: He exhibits no distension and no mass. There is no tenderness. There is no rebound and no guarding.  Musculoskeletal: Normal range of motion.  Chronic appearing changes to bilateral lower extremities.  Chronic bruising.  Contractured.  Neurological: He is alert and oriented to person, place, and time.  Skin: No rash noted. No pallor.  Psychiatric: He has a normal mood and affect. His behavior is normal.  Nursing note and vitals reviewed.    ED Treatments / Results  Labs (all labs ordered are listed, but only abnormal results are displayed) Labs Reviewed  CBC WITH  DIFFERENTIAL/PLATELET - Abnormal; Notable for the following components:      Result Value   RBC 3.59 (*)    Hemoglobin 9.8 (*)    HCT 32.2 (*)    RDW 18.2 (*)    Platelets 141 (*)    Neutro Abs 7.8 (*)    All other components within normal limits  BASIC METABOLIC PANEL -  Abnormal; Notable for the following components:   Calcium 7.6 (*)    All other components within normal limits  CULTURE, EXPECTORATED SPUTUM-ASSESSMENT  I-STAT TROPONIN, ED    EKG None  Radiology Dg Chest 2 View  Result Date: 09/30/2017 CLINICAL DATA:  Cough, low oxygen saturation, non communicative patient EXAM: CHEST - 2 VIEW COMPARISON:  Chest x-ray of 09/29/2017 FINDINGS: The lungs are not well aerated and there has been and increase in airspace disease at both lung bases. This may be due to developing pneumonia and possible small effusions bilaterally. Also and element of congestive heart failure superimposed cannot be excluded. The heart is mildly enlarged. Old right rib fractures are present and there is a chronic dislocation of the right shoulder. IMPRESSION: 1. Poor aeration with bibasilar airspace disease. Suspect pneumonia and possible effusions but superimposed CHF cannot be excluded. 2. Stable cardiomegaly. 3. Chronic dislocation of the right shoulder. Electronically Signed   By: Ivar Drape M.D.   On: 09/30/2017 13:41   Dg Chest 2 View  Result Date: 09/29/2017 CLINICAL DATA:  Shortness of breath. EXAM: CHEST - 2 VIEW COMPARISON:  09/28/2017. FINDINGS: Mediastinum hilar structures normal. Heart size normal. Persistent left base atelectasis/infiltrate and left pleural effusion with slight improvement from prior exam. New onset of right base atelectasis/infiltrate. Probable right pleural effusion. No pneumothorax. Heart size stable. Deformity of right ribs again noted most likely from old injury. Surgical clips upper chest. IMPRESSION: 1. Persistent left base atelectasis/infiltrate left-sided pleural effusion  with slight improvement from prior exam. 2. New onset right base atelectasis/infiltrate. Probable right-sided pleural effusion. Electronically Signed   By: Marcello Moores  Register   On: 09/29/2017 09:52    Procedures Procedures (including critical care time)  Medications Ordered in ED Medications  vancomycin (VANCOCIN) IVPB 1000 mg/200 mL premix (has no administration in time range)  piperacillin-tazobactam (ZOSYN) IVPB 3.375 g (has no administration in time range)  guaiFENesin (MUCINEX) 12 hr tablet 600 mg (has no administration in time range)  latanoprost (XALATAN) 0.005 % ophthalmic solution 1 drop (has no administration in time range)  ferrous sulfate tablet 325 mg (has no administration in time range)  pantoprazole (PROTONIX) EC tablet 40 mg (has no administration in time range)  lactobacillus (FLORANEX/LACTINEX) granules 1 g (has no administration in time range)  levofloxacin (LEVAQUIN) tablet 500 mg (has no administration in time range)  rivaroxaban (XARELTO) tablet 20 mg (has no administration in time range)  sodium chloride HYPERTONIC 3 % nebulizer solution 4 mL (has no administration in time range)  ipratropium-albuterol (DUONEB) 0.5-2.5 (3) MG/3ML nebulizer solution 3 mL (has no administration in time range)  ipratropium-albuterol (DUONEB) 0.5-2.5 (3) MG/3ML nebulizer solution 3 mL (has no administration in time range)     Initial Impression / Assessment and Plan / ED Course  I have reviewed the triage vital signs and the nursing notes.  Pertinent labs & imaging results that were available during my care of the patient were reviewed by me and considered in my medical decision making (see chart for details).     70 yoM with a chief complaint of shortness of breath.  The patient was discharged in the hospital yesterday for pneumonia.  He was sent home on Levaquin.  He feels that he has been having trouble swallowing and has been choking on his food as he tries to eat and drink.  Chest  x-ray here today is worsened from yesterday.  He is now requiring oxygen which she does not normally at baseline.  We will broaden his coverage to vancomycin and Zosyn.  Will discuss with hospitalist for admission.  The patients results and plan were reviewed and discussed.   Any x-rays performed were independently reviewed by myself.   Differential diagnosis were considered with the presenting HPI.  Medications  vancomycin (VANCOCIN) IVPB 1000 mg/200 mL premix (has no administration in time range)  piperacillin-tazobactam (ZOSYN) IVPB 3.375 g (has no administration in time range)  guaiFENesin (MUCINEX) 12 hr tablet 600 mg (has no administration in time range)  latanoprost (XALATAN) 0.005 % ophthalmic solution 1 drop (has no administration in time range)  ferrous sulfate tablet 325 mg (has no administration in time range)  pantoprazole (PROTONIX) EC tablet 40 mg (has no administration in time range)  lactobacillus (FLORANEX/LACTINEX) granules 1 g (has no administration in time range)  levofloxacin (LEVAQUIN) tablet 500 mg (has no administration in time range)  rivaroxaban (XARELTO) tablet 20 mg (has no administration in time range)  sodium chloride HYPERTONIC 3 % nebulizer solution 4 mL (has no administration in time range)  ipratropium-albuterol (DUONEB) 0.5-2.5 (3) MG/3ML nebulizer solution 3 mL (has no administration in time range)  ipratropium-albuterol (DUONEB) 0.5-2.5 (3) MG/3ML nebulizer solution 3 mL (has no administration in time range)    Vitals:   09/30/17 1215 09/30/17 1227  BP:  110/73  Pulse:  72  Resp:  19  Temp:  97.6 F (36.4 C)  TempSrc:  Axillary  SpO2: (!) 88% 96%    Final diagnoses:  HCAP (healthcare-associated pneumonia)    Admission/ observation were discussed with the admitting physician, patient and/or family and they are comfortable with the plan.    Final Clinical Impressions(s) / ED Diagnoses   Final diagnoses:  HCAP (healthcare-associated  pneumonia)    ED Discharge Orders    None       Deno Etienne, DO 09/30/17 1525

## 2017-09-30 NOTE — ED Notes (Signed)
Bed: WA20 Expected date:  Expected time:  Means of arrival:  Comments: EMS/shob

## 2017-10-01 ENCOUNTER — Inpatient Hospital Stay (HOSPITAL_COMMUNITY): Payer: PPO

## 2017-10-01 DIAGNOSIS — M7989 Other specified soft tissue disorders: Secondary | ICD-10-CM

## 2017-10-01 DIAGNOSIS — R1310 Dysphagia, unspecified: Secondary | ICD-10-CM

## 2017-10-01 DIAGNOSIS — M79609 Pain in unspecified limb: Secondary | ICD-10-CM

## 2017-10-01 DIAGNOSIS — E43 Unspecified severe protein-calorie malnutrition: Secondary | ICD-10-CM

## 2017-10-01 DIAGNOSIS — Z515 Encounter for palliative care: Secondary | ICD-10-CM

## 2017-10-01 DIAGNOSIS — Z7189 Other specified counseling: Secondary | ICD-10-CM

## 2017-10-01 DIAGNOSIS — I5033 Acute on chronic diastolic (congestive) heart failure: Secondary | ICD-10-CM

## 2017-10-01 LAB — COMPREHENSIVE METABOLIC PANEL WITH GFR
ALT: 8 U/L — ABNORMAL LOW (ref 17–63)
AST: 19 U/L (ref 15–41)
Albumin: 1.4 g/dL — ABNORMAL LOW (ref 3.5–5.0)
Alkaline Phosphatase: 76 U/L (ref 38–126)
Anion gap: 8 (ref 5–15)
BUN: 19 mg/dL (ref 6–20)
CO2: 23 mmol/L (ref 22–32)
Calcium: 7.4 mg/dL — ABNORMAL LOW (ref 8.9–10.3)
Chloride: 106 mmol/L (ref 101–111)
Creatinine, Ser: 0.8 mg/dL (ref 0.61–1.24)
GFR calc Af Amer: >60 mL/min
GFR calc non Af Amer: >60 mL/min
Glucose, Bld: 81 mg/dL (ref 65–99)
Potassium: 4.2 mmol/L (ref 3.5–5.1)
Sodium: 137 mmol/L (ref 135–145)
Total Bilirubin: 0.6 mg/dL (ref 0.3–1.2)
Total Protein: 5.2 g/dL — ABNORMAL LOW (ref 6.5–8.1)

## 2017-10-01 LAB — CBC
HCT: 26.4 % — ABNORMAL LOW (ref 39.0–52.0)
HEMOGLOBIN: 8.2 g/dL — AB (ref 13.0–17.0)
MCH: 27.8 pg (ref 26.0–34.0)
MCHC: 31.1 g/dL (ref 30.0–36.0)
MCV: 89.5 fL (ref 78.0–100.0)
PLATELETS: 118 10*3/uL — AB (ref 150–400)
RBC: 2.95 MIL/uL — ABNORMAL LOW (ref 4.22–5.81)
RDW: 18.5 % — AB (ref 11.5–15.5)
WBC: 7.1 10*3/uL (ref 4.0–10.5)

## 2017-10-01 LAB — MRSA PCR SCREENING: MRSA by PCR: NEGATIVE

## 2017-10-01 LAB — BRAIN NATRIURETIC PEPTIDE: B Natriuretic Peptide: 82.4 pg/mL (ref 0.0–100.0)

## 2017-10-01 MED ORDER — IPRATROPIUM-ALBUTEROL 0.5-2.5 (3) MG/3ML IN SOLN
3.0000 mL | Freq: Two times a day (BID) | RESPIRATORY_TRACT | Status: DC
  Administered 2017-10-01 – 2017-10-07 (×12): 3 mL via RESPIRATORY_TRACT
  Filled 2017-10-01 (×12): qty 3

## 2017-10-01 MED ORDER — IPRATROPIUM-ALBUTEROL 0.5-2.5 (3) MG/3ML IN SOLN
3.0000 mL | Freq: Three times a day (TID) | RESPIRATORY_TRACT | Status: DC
  Administered 2017-10-01: 3 mL via RESPIRATORY_TRACT
  Filled 2017-10-01: qty 3

## 2017-10-01 MED ORDER — SODIUM CHLORIDE 3 % IN NEBU
4.0000 mL | INHALATION_SOLUTION | Freq: Two times a day (BID) | RESPIRATORY_TRACT | Status: DC
  Administered 2017-10-01 – 2017-10-07 (×11): 4 mL via RESPIRATORY_TRACT
  Filled 2017-10-01 (×14): qty 4

## 2017-10-01 MED ORDER — ENSURE ENLIVE PO LIQD
237.0000 mL | Freq: Two times a day (BID) | ORAL | Status: DC
  Administered 2017-10-01 – 2017-10-07 (×11): 237 mL via ORAL

## 2017-10-01 MED ORDER — ADULT MULTIVITAMIN W/MINERALS CH
1.0000 | ORAL_TABLET | Freq: Every day | ORAL | Status: DC
  Administered 2017-10-02 – 2017-10-07 (×6): 1 via ORAL
  Filled 2017-10-01 (×6): qty 1

## 2017-10-01 NOTE — Evaluation (Signed)
Clinical/Bedside Swallow Evaluation Patient Details  Name: Derrick Sanchez MRN: [id number] Date of Birth: 12-13-1930  Today's Date: 10/01/2017 Time: SLP Start Time (ACUTE ONLY): 1250 SLP Stop Time (ACUTE ONLY): 1315 SLP Time Calculation (min) (ACUTE ONLY): 25 min  Past Medical History:  Past Medical History:  Diagnosis Date  . Allergy   . Arthritis   . Blood transfusion   . Candidiasis of the esophagus 07-17-2000   EGD  . Cataract    bil eyes  . Colon polyps 05-27-2002   Tubulovillous Adenoma-Colonoscopy  . Diverticulosis of colon (without mention of hemorrhage) 2008,2006,2003   Colonoscopy  . Esophageal reflux 2008   EGD  . Glaucoma   . Hiatal hernia 2002,2003,2013   EGD  . Hyperparathyroidism, unspecified ([place])   . Hypertension   . Internal hemorrhoids without mention of complication 0932   Colonoscopy  . Multiple falls    Pt falls asleep without warning and has encounter multiple falls  . Obstructive sleep apnea (adult) (pediatric)   . Other vitamin B12 deficiency anemia   . Pneumonia    years ago  . Restless legs syndrome (RLS)   . Stricture and stenosis of esophagus 05-27-2002   EGD  . Unspecified asthma(493.90)    Past Surgical History:  Past Surgical History:  Procedure Laterality Date  . arm surgery    . CATARACT EXTRACTION Bilateral   . COLONOSCOPY    . EYE SURGERY Bilateral    cataracts iol  . HIP SURGERY Right 1998   replacement  . LUMBAR FUSION  x 3   most recent June 2016  . LUMBAR LAMINECTOMY/ DECOMPRESSION WITH MET-RX N/A 11/23/2014   Procedure: HARDWARE REMOVAL WITH MET-RX;  Surgeon: Jovita Gamma, MD;  Location: [facility];  Service: Neurosurgery;  Laterality: N/A;  removal of lumbar posterior instrumentation  . LUMBAR WOUND DEBRIDEMENT N/A 03/12/2014   Procedure: LUMBAR WOUND DEBRIDEMENT;  Surgeon: Elaina Hoops, MD;  Location: [facility];  Service: Neurosurgery;  Laterality: N/A;  LUMBAR WOUND DEBRIDEMENT  . MASS EXCISION  10/14/2011    Procedure: EXCISION MASS;  Surgeon: Wynonia Sours, MD;  Location: [facility];  Service: Orthopedics;  Laterality: Left;   left hand  . PARATHYROIDECTOMY  2008  . PICC LINE PLACE PERIPHERAL ([place] HX) Left   . TOTAL KNEE ARTHROPLASTY Left 1998   replacement   HPI:  84 y.o. male admitted with oxygen de-saturation and shortness of breath -.  Pt with signficant past medical history of back surgery s/p complications with discitis and osteomyelitis requiring long-term antibiotics, dysphagia, falls, hiatal hernia, HTN, DVT, esophageal stricture, and asthma admitted on 09/20/2017 with right chest pain, cough and congestion and discharged 1-2 days prior to this readmission.   Pt had recent fall when transferring from motorized scooter to bed with possible late right-sided chest pain despite oxycodone.   Pt with recent rib fractures, right upper lobe pneumonia - most recent CXR with this admit concerning for pna - and CHF.  Pt returned from SNF and is a FULL code in the hospital but DNR outside of the hospital per RN.   Swallow evaluation reordered due to pt having more problems swallowing - he has h/o chronic aspiration risk.     Assessment / Plan / Recommendation Clinical Impression  Pt known to ST services, having completed MBS in November 2018 and seen during most recent admission for dysphagia evaluation/management. Results of this study indicated moderate to severe oral and pharyngeal dysphagia and primary esophageal dysphagia.  This places pt at very high risk for ongoing aspiration, malnutrition, and dehydration.   Pt exhibits a congested nonproductive cough, even in the absence of po trials, however this was more pronounced during intake - concerning for aspiration.  Will continue diet of pureed and thin liquids however he continues to remain very high aspiration risk with intake.  Pt again clearly desires po intake and mitigation strategies in place.    Significant improvement in swallow  function and safety is again not anticipated and given recent fall s/p rib fxs - pt's swallow ability will likely continue to worsen with decreased tolerance.    Do not recommend repeat MBS as per RN, family wants pt to eat and mitigation strategies in place.  Do not anticipate aspiration will be able to be prevented.  Informed pt to findings/concerns to which he reporting understanding.  GOC again pending - Concern pt will recurrently aspirate with decreased tolerance and recurrent admits - Therefore thankful pallaitive following.   SLP available for education. SLP Visit Diagnosis: Dysphagia, oropharyngeal phase (R13.12)    Aspiration Risk  Severe aspiration risk    Diet Recommendation Dysphagia 1 (Puree);Thin liquid   Medication Administration: Whole meds with puree Supervision: Staff to assist with self feeding;Full supervision/cueing for compensatory strategies Compensations: Minimize environmental distractions;Slow rate;Small sips/bites;Hard cough after swallow;Clear throat after each swallow;Multiple dry swallows after each bite/sip    Other  Recommendations Oral Care Recommendations: Oral care QID   Follow up Recommendations 24 hour supervision/assistance      Frequency and Duration min 1 x/week  1 week       Prognosis Prognosis for Safe Diet Advancement: Guarded Barriers to Reach Goals: Severity of deficits      Swallow Study   General Date of Onset: 10/01/17 HPI: 84 y.o. male admitted with oxygen de-saturation and shortness of breath -.  Pt with signficant past medical history of back surgery s/p complications with discitis and osteomyelitis requiring long-term antibiotics, dysphagia, falls, hiatal hernia, HTN, DVT, esophageal stricture, and asthma admitted on 09/20/2017 with right chest pain, cough and congestion and discharged 1-2 days prior to this readmission.   Pt had recent fall when transferring from motorized scooter to bed with possible late right-sided chest pain  despite oxycodone.   Pt with recent rib fractures, right upper lobe pneumonia - most recent CXR with this admit concerning for pna - and CHF.  Pt returned from SNF and is a FULL code in the hospital but DNR outside of the hospital per RN.   Swallow evaluation reordered due to pt having more problems swallowing - he has h/o chronic aspiration risk.   Type of Study: Bedside Swallow Evaluation Diet Prior to this Study: Dysphagia 1 (puree);Thin liquids Temperature Spikes Noted: No Respiratory Status: Nasal cannula(5 liters) Behavior/Cognition: Alert;Cooperative;Pleasant mood Oral Cavity Assessment: Within Functional Limits Oral Care Completed by SLP: No Vision: Impaired for self-feeding Self-Feeding Abilities: Total assist Patient Positioning: Partially reclined;Postural control interferes with function Baseline Vocal Quality: Low vocal intensity;Hoarse;Wet Volitional Cough: Congested(intermittently productive) Volitional Swallow: Able to elicit    Oral/Motor/Sensory Function Overall Oral Motor/Sensory Function: (generalized weakness)   Ice Chips Ice chips: Not tested   Thin Liquid Thin Liquid: Impaired Presentation: Straw Pharyngeal  Phase Impairments: Cough - Delayed;Multiple swallows    Nectar Thick Nectar Thick Liquid: Not tested   Honey Thick Honey Thick Liquid: Not tested   Puree Puree: Impaired Presentation: Spoon Pharyngeal Phase Impairments: Suspected delayed Swallow;Multiple swallows;Cough - Delayed   Solid   GO  Solid: Not tested        Macario Golds 10/01/2017,1:26 PM  Luanna Salk, [facility] SLP [phone]

## 2017-10-01 NOTE — Progress Notes (Signed)
Pt ring that was removed last night d/t swelling was returned to son and taken home.

## 2017-10-01 NOTE — Progress Notes (Signed)
Due to swelling in upper extremities pts ring on left hand was removed and locked in security. A key to pts  lockbox is located in his chart.  Please return ring to pt before discharge.

## 2017-10-01 NOTE — Consult Note (Signed)
Consultation Note Date: 10/01/2017   Patient Name: Derrick Sanchez  DOB: 08/16/1930  MRN: [id number]  Age / Sex: 84 y.o., male  PCP: [name], MD Referring Physician: Oswald Hillock, MD  Reason for Consultation: Establishing goals of care  HPI/Patient Profile: 84 y.o. male  with past medical history of back surgery s/p complications with discitis and osteomyelitis requiring long-term antibiotics, dysphagia, falls, hiatal hernia, HTN, DVT, esophageal stricture, and asthma admitted on 09/20/2017 with right chest pain, cough and congestion. About two weeks ago, the patient suffered from a fall when transferring from motorized scooter to bed. Visited with neurosurgeon who imaged his back. Per son, no mention of rib fractures at that time. Continued to have right-sided chest pain despite oxycodone. In ED, chest xray revealed multiple right rib fractures, right upper lobe pneumonia, and large left pleural effusion with midlung airspace opacities which could represent pneumonia. CT chest revealed moderate bilateral pleural effusions.  Patient re-admitted on 09/30/17 from SNF with shortness of breath and cough. Chest xray in ED revealed bilateral infiltrates more prominent in RML and increased pulmonary vascularity. Hypoxic with oxygen saturations between 88-90% on 3L. Started on IV zosyn and vancomycin. Also with chronic diastolic heart failure with EF 60-65%. ECHO on 09/22/16 revealed Grade 1 diastolic dysfunction. Palliative medicine consultation for goals of care.   Clinical Assessment and Goals of Care:  I have reviewed medical records, discussed with care team, and met with patient and son Derrick Sanchez at bedside). This NP met with patient and son multiple times last week during his admission. Notes reviewed.   Dr. Darrick Meigs at bedside and update patient/son on plan of care this hospitalization, including continuing antibiotics for  now, lasix for possible CHF on chest xray, and SLP/dietician consults.   Again introduced Palliative Medicine as specialized medical care for people living with serious illness. It focuses on providing relief from the symptoms and stress of a serious illness. The goal is to improve quality of life for both the patient and the family.  We briefly discussed his father's health decline since back surgery about four years ago. Prior to hospitalization, living home in [place] with wife. Baseline, bed/wheelchair bound. He requires assist with most ADL's. Derrick Sanchez is an only child and has hired caregivers for 9 hours a day. Derrick Sanchez describes his father as "active" and independent prior to back surgery/declining health secondary to infections.   Discussed hospital diagnoses, interventions, and underlying co-morbidities.   Advanced directives, concepts specific to code status, artifical feeding and hydration, and rehospitalization were considered and discussed. I have had multiple detailed conversations with son regarding code status. Derrick Sanchez changed his father's code status to DNR when discharged to nursing facility but requested it be changed to FULL when he arrived back to the hospital. Derrick Sanchez speaks of his concerns with resuscitation and life support not changing the inevitable. I agreed and again educated on medical recommendation for DNR/DNI with age, frailty, underlying co-morbidities, poor outcomes of CPR, and poor chance of survival/meaningful recovery. Discussed limited code as an  option. Derrick Sanchez wishes to further discuss with his father this afternoon.   The difference between aggressive medical intervention and comfort care was considered. After multiple discussions in the last week, Derrick Sanchez has a better understanding of guarded prognosis with co-morbidities, aspiration risk, recurrent aspiration pneumonia, and overall debility. Rick asked about hospice services and considering this option when medically stable for  discharge.   Educated on palliative versus hospice options including hospice philosophy. Educated on allowing comfort, quality, and dignity at EOL.   Therapeutic listening. PMT contact information given.    Addendum 1450: After further discussion with his father this afternoon, Derrick Sanchez called PMT phone to request limited code status with NO resuscitation and NO intubation/mechanical ventilation. In the event of a cardiac or respiratory arrest, Rick requests BiPAP, medications, and/or defibrillation/cardioversion. Updated Dr. Darrick Meigs via text page.    SUMMARY OF RECOMMENDATIONS    After detailed conversation with son, patient changed to limited code. NO resuscitation or intubation/mechanical ventilation. In the event of cardiac or respiratory arrest, YES call rapid response, use BiPAP, ACLS medications, and/or defibrillation/cardioversion if indicated.   Watchful waiting. Continue to treat the treatable.   PMT will follow. Son considering palliative versus hospice options when stable for discharge. Son understands guarded long-term prognosis with co-morbidities, deconditioning, aspiration risk and recurrent aspiration pneumonia.   Code Status/Advance Care Planning:  Limited code-no CPR and no intubation/mechanical ventilation  Symptom Management:   Per attending  Palliative Prophylaxis:   Aspiration, Delirium Protocol, Oral Care and Turn Reposition  Psycho-social/Spiritual:   Desire for further Chaplaincy support: yes  Additional Recommendations: Caregiving  Support/Resources and Education on Hospice  Prognosis:   Unable to determine  Discharge Planning: To Be Determined      Primary Diagnoses: Present on Admission: . HCAP (healthcare-associated pneumonia) . Pressure injury of skin   I have reviewed the medical record, interviewed the patient and family, and examined the patient. The following aspects are pertinent.  Past Medical History:  Diagnosis Date  . Allergy   .  Arthritis   . Blood transfusion   . Candidiasis of the esophagus 07-17-2000   EGD  . Cataract    bil eyes  . Colon polyps 05-27-2002   Tubulovillous Adenoma-Colonoscopy  . Diverticulosis of colon (without mention of hemorrhage) 2008,2006,2003   Colonoscopy  . Esophageal reflux 2008   EGD  . Glaucoma   . Hiatal hernia 2002,2003,2013   EGD  . Hyperparathyroidism, unspecified ([place])   . Hypertension   . Internal hemorrhoids without mention of complication 3295   Colonoscopy  . Multiple falls    Pt falls asleep without warning and has encounter multiple falls  . Obstructive sleep apnea (adult) (pediatric)   . Other vitamin B12 deficiency anemia   . Pneumonia    years ago  . Restless legs syndrome (RLS)   . Stricture and stenosis of esophagus 05-27-2002   EGD  . Unspecified asthma(493.90)    Social History   Socioeconomic History  . Marital status: Married    Spouse name: Mary  . Number of children: 1  . Years of education: 3  . Highest education level: Not on file  Occupational History  . Occupation: Retired    Comment: Librarian, academic in supply room  Social Needs  . Financial resource strain: Not on file  . Food insecurity:    Worry: Not on file    Inability: Not on file  . Transportation needs:    Medical: Not on file    Non-medical: Not on file  Tobacco Use  . Smoking status: Never Smoker  . Smokeless tobacco: Never Used  Substance and Sexual Activity  . Alcohol use: No    Alcohol/week: 0.0 oz  . Drug use: No  . Sexual activity: Not on file  Lifestyle  . Physical activity:    Days per week: Not on file    Minutes per session: Not on file  . Stress: Not on file  Relationships  . Social connections:    Talks on phone: Not on file    Gets together: Not on file    Attends religious service: Not on file    Active member of club or organization: Not on file    Attends meetings of clubs or organizations: Not on file    Relationship status: Not on file  Other  Topics Concern  . Not on file  Social History Narrative   Lives at home with wife, [facility]      Caffeine use - soda 2 a day   Family History  Problem Relation Age of Onset  . Heart disease Father   . Brain cancer Unknown        3 brothers  . Heart disease Sister   . Leukemia Brother   . Colon cancer Neg Hx   . Esophageal cancer Neg Hx   . Rectal cancer Neg Hx   . Stomach cancer Neg Hx   . Diabetes Neg Hx   . Kidney disease Neg Hx   . Liver disease Neg Hx    Scheduled Meds: . ferrous sulfate  325 mg Oral QPC breakfast  . guaiFENesin  600 mg Oral BID  . ipratropium-albuterol  3 mL Nebulization TID  . lactobacillus  1 g Oral TID WC  . latanoprost  1 drop Both Eyes QHS  . pantoprazole  40 mg Oral Daily  . pramipexole  1 mg Oral TID  . rivaroxaban  20 mg Oral QAC breakfast  . sodium chloride HYPERTONIC  4 mL Nebulization TID   Continuous Infusions: . piperacillin-tazobactam (ZOSYN)  IV 3.375 g (10/01/17 0602)  . vancomycin     PRN Meds:.acetaminophen, HYDROcodone-acetaminophen, ipratropium-albuterol Medications Prior to Admission:  Prior to Admission medications   Medication Sig Start Date End Date Taking? Authorizing Provider  acetaminophen (TYLENOL) 325 MG tablet Take 2 tablets (650 mg total) by mouth every 6 (six) hours as needed for mild pain (or Fever >/= 101). 09/29/17  Yes Emokpae, Courage, MD  albuterol (PROVENTIL HFA) 108 (90 Base) MCG/ACT inhaler Inhale 2 puffs into the lungs every 6 (six) hours as needed for wheezing.   Yes [provider]  albuterol (PROVENTIL) (2.5 MG/3ML) 0.083% nebulizer solution Take 3 mLs (2.5 mg total) by nebulization 2 (two) times daily. 09/29/17  Yes Emokpae, Courage, MD  amLODipine (NORVASC) 2.5 MG tablet Take 1 tablet (2.5 mg total) by mouth daily. 09/29/17  Yes Emokpae, Courage, MD  B Complex-C (B-COMPLEX WITH VITAMIN C) tablet Take 1 tablet by mouth daily.   Yes [provider]  bimatoprost (LUMIGAN) 0.01 % SOLN Place 1  drop into both eyes at bedtime.   Yes [provider]  esomeprazole (NEXIUM) 40 MG capsule Take 40 mg by mouth daily.  03/28/16  Yes Irene Shipper, MD  ferrous sulfate 325 (65 FE) MG tablet Take 1 tablet by mouth daily.   Yes [provider]  gabapentin (NEURONTIN) 300 MG capsule Take 300-600 mg by mouth 3 (three) times daily. Take 2 capsules (600 mg) every morning, 1 capsule (300  mg) at noon, 2 capsules (600 mg) at bedtime   Yes [provider]  guaiFENesin (ROBITUSSIN) 100 MG/5ML SOLN Take 10 mLs by mouth 2 (two) times daily.   Yes [provider]  lactobacillus (FLORANEX/LACTINEX) PACK Take 1 packet (1 g total) by mouth 3 (three) times daily with meals. 09/29/17  Yes Emokpae, Courage, MD  lactose free nutrition (BOOST PLUS) LIQD Take 237 mLs by mouth 3 (three) times daily between meals. 09/29/17  Yes Emokpae, Courage, MD  levofloxacin (LEVAQUIN) 500 MG tablet Take 1 tablet (500 mg total) by mouth daily. 09/29/17  Yes Emokpae, Courage, MD  ondansetron (ZOFRAN) 4 MG tablet Take 1 tablet (4 mg total) by mouth every 6 (six) hours as needed for nausea. 09/29/17  Yes Emokpae, Courage, MD  oxyCODONE-acetaminophen (PERCOCET) 5-325 MG tablet Take 1 tablet by mouth every 6 (six) hours as needed for moderate pain. 09/29/17  Yes Emokpae, Courage, MD  pramipexole (MIRAPEX) 1 MG tablet Take 1 mg by mouth 3 (three) times daily.    Yes [provider]  senna-docusate (SENOKOT-S) 8.6-50 MG tablet Take 2 tablets by mouth at bedtime. 09/29/17 09/29/18 Yes Emokpae, Courage, MD  XARELTO 20 MG TABS tablet Take 20 mg by mouth daily. 08/26/17  Yes [provider]  guaiFENesin (MUCINEX) 600 MG 12 hr tablet Take 1 tablet (600 mg total) by mouth 2 (two) times daily for 10 days. Patient not taking: Reported on 09/30/2017 09/29/17 10/09/17  Roxan Hockey, MD   No Known Allergies Review of Systems  Constitutional:       Patient denies complaints  All other systems reviewed and  are negative.  Physical Exam  Constitutional: He is oriented to person, place, and time. He is cooperative. He appears ill.  HENT:  Head: Normocephalic and atraumatic.  Pulmonary/Chest: No accessory muscle usage. No tachypnea. No respiratory distress.  Neurological: He is alert and oriented to person, place, and time.  Skin: Skin is warm and dry. There is pallor.  Psychiatric: He has a normal mood and affect. His speech is normal and behavior is normal.  Nursing note and vitals reviewed.  Vital Signs: BP (!) 81/48 (BP Location: Left Arm)   Pulse 63   Temp 97.6 F (36.4 C) (Oral)   Resp 18   Ht '6\' 2"'  (1.88 m)   Wt 63.3 kg (139 lb 8.8 oz)   SpO2 90%   BMI 17.92 kg/m  Pain Scale: Faces   Pain Score: Asleep  SpO2: SpO2: 90 % O2 Device:SpO2: 90 % O2 Flow Rate: .O2 Flow Rate (L/min): 5 L/min  IO: Intake/output summary:   Intake/Output Summary (Last 24 hours) at 10/01/2017 1026 Last data filed at 10/01/2017 0786 Gross per 24 hour  Intake 50 ml  Output 775 ml  Net -725 ml    LBM: Last BM Date: 09/30/17 Baseline Weight: Weight: 63.3 kg (139 lb 8.8 oz) Most recent weight: Weight: 63.3 kg (139 lb 8.8 oz)     Palliative Assessment/Data: PPS 30%   Flowsheet Rows     Most Recent Value  Intake Tab  Referral Department  Hospitalist  Unit at Time of Referral  Med/Surg Unit  Palliative Care Type  Return patient Palliative Care  Reason for referral  Clarify Goals of Care  Date first seen by Palliative Care  10/01/17  Clinical Assessment  Palliative Performance Scale Score  30%  Psychosocial & Spiritual Assessment  Palliative Care Outcomes  Patient/Family meeting held?  Yes  Who was at the meeting?  patient  and son  Palliative Care Outcomes  Clarified goals of care, ACP counseling assistance, Provided psychosocial or spiritual support, Provided end of life care assistance      Time In: 0940-1040, 1440-1455 Time Total: 75 min Greater than 50%  of this time was spent  counseling and coordinating care related to the above assessment and plan.  Signed by:  Ihor Dow, FNP-C Palliative Medicine Team  Phone: [phone] Fax: [phone]  Please contact Palliative Medicine Team phone at [phone] for questions and concerns.  For individual provider: See Shea Evans

## 2017-10-01 NOTE — Progress Notes (Addendum)
Bilateral upper extremity completed. Inconclusive study due to edema and sub optimal positioning of the arms due to contractures and pain. Veins able to be imaged appeared to be thrombus free. Unable to visualize the basilic bilaterally, The left IJV, subclavian, and brachial veins well enough to fully evaluate.  [facility], [place] 10/01/2017 3:54 PM

## 2017-10-01 NOTE — Progress Notes (Addendum)
[facility]  PROGRESS NOTE  Derrick Sanchez:[id number] DOB: Sep 18, 1930 DOA: 09/30/2017 PCP: [name], MD   Brief HPI:   84 y.o. male with medical history significant for spinal epidural abscess, lumbar radiculopathy, paraparesis of lower limbs, chronic osteomyelitis of lumbar spine, frequent falls, Malnutrition, failure to thrive who [facility] from SNF with complaints of dyspnea and persistent productive cough.  Patient was discharged from the hospital yesterday 09/29/17 after being admitted for right upper lobe community-acquired pneumonia and vertebral osteomyelitis/discitis discharged on oral Levaquin.  He denies chest pain or palpitations.  Admitted for suspected aspiration pneumonia versus pneumonitis versus HCAP.   Subjective   Patient seen and examined, complains of shortness of breath.    Assessment/Plan:     1. Acute hypoxic respiratory failure-multifactorial from aspiration pneumonia, underlying diastolic CHF.  Patient started on vancomycin and Zosyn.  Swallow evaluation has been ordered.  Continue duo nebs every 6 hours.  Cannot give Lasix at this time due to hypotension 2. Acute on chronic diastolic CHF-patient's weight has gone up 11 kg since previous admission, he does have anasarca along with bilateral infiltrates noted on the chest x-ray.  Will obtain BNP, strict I's and O's.  Hold Lasix at this time as patient is hypotensive. 3. Failure to thrive/malnutrition -Albumin is 1.4, will get nutrition consult, palliative care has been consulted. 4. Chronic low back pain/vertebral osteomyelitis-continue pain management, on chronic suppressive therapy with Levaquin 5. History of lower extremity DVT-continue Xarelto    DVT prophylaxis: Xarelto  Code Status: Full code  Family Communication: Discussed in detail with patient's son at bedside.  Disposition Plan: SNF once medically stable   Consultants:  None   Procedures:  None   Antibiotics:    Anti-infectives (From admission, onward)   Start     Dose/Rate Route Frequency Ordered Stop   10/01/17 1800  vancomycin (VANCOCIN) IVPB 1000 mg/200 mL premix     1,000 mg 200 mL/hr over 60 Minutes Intravenous Every 36 hours 09/30/17 2223     10/01/17 0800  levofloxacin (LEVAQUIN) tablet 500 mg  Status:  Discontinued     500 mg Oral Daily before breakfast 09/30/17 1521 09/30/17 2223   09/30/17 2300  piperacillin-tazobactam (ZOSYN) IVPB 3.375 g     3.375 g 12.5 mL/hr over 240 Minutes Intravenous Every 8 hours 09/30/17 2223     09/30/17 1315  vancomycin (VANCOCIN) IVPB 1000 mg/200 mL premix     1,000 mg 200 mL/hr over 60 Minutes Intravenous  Once 09/30/17 1314 09/30/17 2054   09/30/17 1315  piperacillin-tazobactam (ZOSYN) IVPB 3.375 g     3.375 g 100 mL/hr over 30 Minutes Intravenous  Once 09/30/17 1314 09/30/17 1821       Objective   Vitals:   10/01/17 0524 10/01/17 0734 10/01/17 1100 10/01/17 1104  BP:   (!) 83/55 (!) 80/41  Pulse:   61 63  Resp:      Temp:      TempSrc:      SpO2: 92% 90%    Weight:      Height:        Intake/Output Summary (Last 24 hours) at 10/01/2017 1154 Last data filed at 10/01/2017 9562 Gross per 24 hour  Intake 50 ml  Output 775 ml  Net -725 ml   Filed Weights   10/01/17 0505  Weight: 63.3 kg (139 lb 8.8 oz)     Physical Examination:   Physical Exam: Eyes: No icterus, extraocular muscles intact  Mouth: Oral  mucosa is moist, no lesions on palate,  Neck: Supple, no deformities, masses, or tenderness Lungs: Normal respiratory effort, Decreased breath sounds bilaterally Heart: Regular rate and rhythm, S1 and S2 normal, no murmurs, rubs auscultated Abdomen: BS normoactive,soft,nondistended,non-tender to palpation,no organomegaly Extremities: anasarca, 1+ edema in lower extremities, trace edema of upper extremities Neuro : Alert and oriented to time, place and person, No focal deficits Skin: No rashes seen on exam    Data Reviewed: I  have personally reviewed following labs and imaging studies  CBG: No results for input(s): GLUCAP in the last 168 hours.  CBC: Recent Labs  Lab 09/26/17 0509 09/29/17 0541 09/30/17 1309 10/01/17 0456  WBC 5.1 5.5 9.0 7.1  NEUTROABS  --   --  7.8*  --   HGB 9.4* 8.9* 9.8* 8.2*  HCT 31.2* 28.7* 32.2* 26.4*  MCV 90.2 89.7 89.7 89.5  PLT 155 140* 141* 118*    Basic Metabolic Panel: Recent Labs  Lab 09/26/17 0509 09/29/17 0541 09/30/17 1309 10/01/17 0456  NA 141 138 137 137  K 3.7 4.0 4.6 4.2  CL 109 108 107 106  CO2 22 24 24 23   GLUCOSE 84 78 88 81  BUN 25* 17 17 19   CREATININE 0.83 0.75 0.75 0.80  CALCIUM 7.7* 7.5* 7.6* 7.4*    No results found for this or any previous visit (from the past 240 hour(s)).   Liver Function Tests: Recent Labs  Lab 10/01/17 0456  AST 19  ALT 8*  ALKPHOS 76  BILITOT 0.6  PROT 5.2*  ALBUMIN 1.4*   No results for input(s): LIPASE, AMYLASE in the last 168 hours. No results for input(s): AMMONIA in the last 168 hours.  Cardiac Enzymes: No results for input(s): CKTOTAL, CKMB, CKMBINDEX, TROPONINI in the last 168 hours. BNP (last 3 results) Recent Labs    09/22/17 0553  BNP 241.4*    ProBNP (last 3 results) No results for input(s): PROBNP in the last 8760 hours.    Studies: Dg Chest 2 View  Result Date: 09/30/2017 CLINICAL DATA:  Cough, low oxygen saturation, non communicative patient EXAM: CHEST - 2 VIEW COMPARISON:  Chest x-ray of 09/29/2017 FINDINGS: The lungs are not well aerated and there has been and increase in airspace disease at both lung bases. This may be due to developing pneumonia and possible small effusions bilaterally. Also and element of congestive heart failure superimposed cannot be excluded. The heart is mildly enlarged. Old right rib fractures are present and there is a chronic dislocation of the right shoulder. IMPRESSION: 1. Poor aeration with bibasilar airspace disease. Suspect pneumonia and possible  effusions but superimposed CHF cannot be excluded. 2. Stable cardiomegaly. 3. Chronic dislocation of the right shoulder. Electronically Signed   By: Ivar Drape M.D.   On: 09/30/2017 13:41    Scheduled Meds: . ferrous sulfate  325 mg Oral QPC breakfast  . guaiFENesin  600 mg Oral BID  . ipratropium-albuterol  3 mL Nebulization BID  . lactobacillus  1 g Oral TID WC  . latanoprost  1 drop Both Eyes QHS  . pantoprazole  40 mg Oral Daily  . pramipexole  1 mg Oral TID  . rivaroxaban  20 mg Oral QAC breakfast  . sodium chloride HYPERTONIC  4 mL Nebulization BID      Time spent: 20 min  [facility] Pager [phone]. If 7PM-7AM, please contact night-coverage at [email or web address], Office  [phone]  password [facility]  10/01/2017, 11:54 AM  LOS:  1 day

## 2017-10-01 NOTE — Consult Note (Deleted)
   [facility] CM Inpatient Consult   10/01/2017  Derrick Sanchez 02/07/1931 [id number]   Patient screened for [facility] services due to frequent hospitalizations.   Went to bedside to speak with patient and wife. However, Mrs. Zapf states " we are having a problem right now". She was in the process of calling nursing.  Asked Probation officer to come back later.    Marthenia Rolling, MSN-Ed, RN,BSN [facility] Liaison [phone]

## 2017-10-01 NOTE — Progress Notes (Signed)
Initial Nutrition Assessment  DOCUMENTATION CODES:   Severe malnutrition in context of chronic illness, Underweight  INTERVENTION:  - Will order Ensure Enlive BID, each supplement provides 350 kcal and 20 grams of protein. - Will order daily multivitamin with minerals.  - Continue to encourage PO intakes.   NUTRITION DIAGNOSIS:   Severe Malnutrition related to chronic illness(paraparesis of BLE) as evidenced by severe muscle depletion, severe fat depletion.  GOAL:   Patient will meet greater than or equal to 90% of their needs  MONITOR:   PO intake, Supplement acceptance, Weight trends, Labs, Skin  REASON FOR ASSESSMENT:   Malnutrition Screening Tool, Consult Assessment of nutrition requirement/status  ASSESSMENT:   84 y.o. male with medical history significant for spinal epidural abscess, lumbar radiculopathy, paraparesis of lower limbs, chronic osteomyelitis of lumbar spine, frequent falls, malnutrition, failure to thrive. He presented to the ED from SNF with complaints of dyspnea and persistent productive cough. Patient was discharged from the hospital the day prior (09/29/17) after being admitted for right upper lobe CAP and vertebral osteomyelitis/discitis. Admitted for suspected aspiration pneumonia versus pneumonitis versus HCAP.  BMI indicates underweight status. No intakes documented since admission. Noted pt consumed 50% of breakfast and lunch on 4/16 (prior to d/c). Son at bedside and provides nearly all information as pt was on the phone. He reports that RN at [facility] said that pt ate well for breakfast yesterday. Son reports that appetite and intakes were steadily increasing during prior hospitalization despite pt requiring Dysphagia 1 diet during that time as well. He states that pt consumed all of his grits and eggs this AM for breakfast and consumed part of a [employer].  Pt does not care for Magic Cup and is not overly a fan of Ensure but is willing to drink  strawberry flavor over ice. Encouraged son to have pt sip on Ensure throughout the day and to use Ensure to take medications. Also talked about other protein-rich foods available on current diet order.   NFPE outlined below. Edema to arms prohibited being able to truly assess that area.   Medications reviewed; 325 mg ferrous sulfate/day, 40 mg Protonix/day. Labs reviewed; Ca: 7.4 mg/dL.     NUTRITION - FOCUSED PHYSICAL EXAM:    Most Recent Value  Orbital Region  Severe depletion  Upper Arm Region  Unable to assess [edema to arms]  Thoracic and Lumbar Region  Unable to assess  Buccal Region  Moderate depletion  Temple Region  Moderate depletion  Clavicle Bone Region  Severe depletion  Clavicle and Acromion Bone Region  Severe depletion  Scapular Bone Region  Severe depletion  Dorsal Hand  Unable to assess [edema to arms]  Patellar Region  Severe depletion  Anterior Thigh Region  Severe depletion  Posterior Calf Region  Severe depletion  Edema (RD Assessment)  Moderate  Hair  Reviewed  Eyes  Reviewed  Mouth  Reviewed  Skin  Reviewed  Nails  Reviewed       Diet Order:  DIET - DYS 1 Room service appropriate? Yes; Fluid consistency: Thin  EDUCATION NEEDS:   No education needs have been identified at this time  Skin:  Skin Assessment: Skin Integrity Issues: Skin Integrity Issues:: Stage III, Unstageable, Other (Comment) Stage I: R hip Stage III: L IT x2, sacrum Unstageable: full thickness to L hip and L back Other: L venous stasis ulcer  Last BM:  4/17  Height:   Ht Readings from Last 1 Encounters:  09/30/17 6\' 2"  (1.88  m)    Weight:   Wt Readings from Last 1 Encounters:  10/01/17 139 lb 8.8 oz (63.3 kg)    Ideal Body Weight:  86.36 kg  BMI:  Body mass index is 17.92 kg/m.  Estimated Nutritional Needs:   Kcal:  2090-2280 (33-36 kcal/kg)  Protein:  108-125 grams (1.7-2 grams/kg)  Fluid:  >/= 2 L/day      Jarome Matin, MS, RD, LDN,  [facility] Inpatient Clinical Dietitian Pager # [phone] After hours/weekend pager # [phone]

## 2017-10-01 NOTE — Progress Notes (Signed)
  Paged attending MD related to patients urinary output of 50ms since the patient had an in and out cath done this morning at 0645. Upon attempting to insert catheter, RN saw blood returned, and was met with resistance.   RN called MD to inform him of findings, as well as blood returned. RN requested if we could just watch him, and continue to bladder scan the patient to monitor bladder volume.   RN will pass along that patient may need a coude cath if foley catheter is indicated.

## 2017-10-01 NOTE — Progress Notes (Signed)
Pt upper extremity edema has worsened since admission. Left extremity is weeping. This RN elevated both arms on pillows and notified the MD on call. Awaiting orders. Will continue to monitor patient.

## 2017-10-02 LAB — CBC
HEMATOCRIT: 25.1 % — AB (ref 39.0–52.0)
Hemoglobin: 7.8 g/dL — ABNORMAL LOW (ref 13.0–17.0)
MCH: 27.6 pg (ref 26.0–34.0)
MCHC: 31.1 g/dL (ref 30.0–36.0)
MCV: 88.7 fL (ref 78.0–100.0)
Platelets: 116 10*3/uL — ABNORMAL LOW (ref 150–400)
RBC: 2.83 MIL/uL — ABNORMAL LOW (ref 4.22–5.81)
RDW: 18.3 % — AB (ref 11.5–15.5)
WBC: 6.6 10*3/uL (ref 4.0–10.5)

## 2017-10-02 LAB — COMPREHENSIVE METABOLIC PANEL
ALBUMIN: 1.4 g/dL — AB (ref 3.5–5.0)
ALT: 8 U/L — ABNORMAL LOW (ref 17–63)
ANION GAP: 6 (ref 5–15)
AST: 19 U/L (ref 15–41)
Alkaline Phosphatase: 73 U/L (ref 38–126)
BUN: 21 mg/dL — AB (ref 6–20)
CHLORIDE: 107 mmol/L (ref 101–111)
CO2: 22 mmol/L (ref 22–32)
Calcium: 7.2 mg/dL — ABNORMAL LOW (ref 8.9–10.3)
Creatinine, Ser: 0.85 mg/dL (ref 0.61–1.24)
GFR calc Af Amer: >60 mL/min (ref >60)
GFR calc non Af Amer: >60 mL/min (ref >60)
GLUCOSE: 75 mg/dL (ref 65–99)
POTASSIUM: 4.3 mmol/L (ref 3.5–5.1)
SODIUM: 135 mmol/L (ref 135–145)
Total Bilirubin: 0.3 mg/dL (ref 0.3–1.2)
Total Protein: 5.2 g/dL — ABNORMAL LOW (ref 6.5–8.1)

## 2017-10-02 LAB — EXPECTORATED SPUTUM ASSESSMENT W GRAM STAIN, RFLX TO RESP C

## 2017-10-02 LAB — EXPECTORATED SPUTUM ASSESSMENT W REFEX TO RESP CULTURE

## 2017-10-02 MED ORDER — FUROSEMIDE 20 MG PO TABS
20.0000 mg | ORAL_TABLET | Freq: Two times a day (BID) | ORAL | Status: DC
  Administered 2017-10-02 – 2017-10-07 (×10): 20 mg via ORAL
  Filled 2017-10-02 (×10): qty 1

## 2017-10-02 MED ORDER — FUROSEMIDE 20 MG PO TABS
20.0000 mg | ORAL_TABLET | Freq: Every day | ORAL | Status: DC
  Administered 2017-10-02: 20 mg via ORAL
  Filled 2017-10-02: qty 1

## 2017-10-02 MED ORDER — METHYLPREDNISOLONE SODIUM SUCC 40 MG IJ SOLR
40.0000 mg | Freq: Three times a day (TID) | INTRAMUSCULAR | Status: DC
  Administered 2017-10-02 – 2017-10-07 (×16): 40 mg via INTRAVENOUS
  Filled 2017-10-02 (×16): qty 1

## 2017-10-02 NOTE — Progress Notes (Signed)
[facility]  PROGRESS NOTE  Derrick Sanchez [id number] DOB: 26-Oct-1930 DOA: 09/30/2017 PCP: [name], MD   Brief HPI:   84 y.o. male with medical history significant for spinal epidural abscess, lumbar radiculopathy, paraparesis of lower limbs, chronic osteomyelitis of lumbar spine, frequent falls, Malnutrition, failure to thrive who presented to ED [facility] from SNF with complaints of dyspnea and persistent productive cough.  Patient was discharged from the hospital yesterday 09/29/17 after being admitted for right upper lobe community-acquired pneumonia and vertebral osteomyelitis/discitis discharged on oral Levaquin.  He denies chest pain or palpitations.  Admitted for suspected aspiration pneumonia versus pneumonitis versus HCAP.   Subjective   Patient seen and examined, breathing is improved.  Looks more alert.   Assessment/Plan:     1. Acute hypoxic respiratory failure-improving, multifactorial from aspiration pneumonia, underlying diastolic CHF.  Patient started on vancomycin and Zosyn.  Swallow evaluation done which showed severe aspiration risk, patient is on dysphagia 1 thin liquid.  Continue duo nebs every 6 hours.  Cannot give Lasix at this time due to hypotension. 2. Acute on chronic diastolic CHF-patient's weight has gone up 11 kg since previous admission, he does have anasarca along with bilateral infiltrates noted on the chest x-ray.  Will obtain BNP, strict I's and O's.  Will restart low-dose of Lasix 20 mg p.o. bid 3. Failure to thrive/malnutrition -Albumin is 1.4, nutrition consulted, started on Ensure supplements t, palliative care has been following.  Patient is limited code 4. Chronic low back pain/vertebral osteomyelitis-continue pain management, on chronic suppressive therapy with Levaquin 5. History of lower extremity DVT-continue Xarelto    DVT prophylaxis: Xarelto  Code Status: Full code  Family Communication: Discussed in detail with patient's son at  bedside.  Disposition Plan: SNF once medically stable   Consultants:  None   Procedures:  None   Antibiotics:   Anti-infectives (From admission, onward)   Start     Dose/Rate Route Frequency Ordered Stop   10/01/17 1800  vancomycin (VANCOCIN) IVPB 1000 mg/200 mL premix  Status:  Discontinued     1,000 mg 200 mL/hr over 60 Minutes Intravenous Every 36 hours 09/30/17 2223 10/01/17 1913   10/01/17 0800  levofloxacin (LEVAQUIN) tablet 500 mg  Status:  Discontinued     500 mg Oral Daily before breakfast 09/30/17 1521 09/30/17 2223   09/30/17 2300  piperacillin-tazobactam (ZOSYN) IVPB 3.375 g     3.375 g 12.5 mL/hr over 240 Minutes Intravenous Every 8 hours 09/30/17 2223     09/30/17 1315  vancomycin (VANCOCIN) IVPB 1000 mg/200 mL premix     1,000 mg 200 mL/hr over 60 Minutes Intravenous  Once 09/30/17 1314 09/30/17 2054   09/30/17 1315  piperacillin-tazobactam (ZOSYN) IVPB 3.375 g     3.375 g 100 mL/hr over 30 Minutes Intravenous  Once 09/30/17 1314 09/30/17 1821       Objective   Vitals:   10/01/17 2146 10/02/17 0450 10/02/17 0454 10/02/17 0747  BP: (!) 100/51  105/64   Pulse: 79 (!) 124 65   Resp: (!) 24 16 16    Temp: 99.2 F (37.3 C)  98.8 F (37.1 C)   TempSrc: Oral  Oral   SpO2: 95% (!) 79% 92% (!) 89%  Weight:   64.3 kg (141 lb 12.1 oz)   Height:        Intake/Output Summary (Last 24 hours) at 10/02/2017 1355 Last data filed at 10/02/2017 0615 Gross per 24 hour  Intake 630 ml  Output 775 ml  Net -145 ml   Filed Weights   10/01/17 0505 10/02/17 0454  Weight: 63.3 kg (139 lb 8.8 oz) 64.3 kg (141 lb 12.1 oz)     Physical Examination:  Physical Exam: Eyes: No icterus, extraocular muscles intact  Mouth: Oral mucosa is moist, no lesions on palate,  Neck: Supple, no deformities, masses, or tenderness Lungs: Normal respiratory effort, decreased breath sounds at lung bases Heart: Regular rate and rhythm, S1 and S2 normal, no murmurs, rubs  auscultated Abdomen: BS normoactive,soft,nondistended,non-tender to palpation,no organomegaly Extremities: anasarca, trace edema noted in the upper and lower extremities Neuro : Alert and oriented to time, place and person, No focal deficits    Data Reviewed: I have personally reviewed following labs and imaging studies  CBG: No results for input(s): GLUCAP in the last 168 hours.  CBC: Recent Labs  Lab 09/26/17 0509 09/29/17 0541 09/30/17 1309 10/01/17 0456 10/02/17 0522  WBC 5.1 5.5 9.0 7.1 6.6  NEUTROABS  --   --  7.8*  --   --   HGB 9.4* 8.9* 9.8* 8.2* 7.8*  HCT 31.2* 28.7* 32.2* 26.4* 25.1*  MCV 90.2 89.7 89.7 89.5 88.7  PLT 155 140* 141* 118* 116*    Basic Metabolic Panel: Recent Labs  Lab 09/26/17 0509 09/29/17 0541 09/30/17 1309 10/01/17 0456 10/02/17 0522  NA 141 138 137 137 135  K 3.7 4.0 4.6 4.2 4.3  CL 109 108 107 106 107  CO2 22 24 24 23 22   GLUCOSE 84 78 88 81 75  BUN 25* 17 17 19  21*  CREATININE 0.83 0.75 0.75 0.80 0.85  CALCIUM 7.7* 7.5* 7.6* 7.4* 7.2*    Recent Results (from the past 240 hour(s))  MRSA PCR Screening     Status: None   Collection Time: 10/01/17  8:49 AM  Result Value Ref Range Status   MRSA by PCR NEGATIVE NEGATIVE Final    Comment:        The GeneXpert MRSA Assay (FDA approved for NASAL specimens only), is one component of a comprehensive MRSA colonization surveillance program. It is not intended to diagnose MRSA infection nor to guide or monitor treatment for MRSA infections. Performed at [facility], [place] [address], [place], [place] [address]   Culture, expectorated sputum-assessment     Status: None   Collection Time: 10/02/17  1:15 AM  Result Value Ref Range Status   Specimen Description SPUTUM  Final   Special Requests NONE  Final   Sputum evaluation   Final    THIS SPECIMEN IS ACCEPTABLE FOR SPUTUM CULTURE Performed at [facility], [place] [address] [place], [place]  [address]    Report Status 10/02/2017 FINAL  Final     Liver Function Tests: Recent Labs  Lab 10/01/17 0456 10/02/17 0522  AST 19 19  ALT 8* 8*  ALKPHOS 76 73  BILITOT 0.6 0.3  PROT 5.2* 5.2*  ALBUMIN 1.4* 1.4*   No results for input(s): LIPASE, AMYLASE in the last 168 hours. No results for input(s): AMMONIA in the last 168 hours.  Cardiac Enzymes: No results for input(s): CKTOTAL, CKMB, CKMBINDEX, TROPONINI in the last 168 hours. BNP (last 3 results) Recent Labs    09/22/17 0553 10/01/17 1328  BNP 241.4* 82.4    ProBNP (last 3 results) No results for input(s): PROBNP in the last 8760 hours.    Studies: No results found.  Scheduled Meds: . feeding supplement (ENSURE ENLIVE)  237 mL Oral BID BM  . ferrous  sulfate  325 mg Oral QPC breakfast  . furosemide  20 mg Oral Daily  . guaiFENesin  600 mg Oral BID  . ipratropium-albuterol  3 mL Nebulization BID  . lactobacillus  1 g Oral TID WC  . latanoprost  1 drop Both Eyes QHS  . methylPREDNISolone (SOLU-MEDROL) injection  40 mg Intravenous Q8H  . multivitamin with minerals  1 tablet Oral Daily  . pantoprazole  40 mg Oral Daily  . pramipexole  1 mg Oral TID  . rivaroxaban  20 mg Oral QAC breakfast  . sodium chloride HYPERTONIC  4 mL Nebulization BID      Time spent: 20 min  [facility] Pager [phone]. If 7PM-7AM, please contact night-coverage at [email or web address], Office  [phone]  password [email or web address]  10/02/2017, 1:55 PM  LOS: 2 days

## 2017-10-02 NOTE — Progress Notes (Signed)
Pharmacy Antibiotic Note  Derrick Sanchez is a 84 y.o. male admitted on 09/30/2017 with HCAP and suspected aspiration.  Pharmacy has been consulted for Vancomycin and Zosyn dosing. Vanc recently stopped for negative MRSA PCR.  Today, 10/02/2017:  WBC stable WNL  Remains afebrile  SCr stable  Still on O2 but weaning slowly; suspect will need chronic O2 at discharge based on low baseline oxygenation  Plan:  Continue Zosyn 3.375 g IV q8 hrs by 4-hr infusion  Pharmacy will sign off as SCr appears stable; will follow peripherally for renal dose adjustments and culture results  Height: 6\' 2"  (188 cm) Weight: 141 lb 12.1 oz (64.3 kg) IBW/kg (Calculated) : 82.2  Temp (24hrs), Avg:98.6 F (37 C), Min:97.7 F (36.5 C), Max:99.2 F (37.3 C)  Recent Labs  Lab 09/26/17 0509 09/29/17 0541 09/30/17 1309 10/01/17 0456 10/02/17 0522  WBC 5.1 5.5 9.0 7.1 6.6  CREATININE 0.83 0.75 0.75 0.80 0.85    Estimated Creatinine Clearance: 57.8 mL/min (by C-G formula based on SCr of 0.85 mg/dL).    No Known Allergies    Thank you for allowing pharmacy to be a part of this patient's care.  Reuel Boom, PharmD, BCPS [phone] 10/02/2017, 11:22 AM

## 2017-10-02 NOTE — Clinical Social Work Note (Signed)
Patient admitted from [facility] SNF. Patient verbalized plan to dc back to SNF when medically stable.  Patient recently assessed on 09/24/2017, no psychosocial changes reported see assessment below.Patient being followed by palliative. CSW will complete FL2 and follow up with [facility] SNF.  Clinical Social Work Assessment  Patient Details  Name: Derrick Sanchez MRN: [id number] Date of Birth: [birth year]  Date of referral:   10/02/2017               Reason for consult:   Patient from SNF                Permission sought to share information with:    Permission granted to share information::     Name::        Agency::     Relationship::     Contact Information:     Housing/Transportation Living arrangements for the past 2 months:    Source of Information:    Patient Interpreter Needed:    Criminal Activity/Legal Involvement Pertinent to Current Situation/Hospitalization:    Significant Relationships:    Lives with:    Do you feel safe going back to the place where you live?    Need for family participation in patient care:     Care giving concerns:  Patient lives at home with his spouse. Patient has caregivers from [place] until 11pm.    Social Worker assessment / plan: LCSW consulted for SNF placement.   Patient from home. Patient admitted for CAP.  LCSW met at bedside with patient. No family present. Patient is oriented. LCSW explained role and reason for visit.   Patient reports that he lives at home with his spouse. Patient states that he has caregivers in the home from 8am-11pm. Patient states he uses a motorized wheel chair at home and is able to transfer at baseline.   LCSW attempted to reach patients son, [name] for collateral.   OT recommends SNF at dc. PT has not evaluated patient at the time of assessment.   Patient states he would like to go home at dc.   PLAN: TBD    Employment status:    Insurance information:    PT Recommendations:    Information / Referral to  community resources:     Patient/Family's Response to care:  Patient is thankful for LCSW visit.   Patient/Family's Understanding of and Emotional Response to Diagnosis, Current Treatment, and Prognosis:  LCSW unable to assess patients understanding of diagnosis. Patient prefers to go home at dc.   Emotional Assessment Appearance:    Attitude/Demeanor/Rapport:    Affect (typically observed):    Orientation:    Alcohol / Substance use:    Psych involvement (Current and /or in the community):     Discharge Needs  Concerns to be addressed:    Readmission within the last 30 days:    Current discharge risk:    Barriers to Discharge:      Burnis Medin, LCSW 10/02/2017, 3:01 PM

## 2017-10-03 LAB — BASIC METABOLIC PANEL
ANION GAP: 9 (ref 5–15)
BUN: 24 mg/dL — AB (ref 6–20)
CHLORIDE: 106 mmol/L (ref 101–111)
CO2: 23 mmol/L (ref 22–32)
Calcium: 7.3 mg/dL — ABNORMAL LOW (ref 8.9–10.3)
Creatinine, Ser: 0.87 mg/dL (ref 0.61–1.24)
GFR calc Af Amer: >60 mL/min (ref >60)
GFR calc non Af Amer: >60 mL/min (ref >60)
Glucose, Bld: 120 mg/dL — ABNORMAL HIGH (ref 65–99)
POTASSIUM: 4.2 mmol/L (ref 3.5–5.1)
SODIUM: 138 mmol/L (ref 135–145)

## 2017-10-03 LAB — CBC
HEMATOCRIT: 24.4 % — AB (ref 39.0–52.0)
HEMOGLOBIN: 7.7 g/dL — AB (ref 13.0–17.0)
MCH: 27.7 pg (ref 26.0–34.0)
MCHC: 31.6 g/dL (ref 30.0–36.0)
MCV: 87.8 fL (ref 78.0–100.0)
Platelets: 110 10*3/uL — ABNORMAL LOW (ref 150–400)
RBC: 2.78 MIL/uL — AB (ref 4.22–5.81)
RDW: 17.9 % — ABNORMAL HIGH (ref 11.5–15.5)
WBC: 3.5 10*3/uL — AB (ref 4.0–10.5)

## 2017-10-03 NOTE — Progress Notes (Signed)
[facility]  PROGRESS NOTE  Derrick Sanchez JKK:[id number] DOB: 06-07-1931 DOA: 09/30/2017 PCP: [name], MD   Brief HPI:   84 y.o. male with medical history significant for spinal epidural abscess, lumbar radiculopathy, paraparesis of lower limbs, chronic osteomyelitis of lumbar spine, frequent falls, Malnutrition, failure to thrive who presented to ED [facility] from SNF with complaints of dyspnea and persistent productive cough.  Patient was discharged from the hospital yesterday 09/29/17 after being admitted for right upper lobe community-acquired pneumonia and vertebral osteomyelitis/discitis discharged on oral Levaquin.  He denies chest pain or palpitations.  Admitted for suspected aspiration pneumonia versus pneumonitis versus HCAP.   Subjective   Patient seen and examined, breathing better.  Was started on Lasix 20 mg twice daily yesterday with good diuretic response.   Assessment/Plan:     1. Acute hypoxic respiratory failure-improving, multifactorial from aspiration pneumonia, underlying diastolic CHF.  Patient started on vancomycin and Zosyn.  Swallow evaluation done which showed severe aspiration risk, patient is on dysphagia 1 thin liquid.  Continue duo nebs every 6 hours.  Also added Solu-Medrol 40 mg IV every 8 hours 2. Acute on chronic diastolic CHF-patient's weight has gone up 11 kg since previous admission, he does have anasarca along with bilateral infiltrates noted on the chest x-ray.  Will obtain BNP, strict I's and O's.  Patient is currently on Lasix 20 mg p.o. twice daily 3. Failure to thrive/malnutrition -Albumin is 1.4, nutrition consulted, started on Ensure supplements t, palliative care has been following.  Patient is limited code. 4. Chronic low back pain/vertebral osteomyelitis-continue pain management, on chronic suppressive therapy with Levaquin 5. History of lower extremity DVT-continue Xarelto    DVT prophylaxis: Xarelto  Code Status: Full code  Family  Communication: Discussed with patient's son at bedside  Disposition Plan: SNF once medically stable   Consultants:  None   Procedures:  None   Antibiotics:   Anti-infectives (From admission, onward)   Start     Dose/Rate Route Frequency Ordered Stop   10/01/17 1800  vancomycin (VANCOCIN) IVPB 1000 mg/200 mL premix  Status:  Discontinued     1,000 mg 200 mL/hr over 60 Minutes Intravenous Every 36 hours 09/30/17 2223 10/01/17 1913   10/01/17 0800  levofloxacin (LEVAQUIN) tablet 500 mg  Status:  Discontinued     500 mg Oral Daily before breakfast 09/30/17 1521 09/30/17 2223   09/30/17 2300  piperacillin-tazobactam (ZOSYN) IVPB 3.375 g     3.375 g 12.5 mL/hr over 240 Minutes Intravenous Every 8 hours 09/30/17 2223     09/30/17 1315  vancomycin (VANCOCIN) IVPB 1000 mg/200 mL premix     1,000 mg 200 mL/hr over 60 Minutes Intravenous  Once 09/30/17 1314 09/30/17 2054   09/30/17 1315  piperacillin-tazobactam (ZOSYN) IVPB 3.375 g     3.375 g 100 mL/hr over 30 Minutes Intravenous  Once 09/30/17 1314 09/30/17 1821       Objective   Vitals:   10/03/17 0556 10/03/17 0556 10/03/17 0833 10/03/17 1428  BP:  114/62  (!) 113/59  Pulse:  (!) 52  (!) 53  Resp:  (!) 22  (!) 24  Temp:  97.9 F (36.6 C)  (!) 97.5 F (36.4 C)  TempSrc:  Oral  Oral  SpO2:  95% 91% 94%  Weight: 64.1 kg (141 lb 5 oz)     Height:        Intake/Output Summary (Last 24 hours) at 10/03/2017 1813 Last data filed at 10/03/2017 0600 Gross per 24 hour  Intake 150 ml  Output 400 ml  Net -250 ml   Filed Weights   10/01/17 0505 10/02/17 0454 10/03/17 0556  Weight: 63.3 kg (139 lb 8.8 oz) 64.3 kg (141 lb 12.1 oz) 64.1 kg (141 lb 5 oz)     Physical Examination:  Physical Exam: Eyes: No icterus, extraocular muscles intact  Mouth: Oral mucosa is moist, no lesions on palate,  Neck: Supple, no deformities, masses, or tenderness Lungs: Normal respiratory effort, bilateral rhonchi auscultated Heart: Regular  rate and rhythm, S1 and S2 normal, no murmurs, rubs auscultated Abdomen: BS normoactive,soft,nondistended,non-tender to palpation,no organomegaly Extremities: Trace edema noted in the lower extremities Neuro : Alert and oriented to time, place and person, No focal deficits     Data Reviewed: I have personally reviewed following labs and imaging studies  CBG: No results for input(s): GLUCAP in the last 168 hours.  CBC: Recent Labs  Lab 09/29/17 0541 09/30/17 1309 10/01/17 0456 10/02/17 0522 10/03/17 0513  WBC 5.5 9.0 7.1 6.6 3.5*  NEUTROABS  --  7.8*  --   --   --   HGB 8.9* 9.8* 8.2* 7.8* 7.7*  HCT 28.7* 32.2* 26.4* 25.1* 24.4*  MCV 89.7 89.7 89.5 88.7 87.8  PLT 140* 141* 118* 116* 110*    Basic Metabolic Panel: Recent Labs  Lab 09/29/17 0541 09/30/17 1309 10/01/17 0456 10/02/17 0522 10/03/17 0513  NA 138 137 137 135 138  K 4.0 4.6 4.2 4.3 4.2  CL 108 107 106 107 106  CO2 24 24 23 22 23   GLUCOSE 78 88 81 75 120*  BUN 17 17 19  21* 24*  CREATININE 0.75 0.75 0.80 0.85 0.87  CALCIUM 7.5* 7.6* 7.4* 7.2* 7.3*    Recent Results (from the past 240 hour(s))  MRSA PCR Screening     Status: None   Collection Time: 10/01/17  8:49 AM  Result Value Ref Range Status   MRSA by PCR NEGATIVE NEGATIVE Final    Comment:        The GeneXpert MRSA Assay (FDA approved for NASAL specimens only), is one component of a comprehensive MRSA colonization surveillance program. It is not intended to diagnose MRSA infection nor to guide or monitor treatment for MRSA infections. Performed at [facility], [place] [address] [place], [place] [address]   Culture, expectorated sputum-assessment     Status: None   Collection Time: 10/02/17  1:15 AM  Result Value Ref Range Status   Specimen Description SPUTUM  Final   Special Requests NONE  Final   Sputum evaluation   Final    THIS SPECIMEN IS ACCEPTABLE FOR SPUTUM CULTURE Performed at [facility],  [place] [address], [place], [place] [address]    Report Status 10/02/2017 FINAL  Final  Culture, respiratory (NON-Expectorated)     Status: None (Preliminary result)   Collection Time: 10/02/17  1:15 AM  Result Value Ref Range Status   Specimen Description   Final    SPUTUM Performed at [facility] [address] [place], [place] [address]    Special Requests   Final    NONE Reflexed from [id number] Performed at [facility] [address] [place], [place] [address]    Gram Stain   Final    ABUNDANT WBC PRESENT, PREDOMINANTLY PMN RARE SQUAMOUS EPITHELIAL CELLS PRESENT FEW BUDDING YEAST SEEN    Culture   Final    CULTURE REINCUBATED FOR BETTER GROWTH Performed at [facility], [address] [place], [place] [address]    Report Status PENDING  Incomplete     Liver Function Tests: Recent Labs  Lab 10/01/17 0456 10/02/17 0522  AST 19 19  ALT 8* 8*  ALKPHOS 76 73  BILITOT 0.6 0.3  PROT 5.2* 5.2*  ALBUMIN 1.4* 1.4*   No results for input(s): LIPASE, AMYLASE in the last 168 hours. No results for input(s): AMMONIA in the last 168 hours.  Cardiac Enzymes: No results for input(s): CKTOTAL, CKMB, CKMBINDEX, TROPONINI in the last 168 hours. BNP (last 3 results) Recent Labs    09/22/17 0553 10/01/17 1328  BNP 241.4* 82.4    ProBNP (last 3 results) No results for input(s): PROBNP in the last 8760 hours.    Studies: No results found.  Scheduled Meds: . feeding supplement (ENSURE ENLIVE)  237 mL Oral BID BM  . ferrous sulfate  325 mg Oral QPC breakfast  . furosemide  20 mg Oral BID  . guaiFENesin  600 mg Oral BID  . ipratropium-albuterol  3 mL Nebulization BID  . lactobacillus  1 g Oral TID WC  . latanoprost  1 drop Both Eyes QHS  . methylPREDNISolone (SOLU-MEDROL) injection  40 mg Intravenous Q8H  . multivitamin with minerals  1 tablet Oral Daily  . pantoprazole  40 mg Oral Daily  . pramipexole  1 mg Oral TID  .  rivaroxaban  20 mg Oral QAC breakfast  . sodium chloride HYPERTONIC  4 mL Nebulization BID      Time spent: 20 min  [facility] Pager [phone]. If 7PM-7AM, please contact night-coverage at [email or web address], Office  [phone]  password [email or web address]  10/03/2017, 6:13 PM  LOS: 3 days

## 2017-10-03 NOTE — Evaluation (Signed)
Physical Therapy Evaluation Patient Details Name: Derrick Sanchez MRN: [id number] DOB: [birth year] Today's Date: 10/03/2017   History of Present Illness  84 y.o. male with medical history significant for spinal epidural abscess, lumbar radiculopathy, paraparesis of lower limbs, chronic osteomyelitis of lumbar spine, frequent falls, Malnutrition, failure to thrive who presented to ED [facility] from SNF with complaints of dyspnea and persistent productive cough.  Patient was discharged from the hospital yesterday 09/29/17 after being admitted for right upper lobe community-acquired pneumonia and vertebral osteomyelitis/discitis discharged on oral Levaquin.  He denies chest pain or palpitations.  Admitted for suspected aspiration pneumonia versus pneumonitis versus HCAP.  Clinical Impression  Pt admitted with above diagnosis. Pt currently with functional limitations due to the deficits listed below (see PT Problem List). Pt was agreeable to trying to scoot into chair as he scoots into his w/c with assist. However, once in sitting position EOB, pt was unable to tolerate upright due to back pain and had to lie back down after 1 minute. Pt also with some STM deficits noted on eval.  Pt will benefit from skilled PT to increase their independence and safety with mobility to allow discharge to the venue listed below.       Follow Up Recommendations SNF    Equipment Recommendations  None recommended by PT    Recommendations for Other Services       Precautions / Restrictions Precautions Precautions: Fall Restrictions Weight Bearing Restrictions: No      Mobility  Bed Mobility Overal bed mobility: Needs Assistance Bed Mobility: Supine to Sit;Sit to Supine     Supine to sit: Max assist Sit to supine: Max assist   General bed mobility comments: pt able to assist minimally with trunk but back pain so great as he got to upright position that he could not maintain  Transfers                  General transfer comment: unable to tolerate  Ambulation/Gait             General Gait Details: unable  Stairs            Wheelchair Mobility    Modified Rankin (Stroke Patients Only)       Balance Overall balance assessment: Needs assistance Sitting-balance support: Bilateral upper extremity supported Sitting balance-Leahy Scale: Zero Sitting balance - Comments: needed max A to maintain sitting                                     Pertinent Vitals/Pain Pain Assessment: Faces Faces Pain Scale: Hurts whole lot Pain Location: back Pain Descriptors / Indicators: Aching Pain Intervention(s): Limited activity within patient's tolerance;Monitored during session    Home Living Family/patient expects to be discharged to:: Skilled nursing facility Living Arrangements: Non-relatives/Friends Available Help at Discharge: Personal care attendant           Home Equipment: Electric scooter      Prior Function Level of Independence: Needs assistance   Gait / Transfers Assistance Needed: w/c dependent with assist for lateral scoot transfers  ADL's / Homemaking Assistance Needed: assist needed for bathing and dressing        Hand Dominance        Extremity/Trunk Assessment   Upper Extremity Assessment Upper Extremity Assessment: Generalized weakness    Lower Extremity Assessment Lower Extremity Assessment: Generalized weakness;RLE deficits/detail RLE Deficits / Details: B knee flexion  contractures at 90 degrees RLE Sensation: decreased light touch RLE Coordination: decreased fine motor LLE Deficits / Details: same as right    Cervical / Trunk Assessment Cervical / Trunk Assessment: Kyphotic  Communication   Communication: No difficulties  Cognition Arousal/Alertness: Awake/alert Behavior During Therapy: WFL for tasks assessed/performed Overall Cognitive Status: Impaired/Different from baseline Area of Impairment: Memory                      Memory: Decreased short-term memory         General Comments: could not remember that his visitor was his grandaughter, thought she was his niece      General Comments      Exercises     Assessment/Plan    PT Assessment Patient needs continued PT services  PT Problem List Decreased strength;Decreased range of motion;Decreased knowledge of use of DME;Decreased activity tolerance;Decreased safety awareness;Decreased balance;Decreased knowledge of precautions;Decreased mobility;Decreased skin integrity       PT Treatment Interventions DME instruction;Functional mobility training;Therapeutic activities;Patient/family education;Therapeutic exercise    PT Goals (Current goals can be found in the Care Plan section)  Acute Rehab PT Goals Patient Stated Goal: go back to [facility] PT Goal Formulation: With patient Time For Goal Achievement: 10/17/17 Potential to Achieve Goals: Fair    Frequency Min 2X/week   Barriers to discharge        Co-evaluation               AM-PAC PT "6 Clicks" Daily Activity  Outcome Measure Difficulty turning over in bed (including adjusting bedclothes, sheets and blankets)?: Unable Difficulty moving from lying on back to sitting on the side of the bed? : Unable Difficulty sitting down on and standing up from a chair with arms (e.g., wheelchair, bedside commode, etc,.)?: Unable Help needed moving to and from a bed to chair (including a wheelchair)?: Total Help needed walking in hospital room?: Total Help needed climbing 3-5 steps with a railing? : Total 6 Click Score: 6    End of Session   Activity Tolerance: Patient tolerated treatment well Patient left: in bed;with call bell/phone within reach Nurse Communication: Mobility status PT Visit Diagnosis: Muscle weakness (generalized) (M62.81);History of falling (Z91.81);Pain Pain - part of body: (back)    Time: 5427-0623 PT Time Calculation (min) (ACUTE ONLY): 20  min   Charges:   PT Evaluation $PT Eval Moderate Complexity: 1 Mod     PT G Codes:        Leighton Roach, PT  Acute Rehab Services  [place] 10/03/2017, 4:21 PM

## 2017-10-04 LAB — CULTURE, RESPIRATORY W GRAM STAIN: Culture: NORMAL

## 2017-10-04 LAB — BASIC METABOLIC PANEL
ANION GAP: 9 (ref 5–15)
BUN: 27 mg/dL — ABNORMAL HIGH (ref 6–20)
CHLORIDE: 105 mmol/L (ref 101–111)
CO2: 23 mmol/L (ref 22–32)
Calcium: 7.5 mg/dL — ABNORMAL LOW (ref 8.9–10.3)
Creatinine, Ser: 0.87 mg/dL (ref 0.61–1.24)
Glucose, Bld: 116 mg/dL — ABNORMAL HIGH (ref 65–99)
POTASSIUM: 4.2 mmol/L (ref 3.5–5.1)
Sodium: 137 mmol/L (ref 135–145)

## 2017-10-04 LAB — CULTURE, RESPIRATORY

## 2017-10-04 NOTE — Progress Notes (Signed)
[facility]  PROGRESS NOTE  Derrick Sanchez WNU:[id number] DOB: [birth year] DOA: 09/30/2017 PCP: [name], MD   Brief HPI:   84 y.o. male with medical history significant for spinal epidural abscess, lumbar radiculopathy, paraparesis of lower limbs, chronic osteomyelitis of lumbar spine, frequent falls, Malnutrition, failure to thrive who presented to ED [facility] from SNF with complaints of dyspnea and persistent productive cough.  Patient was discharged from the hospital yesterday 09/29/17 after being admitted for right upper lobe community-acquired pneumonia and vertebral osteomyelitis/discitis discharged on oral Levaquin.  He denies chest pain or palpitations.  Admitted for suspected aspiration pneumonia versus pneumonitis versus HCAP.   Subjective   Patient seen and examined, breathing has improved.  Also patient vitals has improved with blood pressure is 132/64   Assessment/Plan:     1. Acute hypoxic respiratory failure-improving, multifactorial from aspiration pneumonia, underlying diastolic CHF.  Patient started on vancomycin and Zosyn.  Swallow evaluation done which showed severe aspiration risk, patient is on dysphagia 1 thin liquid.  Continue duo nebs every 6 hours.  Also added Solu-Medrol 40 mg IV every 8 hours 2. Acute on chronic diastolic CHF-patient's weight has gone up 11 kg since previous admission, he does have anasarca along with bilateral infiltrates noted on the chest x-ray, strict I's and O's.  Continue Lasix 20 mg p.o. twice daily 3. Failure to thrive/malnutrition -Albumin is 1.4, nutrition consulted, started on Ensure supplements t, palliative care has been following.  Patient is limited code. 4. Anemia-chronic, hemoglobin is 7.7.  Will check CBC in a.m. and transfuse for hemoglobin less than 7.0.  Also check stool for occult blood. 5. Chronic low back pain/vertebral osteomyelitis-continue pain management, on chronic suppressive therapy with Levaquin 6. History of lower  extremity DVT-continue Xarelto    DVT prophylaxis: Xarelto  Code Status: Full code  Family Communication: Discussed with patient's son at bedside  Disposition Plan: SNF once medically stable   Consultants:  None   Procedures:  None   Antibiotics:   Anti-infectives (From admission, onward)   Start     Dose/Rate Route Frequency Ordered Stop   10/01/17 1800  vancomycin (VANCOCIN) IVPB 1000 mg/200 mL premix  Status:  Discontinued     1,000 mg 200 mL/hr over 60 Minutes Intravenous Every 36 hours 09/30/17 2223 10/01/17 1913   10/01/17 0800  levofloxacin (LEVAQUIN) tablet 500 mg  Status:  Discontinued     500 mg Oral Daily before breakfast 09/30/17 1521 09/30/17 2223   09/30/17 2300  piperacillin-tazobactam (ZOSYN) IVPB 3.375 g     3.375 g 12.5 mL/hr over 240 Minutes Intravenous Every 8 hours 09/30/17 2223     09/30/17 1315  vancomycin (VANCOCIN) IVPB 1000 mg/200 mL premix     1,000 mg 200 mL/hr over 60 Minutes Intravenous  Once 09/30/17 1314 09/30/17 2054   09/30/17 1315  piperacillin-tazobactam (ZOSYN) IVPB 3.375 g     3.375 g 100 mL/hr over 30 Minutes Intravenous  Once 09/30/17 1314 09/30/17 1821       Objective   Vitals:   10/03/17 2042 10/04/17 0500 10/04/17 0637 10/04/17 0728  BP: 118/63  132/64   Pulse: (!) 58  (!) 50   Resp: 20  18   Temp: 98.1 F (36.7 C)  (!) 97.4 F (36.3 C)   TempSrc: Oral  Oral   SpO2: 92%  93% 96%  Weight:  63.9 kg (140 lb 14 oz)    Height:        Intake/Output Summary (Last 24 hours)  at 10/04/2017 1500 Last data filed at 10/04/2017 1337 Gross per 24 hour  Intake 560 ml  Output 750 ml  Net -190 ml   Filed Weights   10/02/17 0454 10/03/17 0556 10/04/17 0500  Weight: 64.3 kg (141 lb 12.1 oz) 64.1 kg (141 lb 5 oz) 63.9 kg (140 lb 14 oz)     Physical Examination:  Physical Exam:  Mouth: Oral mucosa is moist, no lesions on palate,  Neck: Supple, no deformities, masses, or tenderness Lungs: Normal respiratory effort,  bilateral clear to auscultation, no crackles or wheezes.  Heart: Regular rate and rhythm, S1 and S2 normal, no murmurs, rubs auscultated Abdomen: BS normoactive,soft,nondistended,non-tender to palpation,no organomegaly Extremities: No pretibial edema, no erythema, no cyanosis, no clubbing Neuro : Alert and oriented to time, place and person, No focal deficits     Data Reviewed: I have personally reviewed following labs and imaging studies  CBG: No results for input(s): GLUCAP in the last 168 hours.  CBC: Recent Labs  Lab 09/29/17 0541 09/30/17 1309 10/01/17 0456 10/02/17 0522 10/03/17 0513  WBC 5.5 9.0 7.1 6.6 3.5*  NEUTROABS  --  7.8*  --   --   --   HGB 8.9* 9.8* 8.2* 7.8* 7.7*  HCT 28.7* 32.2* 26.4* 25.1* 24.4*  MCV 89.7 89.7 89.5 88.7 87.8  PLT 140* 141* 118* 116* 110*    Basic Metabolic Panel: Recent Labs  Lab 09/30/17 1309 10/01/17 0456 10/02/17 0522 10/03/17 0513 10/04/17 0523  NA 137 137 135 138 137  K 4.6 4.2 4.3 4.2 4.2  CL 107 106 107 106 105  CO2 24 23 22 23 23   GLUCOSE 88 81 75 120* 116*  BUN 17 19 21* 24* 27*  CREATININE 0.75 0.80 0.85 0.87 0.87  CALCIUM 7.6* 7.4* 7.2* 7.3* 7.5*    Recent Results (from the past 240 hour(s))  MRSA PCR Screening     Status: None   Collection Time: 10/01/17  8:49 AM  Result Value Ref Range Status   MRSA by PCR NEGATIVE NEGATIVE Final    Comment:        The GeneXpert MRSA Assay (FDA approved for NASAL specimens only), is one component of a comprehensive MRSA colonization surveillance program. It is not intended to diagnose MRSA infection nor to guide or monitor treatment for MRSA infections. Performed at [facility], [place] [address], [place], [place] [address]   Culture, expectorated sputum-assessment     Status: None   Collection Time: 10/02/17  1:15 AM  Result Value Ref Range Status   Specimen Description SPUTUM  Final   Special Requests NONE  Final   Sputum evaluation   Final     THIS SPECIMEN IS ACCEPTABLE FOR SPUTUM CULTURE Performed at [facility], [place] [address] [place], [place] [address]    Report Status 10/02/2017 FINAL  Final  Culture, respiratory (NON-Expectorated)     Status: None   Collection Time: 10/02/17  1:15 AM  Result Value Ref Range Status   Specimen Description   Final    SPUTUM Performed at [facility] [address], [place], [place] [address]    Special Requests   Final    NONE Reflexed from [id number] Performed at [facility] [address] [place], [place] [address]    Gram Stain   Final    ABUNDANT WBC PRESENT, PREDOMINANTLY PMN RARE SQUAMOUS EPITHELIAL CELLS PRESENT FEW BUDDING YEAST SEEN    Culture   Final  FEW Consistent with normal respiratory flora. Performed at [facility], [place] [address], [place], [place] [address]    Report Status 10/04/2017 FINAL  Final     Liver Function Tests: Recent Labs  Lab 10/01/17 0456 10/02/17 0522  AST 19 19  ALT 8* 8*  ALKPHOS 76 73  BILITOT 0.6 0.3  PROT 5.2* 5.2*  ALBUMIN 1.4* 1.4*   No results for input(s): LIPASE, AMYLASE in the last 168 hours. No results for input(s): AMMONIA in the last 168 hours.  Cardiac Enzymes: No results for input(s): CKTOTAL, CKMB, CKMBINDEX, TROPONINI in the last 168 hours. BNP (last 3 results) Recent Labs    09/22/17 0553 10/01/17 1328  BNP 241.4* 82.4    ProBNP (last 3 results) No results for input(s): PROBNP in the last 8760 hours.    Studies: No results found.  Scheduled Meds: . feeding supplement (ENSURE ENLIVE)  237 mL Oral BID BM  . ferrous sulfate  325 mg Oral QPC breakfast  . furosemide  20 mg Oral BID  . guaiFENesin  600 mg Oral BID  . ipratropium-albuterol  3 mL Nebulization BID  . lactobacillus  1 g Oral TID WC  . latanoprost  1 drop Both Eyes QHS  . methylPREDNISolone (SOLU-MEDROL) injection  40 mg Intravenous Q8H  . multivitamin with minerals  1 tablet  Oral Daily  . pantoprazole  40 mg Oral Daily  . pramipexole  1 mg Oral TID  . rivaroxaban  20 mg Oral QAC breakfast  . sodium chloride HYPERTONIC  4 mL Nebulization BID      Time spent: 20 min  [facility] Pager [phone]. If 7PM-7AM, please contact night-coverage at [email or web address], Office  [phone]  password [email or web address]  10/04/2017, 3:00 PM  LOS: 4 days

## 2017-10-05 LAB — CBC
HEMATOCRIT: 25 % — AB (ref 39.0–52.0)
Hemoglobin: 7.9 g/dL — ABNORMAL LOW (ref 13.0–17.0)
MCH: 27.7 pg (ref 26.0–34.0)
MCHC: 31.6 g/dL (ref 30.0–36.0)
MCV: 87.7 fL (ref 78.0–100.0)
PLATELETS: 135 10*3/uL — AB (ref 150–400)
RBC: 2.85 MIL/uL — AB (ref 4.22–5.81)
RDW: 17.6 % — ABNORMAL HIGH (ref 11.5–15.5)
WBC: 3.4 10*3/uL — AB (ref 4.0–10.5)

## 2017-10-05 LAB — BASIC METABOLIC PANEL
ANION GAP: 11 (ref 5–15)
BUN: 29 mg/dL — ABNORMAL HIGH (ref 6–20)
CO2: 22 mmol/L (ref 22–32)
Calcium: 7.4 mg/dL — ABNORMAL LOW (ref 8.9–10.3)
Chloride: 103 mmol/L (ref 101–111)
Creatinine, Ser: 0.77 mg/dL (ref 0.61–1.24)
GLUCOSE: 106 mg/dL — AB (ref 65–99)
POTASSIUM: 3.9 mmol/L (ref 3.5–5.1)
Sodium: 136 mmol/L (ref 135–145)

## 2017-10-05 MED ORDER — FUROSEMIDE 20 MG PO TABS: 20.0000 mg | ORAL_TABLET | Freq: Two times a day (BID) | ORAL | Status: AC

## 2017-10-05 MED ORDER — IPRATROPIUM-ALBUTEROL 0.5-2.5 (3) MG/3ML IN SOLN: 3.0000 mL | Freq: Four times a day (QID) | RESPIRATORY_TRACT | 0 refills | Status: DC | PRN

## 2017-10-05 MED ORDER — HYDROCODONE-ACETAMINOPHEN 5-325 MG PO TABS: 1.0000 | ORAL_TABLET | Freq: Four times a day (QID) | ORAL | 0 refills | Status: DC | PRN

## 2017-10-05 MED ORDER — PREDNISONE 10 MG PO TABS: ORAL_TABLET | ORAL | 0 refills | Status: DC

## 2017-10-05 NOTE — Progress Notes (Signed)
Patient discharge canceled. [facility] will not accept patient at this time due to patient decline in condition and lack of participation with physical therapy.   CSW spoke with patient spouse and he is agreeable to talk with physician to determine if SNF rehab will be beneficial or LTC.   [employer] insurance will review patient clinical information after the physician talk with the patient son about patient condition.   Social Work will continue to assist with the patient d/c.    Kathrin Greathouse, Latanya Presser, MSW Clinical Social Worker  [phone] 10/05/2017  4:33 PM

## 2017-10-05 NOTE — NC FL2 (Addendum)
[place] LEVEL OF CARE SCREENING TOOL     IDENTIFICATION  Patient Name: Derrick Sanchez Birthdate: [birth year] Sex: male Admission Date (Current Location): 09/30/2017  [facility] and [place] Number:  Herbalist and Address:  [facility],  [place] [address] [place]      Provider Number: [id number]  Attending Physician Name and Address:  Oswald Hillock, MD  Relative Name and Phone Number:       Current Level of Care: Hospital Recommended Level of Care: [place] Prior Approval Number:    Date Approved/Denied: 10/05/17 PASRR Number: [id number]  Discharge Plan: SNF    Current Diagnoses: Patient Active Problem List   Diagnosis Date Noted  . Dysphagia   . HCAP (healthcare-associated pneumonia) 09/30/2017  . Hypoxia   . At high risk for aspiration   . Pressure injury of skin 09/22/2017  . Multiple closed fractures of ribs of right side   . Pleural effusion   . Palliative care by specialist   . CAP (community acquired pneumonia) 09/20/2017  . Acute on chronic respiratory failure with hypoxia ([place]) 09/20/2017  . Malnutrition ([place]) 09/20/2017  . FTT (failure to thrive) in adult 09/20/2017  . PVD (peripheral vascular disease) ([place]) 08/04/2016  . Goals of care, counseling/discussion 08/04/2016  . Urinary hesitancy 11/26/2015  . Essential hypertension 01/05/2015  . Lumbar radiculopathy 11/23/2014  . Spinal epidural abscess 03/12/2014  . Protein-calorie malnutrition, severe ([place]) 03/01/2014  . Frequent falls 02/27/2014  . Trochanteric fracture ([place]) 02/27/2014  . Lumbar stenosis with neurogenic claudication 02/06/2014  . Stiffness of joints, not elsewhere classified, multiple sites 12/22/2012  . Postural imbalance 12/22/2012  . Hx of falling 12/22/2012  . HYPERPARATHYROIDISM UNSPECIFIED 05/03/2010  . OBSTRUCTIVE SLEEP APNEA 05/03/2010  . GLAUCOMA 05/03/2010  . ASTHMA 05/03/2010  . ANEMIA, VITAMIN B12 DEFICIENCY  06/14/2007  . RESTLESS LEG SYNDROME 06/14/2007  . GASTROESOPHAGEAL REFLUX DISEASE 06/14/2007  . DIVERTICULOSIS, COLON 05/31/2007  . POLYP, COLON 05/27/2002    Orientation RESPIRATION BLADDER Height & Weight     Self, Place   2 Liters  External catheter Weight: 140 lb 10.5 oz (63.8 kg) Height:  6\' 2"  (188 cm)  BEHAVIORAL SYMPTOMS/MOOD NEUROLOGICAL BOWEL NUTRITION STATUS      Continent Diet(Low Sodium Heart Healthy)  AMBULATORY STATUS COMMUNICATION OF NEEDS Skin   Extensive Assist Verbally PU Stage and Appropriate Care  Pressure Injury Left Leg Wound, Hip, Back                      Personal Care Assistance Level of Assistance  Bathing, Feeding, Dressing Bathing Assistance: Maximum assistance Feeding assistance: Independent Dressing Assistance: Maximum assistance     Functional Limitations Info  Sight, Hearing, Speech Sight Info: Impaired Hearing Info: Impaired Speech Info: Adequate    SPECIAL CARE FACTORS FREQUENCY  PT (By licensed PT), OT (By licensed OT)     PT Frequency: 2X/WEEK OT Frequency: 2X/WEEK            Contractures Contractures Info: Not present    Additional Factors Info  Code Status, Allergies Code Status Info: FULLCODE Allergies Info: nka           Current Medications (10/05/2017):  This is the current hospital active medication list Current Facility-Administered Medications  Medication Dose Route Frequency Provider Last Rate Last Dose  . acetaminophen (TYLENOL) tablet 650 mg  650 mg Oral Q6H PRN Irene Pap N, DO      . feeding supplement (  ENSURE ENLIVE) (ENSURE ENLIVE) liquid 237 mL  237 mL Oral BID BM Oswald Hillock, MD   237 mL at 10/05/17 0900  . ferrous sulfate tablet 325 mg  325 mg Oral QPC breakfast Kayleen Memos, DO   325 mg at 10/05/17 4967  . furosemide (LASIX) tablet 20 mg  20 mg Oral BID Oswald Hillock, MD   20 mg at 10/05/17 0859  . guaiFENesin (MUCINEX) 12 hr tablet 600 mg  600 mg Oral BID Irene Pap N, DO   600 mg at  10/05/17 0859  . HYDROcodone-acetaminophen (NORCO/VICODIN) 5-325 MG per tablet 1 tablet  1 tablet Oral Q6H PRN Irene Pap N, DO   1 tablet at 10/05/17 0309  . ipratropium-albuterol (DUONEB) 0.5-2.5 (3) MG/3ML nebulizer solution 3 mL  3 mL Nebulization Q2H PRN Hall, Carole N, DO      . ipratropium-albuterol (DUONEB) 0.5-2.5 (3) MG/3ML nebulizer solution 3 mL  3 mL Nebulization BID Oswald Hillock, MD   3 mL at 10/05/17 0732  . lactobacillus (FLORANEX/LACTINEX) granules 1 g  1 g Oral TID WC Hall, Carole N, DO   1 g at 10/05/17 0859  . latanoprost (XALATAN) 0.005 % ophthalmic solution 1 drop  1 drop Both Eyes QHS Hall, Carole N, DO   1 drop at 10/04/17 2125  . methylPREDNISolone sodium succinate (SOLU-MEDROL) 40 mg/mL injection 40 mg  40 mg Intravenous Q8H Oswald Hillock, MD   40 mg at 10/05/17 5916  . multivitamin with minerals tablet 1 tablet  1 tablet Oral Daily Oswald Hillock, MD   1 tablet at 10/05/17 0859  . pantoprazole (PROTONIX) EC tablet 40 mg  40 mg Oral Daily Irene Pap N, DO   40 mg at 10/05/17 0859  . piperacillin-tazobactam (ZOSYN) IVPB 3.375 g  3.375 g Intravenous Q8H Wofford, Drew A, RPH 12.5 mL/hr at 10/05/17 0614 3.375 g at 10/05/17 3846  . pramipexole (MIRAPEX) tablet 1 mg  1 mg Oral TID Irene Pap N, DO   1 mg at 10/05/17 0859  . rivaroxaban (XARELTO) tablet 20 mg  20 mg Oral QAC breakfast Irene Pap N, DO   20 mg at 10/05/17 0859  . sodium chloride HYPERTONIC 3 % nebulizer solution 4 mL  4 mL Nebulization BID Oswald Hillock, MD   4 mL at 10/05/17 0732     Discharge Medications: Please see discharge summary for a list of discharge medications.  Relevant Imaging Results:  Relevant Lab Results:   Additional Information ssn: [id number]  Lia Hopping, LCSW
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
## 2017-10-06 DIAGNOSIS — R319 Hematuria, unspecified: Secondary | ICD-10-CM

## 2017-10-06 LAB — URINALYSIS, ROUTINE W REFLEX MICROSCOPIC
Bilirubin Urine: NEGATIVE
Glucose, UA: NEGATIVE mg/dL
Ketones, ur: NEGATIVE mg/dL
Leukocytes, UA: NEGATIVE
Nitrite: NEGATIVE
PROTEIN: NEGATIVE mg/dL
RBC / HPF: >50 RBC/hpf — ABNORMAL HIGH (ref 0–5)
Specific Gravity, Urine: 1.014 (ref 1.005–1.030)
pH: 7 (ref 5.0–8.0)

## 2017-10-06 NOTE — Progress Notes (Signed)
CSW contacted by [employer] staff and informed that Medical Director reported that patient's care was custodial. Patient did not receive insurance authorization. Staff reported that patient's attending MD can do a peer to peer if interested.  CSW updated patient's attending MD, MD declined peer to peer.   CSW contacted by [facility] SN[facility] admissions staff Claiborne Billings and informed that they can not accept patient because of patient's suction needs.   CSW followed up with [facility] to see if they can accept patient for Vernecia Umble term care with suction needs, staff member Irine Seal reported that they are able to accept patient with current suction needs.  CSW updated patient's son Tyra Michelle. Patient's son reported that he would reach out to [facility] to confirm that they are not able to accept patient with suction needs.   Patient to discharge to SNF for [facility] Colee term care. Selected SNF unspecified at this time.  CSW will continue to follow and assist with discharge planning. Patient to discharge when medically stable.  Abundio Miu, [place] Social Worker [facility] Cell#: [phone]

## 2017-10-06 NOTE — Care Management Important Message (Signed)
Important Message  Patient Details  Name: KEANON BEVINS MRN: [id number] Date of Birth: 1930-08-24   Medicare Important Message Given:  Yes    Kerin Salen 10/06/2017, 1:20 PMImportant Message  Patient Details  Name: CEEJAY KEGLEY MRN: [id number] Date of Birth: 1930-09-24   Medicare Important Message Given:  Yes    Kerin Salen 10/06/2017, 1:20 PM

## 2017-10-06 NOTE — Progress Notes (Signed)
CSW spoke with patient/patient's son at bedside regarding discharge plans.  Patient's son reported that he is interested in [place] rehab to see if patient can regain some of his functional abilities. CSW provided patient's son with bed offers. Patient's son reported that [facility] SNF is their first choice and [facility] SN[facility] is their second choice. Patient's son inquired about [facility], CSW informed patient's son that [facility] did not make an offer. CSW agreed to follow up with [facility] and patient's insurance ([employer]). Patient's son reported that the back up plan is for patient to discharge back to [facility] for Derrick Sanchez term care with hospice following. CSW contacted [facility], admissions staff confirmed that patient can return to [facility] with private pay.   CSW contacted [facility] and spoke with admissions staff regarding patient's interest in SNF. Staff agreed to review patient's referral.  CSW contacted [employer] and spoke with staff member Derrick Sanchez). CSW provided update about son's interest in [facility] rehab for patient. Staff reported that patient's insurance authorization will go to [employer] for review.   CSW provided update to patient's son. CSW will continue to follow and assist with discharge planning.  Abundio Miu, [place] Social Worker [facility] Cell#: [phone]

## 2017-10-06 NOTE — Progress Notes (Addendum)
[facility]  PROGRESS NOTE  Derrick Sanchez PJK:[id number] DOB: 1930/12/21 DOA: 09/30/2017 PCP: [name], MD   Brief HPI:   84 y.o. male with medical history significant for spinal epidural abscess, lumbar radiculopathy, paraparesis of lower limbs, chronic osteomyelitis of lumbar spine, frequent falls, Malnutrition, failure to thrive who presented to [facility] from SNF with complaints of dyspnea and persistent productive cough.  Patient was discharged from the hospital yesterday 09/29/17 after being admitted for right upper lobe community-acquired pneumonia and vertebral osteomyelitis/discitis discharged on oral Levaquin.  He denies chest pain or palpitations.  Admitted for suspected aspiration pneumonia versus pneumonitis versus HCAP.   Subjective   Patient seen and examined, hematuria last night.  Denies abdominal pain.  Patient was discharged yesterday but discharge was held as skilled facility had refused to accept the patient.     Assessment/Plan:     1. Acute hypoxic respiratory failure-improving, multifactorial from aspiration pneumonia, underlying diastolic CHF.  Patient started on vancomycin and Zosyn.  Swallow evaluation done which showed severe aspiration risk, patient is on dysphagia 1 thin liquid.  Continue duo nebs every 6 hours.  Also added Solu-Medrol 40 mg IV every 8 hours, will change Solu-Medrol to 40 mg IV every 12 hours. 2. Acute on chronic diastolic CHF-patient's weight has gone up 11 kg since previous admission, he does have anasarca along with bilateral infiltrates noted on the chest x-ray, strict I's and O's.  Continue Lasix 20 mg p.o. twice daily 3. Failure to thrive/malnutrition -Albumin is 1.4, nutrition consulted, started on Ensure supplements t, palliative care has been following.  Patient is limited code. 4. Anemia-chronic, hemoglobin is 7.9.  Will check CBC in a.m. and transfuse for hemoglobin less than 7.0.   5. Hematuria - patient developed hematuria last  night,  check a UA as patient was more than 50 RBCs per high-power field.  Called and discussed with Dr Quenten Raven, As patient is urinating without difficulty he recommends seeing as outpatient.  6. Chronic low back pain/vertebral osteomyelitis-continue pain management, on chronic suppressive therapy with Levaquin 7. History of lower extremity DVT-Hold   Xarelto for hematuria as above. Can be restarted if hematuria resolves.     DVT prophylaxis: Xarelto  Code Status: Full code  Family Communication: Discussed with patient's son at bedside  Disposition Plan: SNF once medically stable   Consultants:  None   Procedures:  None   Antibiotics:   Anti-infectives (From admission, onward)   Start     Dose/Rate Route Frequency Ordered Stop   10/01/17 1800  vancomycin (VANCOCIN) IVPB 1000 mg/200 mL premix  Status:  Discontinued     1,000 mg 200 mL/hr over 60 Minutes Intravenous Every 36 hours 09/30/17 2223 10/01/17 1913   10/01/17 0800  levofloxacin (LEVAQUIN) tablet 500 mg  Status:  Discontinued     500 mg Oral Daily before breakfast 09/30/17 1521 09/30/17 2223   09/30/17 2300  piperacillin-tazobactam (ZOSYN) IVPB 3.375 g     3.375 g 12.5 mL/hr over 240 Minutes Intravenous Every 8 hours 09/30/17 2223     09/30/17 1315  vancomycin (VANCOCIN) IVPB 1000 mg/200 mL premix     1,000 mg 200 mL/hr over 60 Minutes Intravenous  Once 09/30/17 1314 09/30/17 2054   09/30/17 1315  piperacillin-tazobactam (ZOSYN) IVPB 3.375 g     3.375 g 100 mL/hr over 30 Minutes Intravenous  Once 09/30/17 1314 09/30/17 1821       Objective   Vitals:   10/06/17 8099 10/06/17 0513 10/06/17 8338  10/06/17 1427  BP: 128/67   123/64  Pulse: (!) 49   (!) 55  Resp: 20   17  Temp: 97.7 F (36.5 C)   97.9 F (36.6 C)  TempSrc: Oral   Oral  SpO2: 96%  95% 100%  Weight:  63.6 kg (140 lb 3.4 oz)    Height:        Intake/Output Summary (Last 24 hours) at 10/06/2017 1442 Last data filed at 10/06/2017 0600 Gross per  24 hour  Intake 390 ml  Output 1675 ml  Net -1285 ml   Filed Weights   10/04/17 0500 10/05/17 0526 10/06/17 0513  Weight: 63.9 kg (140 lb 14 oz) 63.8 kg (140 lb 10.5 oz) 63.6 kg (140 lb 3.4 oz)     Physical Examination:   Mouth: Oral mucosa is moist, no lesions on palate,  Neck: Supple, no deformities, masses, or tenderness Lungs: Normal respiratory effort, bilateral clear to auscultation, no crackles or wheezes.  Heart: Regular rate and rhythm, S1 and S2 normal, no murmurs, rubs auscultated Abdomen: BS normoactive,soft,nondistended,non-tender to palpation,no organomegaly Extremities: No pretibial edema, no erythema, no cyanosis, no clubbing Neuro : Alert and oriented to time, place and person, No focal deficits      Data Reviewed: I have personally reviewed following labs and imaging studies  CBG: No results for input(s): GLUCAP in the last 168 hours.  CBC: Recent Labs  Lab 09/30/17 1309 10/01/17 0456 10/02/17 0522 10/03/17 0513 10/05/17 0554  WBC 9.0 7.1 6.6 3.5* 3.4*  NEUTROABS 7.8*  --   --   --   --   HGB 9.8* 8.2* 7.8* 7.7* 7.9*  HCT 32.2* 26.4* 25.1* 24.4* 25.0*  MCV 89.7 89.5 88.7 87.8 87.7  PLT 141* 118* 116* 110* 135*    Basic Metabolic Panel: Recent Labs  Lab 10/01/17 0456 10/02/17 0522 10/03/17 0513 10/04/17 0523 10/05/17 0554  NA 137 135 138 137 136  K 4.2 4.3 4.2 4.2 3.9  CL 106 107 106 105 103  CO2 23 22 23 23 22   GLUCOSE 81 75 120* 116* 106*  BUN 19 21* 24* 27* 29*  CREATININE 0.80 0.85 0.87 0.87 0.77  CALCIUM 7.4* 7.2* 7.3* 7.5* 7.4*    Recent Results (from the past 240 hour(s))  MRSA PCR Screening     Status: None   Collection Time: 10/01/17  8:49 AM  Result Value Ref Range Status   MRSA by PCR NEGATIVE NEGATIVE Final    Comment:        The GeneXpert MRSA Assay (FDA approved for NASAL specimens only), is one component of a comprehensive MRSA colonization surveillance program. It is not intended to diagnose MRSA infection  nor to guide or monitor treatment for MRSA infections. Performed at [facility], [place] [address] [place], [place] [address]   Culture, expectorated sputum-assessment     Status: None   Collection Time: 10/02/17  1:15 AM  Result Value Ref Range Status   Specimen Description SPUTUM  Final   Special Requests NONE  Final   Sputum evaluation   Final    THIS SPECIMEN IS ACCEPTABLE FOR SPUTUM CULTURE Performed at [facility], [place] [address] [place],  [address]    Report Status 10/02/2017 FINAL  Final  Culture, respiratory (NON-Expectorated)     Status: None   Collection Time: 10/02/17  1:15 AM  Result Value Ref Range Status   Specimen Description   Final    SPUTUM Performed at [facility]  [facility], [place] [address] [place], [place] [address]    Special Requests   Final    NONE Reflexed from [id number] Performed at [place] [address] [place], [place] [address]    Gram Stain   Final    ABUNDANT WBC PRESENT, PREDOMINANTLY PMN RARE SQUAMOUS EPITHELIAL CELLS PRESENT FEW BUDDING YEAST SEEN    Culture   Final    FEW Consistent with normal respiratory flora. Performed at [facility], [place] [address] [place],  [address]    Report Status 10/04/2017 FINAL  Final     Liver Function Tests: Recent Labs  Lab 10/01/17 0456 10/02/17 0522  AST 19 19  ALT 8* 8*  ALKPHOS 76 73  BILITOT 0.6 0.3  PROT 5.2* 5.2*  ALBUMIN 1.4* 1.4*   No results for input(s): LIPASE, AMYLASE in the last 168 hours. No results for input(s): AMMONIA in the last 168 hours.  Cardiac Enzymes: No results for input(s): CKTOTAL, CKMB, CKMBINDEX, TROPONINI in the last 168 hours. BNP (last 3 results) Recent Labs    09/22/17 0553 10/01/17 1328  BNP 241.4* 82.4    ProBNP (last 3 results) No results for input(s): PROBNP in the last 8760 hours.    Studies: No results found.  Scheduled Meds: . feeding supplement  (ENSURE ENLIVE)  237 mL Oral BID BM  . ferrous sulfate  325 mg Oral QPC breakfast  . furosemide  20 mg Oral BID  . guaiFENesin  600 mg Oral BID  . ipratropium-albuterol  3 mL Nebulization BID  . lactobacillus  1 g Oral TID WC  . latanoprost  1 drop Both Eyes QHS  . methylPREDNISolone (SOLU-MEDROL) injection  40 mg Intravenous Q8H  . multivitamin with minerals  1 tablet Oral Daily  . pantoprazole  40 mg Oral Daily  . pramipexole  1 mg Oral TID  . rivaroxaban  20 mg Oral QAC breakfast  . sodium chloride HYPERTONIC  4 mL Nebulization BID      Time spent: 20 min  [facility] Pager [phone]. If 7PM-7AM, please contact night-coverage at [email or web address], Office  [phone]  password [email or web address]  10/06/2017, 2:42 PM  LOS: 6 days

## 2017-10-06 NOTE — Progress Notes (Signed)
Daily Progress Note   Patient Name: Derrick Sanchez       Date: 10/06/2017 DOB: May 15, 1931  Age: 84 y.o. MRN#: [id number] Attending Physician: Oswald Hillock, MD Primary Care Physician: [name], MD Admit Date: 09/30/2017  Reason for Consultation/Follow-up: Establishing goals of care  Subjective:  Patient awake, alert, oriented. Denies pain or discomfort.   GOC:  Follow-up with son, [name] at bedside. Answered questions and concerns for him regarding diagnoses, interventions, and underlying co-morbidities.  Discussed in detail disposition plan with [name] and social work at bedside. Discussed palliative versus hospice options. Discussed hospice philosophy with goal of comfort, quality, dignity. Also symptom management and preventing re-hospitalization for recurrent aspiration. At this point, so would like to attempt occupational therapy to see if his dad can feed himself again. Patient states "I think I can do it" in regards to having the strength to feed self again. [name] is interested in hospice services at home in the future, if he can find 24/7 caregivers to come to the house.  [name] understands nursing facilities do not have 'limited codes' and asks for his father to be a "DNR" when transferred to nursing facility. I attempted to complete MOST form with [name] but he wishes to hold off for now. He wishes for his father to be a limited code when at the hospital.   Answered many questions and concerns. [name] has PMT contact information and waiting for SW to get back with him today regarding discharge plan.   Length of Stay: 6  Current Medications: Scheduled Meds:  . feeding supplement (ENSURE ENLIVE)  237 mL Oral BID BM  . ferrous sulfate  325 mg Oral QPC breakfast  . furosemide  20 mg Oral BID    . guaiFENesin  600 mg Oral BID  . ipratropium-albuterol  3 mL Nebulization BID  . lactobacillus  1 g Oral TID WC  . latanoprost  1 drop Both Eyes QHS  . methylPREDNISolone (SOLU-MEDROL) injection  40 mg Intravenous Q8H  . multivitamin with minerals  1 tablet Oral Daily  . pantoprazole  40 mg Oral Daily  . pramipexole  1 mg Oral TID  . rivaroxaban  20 mg Oral QAC breakfast  . sodium chloride HYPERTONIC  4 mL Nebulization BID    Continuous Infusions: . piperacillin-tazobactam (ZOSYN)  IV 3.375  g (10/06/17 0509)    PRN Meds: acetaminophen, HYDROcodone-acetaminophen, ipratropium-albuterol  Physical Exam  Constitutional: He is cooperative.  HENT:  Head: Normocephalic and atraumatic.  Pulmonary/Chest: No accessory muscle usage. No tachypnea. No respiratory distress.  Abdominal: Normal appearance.  Neurological: He is alert.  Oriented to person/place  Skin: Skin is warm and dry.  Psychiatric: He has a normal mood and affect. His speech is normal and behavior is normal.  Nursing note and vitals reviewed.          Vital Signs: BP 128/67 (BP Location: Right Arm)   Pulse (!) 49   Temp 97.7 F (36.5 C) (Oral)   Resp 20   Ht 6\' 2"  (1.88 m)   Wt 63.6 kg (140 lb 3.4 oz)   SpO2 95%   BMI 18.00 kg/m  SpO2: SpO2: 95 % O2 Device: O2 Device: Nasal Cannula O2 Flow Rate: O2 Flow Rate (L/min): 2 L/min  Intake/output summary:   Intake/Output Summary (Last 24 hours) at 10/06/2017 0945 Last data filed at 10/06/2017 0600 Gross per 24 hour  Intake 630 ml  Output 1675 ml  Net -1045 ml   LBM: Last BM Date: 10/06/17 Baseline Weight: Weight: 63.3 kg (139 lb 8.8 oz) Most recent weight: Weight: 63.6 kg (140 lb 3.4 oz)       Palliative Assessment/Data: PPS 30%   Flowsheet Rows     Most Recent Value  Intake Tab  Referral Department  Hospitalist  Unit at Time of Referral  Med/Surg Unit  Palliative Care Type  Return patient Palliative Care  Reason for referral  Clarify Goals of Care   Date first seen by Palliative Care  10/01/17  Clinical Assessment  Palliative Performance Scale Score  30%  Psychosocial & Spiritual Assessment  Palliative Care Outcomes  Patient/Family meeting held?  Yes  Who was at the meeting?  patient and son  Palliative Care Outcomes  Clarified goals of care, ACP counseling assistance, Provided psychosocial or spiritual support, Provided end of life care assistance      Patient Active Problem List   Diagnosis Date Noted  . Dysphagia   . HCAP (healthcare-associated pneumonia) 09/30/2017  . Hypoxia   . At high risk for aspiration   . Pressure injury of skin 09/22/2017  . Multiple closed fractures of ribs of right side   . Pleural effusion   . Palliative care by specialist   . CAP (community acquired pneumonia) 09/20/2017  . Acute on chronic respiratory failure with hypoxia ([place]) 09/20/2017  . Malnutrition ([place]) 09/20/2017  . FTT (failure to thrive) in adult 09/20/2017  . PVD (peripheral vascular disease) ([place]) 08/04/2016  . Goals of care, counseling/discussion 08/04/2016  . Urinary hesitancy 11/26/2015  . Essential hypertension 01/05/2015  . Lumbar radiculopathy 11/23/2014  . Spinal epidural abscess 03/12/2014  . Protein-calorie malnutrition, severe ([place]) 03/01/2014  . Frequent falls 02/27/2014  . Trochanteric fracture ([place]) 02/27/2014  . Lumbar stenosis with neurogenic claudication 02/06/2014  . Stiffness of joints, not elsewhere classified, multiple sites 12/22/2012  . Postural imbalance 12/22/2012  . Hx of falling 12/22/2012  . HYPERPARATHYROIDISM UNSPECIFIED 05/03/2010  . OBSTRUCTIVE SLEEP APNEA 05/03/2010  . GLAUCOMA 05/03/2010  . ASTHMA 05/03/2010  . ANEMIA, VITAMIN B12 DEFICIENCY 06/14/2007  . RESTLESS LEG SYNDROME 06/14/2007  . GASTROESOPHAGEAL REFLUX DISEASE 06/14/2007  . DIVERTICULOSIS, COLON 05/31/2007  . POLYP, COLON 05/27/2002    Palliative Care Assessment & Plan   Patient Profile: 84 y.o. male  with past medical  history of back surgery s/p complications  with discitis and osteomyelitis requiring long-term antibiotics, dysphagia, falls, hiatal hernia, HTN, DVT, esophageal stricture, and asthmaadmitted on 4/7/2019with right chest pain, cough and congestion.About two weeks ago, the patient suffered from a fall when transferring from motorized scooter to bed. Visited with neurosurgeon who imaged his back. Per son, no mention of rib fractures at that time. Continued to have right-sided chest pain despite oxycodone. In ED, chest xray revealed multiple right rib fractures, right upper lobe pneumonia, and large left pleural effusion with midlung airspace opacities which could represent pneumonia. CT chest revealed moderate bilateral pleural effusions.  Patient re-admitted on 09/30/17 from SNF with shortness of breath and cough. Chest xray in ED revealed bilateral infiltrates more prominent in RML and increased pulmonary vascularity. Hypoxic with oxygen saturations between 88-90% on 3L. Started on IV zosyn and vancomycin. Also with chronic diastolic heart failure with EF 60-65%. ECHO on 09/22/16 revealed Grade 1 diastolic dysfunction. Palliative medicine consultation for goals of care.   Assessment: Acute hypoxic respiratory failure Acute on chronic diastolic CHF Failure to thrive/malnutrition Anemia Chronic vertebral osteomyelitis Chronic low back pain Hx of lower extremity DVT  Recommendations/Plan:  Limited code while in hospital.   Son requests "DNR" when transferred to SNF. He does not want to complete MOST form with me today.   Discussed palliative versus hospice options on discharge. Educated on hospice philosophy.   SW working on Teaching laboratory technician. SNF for rehab versus back to [facility] for [place] with hospice.    Code Status: Limited   Code Status Orders  (From admission, onward)        Start     Ordered   10/01/17 1446  Limited resuscitation (code)  Continuous    Question Answer Comment  In  the event of cardiac or respiratory ARREST: Initiate Code Blue, Call Rapid Response Yes   In the event of cardiac or respiratory ARREST: Perform CPR No   In the event of cardiac or respiratory ARREST: Perform Intubation/Mechanical Ventilation No   In the event of cardiac or respiratory ARREST: Use NIPPV/BiPAp only if indicated Yes   In the event of cardiac or respiratory ARREST: Administer ACLS medications if indicated Yes   In the event of cardiac or respiratory ARREST: Perform Defibrillation or Cardioversion if indicated Yes      10/01/17 1450    Code Status History    Date Active Date Inactive Code Status Order ID Comments User Context   10/01/2017 0845 10/01/2017 1450 Full Code [id number]  Oswald Hillock, MD Inpatient   10/01/2017 0507 10/01/2017 0845 DNR [id number]  Jani Gravel, MD Inpatient   09/30/2017 1508 10/01/2017 0507 Full Code [id number]  Kayleen Memos, DO ED   09/30/2017 1449 09/30/2017 1508 DNR [id number]  Kayleen Memos, DO ED   09/30/2017 1436 09/30/2017 1449 Full Code [id number]  Kayleen Memos, DO ED   09/29/2017 1418 09/29/2017 1848 DNR [id number]  Roxan Hockey, MD Inpatient   09/20/2017 1703 09/29/2017 1418 Full Code [id number]  Erline Hau, MD ED   11/23/2014 1053 11/24/2014 1257 Full Code [id number]  Jovita Gamma, MD Inpatient   10/16/2014 1014 10/17/2014 0330 Full Code [id number]  Marybelle Killings, MD [facility]   03/12/2014 2043 03/16/2014 1751 Full Code [id number]  Elaina Hoops, MD Inpatient   02/27/2014 1235 03/03/2014 2024 Full Code [id number]  Cristal Ford, DO Inpatient   02/06/2014 1931 02/08/2014 1419 Full Code [id number]  Hosie Spangle, MD Inpatient    Advance Directive Documentation     Most  Recent Value  Type of Advance Directive  Out of facility DNR (pink MOST or yellow form)  Pre-existing out of facility DNR order (yellow form or pink MOST form)  Yellow form placed in chart (order not valid for inpatient use)  "MOST" Form in Place?  -       Prognosis:   Unable to  determine guarded  Discharge Planning:  To Be Determined  Care plan was discussed with patient, son, Dr. Darrick Meigs, SW  Thank you for allowing the Palliative Medicine Team to assist in the care of this patient.   Time In: 1010 Time Out: 1110 Total Time 30min Prolonged Time Billed  no      Greater than 50%  of this time was spent counseling and coordinating care related to the above assessment and plan.  Ihor Dow, FNP-C Palliative Medicine Team  Phone: [phone] Fax: [phone]  Please contact Palliative Medicine Team phone at [phone] for questions and concerns.

## 2017-10-06 NOTE — Progress Notes (Signed)
Nutrition Follow-up  DOCUMENTATION CODES:   Severe malnutrition in context of chronic illness, Underweight  INTERVENTION:  - Continue Ensure Enlive BID. - Continue to encourage PO intakes.   NUTRITION DIAGNOSIS:   Severe Malnutrition related to chronic illness(paraparesis of BLE) as evidenced by severe muscle depletion, severe fat depletion. -ongoing  GOAL:   Patient will meet greater than or equal to 90% of their needs -met on average  MONITOR:   PO intake, Supplement acceptance, Weight trends, Labs, Skin  ASSESSMENT:   84 y.o. male with medical history significant for spinal epidural abscess, lumbar radiculopathy, paraparesis of lower limbs, chronic osteomyelitis of lumbar spine, frequent falls, malnutrition, failure to thrive. He presented to the ED from SNF with complaints of dyspnea and persistent productive cough. Patient was discharged from the hospital the day prior (09/29/17) after being admitted for right upper lobe CAP and vertebral osteomyelitis/discitis. Admitted for suspected aspiration pneumonia versus pneumonitis versus HCAP.  Weight stable throughout admission. Flow sheet indicates pt continues with severe/deep edema to BLE and edema to BUE now decreased to mild edema. Per flow sheet, pt consumed 90% of breakfast (585 kcal, 24 grams of protein) and 95% of lunch (740 kcal, 20 grams of protein) on 4/21 and 75% of dinner (530 kcal, 25 grams of protein) on 4/22. Pt has accepted 9 of 11 bottles of Ensure.   Discharge summary was added yesterday but d/c was subsequently canceled d/t [facility] not accepting pt back d/t decline on condition. Possible d/c to SNF vs. LTAC.    Medications reviewed; 325 mg ferrous sulfate/day, 20 mg oral Lasix BID, 40 mg Solu-medrol TID, daily multivitamin with minerals. Labs reviewed; BUN: 29 mg/dL, Ca: 7.4 mg/dL.      Diet Order:  DIET - DYS 1 Room service appropriate? Yes; Fluid consistency: Thin Diet - low sodium heart  healthy  EDUCATION NEEDS:   No education needs have been identified at this time  Skin:  Skin Assessment: Skin Integrity Issues: Skin Integrity Issues:: Stage III, Unstageable, Other (Comment) Stage I: R hip Stage III: L IT x2, sacrum Unstageable: full thickness to L hip and L back Other: L venous stasis ulcer  Last BM:  4/23  Height:   Ht Readings from Last 1 Encounters:  09/30/17 '6\' 2"'  (1.88 m)    Weight:   Wt Readings from Last 1 Encounters:  10/06/17 140 lb 3.4 oz (63.6 kg)    Ideal Body Weight:  86.36 kg  BMI:  Body mass index is 18 kg/m.  Estimated Nutritional Needs:   Kcal:  2090-2280 (33-36 kcal/kg)  Protein:  108-125 grams (1.7-2 grams/kg)  Fluid:  >/= 2 L/day      Jarome Matin, MS, RD, LDN, [facility] Inpatient Clinical Dietitian Pager # [phone] After hours/weekend pager # [phone]

## 2017-10-07 DIAGNOSIS — J189 Pneumonia, unspecified organism: Secondary | ICD-10-CM

## 2017-10-07 DIAGNOSIS — Z9189 Other specified personal risk factors, not elsewhere classified: Secondary | ICD-10-CM

## 2017-10-07 DIAGNOSIS — R1310 Dysphagia, unspecified: Secondary | ICD-10-CM

## 2017-10-07 LAB — BASIC METABOLIC PANEL
Anion gap: 10 (ref 5–15)
BUN: 28 mg/dL — AB (ref 6–20)
CALCIUM: 7.5 mg/dL — AB (ref 8.9–10.3)
CO2: 25 mmol/L (ref 22–32)
CREATININE: 0.78 mg/dL (ref 0.61–1.24)
Chloride: 100 mmol/L — ABNORMAL LOW (ref 101–111)
GFR calc Af Amer: >60 mL/min (ref >60)
GFR calc non Af Amer: >60 mL/min (ref >60)
GLUCOSE: 95 mg/dL (ref 65–99)
Potassium: 3.8 mmol/L (ref 3.5–5.1)
Sodium: 135 mmol/L (ref 135–145)

## 2017-10-07 LAB — CBC
HEMATOCRIT: 26.6 % — AB (ref 39.0–52.0)
Hemoglobin: 8.4 g/dL — ABNORMAL LOW (ref 13.0–17.0)
MCH: 27.6 pg (ref 26.0–34.0)
MCHC: 31.6 g/dL (ref 30.0–36.0)
MCV: 87.5 fL (ref 78.0–100.0)
Platelets: 149 10*3/uL — ABNORMAL LOW (ref 150–400)
RBC: 3.04 MIL/uL — ABNORMAL LOW (ref 4.22–5.81)
RDW: 17.5 % — AB (ref 11.5–15.5)
WBC: 3.2 10*3/uL — AB (ref 4.0–10.5)

## 2017-10-07 MED ORDER — LEVOFLOXACIN 500 MG PO TABS
500.0000 mg | ORAL_TABLET | Freq: Every day | ORAL | Status: DC
  Administered 2017-10-07: 500 mg via ORAL
  Filled 2017-10-07: qty 1

## 2017-10-07 NOTE — Clinical Social Work Placement (Signed)
Patient received and accepted bed offer at [facility]. Facility aware of patient's discharge and confirmed bed offer. PTAR contacted, patient's family notified. Patient's RN can call report to [phone], packet complete. CSW signing off, no other needs identified.  CLINICAL SOCIAL WORK PLACEMENT  NOTE  Date:  10/07/2017  Patient Details  Name: Derrick Sanchez MRN: [id number] Date of Birth: 01-31-1931  Clinical Social Work is seeking post-discharge placement for this patient at the [place] level of care (*CSW will initial, date and re-position this form in  chart as items are completed):  Yes   Patient/family provided with [place] Work Department's list of facilities offering this level of care within the geographic area requested by the patient (or if unable, by the patient's family).  Yes   Patient/family informed of their freedom to choose among providers that offer the needed level of care, that participate in Medicare, Medicaid or managed care program needed by the patient, have an available bed and are willing to accept the patient.  Yes   Patient/family informed of [facility]'s ownership interest in [facility] and [facility], as well as of the fact that they are under no obligation to receive care at these facilities.  PASRR submitted to EDS on       PASRR number received on       Existing PASRR number confirmed on 10/05/17     FL2 transmitted to all facilities in geographic area requested by pt/family on 10/05/17     FL2 transmitted to all facilities within larger geographic area on       Patient informed that his/her managed care company has contracts with or will negotiate with certain facilities, including the following:        Yes   Patient/family informed of bed offers received.  Patient chooses bed at [facility]     Physician recommends and patient chooses bed at      Patient to be transferred to [facility]  on 10/07/17.  Patient to be transferred to facility by PTAR     Patient family notified on 10/07/17 of transfer.  Name of family member notified:  [facility]     PHYSICIAN       Additional Comment:    _______________________________________________ Burnis Medin, LCSW 10/07/2017, 4:20 PM

## 2017-10-07 NOTE — Progress Notes (Addendum)
[facility] transported patient to [facility].  Family aware.  Belongings sent with patient. Attempted to call report x2 times, unable to get in touch with receiving RN.  RN asked [facility] to have facility call writer when they arrive to facility.

## 2017-10-07 NOTE — Progress Notes (Signed)
CSW following patient to assist with discharge planning to SNF for [facility] term care with hospice services following.   CSW informed by [facility] admissions staff member Irine Seal that they have no Caleigha Zale term care beds available.  CSW followed up with patient's son and asked if he spoke with [facility] regarding patient's ability to return. Patient's son reported that no one got back with him. CSW updated patient's son about [facility] having no availability today. CSW provided patient's son with additional bed offers. Patient's son selected [facility].   [facility] admissions staff confirmed ability to accept patient and provide suction needs.   Patient to discharge to [facility] for [facility] term care with hospice following. Patient will private pay for [facility] term care.  Abundio Miu, [place] Social Worker [facility] Cell#: [phone]
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
## 2017-10-07 NOTE — NC FL2 (Signed)
[place] LEVEL OF CARE SCREENING TOOL     IDENTIFICATION  Patient Name: Derrick Sanchez Birthdate: 24-Jan-1931 Sex: male Admission Date (Current Location): 09/30/2017  [facility] and [place] Number:  Herbalist and Address:  [facility],  [place], [place]      Provider Number: [id number]  Attending Physician Name and Address:  Kerney Elbe, DO  Relative Name and Phone Number:       Current Level of Care: Hospital Recommended Level of Care: [place] Prior Approval Number:    Date Approved/Denied: 10/05/17 PASRR Number: [id number]  Discharge Plan: SNF    Current Diagnoses: Patient Active Problem List   Diagnosis Date Noted  . Dysphagia   . HCAP (healthcare-associated pneumonia) 09/30/2017  . Hypoxia   . At risk for aspiration   . Pressure injury of skin 09/22/2017  . Multiple closed fractures of ribs of right side   . Pleural effusion   . Palliative care by specialist   . CAP (community acquired pneumonia) 09/20/2017  . Acute on chronic respiratory failure with hypoxia ([place]) 09/20/2017  . Malnutrition ([place]) 09/20/2017  . FTT (failure to thrive) in adult 09/20/2017  . PVD (peripheral vascular disease) ([place]) 08/04/2016  . Goals of care, counseling/discussion 08/04/2016  . Urinary hesitancy 11/26/2015  . Essential hypertension 01/05/2015  . Lumbar radiculopathy 11/23/2014  . Spinal epidural abscess 03/12/2014  . Protein-calorie malnutrition, severe ([place]) 03/01/2014  . Frequent falls 02/27/2014  . Trochanteric fracture ([place]) 02/27/2014  . Lumbar stenosis with neurogenic claudication 02/06/2014  . Stiffness of joints, not elsewhere classified, multiple sites 12/22/2012  . Postural imbalance 12/22/2012  . Hx of falling 12/22/2012  . HYPERPARATHYROIDISM UNSPECIFIED 05/03/2010  . OBSTRUCTIVE SLEEP APNEA 05/03/2010  . GLAUCOMA 05/03/2010  . ASTHMA 05/03/2010  . ANEMIA, VITAMIN B12 DEFICIENCY  06/14/2007  . RESTLESS LEG SYNDROME 06/14/2007  . GASTROESOPHAGEAL REFLUX DISEASE 06/14/2007  . DIVERTICULOSIS, COLON 05/31/2007  . POLYP, COLON 05/27/2002    Orientation RESPIRATION BLADDER Height & Weight     Self, Place,Time,Situation   O2 nasal cannula 2 Liters/min   External catheter Weight: 147 lb 14.9 oz (67.1 kg) Height:  6\' 2"  (188 cm)  BEHAVIORAL SYMPTOMS/MOOD NEUROLOGICAL BOWEL NUTRITION STATUS      Continent Diet(Low Sodium Heart Healthy)  AMBULATORY STATUS COMMUNICATION OF NEEDS Skin   Extensive Assist Verbally Pressure Injury 09/30/17 Stage III -  Full thickness tissue loss. Subcutaneous fat may be visible but bone, tendon or muscle are NOT exposed. Location: Ischial tuberosity Location Orientation: Left Foam Dressing PRN Pressure Injury 09/30/17 Stage III -  Full thickness tissue loss. Subcutaneous fat may be visible but bone, tendon or muscle are NOT exposed Location: Sacrum Foam Dressing Pressure Injury 09/20/17 Unstageable - Full thickness tissue loss in which the base of the ulcer is covered by slough (yellow, tan, gray, green or brown and/or eschar (tan, brown or black) in the wound bed 4.5CMx2CM brown slough covering wound. Location: Hip Location Orientation: Left  Foam Dressing PRN Pressure Injury 09/20/17 Unstageable - Full thickness tissue loss in which the base of the ulcer is covered by slough (yellow, tan, gray, green or brown and/or eschar (tan, brown or black) in the wound bed 3CMx2CM; left lower back; Foam Dressing PRN Pressure Injury 09/20/17 Stage I -  Intact skin with non-blanchable redness of a localized area usually over a bony prominence. Location: Hip Location Orientation: Right Foam Dressing PRN Wound / Incision (Open or Dehisced) 09/20/17 Venous  stasis ulcer Leg Left ulcer with slough to wound bed; Impregnated Gauze changed [place] Level of Assistance  Bathing, Feeding, Dressing Bathing  Assistance: Maximum assistance Feeding assistance: Independent Dressing Assistance: Maximum assistance     Functional Limitations Info  Sight, Hearing, Speech Sight Info: Impaired Hearing Info: Impaired Speech Info: Adequate    SPECIAL CARE FACTORS FREQUENCY  Oral Suctioning     suction device: yankauer Secretion amount: moderate Suction Tolerance: Tolerated Well          Contractures Contractures Info: Not present    Additional Factors Info  Code Status, Allergies     CODE STATUS: Partial Code Allergies Info: nka           Current Medications (10/07/2017):  This is the current hospital active medication list Current Facility-Administered Medications  Medication Dose Route Frequency Provider Last Rate Last Dose  . acetaminophen (TYLENOL) tablet 650 mg  650 mg Oral Q6H PRN Irene Pap N, DO      . feeding supplement (ENSURE ENLIVE) (ENSURE ENLIVE) liquid 237 mL  237 mL Oral BID BM Oswald Hillock, MD   237 mL at 10/07/17 1034  . ferrous sulfate tablet 325 mg  325 mg Oral QPC breakfast Kayleen Memos, DO   325 mg at 10/07/17 1033  . furosemide (LASIX) tablet 20 mg  20 mg Oral BID Oswald Hillock, MD   20 mg at 10/07/17 1033  . guaiFENesin (MUCINEX) 12 hr tablet 600 mg  600 mg Oral BID Irene Pap N, DO   600 mg at 10/07/17 1034  . HYDROcodone-acetaminophen (NORCO/VICODIN) 5-325 MG per tablet 1 tablet  1 tablet Oral Q6H PRN Irene Pap N, DO   1 tablet at 10/07/17 0525  . ipratropium-albuterol (DUONEB) 0.5-2.5 (3) MG/3ML nebulizer solution 3 mL  3 mL Nebulization Q2H PRN Hall, Carole N, DO      . ipratropium-albuterol (DUONEB) 0.5-2.5 (3) MG/3ML nebulizer solution 3 mL  3 mL Nebulization BID Oswald Hillock, MD   3 mL at 10/07/17 1141  . lactobacillus (FLORANEX/LACTINEX) granules 1 g  1 g Oral TID WC Hall, Carole N, DO   1 g at 10/07/17 1037  . latanoprost (XALATAN) 0.005 % ophthalmic solution 1 drop  1 drop Both Eyes QHS Hall, Carole N, DO   1 drop at 10/06/17 2244  .  levofloxacin (LEVAQUIN) tablet 500 mg  500 mg Oral Daily Sheikh, Omair Latif, DO      . methylPREDNISolone sodium succinate (SOLU-MEDROL) 40 mg/mL injection 40 mg  40 mg Intravenous Q8H Oswald Hillock, MD   40 mg at 10/07/17 0524  . multivitamin with minerals tablet 1 tablet  1 tablet Oral Daily Oswald Hillock, MD   1 tablet at 10/07/17 1033  . pantoprazole (PROTONIX) EC tablet 40 mg  40 mg Oral Daily Irene Pap N, DO   40 mg at 10/07/17 1033  . pramipexole (MIRAPEX) tablet 1 mg  1 mg Oral TID Irene Pap N, DO   1 mg at 10/07/17 1038  . sodium chloride HYPERTONIC 3 % nebulizer solution 4 mL  4 mL Nebulization BID Oswald Hillock, MD   4 mL at 10/07/17 1141     Discharge Medications: Please see discharge summary for a list of discharge medications.  Relevant Imaging Results:  Relevant Lab Results:   Additional Information ssn: [id number]  Burnis Medin, LCSW

## 2017-10-07 NOTE — Progress Notes (Addendum)
Pt received a Tracey at [facility] that she needs a clarification on Levofloxacin, specifically a stop date and pt also needs a hard script for the narcotic.  CSW spoke to pt's RN who will page the provider, obtain the stop date for the above and give it to the facility when the RN calls report.  CSW updated Linus Orn at [facility] at ph: [phone] and Olivia Mackie voiced understanding.  5:59 PM CSW spoke to pt';s RN.  RN stated pt's provider stated pt will have to "stay on the medicine until the pt follows up with the Infectious Disease doctor Dr. Johnnye Sima after D/C.  CSW updated Olivia Mackie and Olivia Mackie voiced understanding.  Please reconsult if future social work needs arise.  CSW signing off, as social work intervention is no longer needed.  Alphonse Guild. Delfina Schreurs, LCSW, LCAS, CSI Clinical Social Worker Ph: [phone]

## 2017-10-07 NOTE — Progress Notes (Signed)
Daily Progress Note   Patient Name: Derrick Sanchez       Date: 10/07/2017 DOB: 1931/06/06  Age: 84 y.o. MRN#: [id number] Attending Physician: Kerney Elbe, DO Primary Care Physician: [name], MD Admit Date: 09/30/2017  Reason for Consultation/Follow-up: Establishing goals of care  Subjective:  Patient awake, alert, oriented. Denies pain or discomfort. Eating during my visit.  GOC:  Son, Derrick Sanchez at bedside. Again asks many questions regarding discharge plan. Derrick Sanchez tells me insurance has denied him for rehab. Rick's goal is for his father to go to SNF for LTC and eventually return home with caregivers once the house is prepared. Discussed hospice services and philosophy, understanding goal of comfort, quality, and dignity. Also focus on symptom management and preventing re-hospitalization. Rick requests hospice services at [facility] and then transition home with hospice services.   Length of Stay: 7  Current Medications: Scheduled Meds:  . feeding supplement (ENSURE ENLIVE)  237 mL Oral BID BM  . ferrous sulfate  325 mg Oral QPC breakfast  . furosemide  20 mg Oral BID  . guaiFENesin  600 mg Oral BID  . ipratropium-albuterol  3 mL Nebulization BID  . lactobacillus  1 g Oral TID WC  . latanoprost  1 drop Both Eyes QHS  . methylPREDNISolone (SOLU-MEDROL) injection  40 mg Intravenous Q8H  . multivitamin with minerals  1 tablet Oral Daily  . pantoprazole  40 mg Oral Daily  . pramipexole  1 mg Oral TID  . sodium chloride HYPERTONIC  4 mL Nebulization BID    Continuous Infusions: . piperacillin-tazobactam (ZOSYN)  IV 3.375 g (10/07/17 0525)    PRN Meds: acetaminophen, HYDROcodone-acetaminophen, ipratropium-albuterol  Physical Exam  Constitutional: He is cooperative.  HENT:  Head:  Normocephalic and atraumatic.  Pulmonary/Chest: No accessory muscle usage. No tachypnea. No respiratory distress.  Abdominal: Normal appearance.  Neurological: He is alert.  Oriented to person/place  Skin: Skin is warm and dry.  Psychiatric: He has a normal mood and affect. His speech is normal and behavior is normal.  Nursing note and vitals reviewed.          Vital Signs: BP 130/64 (BP Location: Right Arm)   Pulse (!) 47   Temp 98 F (36.7 C) (Oral)   Resp 17   Ht 6\' 2"  (1.88 m)  Wt 67.1 kg (147 lb 14.9 oz)   SpO2 94%   BMI 18.99 kg/m  SpO2: SpO2: 94 % O2 Device: O2 Device: Nasal Cannula O2 Flow Rate: O2 Flow Rate (L/min): 2 L/min  Intake/output summary:   Intake/Output Summary (Last 24 hours) at 10/07/2017 1008 Last data filed at 10/07/2017 3810 Gross per 24 hour  Intake 380 ml  Output 1925 ml  Net -1545 ml   LBM: Last BM Date: 10/06/17 Baseline Weight: Weight: 63.3 kg (139 lb 8.8 oz) Most recent weight: Weight: 67.1 kg (147 lb 14.9 oz)       Palliative Assessment/Data: PPS 30%   Flowsheet Rows     Most Recent Value  Intake Tab  Referral Department  Hospitalist  Unit at Time of Referral  Med/Surg Unit  Palliative Care Type  Return patient Palliative Care  Reason for referral  Clarify Goals of Care  Date first seen by Palliative Care  10/01/17  Clinical Assessment  Palliative Performance Scale Score  30%  Psychosocial & Spiritual Assessment  Palliative Care Outcomes  Patient/Family meeting held?  Yes  Who was at the meeting?  patient and son  Palliative Care Outcomes  Clarified goals of care, ACP counseling assistance, Provided psychosocial or spiritual support, Provided end of life care assistance      Patient Active Problem List   Diagnosis Date Noted  . Dysphagia   . HCAP (healthcare-associated pneumonia) 09/30/2017  . Hypoxia   . At risk for aspiration   . Pressure injury of skin 09/22/2017  . Multiple closed fractures of ribs of right side   .  Pleural effusion   . Palliative care by specialist   . CAP (community acquired pneumonia) 09/20/2017  . Acute on chronic respiratory failure with hypoxia ([place]) 09/20/2017  . Malnutrition ([place]) 09/20/2017  . FTT (failure to thrive) in adult 09/20/2017  . PVD (peripheral vascular disease) ([place]) 08/04/2016  . Goals of care, counseling/discussion 08/04/2016  . Urinary hesitancy 11/26/2015  . Essential hypertension 01/05/2015  . Lumbar radiculopathy 11/23/2014  . Spinal epidural abscess 03/12/2014  . Protein-calorie malnutrition, severe ([place]) 03/01/2014  . Frequent falls 02/27/2014  . Trochanteric fracture ([place]) 02/27/2014  . Lumbar stenosis with neurogenic claudication 02/06/2014  . Stiffness of joints, not elsewhere classified, multiple sites 12/22/2012  . Postural imbalance 12/22/2012  . Hx of falling 12/22/2012  . HYPERPARATHYROIDISM UNSPECIFIED 05/03/2010  . OBSTRUCTIVE SLEEP APNEA 05/03/2010  . GLAUCOMA 05/03/2010  . ASTHMA 05/03/2010  . ANEMIA, VITAMIN B12 DEFICIENCY 06/14/2007  . RESTLESS LEG SYNDROME 06/14/2007  . GASTROESOPHAGEAL REFLUX DISEASE 06/14/2007  . DIVERTICULOSIS, COLON 05/31/2007  . POLYP, COLON 05/27/2002    Palliative Care Assessment & Plan   Patient Profile: 84 y.o. male  with past medical history of back surgery s/p complications with discitis and osteomyelitis requiring long-term antibiotics, dysphagia, falls, hiatal hernia, HTN, DVT, esophageal stricture, and asthmaadmitted on 4/7/2019with right chest pain, cough and congestion.About two weeks ago, the patient suffered from a fall when transferring from motorized scooter to bed. Visited with neurosurgeon who imaged his back. Per son, no mention of rib fractures at that time. Continued to have right-sided chest pain despite oxycodone. In ED, chest xray revealed multiple right rib fractures, right upper lobe pneumonia, and large left pleural effusion with midlung airspace opacities which could represent  pneumonia. CT chest revealed moderate bilateral pleural effusions.  Patient re-admitted on 09/30/17 from SNF with shortness of breath and cough. Chest xray in ED revealed bilateral infiltrates more prominent in RML and  increased pulmonary vascularity. Hypoxic with oxygen saturations between 88-90% on 3L. Started on IV zosyn and vancomycin. Also with chronic diastolic heart failure with EF 60-65%. ECHO on 09/22/16 revealed Grade 1 diastolic dysfunction. Palliative medicine consultation for goals of care.   Assessment: Acute hypoxic respiratory failure Acute on chronic diastolic CHF Failure to thrive/malnutrition Anemia Chronic vertebral osteomyelitis Chronic low back pain Hx of lower extremity DVT  Recommendations/Plan:  Limited code while in hospital.   Son requests "DNR" when transferred to SNF. He does not want to complete MOST form with me today.   Discussed palliative versus hospice options on discharge. Educated on hospice philosophy.   Insurance has denied rehab at [facility]. Plan is for L[facility]. Son requesting hospice services on discharge. Goal is for patient to eventually return home with 24 hour caregivers and hospice. SW notified to arrange hospice services at [facility] on discharge.   Code Status: Limited   Code Status Orders  (From admission, onward)        Start     Ordered   10/01/17 1446  Limited resuscitation (code)  Continuous    Question Answer Comment  In the event of cardiac or respiratory ARREST: Initiate Code Blue, Call Rapid Response Yes   In the event of cardiac or respiratory ARREST: Perform CPR No   In the event of cardiac or respiratory ARREST: Perform Intubation/Mechanical Ventilation No   In the event of cardiac or respiratory ARREST: Use NIPPV/BiPAp only if indicated Yes   In the event of cardiac or respiratory ARREST: Administer ACLS medications if indicated Yes   In the event of cardiac or respiratory ARREST: Perform Defibrillation or Cardioversion if  indicated Yes      10/01/17 1450    Code Status History    Date Active Date Inactive Code Status Order ID Comments User Context   10/01/2017 0845 10/01/2017 1450 Full Code [id number]  Oswald Hillock, MD Inpatient   10/01/2017 0507 10/01/2017 0845 DNR [id number]  Jani Gravel, MD Inpatient   09/30/2017 1508 10/01/2017 0507 Full Code [id number]  Kayleen Memos, DO ED   09/30/2017 1449 09/30/2017 1508 DNR [id number]  Kayleen Memos, DO ED   09/30/2017 1436 09/30/2017 1449 Full Code [id number]  Kayleen Memos, DO ED   09/29/2017 1418 09/29/2017 1848 DNR [id number]  Roxan Hockey, MD Inpatient   09/20/2017 1703 09/29/2017 1418 Full Code [id number]  Erline Hau, MD ED   11/23/2014 1053 11/24/2014 1257 Full Code [id number]  Jovita Gamma, MD Inpatient   10/16/2014 1014 10/17/2014 0330 Full Code [id number]  Marybelle Killings, MD [facility]   03/12/2014 2043 03/16/2014 1751 Full Code [id number]  Elaina Hoops, MD Inpatient   02/27/2014 1235 03/03/2014 2024 Full Code [id number]  Cristal Ford, DO Inpatient   02/06/2014 1931 02/08/2014 1419 Full Code [id number]  Hosie Spangle, MD Inpatient    Advance Directive Documentation     Most Recent Value  Type of Advance Directive  Out of facility DNR (pink MOST or yellow form)  Pre-existing out of facility DNR order (yellow form or pink MOST form)  Yellow form placed in chart (order not valid for inpatient use)  "MOST" Form in Place?  -       Prognosis:   Unable to determine guarded   Discharge Planning:  To Be Determined  Care plan was discussed with patient, son, Dr. Alfredia Ferguson, SW  Thank you for allowing the Palliative Medicine Team to assist in the care of this patient.  Time In: 0935 Time Out: 1010 Total Time 16min Prolonged Time Billed  no      Greater than 50%  of this time was spent counseling and coordinating care related to the above assessment and plan.  Ihor Dow, FNP-C Palliative Medicine Team  Phone: [phone] Fax: [phone]  Please contact  Palliative Medicine Team phone at [phone] for questions and concerns.

## 2017-10-07 NOTE — Progress Notes (Deleted)
Went over discharge papers with patient.  All questions answered.  VSS.  IV removed.  Pt stated he will call a cab to take him home.

## 2017-10-09 DIAGNOSIS — M545 Low back pain: Secondary | ICD-10-CM | POA: Diagnosis not present

## 2017-10-12 DIAGNOSIS — Z79899 Other long term (current) drug therapy: Secondary | ICD-10-CM | POA: Diagnosis not present

## 2017-10-13 ENCOUNTER — Other Ambulatory Visit: Payer: Self-pay

## 2017-10-13 ENCOUNTER — Inpatient Hospital Stay (HOSPITAL_COMMUNITY)
Admission: EM | Admit: 2017-10-13 | Discharge: 2017-10-20 | DRG: 193 | Disposition: A | Discharge status: Hospice | Payer: PPO | Attending: Internal Medicine | Admitting: Internal Medicine

## 2017-10-13 ENCOUNTER — Emergency Department (HOSPITAL_COMMUNITY): Payer: PPO

## 2017-10-13 ENCOUNTER — Encounter (HOSPITAL_COMMUNITY): Payer: Self-pay

## 2017-10-13 DIAGNOSIS — K219 Gastro-esophageal reflux disease without esophagitis: Secondary | ICD-10-CM | POA: Diagnosis present

## 2017-10-13 DIAGNOSIS — R7881 Bacteremia: Secondary | ICD-10-CM | POA: Diagnosis present

## 2017-10-13 DIAGNOSIS — J9621 Acute and chronic respiratory failure with hypoxia: Secondary | ICD-10-CM | POA: Diagnosis present

## 2017-10-13 DIAGNOSIS — Z7189 Other specified counseling: Secondary | ICD-10-CM

## 2017-10-13 DIAGNOSIS — E213 Hyperparathyroidism, unspecified: Secondary | ICD-10-CM | POA: Diagnosis present

## 2017-10-13 DIAGNOSIS — L8990 Pressure ulcer of unspecified site, unspecified stage: Secondary | ICD-10-CM | POA: Diagnosis present

## 2017-10-13 DIAGNOSIS — R091 Pleurisy: Secondary | ICD-10-CM | POA: Diagnosis not present

## 2017-10-13 DIAGNOSIS — G8929 Other chronic pain: Secondary | ICD-10-CM | POA: Diagnosis present

## 2017-10-13 DIAGNOSIS — R0602 Shortness of breath: Secondary | ICD-10-CM | POA: Diagnosis not present

## 2017-10-13 DIAGNOSIS — G061 Intraspinal abscess and granuloma: Secondary | ICD-10-CM

## 2017-10-13 DIAGNOSIS — R159 Full incontinence of feces: Secondary | ICD-10-CM | POA: Diagnosis not present

## 2017-10-13 DIAGNOSIS — Z515 Encounter for palliative care: Secondary | ICD-10-CM | POA: Diagnosis not present

## 2017-10-13 DIAGNOSIS — Y95 Nosocomial condition: Secondary | ICD-10-CM | POA: Diagnosis present

## 2017-10-13 DIAGNOSIS — Z9189 Other specified personal risk factors, not elsewhere classified: Secondary | ICD-10-CM | POA: Diagnosis not present

## 2017-10-13 DIAGNOSIS — G8220 Paraplegia, unspecified: Secondary | ICD-10-CM | POA: Diagnosis present

## 2017-10-13 DIAGNOSIS — L89150 Pressure ulcer of sacral region, unstageable: Secondary | ICD-10-CM | POA: Diagnosis not present

## 2017-10-13 DIAGNOSIS — R296 Repeated falls: Secondary | ICD-10-CM | POA: Diagnosis not present

## 2017-10-13 DIAGNOSIS — J869 Pyothorax without fistula: Secondary | ICD-10-CM | POA: Diagnosis not present

## 2017-10-13 DIAGNOSIS — Z8661 Personal history of infections of the central nervous system: Secondary | ICD-10-CM

## 2017-10-13 DIAGNOSIS — Z86718 Personal history of other venous thrombosis and embolism: Secondary | ICD-10-CM

## 2017-10-13 DIAGNOSIS — M4626 Osteomyelitis of vertebra, lumbar region: Secondary | ICD-10-CM | POA: Diagnosis not present

## 2017-10-13 DIAGNOSIS — K222 Esophageal obstruction: Secondary | ICD-10-CM | POA: Diagnosis present

## 2017-10-13 DIAGNOSIS — Z792 Long term (current) use of antibiotics: Secondary | ICD-10-CM

## 2017-10-13 DIAGNOSIS — D519 Vitamin B12 deficiency anemia, unspecified: Secondary | ICD-10-CM | POA: Diagnosis present

## 2017-10-13 DIAGNOSIS — J90 Pleural effusion, not elsewhere classified: Secondary | ICD-10-CM | POA: Diagnosis present

## 2017-10-13 DIAGNOSIS — B372 Candidiasis of skin and nail: Secondary | ICD-10-CM | POA: Diagnosis present

## 2017-10-13 DIAGNOSIS — D509 Iron deficiency anemia, unspecified: Secondary | ICD-10-CM | POA: Diagnosis present

## 2017-10-13 DIAGNOSIS — I110 Hypertensive heart disease with heart failure: Secondary | ICD-10-CM | POA: Diagnosis not present

## 2017-10-13 DIAGNOSIS — E43 Unspecified severe protein-calorie malnutrition: Secondary | ICD-10-CM | POA: Diagnosis not present

## 2017-10-13 DIAGNOSIS — Z96641 Presence of right artificial hip joint: Secondary | ICD-10-CM | POA: Diagnosis present

## 2017-10-13 DIAGNOSIS — R05 Cough: Secondary | ICD-10-CM | POA: Diagnosis not present

## 2017-10-13 DIAGNOSIS — R5381 Other malaise: Secondary | ICD-10-CM | POA: Diagnosis present

## 2017-10-13 DIAGNOSIS — R069 Unspecified abnormalities of breathing: Secondary | ICD-10-CM | POA: Diagnosis not present

## 2017-10-13 DIAGNOSIS — S30810A Abrasion of lower back and pelvis, initial encounter: Secondary | ICD-10-CM | POA: Diagnosis present

## 2017-10-13 DIAGNOSIS — I5030 Unspecified diastolic (congestive) heart failure: Secondary | ICD-10-CM | POA: Diagnosis not present

## 2017-10-13 DIAGNOSIS — Z9889 Other specified postprocedural states: Secondary | ICD-10-CM

## 2017-10-13 DIAGNOSIS — G4733 Obstructive sleep apnea (adult) (pediatric): Secondary | ICD-10-CM | POA: Diagnosis present

## 2017-10-13 DIAGNOSIS — Z96652 Presence of left artificial knee joint: Secondary | ICD-10-CM | POA: Diagnosis present

## 2017-10-13 DIAGNOSIS — J189 Pneumonia, unspecified organism: Secondary | ICD-10-CM | POA: Diagnosis not present

## 2017-10-13 DIAGNOSIS — I10 Essential (primary) hypertension: Secondary | ICD-10-CM | POA: Diagnosis not present

## 2017-10-13 DIAGNOSIS — B9689 Other specified bacterial agents as the cause of diseases classified elsewhere: Secondary | ICD-10-CM | POA: Diagnosis not present

## 2017-10-13 DIAGNOSIS — J918 Pleural effusion in other conditions classified elsewhere: Secondary | ICD-10-CM | POA: Diagnosis not present

## 2017-10-13 DIAGNOSIS — H9190 Unspecified hearing loss, unspecified ear: Secondary | ICD-10-CM | POA: Diagnosis present

## 2017-10-13 DIAGNOSIS — R627 Adult failure to thrive: Secondary | ICD-10-CM | POA: Diagnosis not present

## 2017-10-13 DIAGNOSIS — Z981 Arthrodesis status: Secondary | ICD-10-CM

## 2017-10-13 DIAGNOSIS — I82409 Acute embolism and thrombosis of unspecified deep veins of unspecified lower extremity: Secondary | ICD-10-CM | POA: Diagnosis not present

## 2017-10-13 DIAGNOSIS — G2581 Restless legs syndrome: Secondary | ICD-10-CM | POA: Diagnosis present

## 2017-10-13 DIAGNOSIS — R69 Illness, unspecified: Secondary | ICD-10-CM

## 2017-10-13 DIAGNOSIS — Z681 Body mass index (BMI) 19 or less, adult: Secondary | ICD-10-CM

## 2017-10-13 DIAGNOSIS — R0902 Hypoxemia: Secondary | ICD-10-CM

## 2017-10-13 DIAGNOSIS — L89220 Pressure ulcer of left hip, unstageable: Secondary | ICD-10-CM | POA: Diagnosis not present

## 2017-10-13 DIAGNOSIS — L89152 Pressure ulcer of sacral region, stage 2: Secondary | ICD-10-CM | POA: Diagnosis present

## 2017-10-13 DIAGNOSIS — R1310 Dysphagia, unspecified: Secondary | ICD-10-CM | POA: Diagnosis not present

## 2017-10-13 DIAGNOSIS — Z87828 Personal history of other (healed) physical injury and trauma: Secondary | ICD-10-CM

## 2017-10-13 DIAGNOSIS — M1990 Unspecified osteoarthritis, unspecified site: Secondary | ICD-10-CM | POA: Diagnosis present

## 2017-10-13 DIAGNOSIS — H532 Diplopia: Secondary | ICD-10-CM | POA: Diagnosis present

## 2017-10-13 DIAGNOSIS — J690 Pneumonitis due to inhalation of food and vomit: Secondary | ICD-10-CM | POA: Diagnosis present

## 2017-10-13 DIAGNOSIS — Z79899 Other long term (current) drug therapy: Secondary | ICD-10-CM

## 2017-10-13 DIAGNOSIS — I825Y9 Chronic embolism and thrombosis of unspecified deep veins of unspecified proximal lower extremity: Secondary | ICD-10-CM | POA: Diagnosis not present

## 2017-10-13 DIAGNOSIS — H409 Unspecified glaucoma: Secondary | ICD-10-CM | POA: Diagnosis present

## 2017-10-13 DIAGNOSIS — M545 Low back pain: Secondary | ICD-10-CM | POA: Diagnosis present

## 2017-10-13 DIAGNOSIS — Z7901 Long term (current) use of anticoagulants: Secondary | ICD-10-CM

## 2017-10-13 DIAGNOSIS — J45909 Unspecified asthma, uncomplicated: Secondary | ICD-10-CM | POA: Diagnosis present

## 2017-10-13 LAB — URINALYSIS, ROUTINE W REFLEX MICROSCOPIC
BILIRUBIN URINE: NEGATIVE
Glucose, UA: NEGATIVE mg/dL
Ketones, ur: NEGATIVE mg/dL
Nitrite: NEGATIVE
Protein, ur: NEGATIVE mg/dL
RBC / HPF: >50 RBC/hpf — ABNORMAL HIGH (ref 0–5)
Specific Gravity, Urine: 1.011 (ref 1.005–1.030)
pH: 7 (ref 5.0–8.0)

## 2017-10-13 LAB — EXPECTORATED SPUTUM ASSESSMENT W GRAM STAIN, RFLX TO RESP C

## 2017-10-13 LAB — CBC WITH DIFFERENTIAL/PLATELET
BASOS ABS: 0 10*3/uL (ref 0.0–0.1)
BASOS PCT: 0 %
Eosinophils Absolute: 0 10*3/uL (ref 0.0–0.7)
Eosinophils Relative: 0 %
HEMATOCRIT: 32 % — AB (ref 39.0–52.0)
HEMOGLOBIN: 9.8 g/dL — AB (ref 13.0–17.0)
Lymphocytes Relative: 9 %
Lymphs Abs: 0.8 10*3/uL (ref 0.7–4.0)
MCH: 27.7 pg (ref 26.0–34.0)
MCHC: 30.6 g/dL (ref 30.0–36.0)
MCV: 90.4 fL (ref 78.0–100.0)
MONO ABS: 0.5 10*3/uL (ref 0.1–1.0)
Monocytes Relative: 5 %
NEUTROS ABS: 8 10*3/uL — AB (ref 1.7–7.7)
NEUTROS PCT: 86 %
Platelets: 138 10*3/uL — ABNORMAL LOW (ref 150–400)
RBC: 3.54 MIL/uL — ABNORMAL LOW (ref 4.22–5.81)
RDW: 19.5 % — AB (ref 11.5–15.5)
WBC: 9.3 10*3/uL (ref 4.0–10.5)

## 2017-10-13 LAB — BASIC METABOLIC PANEL
ANION GAP: 9 (ref 5–15)
BUN: 29 mg/dL — ABNORMAL HIGH (ref 6–20)
CO2: 26 mmol/L (ref 22–32)
Calcium: 7.6 mg/dL — ABNORMAL LOW (ref 8.9–10.3)
Chloride: 103 mmol/L (ref 101–111)
Creatinine, Ser: 1.07 mg/dL (ref 0.61–1.24)
GFR calc Af Amer: >60 mL/min (ref >60)
Glucose, Bld: 88 mg/dL (ref 65–99)
POTASSIUM: 3.8 mmol/L (ref 3.5–5.1)
SODIUM: 138 mmol/L (ref 135–145)

## 2017-10-13 LAB — I-STAT CG4 LACTIC ACID, ED: Lactic Acid, Venous: 1.46 mmol/L (ref 0.5–1.9)

## 2017-10-13 LAB — I-STAT TROPONIN, ED: Troponin i, poc: 0.01 ng/mL (ref 0.00–0.08)

## 2017-10-13 LAB — PROTIME-INR
INR: 1.3
Prothrombin Time: 16.1 seconds — ABNORMAL HIGH (ref 11.4–15.2)

## 2017-10-13 LAB — APTT: APTT: 39 s — AB (ref 24–36)

## 2017-10-13 LAB — BRAIN NATRIURETIC PEPTIDE: B NATRIURETIC PEPTIDE 5: 89.4 pg/mL (ref 0.0–100.0)

## 2017-10-13 LAB — HEPARIN LEVEL (UNFRACTIONATED): HEPARIN UNFRACTIONATED: 1.34 [IU]/mL — AB (ref 0.30–0.70)

## 2017-10-13 MED ORDER — VANCOMYCIN HCL IN DEXTROSE 1-5 GM/200ML-% IV SOLN
1000.0000 mg | INTRAVENOUS | Status: DC
  Administered 2017-10-14 – 2017-10-17 (×4): 1000 mg via INTRAVENOUS
  Filled 2017-10-13 (×4): qty 200

## 2017-10-13 MED ORDER — HEPARIN BOLUS VIA INFUSION
3000.0000 [IU] | Freq: Once | INTRAVENOUS | Status: AC
  Administered 2017-10-13: 3000 [IU] via INTRAVENOUS
  Filled 2017-10-13: qty 3000

## 2017-10-13 MED ORDER — PRAMIPEXOLE DIHYDROCHLORIDE 1 MG PO TABS
1.0000 mg | ORAL_TABLET | Freq: Three times a day (TID) | ORAL | Status: DC
  Administered 2017-10-13 – 2017-10-20 (×20): 1 mg via ORAL
  Filled 2017-10-13 (×21): qty 1

## 2017-10-13 MED ORDER — IPRATROPIUM-ALBUTEROL 0.5-2.5 (3) MG/3ML IN SOLN
3.0000 mL | Freq: Once | RESPIRATORY_TRACT | Status: DC
  Filled 2017-10-13: qty 3

## 2017-10-13 MED ORDER — FUROSEMIDE 20 MG PO TABS
20.0000 mg | ORAL_TABLET | Freq: Two times a day (BID) | ORAL | Status: DC
  Administered 2017-10-14 (×2): 20 mg via ORAL
  Filled 2017-10-13 (×2): qty 1

## 2017-10-13 MED ORDER — SODIUM CHLORIDE 0.9 % IV BOLUS
1000.0000 mL | Freq: Once | INTRAVENOUS | Status: AC
  Administered 2017-10-13: 1000 mL via INTRAVENOUS

## 2017-10-13 MED ORDER — GABAPENTIN 300 MG PO CAPS
300.0000 mg | ORAL_CAPSULE | Freq: Two times a day (BID) | ORAL | Status: DC
  Administered 2017-10-13 – 2017-10-20 (×14): 300 mg via ORAL
  Filled 2017-10-13 (×14): qty 1

## 2017-10-13 MED ORDER — SODIUM CHLORIDE 0.9 % IV SOLN
1.0000 g | Freq: Two times a day (BID) | INTRAVENOUS | Status: DC
  Administered 2017-10-13 – 2017-10-20 (×14): 1 g via INTRAVENOUS
  Filled 2017-10-13 (×17): qty 1

## 2017-10-13 MED ORDER — SODIUM CHLORIDE 0.9% FLUSH: 3.0000 mL | INTRAVENOUS | Status: DC | PRN

## 2017-10-13 MED ORDER — ADULT MULTIVITAMIN W/MINERALS CH
1.0000 | ORAL_TABLET | Freq: Every day | ORAL | Status: DC
Start: 2017-10-14 — End: 2017-10-20
  Administered 2017-10-14 – 2017-10-20 (×7): 1 via ORAL
  Filled 2017-10-13 (×7): qty 1

## 2017-10-13 MED ORDER — LATANOPROST 0.005 % OP SOLN
1.0000 [drp] | Freq: Every day | OPHTHALMIC | Status: DC
Start: 2017-10-13 — End: 2017-10-20
  Administered 2017-10-13 – 2017-10-19 (×8): 1 [drp] via OPHTHALMIC
  Filled 2017-10-13: qty 2.5

## 2017-10-13 MED ORDER — FUROSEMIDE 10 MG/ML IJ SOLN
40.0000 mg | Freq: Once | INTRAMUSCULAR | Status: AC
  Administered 2017-10-13: 40 mg via INTRAVENOUS
  Filled 2017-10-13: qty 4

## 2017-10-13 MED ORDER — PANTOPRAZOLE SODIUM 40 MG PO TBEC
40.0000 mg | DELAYED_RELEASE_TABLET | Freq: Every day | ORAL | Status: DC
  Administered 2017-10-14 – 2017-10-17 (×4): 40 mg via ORAL
  Filled 2017-10-13 (×5): qty 1

## 2017-10-13 MED ORDER — VANCOMYCIN HCL 10 G IV SOLR
1500.0000 mg | INTRAVENOUS | Status: AC
  Administered 2017-10-13: 1500 mg via INTRAVENOUS
  Filled 2017-10-13: qty 1500

## 2017-10-13 MED ORDER — IPRATROPIUM-ALBUTEROL 0.5-2.5 (3) MG/3ML IN SOLN: 3.0000 mL | Freq: Four times a day (QID) | RESPIRATORY_TRACT | Status: DC | PRN

## 2017-10-13 MED ORDER — SODIUM CHLORIDE 0.9 % IV SOLN
250.0000 mL | INTRAVENOUS | Status: DC | PRN
  Administered 2017-10-13: 250 mL via INTRAVENOUS

## 2017-10-13 MED ORDER — SODIUM CHLORIDE 0.9% FLUSH
3.0000 mL | Freq: Two times a day (BID) | INTRAVENOUS | Status: DC
  Administered 2017-10-13 – 2017-10-20 (×5): 3 mL via INTRAVENOUS

## 2017-10-13 MED ORDER — HEPARIN (PORCINE) IN NACL 100-0.45 UNIT/ML-% IJ SOLN
1100.0000 [IU]/h | INTRAMUSCULAR | Status: DC
  Administered 2017-10-13: 1100 [IU]/h via INTRAVENOUS
  Filled 2017-10-13: qty 250

## 2017-10-13 MED ORDER — SENNOSIDES-DOCUSATE SODIUM 8.6-50 MG PO TABS
2.0000 | ORAL_TABLET | Freq: Every day | ORAL | Status: DC
Start: 2017-10-13 — End: 2017-10-20
  Administered 2017-10-14 – 2017-10-19 (×6): 2 via ORAL
  Filled 2017-10-13 (×7): qty 2

## 2017-10-13 MED ORDER — FERROUS SULFATE 325 (65 FE) MG PO TABS
325.0000 mg | ORAL_TABLET | Freq: Every day | ORAL | Status: DC
  Administered 2017-10-14 – 2017-10-20 (×7): 325 mg via ORAL
  Filled 2017-10-13 (×8): qty 1

## 2017-10-13 NOTE — Progress Notes (Signed)
PHARMACY NOTE:  ANTIMICROBIAL RENAL DOSAGE ADJUSTMENT  Current antimicrobial regimen includes a mismatch between antimicrobial dosage and estimated renal function.  As per policy approved by the Pharmacy & Therapeutics and Medical Executive Committees, the antimicrobial dosage will be adjusted accordingly.  Current antimicrobial dosage:  Cefepime 1gm IV q8h x 8 days  Indication: Pneumonia  Renal Function:  Estimated Creatinine Clearance: 47.9 mL/min (by C-G formula based on SCr of 1.07 mg/dL). []      On intermittent HD, scheduled: []      On CRRT    Antimicrobial dosage has been changed to:  Cefepime 1gm IV q12h x 8 days  Additional comments: Thank you for allowing pharmacy to be a part of this patient's care.  Everette Rank, [facility] 10/13/2017 5:51 PM

## 2017-10-13 NOTE — ED Triage Notes (Signed)
Transported by Charisse Klinefelter from [place], recently discharged from this facility for same. EMS transported pt on CPAP and was unable to obtain a oxygen saturation above 90%. AAO x4.

## 2017-10-13 NOTE — Progress Notes (Addendum)
ANTICOAGULATION CONSULT NOTE - Initial Consult  Pharmacy Consult for Heparin Indication: DVT  No Known Allergies  Patient Measurements:   Heparin Dosing Weight: actual weight  Vital Signs: Temp: 98.2 F (36.8 C) (04/30 1837) Temp Source: Oral (04/30 1837) BP: 114/52 (04/30 1837) Pulse Rate: 73 (04/30 1837)  Labs: Recent Labs    10/13/17 1433  HGB 9.8*  HCT 32.0*  PLT 138*  APTT 39*  LABPROT 16.1*  INR 1.30  CREATININE 1.07    Estimated Creatinine Clearance: 47.9 mL/min (by C-G formula based on SCr of 1.07 mg/dL).   Medical History: Past Medical History:  Diagnosis Date  . Allergy   . Arthritis   . Blood transfusion   . Candidiasis of the esophagus 07-17-2000   EGD  . Cataract    bil eyes  . Colon polyps 05-27-2002   Tubulovillous Adenoma-Colonoscopy  . Diverticulosis of colon (without mention of hemorrhage) 2008,2006,2003   Colonoscopy  . Esophageal reflux 2008   EGD  . Glaucoma   . Hiatal hernia 2002,2003,2013   EGD  . Hyperparathyroidism, unspecified ([place])   . Hypertension   . Internal hemorrhoids without mention of complication 8657   Colonoscopy  . Multiple falls    Pt falls asleep without warning and has encounter multiple falls  . Obstructive sleep apnea (adult) (pediatric)   . Other vitamin B12 deficiency anemia   . Pneumonia    years ago  . Restless legs syndrome (RLS)   . Stricture and stenosis of esophagus 05-27-2002   EGD  . Unspecified asthma(493.90)     Medications:  PTA:  Xarelto 20mg  daily - Last dose noted as 10/12/17 @ 08:43  Assessment:  84 yr male admitted with pleural effusion and pneumonia  PMH significant for LLE DVT and patient on Xarelto.  Upon admission, Xarelto held for possible thoracentesis and pharmacy consulted to dose IV heparin  Baseline INR = 1.3 (Xarelto falsely elevates INR)                      APTT = 39 sec                      Heparin level = 1.34 (falsely elevated due to effects of Xarelto on heparin  level)  Goal of Therapy:  Heparin level 0.3-0.7 units/ml aPTT 66-102 seconds Monitor platelets by anticoagulation protocol: Yes   Plan:   Heparin 3000 unit IV bolus x 1 followed by heparin @ 1100 units/hr  Check aPTT and heparin level 8 hr after heparin started  Due to Xarelto's effect to falsely elevate heparin level: will monitor aPTT along with heparin level until the two labs correlate, and then will monitor only with heparin levels  Follow daily CBC and aPTT/heparin levels while on heparin  Fern Canova, Toribio Harbour, PharmD 10/13/2017,7:01 PM

## 2017-10-13 NOTE — Clinical Social Work Note (Signed)
Clinical Social Work Assessment  Patient Details  Name: Derrick Sanchez MRN: [id number] Date of Birth: 05/22/1931  Date of referral:  10/13/17               Reason for consult:  Facility Placement                Permission sought to share information with:  Facility Art therapist granted to share information::  Yes, Verbal Permission Granted  Name::        Agency::     Relationship::     Contact Information:     Housing/Transportation Living arrangements for the past 2 months:  [place] of Information:  Adult Children Patient Interpreter Needed:  None Criminal Activity/Legal Involvement Pertinent to Current Situation/Hospitalization:    Significant Relationships:  Adult Children Lives with:  Facility Resident Do you feel safe going back to the place where you live?  No Need for family participation in patient care:  Yes (Comment)  Care giving concerns:  [name] at ph: [phone] stated pt was at [facility] and originally was to be short-term rehab, but thus far pt has not been D/C'd yet.  Pt/Pt's son's plan is for pt to return to [facility] then rehab until previous baseline and then return home at some point.     Social Worker assessment / plan:  CSW met with pt/pt's son and confirmed pt's/pt's son's plan for pt to be discharged back to [facility] for additional rehab and then at some point to return home to live.  CSW provided active listening and validated pt's son's concerns that CSW Dept remain flexible should this plan change.   CSW DEPT was given permission to complete appropriate FL-2 and referral to be sent back out to [facility] facility via the hub if and when needed.  Pt has been living independently prior to being admitted [facility] and then pt was sent to [facility]. Pt has had multiple hospital/ED visits since originally being sent to [facility].  Employment status:  Retired Forensic scientist:   Tax inspector) PT Recommendations:  Not assessed at this time Information / Referral to community resources:     Patient/Family's Response to care:  Patient alert and oriented, per chart but unable to speak due to oxygen mask and deferred to his son to speak for the pt.  Patient and *pt's son greeable to plan.  Pt's son supportive and strongly involved in pt.'s care.  Pt.'s son pleasant and appreciated CSW intervention.    Patient/Family's Understanding of and Emotional Response to Diagnosis, Current Treatment, and Prognosis:  Still assessing  Emotional Assessment Appearance:  Appears stated age Attitude/Demeanor/Rapport:    Affect (typically observed):  Unable to Assess Orientation:  (Unable to assess, pt had an oxygen mask on) Alcohol / Substance use:    Psych involvement (Current and /or in the community):     Discharge Needs  Concerns to be addressed:  No discharge needs identified Readmission within the last 30 days:  Yes Current discharge risk:  None Barriers to Discharge:  No Barriers Identified   Derrick Sanchez, LCSWA 10/13/2017, 5:29 PM

## 2017-10-13 NOTE — ED Notes (Signed)
ED Provider at bedside. 

## 2017-10-13 NOTE — ED Notes (Signed)
UNSUCCESSFUL CULTURE COLLECTION ATTEMPT IN RIGHT FOREARM

## 2017-10-13 NOTE — H&P (Signed)
History and Physical    Derrick Sanchez YQM:[id number] DOB: January 09, 1931 DOA: 10/13/2017  PCP: [name], MD  Patient coming from: SNF  I have personally briefly reviewed patient's old medical records in [place]  Chief Complaint:   HPI: Derrick Sanchez is a 84 y.o. male with medical history significant of for spinal epidural abscess, lumbar radiculopathy, paraparesis of lower limbs, chronic osteomyelitis of lumbar spine, frequent falls, malnutrition, failure to thrive who presented to ED [facility] from SNF with complaints of dyspnea and persistent productive cough.  Patient had recent admission and was treated for pneumonia as well as pleural effusion.  Presents with similar complaints  ED Course: Patient was found to have recurring left pleural effusion and possible pneumonia we are consulted for further evaluation and recommendations  Review of Systems: As per HPI otherwise 10 point review of systems negative.    Past Medical History:  Diagnosis Date  . Allergy   . Arthritis   . Blood transfusion   . Candidiasis of the esophagus 07-17-2000   EGD  . Cataract    bil eyes  . Colon polyps 05-27-2002   Tubulovillous Adenoma-Colonoscopy  . Diverticulosis of colon (without mention of hemorrhage) 2008,2006,2003   Colonoscopy  . Esophageal reflux 2008   EGD  . Glaucoma   . Hiatal hernia 2002,2003,2013   EGD  . Hyperparathyroidism, unspecified ([place])   . Hypertension   . Internal hemorrhoids without mention of complication 5284   Colonoscopy  . Multiple falls    Pt falls asleep without warning and has encounter multiple falls  . Obstructive sleep apnea (adult) (pediatric)   . Other vitamin B12 deficiency anemia   . Pneumonia    years ago  . Restless legs syndrome (RLS)   . Stricture and stenosis of esophagus 05-27-2002   EGD  . Unspecified asthma(493.90)     Past Surgical History:  Procedure Laterality Date  . arm surgery    . CATARACT EXTRACTION Bilateral   .  COLONOSCOPY    . EYE SURGERY Bilateral    cataracts iol  . HIP SURGERY Right 1998   replacement  . LUMBAR FUSION  x 3   most recent June 2016  . LUMBAR LAMINECTOMY/ DECOMPRESSION WITH MET-RX N/A 11/23/2014   Procedure: HARDWARE REMOVAL WITH MET-RX;  Surgeon: Jovita Gamma, MD;  Location: [place] [facility];  Service: Neurosurgery;  Laterality: N/A;  removal of lumbar posterior instrumentation  . LUMBAR WOUND DEBRIDEMENT N/A 03/12/2014   Procedure: LUMBAR WOUND DEBRIDEMENT;  Surgeon: Elaina Hoops, MD;  Location: [place] [facility]UR[facility];  Service: Neurosurgery;  Laterality: N/A;  LUMBAR WOUND DEBRIDEMENT  . MASS EXCISION  10/14/2011   Procedure: EXCISION MASS;  Surgeon: Wynonia Sours, MD;  Location: [place];  Service: Orthopedics;  Laterality: Left;   left hand  . PARATHYROIDECTOMY  2008  . PICC LINE PLACE PERIPHERAL ([place] HX) Left   . TOTAL KNEE ARTHROPLASTY Left 1998   replacement     reports that he has never smoked. He has never used smokeless tobacco. He reports that he does not drink alcohol or use drugs.  No Known Allergies  Family History  Problem Relation Age of Onset  . Heart disease Father   . Brain cancer Unknown        3 brothers  . Heart disease Sister   . Leukemia Brother   . Colon cancer Neg Hx   . Esophageal cancer Neg Hx   . Rectal cancer Neg Hx   .  Stomach cancer Neg Hx   . Diabetes Neg Hx   . Kidney disease Neg Hx   . Liver disease Neg Hx     Prior to Admission medications   Medication Sig Start Date End Date Taking? Authorizing Provider  B Complex-C (B-COMPLEX WITH VITAMIN C) tablet Take 1 tablet by mouth daily.   Yes [provider]  bimatoprost (LUMIGAN) 0.01 % SOLN Place 1 drop into both eyes at bedtime.   Yes [provider]  Collagenase (SANTYL EX) Apply 1 application topically daily.   Yes [provider]  esomeprazole (NEXIUM) 40 MG capsule Take 40 mg by mouth daily.  03/28/16  Yes Irene Shipper, MD  ferrous sulfate 325  (65 FE) MG tablet Take 1 tablet by mouth daily.   Yes [provider]  furosemide (LASIX) 20 MG tablet Take 1 tablet (20 mg total) by mouth 2 (two) times daily. 10/05/17  Yes Oswald Hillock, MD  gabapentin (NEURONTIN) 300 MG capsule Take 300-600 mg by mouth 2 (two) times daily. Take 1 capsules (300 mg) every morning, 2 capsules (600 mg) at bedtime   Yes [provider]  HYDROcodone-acetaminophen (NORCO/VICODIN) 5-325 MG tablet Take 1 tablet by mouth every 6 (six) hours as needed for severe pain. 10/05/17  Yes Lama, Marge Duncans, MD  hyoscyamine (LEVSIN, ANASPAZ) 0.125 MG tablet Take 0.125 mg by mouth once.   Yes [provider]  ipratropium-albuterol (DUONEB) 0.5-2.5 (3) MG/3ML SOLN Take 3 mLs by nebulization every 6 (six) hours as needed. 10/05/17  Yes Oswald Hillock, MD  levofloxacin (LEVAQUIN) 500 MG tablet Take 1 tablet (500 mg total) by mouth daily. 09/29/17  Yes Roxan Hockey, MD  Multiple Vitamin (MULTIVITAMIN WITH MINERALS) TABS tablet Take 1 tablet by mouth daily.   Yes [provider]  ondansetron (ZOFRAN) 4 MG tablet Take 1 tablet (4 mg total) by mouth every 6 (six) hours as needed for nausea. 09/29/17  Yes Emokpae, Courage, MD  pramipexole (MIRAPEX) 1 MG tablet Take 1 mg by mouth 3 (three) times daily.    Yes [provider]  predniSONE (DELTASONE) 10 MG tablet Prednisone 40 mg po daily x 3 day then Prednisone 30 mg po daily x 3 day then Prednisone 20 mg po daily x 3 day then Prednisone 10 mg daily x 2 day then stop... 10/05/17  Yes Lama, Marge Duncans, MD  senna-docusate (SENOKOT-S) 8.6-50 MG tablet Take 2 tablets by mouth at bedtime. 09/29/17 09/29/18 Yes Emokpae, Courage, MD  XARELTO 20 MG TABS tablet Take 20 mg by mouth daily. 08/26/17  Yes [provider]  lactobacillus (FLORANEX/LACTINEX) PACK Take 1 packet (1 g total) by mouth 3 (three) times daily with meals. Patient not taking: Reported on 10/13/2017 09/29/17   Roxan Hockey, MD  lactose free  nutrition (BOOST PLUS) LIQD Take 237 mLs by mouth 3 (three) times daily between meals. Patient not taking: Reported on 10/13/2017 09/29/17   Roxan Hockey, MD    Physical Exam: Vitals:   10/13/17 1500 10/13/17 1553 10/13/17 1630 10/13/17 1700  BP: 113/60 (!) 91/53 117/63 114/61  Pulse: (!) 59 62 63 61  Resp: _0 Temp:      SpO2: 92% 95% 95% 99%    Constitutional: NAD, calm, comfortable Vitals:   10/13/17 1500 10/13/17 1553 10/13/17 1630 10/13/17 1700  BP: 113/60 (!) 91/53 117/63 114/61  Pulse: (!) 59 62 63 61  Resp: _1 Temp:  SpO2: 92% 95% 95% 99%   Eyes: PERRL, lids and conjunctivae normal ENMT: Mucous membranes are moist. Posterior pharynx clear of any exudate or lesions. Neck: normal, supple, no masses, no thyromegaly Respiratory: decreased breath sounds at left base, equal chest rise. [place] in place Cardiovascular: Regular rate and rhythm, no murmurs / rubs / gallops. Abdomen: no tenderness, no masses palpated. No hepatosplenomegaly. Bowel sounds positive.  Musculoskeletal: no clubbing / cyanosis. Normal muscle tone Skin: no rashes, lesions, ulcers. No induration, on limited exam. Neurologic:  No facial asymmetry, sensation in tact, hearing impaired Psychiatric:  Normal mood and affect   Labs on Admission: I have personally reviewed following labs and imaging studies  CBC: Recent Labs  Lab 10/07/17 0532 10/13/17 1433  WBC 3.2* 9.3  NEUTROABS  --  8.0*  HGB 8.4* 9.8*  HCT 26.6* 32.0*  MCV 87.5 90.4  PLT 149* 779*   Basic Metabolic Panel: Recent Labs  Lab 10/07/17 0532 10/13/17 1433  NA 135 138  K 3.8 3.8  CL 100* 103  CO2 25 26  GLUCOSE 95 88  BUN 28* 29*  CREATININE 0.78 1.07  CALCIUM 7.5* 7.6*   GFR: Estimated Creatinine Clearance: 47.9 mL/min (by C-G formula based on SCr of 1.07 mg/dL). Liver Function Tests: No results for input(s): AST, ALT, ALKPHOS, BILITOT, PROT, ALBUMIN in the last 168 hours. No results for input(s):  LIPASE, AMYLASE in the last 168 hours. No results for input(s): AMMONIA in the last 168 hours. Coagulation Profile: Recent Labs  Lab 10/13/17 1433  INR 1.30   Cardiac Enzymes: No results for input(s): CKTOTAL, CKMB, CKMBINDEX, TROPONINI in the last 168 hours. BNP (last 3 results) No results for input(s): PROBNP in the last 8760 hours. HbA1C: No results for input(s): HGBA1C in the last 72 hours. CBG: No results for input(s): GLUCAP in the last 168 hours. Lipid Profile: No results for input(s): CHOL, HDL, LDLCALC, TRIG, CHOLHDL, LDLDIRECT in the last 72 hours. Thyroid Function Tests: No results for input(s): TSH, T4TOTAL, FREET4, T3FREE, THYROIDAB in the last 72 hours. Anemia Panel: No results for input(s): VITAMINB12, FOLATE, FERRITIN, TIBC, IRON, RETICCTPCT in the last 72 hours. Urine analysis:    Component Value Date/Time   COLORURINE YELLOW 10/06/2017 1150   APPEARANCEUR HAZY (A) 10/06/2017 1150   LABSPEC 1.014 10/06/2017 1150   PHURINE 7.0 10/06/2017 1150   GLUCOSEU NEGATIVE 10/06/2017 1150   HGBUR LARGE (A) 10/06/2017 1150   BILIRUBINUR NEGATIVE 10/06/2017 1150   KETONESUR NEGATIVE 10/06/2017 1150   PROTEINUR NEGATIVE 10/06/2017 1150   UROBILINOGEN 0.2 04/09/2007 1456   NITRITE NEGATIVE 10/06/2017 1150   LEUKOCYTESUR NEGATIVE 10/06/2017 1150    Radiological Exams on Admission: Dg Chest Port 1 View  Result Date: 10/13/2017 CLINICAL DATA:  84 y/o  M; shortness of breath, cough, wheeze. EXAM: PORTABLE CHEST 1 VIEW COMPARISON:  09/30/2017 chest radiograph FINDINGS: Increased large left and small right pleural effusions. Associated opacification of the lungs probably represents atelectasis. Pneumonia is possible. Cardiac silhouette obscured by the left-sided effusion. Aortic atherosclerosis. Stable background of chronic lung disease. Bones are demineralized and there are chronic rib fractures on the right. IMPRESSION: Increased large left and small right pleural effusions  with associated lung opacities probably representing atelectasis. Underlying pneumonia is possible. Aortic atherosclerosis. Electronically Signed   By: Kristine Garbe M.D.   On: 10/13/2017 14:46    EKG: Independently reviewed. Sinus rhythm with no st elevations or depressions  Assessment/Plan Active Problems:   Pleural effusion  Active Problems:  HCAP (healthcare-associated pneumonia) - Place on broad spectrum antibiotics.   Acute hypoxic respiratory failure secondary to suspected aspiration pneumonia versus Pleural effusion. - broad spectrum antibiotics - Pt has reaccumulated pleural effusion, will pace order for IR consult and hold anticoagulation and place on heparin.   Chronic diastolic heart failure with LVEF 58-72% Grade 1 diastolic dysfunction on 2D echo done on 09/22/2017 - stable. May be contributing to pleural effusion accumulation  Adult failure to thrive/severe physical debility/Review goals of care with suspicion for aspiration - ED physician was going to consult palliative.  - son would like palliative on board. If not done by ED consult in AM.  Chronic low back pain/history of chronic vertebral osteomyelitis - On chronic suppressive therapy with Levaquin, but will place on broad spectrum antibiotics as such will not continue levaquin for now.  History of left lower extremity DVT - On Xarelto at home but will place on heparin for possible thoracentesis   DVT prophylaxis: Heparin Code Status: full Family Communication: d/c son at bedside.  Disposition Plan: med surg Consults called: none Admission status: inpatient   Velvet Bathe MD [facility] Pager [phone]  If 7PM-7AM, please contact night-coverage [email or web address] Password [facility]  10/13/2017, 5:31 PM

## 2017-10-13 NOTE — Progress Notes (Signed)
Pharmacy Antibiotic Note  Derrick Sanchez is a 84 y.o. male admitted on 10/13/2017 with recurring left pleural effusion and pneumonia.  Recent hospitalization for pneumonia, and has been on Levaquin 500mg  po daily outpatient (last dose taken on 10/12/17).  Pharmacy has been consulted for Vancomycin dosing.  Plan:  Vancomycin 1500mg  IV x 1 dose followed by 1gm IV q24h (Goal AUC 400-500)  Cefepime per MD (dose adjusted per renal dosage protocol)  Follow renal function  Follow cultures/sensitivities    Temp (24hrs), Avg:97.4 F (36.3 C), Min:97.4 F (36.3 C), Max:97.4 F (36.3 C)  Recent Labs  Lab 10/07/17 0532 10/13/17 1433 10/13/17 1448  WBC 3.2* 9.3  --   CREATININE 0.78 1.07  --   LATICACIDVEN  --   --  1.46    Estimated Creatinine Clearance: 47.9 mL/min (by C-G formula based on SCr of 1.07 mg/dL).    No Known Allergies  Antimicrobials this admission: 4/30 Vanc >>   4/30 Cefepime >>    Dose adjustments this admission:    Microbiology results: 4/30 BCx: sent  Thank you for allowing pharmacy to be a part of this patient's care.  Everette Rank, PharmD 10/13/2017 5:52 PM

## 2017-10-13 NOTE — ED Provider Notes (Signed)
[facility] DEPT Provider Note   CSN: [id number] Arrival date & time: 10/13/17  1311     History   Chief Complaint Chief Complaint  Patient presents with  . Shortness of Breath    HPI Derrick Sanchez is a 84 y.o. male.  HPI Has medical history significant for aspiration pneumonia and recent admission for healthcare associated pneumonia.  Patient does have failure to thrive and significant baseline disability.  Patient reports that he was being fed today and he was having to eat too fast.  He does not remember if he choked on his food or not but started to get really short of breath while eating.  That has persisted.  He denies any chest pain.  Patient has significant physical disability with history of osteomyelitis and lower extremity weakness\paralysis.  He also has history of chronic recurrent right shoulder dislocation.  Although physically, patient appears that he would be uncomfortable in terms of positioning, he denies pain. Past Medical History:  Diagnosis Date  . Allergy   . Arthritis   . Blood transfusion   . Candidiasis of the esophagus 07-17-2000   EGD  . Cataract    bil eyes  . Colon polyps 05-27-2002   Tubulovillous Adenoma-Colonoscopy  . Diverticulosis of colon (without mention of hemorrhage) 2008,2006,2003   Colonoscopy  . Esophageal reflux 2008   EGD  . Glaucoma   . Hiatal hernia 2002,2003,2013   EGD  . Hyperparathyroidism, unspecified ([place])   . Hypertension   . Internal hemorrhoids without mention of complication 3329   Colonoscopy  . Multiple falls    Pt falls asleep without warning and has encounter multiple falls  . Obstructive sleep apnea (adult) (pediatric)   . Other vitamin B12 deficiency anemia   . Pneumonia    years ago  . Restless legs syndrome (RLS)   . Stricture and stenosis of esophagus 05-27-2002   EGD  . Unspecified asthma(493.90)     Patient Active Problem List   Diagnosis Date Noted  . Dysphagia     . HCAP (healthcare-associated pneumonia) 09/30/2017  . Hypoxia   . At risk for aspiration   . Pressure injury of skin 09/22/2017  . Multiple closed fractures of ribs of right side   . Pleural effusion   . Palliative care by specialist   . CAP (community acquired pneumonia) 09/20/2017  . Acute on chronic respiratory failure with hypoxia ([place]) 09/20/2017  . Malnutrition ([place]) 09/20/2017  . FTT (failure to thrive) in adult 09/20/2017  . PVD (peripheral vascular disease) ([place]) 08/04/2016  . Goals of care, counseling/discussion 08/04/2016  . Urinary hesitancy 11/26/2015  . Essential hypertension 01/05/2015  . Lumbar radiculopathy 11/23/2014  . Spinal epidural abscess 03/12/2014  . Protein-calorie malnutrition, severe ([place]) 03/01/2014  . Frequent falls 02/27/2014  . Trochanteric fracture ([place]) 02/27/2014  . Lumbar stenosis with neurogenic claudication 02/06/2014  . Stiffness of joints, not elsewhere classified, multiple sites 12/22/2012  . Postural imbalance 12/22/2012  . Hx of falling 12/22/2012  . HYPERPARATHYROIDISM UNSPECIFIED 05/03/2010  . OBSTRUCTIVE SLEEP APNEA 05/03/2010  . GLAUCOMA 05/03/2010  . ASTHMA 05/03/2010  . ANEMIA, VITAMIN B12 DEFICIENCY 06/14/2007  . RESTLESS LEG SYNDROME 06/14/2007  . GASTROESOPHAGEAL REFLUX DISEASE 06/14/2007  . DIVERTICULOSIS, COLON 05/31/2007  . POLYP, COLON 05/27/2002    Past Surgical History:  Procedure Laterality Date  . arm surgery    . CATARACT EXTRACTION Bilateral   . COLONOSCOPY    . EYE SURGERY Bilateral    cataracts iol  .  HIP SURGERY Right 1998   replacement  . LUMBAR FUSION  x 3   most recent June 2016  . LUMBAR LAMINECTOMY/ DECOMPRESSION WITH MET-RX N/A 11/23/2014   Procedure: HARDWARE REMOVAL WITH MET-RX;  Surgeon: Jovita Gamma, MD;  Location: [facility];  Service: Neurosurgery;  Laterality: N/A;  removal of lumbar posterior instrumentation  . LUMBAR WOUND DEBRIDEMENT N/A 03/12/2014   Procedure: LUMBAR WOUND  DEBRIDEMENT;  Surgeon: Elaina Hoops, MD;  Location: [facility];  Service: Neurosurgery;  Laterality: N/A;  LUMBAR WOUND DEBRIDEMENT  . MASS EXCISION  10/14/2011   Procedure: EXCISION MASS;  Surgeon: Wynonia Sours, MD;  Location: [facility];  Service: Orthopedics;  Laterality: Left;   left hand  . PARATHYROIDECTOMY  2008  . PICC LINE PLACE PERIPHERAL ([place] HX) Left   . TOTAL KNEE ARTHROPLASTY Left 1998   replacement        Home Medications    Prior to Admission medications   Medication Sig Start Date End Date Taking? Authorizing Provider  B Complex-C (B-COMPLEX WITH VITAMIN C) tablet Take 1 tablet by mouth daily.    [provider]  bimatoprost (LUMIGAN) 0.01 % SOLN Place 1 drop into both eyes at bedtime.    [provider]  esomeprazole (NEXIUM) 40 MG capsule Take 40 mg by mouth daily.  03/28/16   Irene Shipper, MD  ferrous sulfate 325 (65 FE) MG tablet Take 1 tablet by mouth daily.    [provider]  furosemide (LASIX) 20 MG tablet Take 1 tablet (20 mg total) by mouth 2 (two) times daily. 10/05/17   Oswald Hillock, MD  gabapentin (NEURONTIN) 300 MG capsule Take 300-600 mg by mouth 3 (three) times daily. Take 2 capsules (600 mg) every morning, 1 capsule (300 mg) at noon, 2 capsules (600 mg) at bedtime    [provider]  guaiFENesin (ROBITUSSIN) 100 MG/5ML SOLN Take 10 mLs by mouth 2 (two) times daily.    [provider]  HYDROcodone-acetaminophen (NORCO/VICODIN) 5-325 MG tablet Take 1 tablet by mouth every 6 (six) hours as needed for severe pain. 10/05/17   Oswald Hillock, MD  ipratropium-albuterol (DUONEB) 0.5-2.5 (3) MG/3ML SOLN Take 3 mLs by nebulization every 6 (six) hours as needed. 10/05/17   Oswald Hillock, MD  lactobacillus (FLORANEX/LACTINEX) PACK Take 1 packet (1 g total) by mouth 3 (three) times daily with meals. 09/29/17   Roxan Hockey, MD  lactose free nutrition (BOOST PLUS) LIQD Take 237 mLs by mouth 3 (three) times  daily between meals. 09/29/17   Roxan Hockey, MD  levofloxacin (LEVAQUIN) 500 MG tablet Take 1 tablet (500 mg total) by mouth daily. 09/29/17   Roxan Hockey, MD  ondansetron (ZOFRAN) 4 MG tablet Take 1 tablet (4 mg total) by mouth every 6 (six) hours as needed for nausea. 09/29/17   Roxan Hockey, MD  pramipexole (MIRAPEX) 1 MG tablet Take 1 mg by mouth 3 (three) times daily.     [provider]  predniSONE (DELTASONE) 10 MG tablet Prednisone 40 mg po daily x 3 day then Prednisone 30 mg po daily x 3 day then Prednisone 20 mg po daily x 3 day then Prednisone 10 mg daily x 2 day then stop... 10/05/17   Oswald Hillock, MD  senna-docusate (SENOKOT-S) 8.6-50 MG tablet Take 2 tablets by mouth at bedtime. 09/29/17 09/29/18  Emokpae, Courage, MD  XARELTO 20 MG TABS tablet Take 20 mg by mouth daily. 08/26/17   [provider]    Family History Family History  Problem Relation Age of Onset  . Heart disease Father   . Brain cancer Unknown        3 brothers  . Heart disease Sister   . Leukemia Brother   . Colon cancer Neg Hx   . Esophageal cancer Neg Hx   . Rectal cancer Neg Hx   . Stomach cancer Neg Hx   . Diabetes Neg Hx   . Kidney disease Neg Hx   . Liver disease Neg Hx     Social History Social History   Tobacco Use  . Smoking status: Never Smoker  . Smokeless tobacco: Never Used  Substance Use Topics  . Alcohol use: No    Alcohol/week: 0.0 oz  . Drug use: No     Allergies   Patient has no known allergies.   Review of Systems Review of Systems 10 Systems reviewed and are negative for acute change except as noted in the HPI.   Physical Exam Updated Vital Signs BP 113/60   Pulse (!) 59   Temp (!) 97.4 F (36.3 C)   Resp 17   SpO2 92%   Physical Exam  Constitutional:  Patient is very debilitated in appearance.  He is however alert and answers questions appropriately.  Mental status is clear.  Moderate increased work of breathing.  HENT:    Mouth/Throat: Oropharynx is clear and moist.  Eyes: EOM are normal.  Cardiovascular: Normal rate, regular rhythm, normal heart sounds and intact distal pulses.  Pulmonary/Chest:  Moderate increased work of breathing.  Patient does have wet cough.  Scattered rhonchi and transmitted breath sounds to the right side.  She is very diminished on the left.  Occasional expiratory wheeze.  Abdominal: Soft. He exhibits no distension. There is no tenderness.  Musculoskeletal:  Patient is nonambulatory thus both lower extremities have some flexion contracture.  Diffuse gray-blue discoloration of skin of both lower legs and feet.  Trace to 1+ edema both lower extremities.  Upper extremity has chronic shoulder dislocation on the right.  Patient also has some apparent edema of the upper extremities and extensive purplish ecchymotic discoloration.  Severe arthritis of both hands.  Neurological:  Patient's mental status is clear.  Cognitive function is intact.  Patient does assist in repositioning but is generally quite weak.  He will use both upper extremities to try to move himself but is very limited due to physical musculoskeletal disability.  Skin: Skin is warm and dry.     ED Treatments / Results  Labs (all labs ordered are listed, but only abnormal results are displayed) Labs Reviewed  BASIC METABOLIC PANEL - Abnormal; Notable for the following components:      Result Value   BUN 29 (*)    Calcium 7.6 (*)    All other components within normal limits  CBC WITH DIFFERENTIAL/PLATELET - Abnormal; Notable for the following components:   RBC 3.54 (*)    Hemoglobin 9.8 (*)    HCT 32.0 (*)    RDW 19.5 (*)    Platelets 138 (*)    Neutro Abs 8.0 (*)    All other components within normal limits  PROTIME-INR - Abnormal; Notable for the following components:   Prothrombin Time 16.1 (*)    All other components within normal limits  CULTURE, BLOOD (ROUTINE X 2)  CULTURE, BLOOD (ROUTINE X 2)  BRAIN  NATRIURETIC PEPTIDE  URINALYSIS, ROUTINE W REFLEX MICROSCOPIC  I-STAT CG4 LACTIC ACID, ED  I-STAT TROPONIN, ED    EKG None  Radiology Dg Chest Port 1 View  Result Date: 10/13/2017 CLINICAL DATA:  84 y/o  M; shortness of breath, cough, wheeze. EXAM: PORTABLE CHEST 1 VIEW COMPARISON:  09/30/2017 chest radiograph FINDINGS: Increased large left and small right pleural effusions. Associated opacification of the lungs probably represents atelectasis. Pneumonia is possible. Cardiac silhouette obscured by the left-sided effusion. Aortic atherosclerosis. Stable background of chronic lung disease. Bones are demineralized and there are chronic rib fractures on the right. IMPRESSION: Increased large left and small right pleural effusions with associated lung opacities probably representing atelectasis. Underlying pneumonia is possible. Aortic atherosclerosis. Electronically Signed   By: Kristine Garbe M.D.   On: 10/13/2017 14:46    Procedures Procedures (including critical care time) CRITICAL CARE Performed by: Si Gaul   Total critical care time: 30 minutes  Critical care time was exclusive of separately billable procedures and treating other patients.  Critical care was necessary to treat or prevent imminent or life-threatening deterioration.  Critical care was time spent personally by me on the following activities: development of treatment plan with patient and/or surrogate as well as nursing, discussions with consultants, evaluation of patient's response to treatment, examination of patient, obtaining history from patient or surrogate, ordering and performing treatments and interventions, ordering and review of laboratory studies, ordering and review of radiographic studies, pulse oximetry and re-evaluation of patient's condition. Medications Ordered in ED Medications  ipratropium-albuterol (DUONEB) 0.5-2.5 (3) MG/3ML nebulizer solution 3 mL (has no administration in time  range)     Initial Impression / Assessment and Plan / ED Course  I have reviewed the triage vital signs and the nursing notes.  Pertinent labs & imaging results that were available during my care of the patient were reviewed by me and considered in my medical decision making (see chart for details).     I did have long conversation with the patient's son.  At this time they are interested in care that will promote the patient's comfort and improve quality of life.  Patient's mental status has remained clear.  He does not wish chest compressions or intubation.  They are interested in consultation with palliative care.  Consult: Tried hospitalist for admission.  Final Clinical Impressions(s) / ED Diagnoses   Final diagnoses:  Hypoxia  Pleural effusion on left  Severe comorbid illness  Patient presents with recurrence of severe shortness of breath.  Patient is quite hypoxic.  He is requiring nonrebreather to maintain oxygen saturations to mid 80s up to 90.  Per EMR at discharge from last admission was using oxygen at 2 to 3 L nasal cannula.  Patient states having eaten too fast and too large amounts of suddenly becoming short of breath.  At this time however I doubt this is a sudden onset of aspiration pneumonia.  There is no signs of larger airway obstruction.  Chest x-ray shows pleural effusion.  Patient has prior pleural effusion.  I suspect reaccumulation or possibly persistent pneumonia.  Pulmonary embolus is within the differential although patient denies any chest pain and blood pressure is stable.  Plan will be for admission to hospitalist service for definitive management.  ED Discharge Orders    None       Charlesetta Shanks, MD 10/13/17 [phone]

## 2017-10-13 NOTE — ED Notes (Signed)
Bed: WA12 Expected date:  Expected time:  Means of arrival:  Comments: EMS-SOB 

## 2017-10-14 ENCOUNTER — Inpatient Hospital Stay (HOSPITAL_COMMUNITY): Payer: PPO

## 2017-10-14 DIAGNOSIS — R1310 Dysphagia, unspecified: Secondary | ICD-10-CM

## 2017-10-14 DIAGNOSIS — Z515 Encounter for palliative care: Secondary | ICD-10-CM

## 2017-10-14 DIAGNOSIS — J90 Pleural effusion, not elsewhere classified: Secondary | ICD-10-CM

## 2017-10-14 DIAGNOSIS — K219 Gastro-esophageal reflux disease without esophagitis: Secondary | ICD-10-CM

## 2017-10-14 DIAGNOSIS — E43 Unspecified severe protein-calorie malnutrition: Secondary | ICD-10-CM

## 2017-10-14 DIAGNOSIS — I10 Essential (primary) hypertension: Secondary | ICD-10-CM

## 2017-10-14 DIAGNOSIS — L89150 Pressure ulcer of sacral region, unstageable: Secondary | ICD-10-CM

## 2017-10-14 DIAGNOSIS — Z7189 Other specified counseling: Secondary | ICD-10-CM

## 2017-10-14 DIAGNOSIS — I82409 Acute embolism and thrombosis of unspecified deep veins of unspecified lower extremity: Secondary | ICD-10-CM

## 2017-10-14 DIAGNOSIS — Z9189 Other specified personal risk factors, not elsewhere classified: Secondary | ICD-10-CM

## 2017-10-14 DIAGNOSIS — R7881 Bacteremia: Secondary | ICD-10-CM | POA: Diagnosis present

## 2017-10-14 LAB — COMPREHENSIVE METABOLIC PANEL
ALBUMIN: 1.6 g/dL — AB (ref 3.5–5.0)
ALK PHOS: 68 U/L (ref 38–126)
ALT: 17 U/L (ref 17–63)
AST: 25 U/L (ref 15–41)
Anion gap: 9 (ref 5–15)
BUN: 26 mg/dL — ABNORMAL HIGH (ref 6–20)
CALCIUM: 7.2 mg/dL — AB (ref 8.9–10.3)
CO2: 24 mmol/L (ref 22–32)
CREATININE: 1 mg/dL (ref 0.61–1.24)
Chloride: 106 mmol/L (ref 101–111)
GFR calc Af Amer: >60 mL/min (ref >60)
GFR calc non Af Amer: >60 mL/min (ref >60)
GLUCOSE: 124 mg/dL — AB (ref 65–99)
Potassium: 3 mmol/L — ABNORMAL LOW (ref 3.5–5.1)
SODIUM: 139 mmol/L (ref 135–145)
Total Bilirubin: 0.5 mg/dL (ref 0.3–1.2)
Total Protein: 5.1 g/dL — ABNORMAL LOW (ref 6.5–8.1)

## 2017-10-14 LAB — IRON AND TIBC
Iron: 8 ug/dL — ABNORMAL LOW (ref 45–182)
Saturation Ratios: 6 % — ABNORMAL LOW (ref 17.9–39.5)
TIBC: 137 ug/dL — AB (ref 250–450)
UIBC: 129 ug/dL

## 2017-10-14 LAB — HIV ANTIBODY (ROUTINE TESTING W REFLEX): HIV Screen 4th Generation wRfx: NONREACTIVE

## 2017-10-14 LAB — CBC
HCT: 26.7 % — ABNORMAL LOW (ref 39.0–52.0)
HEMOGLOBIN: 8.6 g/dL — AB (ref 13.0–17.0)
MCH: 28.6 pg (ref 26.0–34.0)
MCHC: 32.2 g/dL (ref 30.0–36.0)
MCV: 88.7 fL (ref 78.0–100.0)
Platelets: 123 10*3/uL — ABNORMAL LOW (ref 150–400)
RBC: 3.01 MIL/uL — AB (ref 4.22–5.81)
RDW: 19.6 % — ABNORMAL HIGH (ref 11.5–15.5)
WBC: 10.5 10*3/uL (ref 4.0–10.5)

## 2017-10-14 LAB — LACTATE DEHYDROGENASE, PLEURAL OR PERITONEAL FLUID: LD FL: 93 U/L — AB (ref 3–23)

## 2017-10-14 LAB — BODY FLUID CELL COUNT WITH DIFFERENTIAL
Eos, Fluid: 0 %
Lymphs, Fluid: 5 %
Monocyte-Macrophage-Serous Fluid: 19 % — ABNORMAL LOW (ref 50–90)
NEUTROPHIL FLUID: 76 % — AB (ref 0–25)
Total Nucleated Cell Count, Fluid: 2758 cu mm — ABNORMAL HIGH (ref 0–1000)

## 2017-10-14 LAB — BLOOD CULTURE ID PANEL (REFLEXED)
Acinetobacter baumannii: NOT DETECTED
CANDIDA PARAPSILOSIS: NOT DETECTED
CANDIDA TROPICALIS: NOT DETECTED
Candida albicans: NOT DETECTED
Candida glabrata: NOT DETECTED
Candida krusei: NOT DETECTED
ENTEROBACTERIACEAE SPECIES: NOT DETECTED
Enterobacter cloacae complex: NOT DETECTED
Enterococcus species: NOT DETECTED
Escherichia coli: NOT DETECTED
HAEMOPHILUS INFLUENZAE: NOT DETECTED
KLEBSIELLA OXYTOCA: NOT DETECTED
KLEBSIELLA PNEUMONIAE: NOT DETECTED
Listeria monocytogenes: NOT DETECTED
METHICILLIN RESISTANCE: DETECTED — AB
Neisseria meningitidis: NOT DETECTED
Proteus species: NOT DETECTED
Pseudomonas aeruginosa: NOT DETECTED
SERRATIA MARCESCENS: NOT DETECTED
STREPTOCOCCUS PYOGENES: NOT DETECTED
Staphylococcus aureus (BCID): NOT DETECTED
Staphylococcus species: DETECTED — AB
Streptococcus agalactiae: NOT DETECTED
Streptococcus pneumoniae: NOT DETECTED
Streptococcus species: NOT DETECTED

## 2017-10-14 LAB — ALBUMIN, PLEURAL OR PERITONEAL FLUID: Albumin, Fluid: <1 g/dL

## 2017-10-14 LAB — FERRITIN: Ferritin: 107 ng/mL (ref 24–336)

## 2017-10-14 LAB — APTT
APTT: 132 s — AB (ref 24–36)
aPTT: 86 seconds — ABNORMAL HIGH (ref 24–36)

## 2017-10-14 LAB — FOLATE: FOLATE: 22.4 ng/mL (ref >5.9)

## 2017-10-14 LAB — GLUCOSE, PLEURAL OR PERITONEAL FLUID: Glucose, Fluid: 95 mg/dL

## 2017-10-14 LAB — PROTEIN, PLEURAL OR PERITONEAL FLUID: Total protein, fluid: <3 g/dL

## 2017-10-14 LAB — RETICULOCYTES
RBC.: 3.09 MIL/uL — ABNORMAL LOW (ref 4.22–5.81)
Retic Count, Absolute: 77.3 10*3/uL (ref 19.0–186.0)
Retic Ct Pct: 2.5 % (ref 0.4–3.1)

## 2017-10-14 LAB — STREP PNEUMONIAE URINARY ANTIGEN: STREP PNEUMO URINARY ANTIGEN: NEGATIVE

## 2017-10-14 LAB — VITAMIN B12: VITAMIN B 12: 783 pg/mL (ref 180–914)

## 2017-10-14 LAB — LACTATE DEHYDROGENASE: LDH: 127 U/L (ref 98–192)

## 2017-10-14 LAB — GLUCOSE, CAPILLARY: Glucose-Capillary: 81 mg/dL (ref 65–99)

## 2017-10-14 LAB — HEPARIN LEVEL (UNFRACTIONATED): Heparin Unfractionated: 0.93 IU/mL — ABNORMAL HIGH (ref 0.30–0.70)

## 2017-10-14 LAB — MAGNESIUM: Magnesium: 1.7 mg/dL (ref 1.7–2.4)

## 2017-10-14 MED ORDER — POTASSIUM CHLORIDE 20 MEQ PO PACK
40.0000 meq | PACK | ORAL | Status: AC
  Administered 2017-10-14 (×2): 40 meq via ORAL
  Filled 2017-10-14 (×2): qty 2

## 2017-10-14 MED ORDER — HEPARIN (PORCINE) IN NACL 100-0.45 UNIT/ML-% IJ SOLN
900.0000 [IU]/h | INTRAMUSCULAR | Status: DC
  Administered 2017-10-14: 900 [IU]/h via INTRAVENOUS
  Filled 2017-10-14 (×3): qty 250

## 2017-10-14 MED ORDER — GUAIFENESIN ER 600 MG PO TB12
1200.0000 mg | ORAL_TABLET | Freq: Two times a day (BID) | ORAL | Status: DC
  Administered 2017-10-14 – 2017-10-20 (×12): 1200 mg via ORAL
  Filled 2017-10-14 (×12): qty 2

## 2017-10-14 MED ORDER — IPRATROPIUM-ALBUTEROL 0.5-2.5 (3) MG/3ML IN SOLN
3.0000 mL | Freq: Three times a day (TID) | RESPIRATORY_TRACT | Status: DC
  Administered 2017-10-14 – 2017-10-20 (×16): 3 mL via RESPIRATORY_TRACT
  Filled 2017-10-14 (×17): qty 3

## 2017-10-14 MED ORDER — IPRATROPIUM-ALBUTEROL 0.5-2.5 (3) MG/3ML IN SOLN: 3.0000 mL | RESPIRATORY_TRACT | Status: DC | PRN

## 2017-10-14 MED ORDER — BUDESONIDE 0.5 MG/2ML IN SUSP
0.5000 mg | Freq: Two times a day (BID) | RESPIRATORY_TRACT | Status: DC
  Administered 2017-10-14 – 2017-10-20 (×11): 0.5 mg via RESPIRATORY_TRACT
  Filled 2017-10-14 (×11): qty 2

## 2017-10-14 MED ORDER — SILVER SULFADIAZINE 1 % EX CREA
TOPICAL_CREAM | Freq: Every day | CUTANEOUS | Status: DC
  Administered 2017-10-14 – 2017-10-19 (×6): via TOPICAL
  Filled 2017-10-14: qty 50

## 2017-10-14 MED ORDER — LORATADINE 10 MG PO TABS
10.0000 mg | ORAL_TABLET | Freq: Every day | ORAL | Status: DC
  Administered 2017-10-14 – 2017-10-20 (×7): 10 mg via ORAL
  Filled 2017-10-14 (×7): qty 1

## 2017-10-14 MED ORDER — SODIUM CHLORIDE 0.9 % IV BOLUS
1000.0000 mL | Freq: Once | INTRAVENOUS | Status: AC
  Administered 2017-10-14: 500 mL via INTRAVENOUS

## 2017-10-14 MED ORDER — MAGNESIUM SULFATE 4 GM/100ML IV SOLN
4.0000 g | Freq: Once | INTRAVENOUS | Status: AC
  Administered 2017-10-14: 4 g via INTRAVENOUS
  Filled 2017-10-14: qty 100

## 2017-10-14 MED ORDER — NYSTATIN 100000 UNIT/GM EX POWD
Freq: Three times a day (TID) | CUTANEOUS | Status: DC
Start: 2017-10-14 — End: 2017-10-20
  Administered 2017-10-14 – 2017-10-20 (×17): via TOPICAL
  Filled 2017-10-14: qty 15

## 2017-10-14 MED ORDER — LIDOCAINE HCL 1 % IJ SOLN
INTRAMUSCULAR | Status: AC
  Filled 2017-10-14: qty 20

## 2017-10-14 MED ORDER — SODIUM CHLORIDE 0.9 % IV SOLN
INTRAVENOUS | Status: DC
  Administered 2017-10-14 – 2017-10-15 (×2): via INTRAVENOUS

## 2017-10-14 MED ORDER — ENSURE ENLIVE PO LIQD
237.0000 mL | Freq: Two times a day (BID) | ORAL | Status: DC
  Administered 2017-10-15 – 2017-10-19 (×8): 237 mL via ORAL

## 2017-10-14 MED ORDER — FLUTICASONE PROPIONATE 50 MCG/ACT NA SUSP
2.0000 | Freq: Every day | NASAL | Status: DC
  Administered 2017-10-14 – 2017-10-20 (×6): 2 via NASAL
  Filled 2017-10-14: qty 16

## 2017-10-14 NOTE — Progress Notes (Addendum)
Daily Progress Note   Patient Name: Derrick Sanchez       Date: 10/14/2017 DOB: [birth year]  Age: 84 y.o. MRN#: [id number] Attending Physician: Eugenie Filler, MD Primary Care Physician: [name], MD Admit Date: 10/13/2017  Reason for Consultation/Follow-up: Establishing goals of care  Subjective: Patient awake, alert, oriented. Hard of hearing. No s/s of distressing. Eating dinner with assist from niece at bedside.   GOC:  This NP has met with son, Derrick Sanchez, during previous admissions. He is familiar with role of palliative medicine. Derrick Sanchez requests we speak in conference room.   Discussed at length hospital diagnoses, interventions, and underlying co-morbidities.   Patient was discharged to SNF with hospice services. Hospice representative was present during acute aspiration event yesterday at nursing facility. Derrick Sanchez speaks of his concern with not understanding the hospice philosophy and feeling they will "continue to give medications, and give medications, until he is dead."   We discussed continued hospitalization secondary to aspiration, heart failure, and recurrent pleural effusions. Explained high risk for continued aspiration and desaturation leading to respiratory distress and the challenges with keeping his father out of the hospital. Also baseline deconditioning secondary to osteomyelitis/discitis.   Derrick Sanchez has a good grasp of diagnoses, interventions, guarded prognosis, and high risk for recurrent aspiration/hospitalization. He seems to understand hospice is the "best" option to aid with symptom management and ensure his father's comfort. Derrick Sanchez struggles with the fact that his father bounces back and remains awake, alert, oriented, and has the "will power" to live. "He wants to live."   I  asked Derrick Sanchez how much is father understands of his current health status. Derrick Sanchez believes he knows he is sick "he has to know" but does not want to speak negatively ("he's toast") in fear of affecting his will to live.   Derrick Sanchez wishes to have a good plan to keep him home with is wife and 24/7 caregivers. He is open to re-initiating hospice services but also feels he should be able to bring his father back to the hospital in an acute event, such as yesterday. He struggles with the thought of truly focusing on "comfort" and not "treating" his father if necessary.   Derrick Sanchez speaks of NOT wanting his father to die in a nursing home alone at night. It is most important to try and  get Derrick Sanchez home with support of caregivers. I encouraged Derrick Sanchez to envision his wishes for his father at EOL--whether that is home with the support of hospice services and family or at the hospital with aggressive medical interventions.   Derrick Sanchez asks about [employer]. I explained the true focus on comfort, quality, and dignity at EOL and if his father was nearing EOL they would ensure comfort with symptom management instead of escalating care/bring back to the hospital. Derrick Sanchez is not interested in hospice facility at this time.   Derrick Sanchez requests to speak with hospice liaison this admission. He is hopeful to have a meeting with hospice liaison and PMT provider if possible. CM consult placed.   Discussed code status and his previous wishes for limited code. Derrick Sanchez requests NO resuscitation/life support but yes to code blue, BiPAP, ACLS meds, and or defibrillation/cardioversion if indicated.   Answered many questions and concerns. Therapeutic listening and emotional support provided.    Length of Stay: 1  Current Medications: Scheduled Meds:  . ferrous sulfate  325 mg Oral Daily  . furosemide  20 mg Oral BID  . gabapentin  300 mg Oral BID  . ipratropium-albuterol  3 mL Nebulization Once  . latanoprost  1 drop Both Eyes QHS  . lidocaine       . multivitamin with minerals  1 tablet Oral Daily  . pantoprazole  40 mg Oral Daily  . pramipexole  1 mg Oral TID  . senna-docusate  2 tablet Oral QHS  . silver sulfADIAZINE   Topical Daily  . sodium chloride flush  3 mL Intravenous Q12H    Continuous Infusions: . sodium chloride 250 mL (10/13/17 1954)  . ceFEPime (MAXIPIME) IV Stopped (10/14/17 1904)  . heparin 900 Units/hr (10/14/17 1421)  . vancomycin      PRN Meds: sodium chloride, ipratropium-albuterol, sodium chloride flush  Physical Exam  Constitutional: He is oriented to person, place, and time. He is cooperative.  HENT:  Head: Normocephalic and atraumatic.  Pulmonary/Chest: No accessory muscle usage. No tachypnea. No respiratory distress.  Neurological: He is alert and oriented to person, place, and time.  Skin: Skin is warm and dry. There is pallor.  Psychiatric: He has a normal mood and affect. His speech is normal and behavior is normal.  Nursing note and vitals reviewed.          Vital Signs: BP (!) 105/51 (BP Location: Right Arm)   Pulse 62   Temp 97.9 F (36.6 C) (Oral)   Resp 19   Wt 74.4 kg (164 lb 0.4 oz)   SpO2 94%   BMI 21.06 kg/m  SpO2: SpO2: 94 % O2 Device: O2 Device: Nasal Cannula O2 Flow Rate: O2 Flow Rate (L/min): 3 L/min  Intake/output summary:   Intake/Output Summary (Last 24 hours) at 10/14/2017 2047 Last data filed at 10/14/2017 1500 Gross per 24 hour  Intake 650.15 ml  Output 650 ml  Net 0.15 ml   LBM: Last BM Date: 10/13/17 Baseline Weight: Weight: 74.4 kg (164 lb 0.4 oz) Most recent weight: Weight: 74.4 kg (164 lb 0.4 oz)       Palliative Assessment/Data: PPS 30%     Patient Active Problem List   Diagnosis Date Noted  . Dysphagia   . HCAP (healthcare-associated pneumonia) 09/30/2017  . Hypoxia   . At risk for aspiration   . Pressure injury of skin 09/22/2017  . Multiple closed fractures of ribs of right side   . Pleural effusion   .  Palliative care by specialist     . CAP (community acquired pneumonia) 09/20/2017  . Acute on chronic respiratory failure with hypoxia ([place]) 09/20/2017  . Malnutrition ([place]) 09/20/2017  . FTT (failure to thrive) in adult 09/20/2017  . PVD (peripheral vascular disease) ([place]) 08/04/2016  . Goals of care, counseling/discussion 08/04/2016  . Urinary hesitancy 11/26/2015  . Essential hypertension 01/05/2015  . Lumbar radiculopathy 11/23/2014  . Spinal epidural abscess 03/12/2014  . Protein-calorie malnutrition, severe ([place]) 03/01/2014  . Frequent falls 02/27/2014  . Trochanteric fracture ([place]) 02/27/2014  . Lumbar stenosis with neurogenic claudication 02/06/2014  . Stiffness of joints, not elsewhere classified, multiple sites 12/22/2012  . Postural imbalance 12/22/2012  . Hx of falling 12/22/2012  . HYPERPARATHYROIDISM UNSPECIFIED 05/03/2010  . OBSTRUCTIVE SLEEP APNEA 05/03/2010  . GLAUCOMA 05/03/2010  . ASTHMA 05/03/2010  . ANEMIA, VITAMIN B12 DEFICIENCY 06/14/2007  . RESTLESS LEG SYNDROME 06/14/2007  . GASTROESOPHAGEAL REFLUX DISEASE 06/14/2007  . DIVERTICULOSIS, COLON 05/31/2007  . POLYP, COLON 05/27/2002    Palliative Care Assessment & Plan   Patient Profile: 84 y.o. male  with past medical history of back surgery s/p complications with discitis and osteomyelitis requiring long-term antibiotics, dysphagia, falls, hiatal hernia, HTN, DVT, esophageal stricture, and asthmaadmitted on 4/7/2019with right chest pain, cough and congestion.The patient suffered from a fall when transferring from motorized scooter to bed. Visited with neurosurgeon who imaged his back. Per son, no mention of rib fractures at that time. Continued to have right-sided chest pain despite oxycodone. In ED, chest xray revealed multiple right rib fractures, right upper lobe pneumonia, and large left pleural effusion with midlung airspace opacities which could represent pneumonia. CT chest revealed moderate bilateral pleural effusions.  Patient  re-admitted on 09/30/17 from SNF with shortness of breath and cough. Chest xray in ED revealed bilateral infiltrates more prominent in RML and increased pulmonary vascularity. Hypoxic with oxygen saturations between 88-90% on 3L. Started on IV zosyn and vancomycin. Also with chronic diastolic heart failure with EF 60-65%. ECHO on 09/22/16 revealed Grade 1 diastolic dysfunction. Palliative medicine consultation for goals of care.   Patient again re-admitted on 4/30 from SNF with shortness of breath and cough. Patient found to have recurring left pleural effusion and possible pneumonia. Started on IV antibiotics. Thoracentesis 5/1 with 200cc removed.   Assessment: Acute respiratory failure Aspiration pneumonia High aspiration risk Dysphagia Acute on chronic diastolic heart failure Recurrent pleural effusion Hx of chronic osteomyelitis/discitis Deconditioning/adult failure to thrive  Recommendations/Plan:  Patient well known to PMT and was recently discharged to SNF with hospice.   Code status discussed in detail. Limited code--NO resuscitation and NO intubation/mechanical ventilation. YES call code blue, use CPAP/BiPAP, ACLS medications, and/or defibrillation/cardioversion if indicated.   Son struggles with concept of full comfort focused care since his father is awake, alert, and oriented. We have discussed on numerous occasions diagnoses, interventions, underlying co-morbidities, poor prognosis, high risk for decompensation/recurrent hospitalization, and palliative/hospice recommendation.   When medically stable, son wishes to take patient home with 24/7 caregivers. He is considering initiating hospice services again but wishes to further discuss hospice philosophy and plan of care with hospice liaison and PMT provider if possible. RN CM consult placed.   PMT will follow.   Code Status: Limited   Code Status Orders  (From admission, onward)        Start     Ordered   10/14/17 1711   Limited resuscitation (code)  Continuous    Question Answer Comment  In the event  of cardiac or respiratory ARREST: Initiate Code Blue, Call Rapid Response Yes   In the event of cardiac or respiratory ARREST: Perform CPR No   In the event of cardiac or respiratory ARREST: Perform Intubation/Mechanical Ventilation No   In the event of cardiac or respiratory ARREST: Use NIPPV/BiPAp only if indicated Yes   In the event of cardiac or respiratory ARREST: Administer ACLS medications if indicated Yes   In the event of cardiac or respiratory ARREST: Perform Defibrillation or Cardioversion if indicated Yes      10/14/17 1710    Code Status History    Date Active Date Inactive Code Status Order ID Comments User Context   10/13/2017 2212 10/14/2017 1710 Full Code [id number]  Velvet Bathe, MD Inpatient   10/13/2017 2142 10/13/2017 2212 Partial Code [id number]  Vertis Kelch, NP Inpatient   10/01/2017 1450 10/07/2017 2035 Partial Code [id number]  Basilio Cairo, NP Inpatient   10/01/2017 0845 10/01/2017 1450 Full Code [id number]  Oswald Hillock, MD Inpatient   10/01/2017 0507 10/01/2017 0845 DNR [id number]  Jani Gravel, MD Inpatient   09/30/2017 1508 10/01/2017 0507 Full Code [id number]  Kayleen Memos, DO ED   09/30/2017 1449 09/30/2017 1508 DNR [id number]  Kayleen Memos, DO ED   09/30/2017 1436 09/30/2017 1449 Full Code [id number]  Kayleen Memos, DO ED   09/29/2017 1418 09/29/2017 1848 DNR [id number]  Roxan Hockey, MD Inpatient   09/20/2017 1703 09/29/2017 1418 Full Code [id number]  Erline Hau, MD ED   11/23/2014 1053 11/24/2014 1257 Full Code [id number]  Jovita Gamma, MD Inpatient   10/16/2014 1014 10/17/2014 0330 Full Code [id number]  Marybelle Killings, MD [facility]   03/12/2014 2043 03/16/2014 1751 Full Code [id number]  Elaina Hoops, MD Inpatient   02/27/2014 1235 03/03/2014 2024 Full Code [id number]  Cristal Ford, DO Inpatient   02/06/2014 1931 02/08/2014 1419 Full Code [id number]  Hosie Spangle, MD Inpatient      Advance Directive Documentation     Most Recent Value  Type of Advance Directive  Out of facility DNR (pink MOST or yellow form)  Pre-existing out of facility DNR order (yellow form or pink MOST form)  Yellow form placed in chart (order not valid for inpatient use)  "MOST" Form in Place?  -       Prognosis:   Unable to determine: poor prognosis with recurrent aspiration pneumonia, high aspiration risk on dysphagia diet, chronic diastolic heart failure, recurrent pleural effusions, and underlying deconditioning secondary to chronic osteomyelitis/discitis. High risk for decompensation and recurrent hospitalization.   Discharge Planning:  To Be Determined Son considering home with 24/7 caregivers. Also considering initiating palliative or hospice services again on discharge.   Care plan was discussed with patient, niece, son, PMT multidisciplinary team  Thank you for allowing the Palliative Medicine Team to assist in the care of this patient.   Time In: 1600 Time Out: 1720 Total Time 45mn Prolonged Time Billed yes      Greater than 50%  of this time was spent counseling and coordinating care related to the above assessment and plan.  MIhor Dow FNP-C Palliative Medicine Team  Phone: [phone]: [phone] Please contact Palliative Medicine Team phone at [phone] questions and concerns.

## 2017-10-14 NOTE — Progress Notes (Signed)
PHARMACY - PHYSICIAN COMMUNICATION CRITICAL VALUE ALERT - BLOOD CULTURE IDENTIFICATION (BCID)  Derrick Sanchez is an 84 y.o. male who presented to [facility] on 10/13/2017 with a chief complaint of left pleural effusion and pneumonia   Name of physician (or Provider) Contacted: Dr. Grandville Silos  Current antibiotics: vancomycin and cefepime  Changes to prescribed antibiotics recommended:  none  Results for orders placed or performed during the hospital encounter of 10/13/17  Blood Culture ID Panel (Reflexed) (Collected: 10/13/2017  2:33 PM)  Result Value Ref Range   Enterococcus species NOT DETECTED NOT DETECTED   Listeria monocytogenes NOT DETECTED NOT DETECTED   Staphylococcus species DETECTED (A) NOT DETECTED   Staphylococcus aureus NOT DETECTED NOT DETECTED   Methicillin resistance DETECTED (A) NOT DETECTED   Streptococcus species NOT DETECTED NOT DETECTED   Streptococcus agalactiae NOT DETECTED NOT DETECTED   Streptococcus pneumoniae NOT DETECTED NOT DETECTED   Streptococcus pyogenes NOT DETECTED NOT DETECTED   Acinetobacter baumannii NOT DETECTED NOT DETECTED   Enterobacteriaceae species NOT DETECTED NOT DETECTED   Enterobacter cloacae complex NOT DETECTED NOT DETECTED   Escherichia coli NOT DETECTED NOT DETECTED   Klebsiella oxytoca NOT DETECTED NOT DETECTED   Klebsiella pneumoniae NOT DETECTED NOT DETECTED   Proteus species NOT DETECTED NOT DETECTED   Serratia marcescens NOT DETECTED NOT DETECTED   Haemophilus influenzae NOT DETECTED NOT DETECTED   Neisseria meningitidis NOT DETECTED NOT DETECTED   Pseudomonas aeruginosa NOT DETECTED NOT DETECTED   Candida albicans NOT DETECTED NOT DETECTED   Candida glabrata NOT DETECTED NOT DETECTED   Candida krusei NOT DETECTED NOT DETECTED   Candida parapsilosis NOT DETECTED NOT DETECTED   Candida tropicalis NOT DETECTED NOT DETECTED    Dolly Rias RPh 10/14/2017, 1:53 PM Pager [phone]

## 2017-10-14 NOTE — Progress Notes (Signed)
ANTICOAGULATION CONSULT NOTE - f/u Consult  Pharmacy Consult for Heparin Indication: DVT  No Known Allergies  Patient Measurements:   Heparin Dosing Weight: actual weight  Vital Signs: Temp: (P) 98.8 F (37.1 C) (05/01 0409) Temp Source: (P) Axillary (05/01 0409) BP: (P) 90/46 (05/01 0409) Pulse Rate: (P) 60 (05/01 0409)  Labs: Recent Labs    10/13/17 1433 10/14/17 0406  HGB 9.8* 8.6*  HCT 32.0* 26.7*  PLT 138* 123*  APTT 39*  --   LABPROT 16.1*  --   INR 1.30  --   HEPARINUNFRC 1.34* 0.93*  CREATININE 1.07  --     Estimated Creatinine Clearance: 47.9 mL/min (by C-G formula based on SCr of 1.07 mg/dL).   Medical History: Past Medical History:  Diagnosis Date  . Allergy   . Arthritis   . Blood transfusion   . Candidiasis of the esophagus 07-17-2000   EGD  . Cataract    bil eyes  . Colon polyps 05-27-2002   Tubulovillous Adenoma-Colonoscopy  . Diverticulosis of colon (without mention of hemorrhage) 2008,2006,2003   Colonoscopy  . Esophageal reflux 2008   EGD  . Glaucoma   . Hiatal hernia 2002,2003,2013   EGD  . Hyperparathyroidism, unspecified ([place])   . Hypertension   . Internal hemorrhoids without mention of complication 0932   Colonoscopy  . Multiple falls    Pt falls asleep without warning and has encounter multiple falls  . Obstructive sleep apnea (adult) (pediatric)   . Other vitamin B12 deficiency anemia   . Pneumonia    years ago  . Restless legs syndrome (RLS)   . Stricture and stenosis of esophagus 05-27-2002   EGD  . Unspecified asthma(493.90)     Medications:  PTA:  Xarelto 20mg  daily - Last dose noted as 10/12/17 @ 08:43  Assessment:  84 yr male admitted with pleural effusion and pneumonia  PMH significant for LLE DVT and patient on Xarelto.  Upon admission, Xarelto held for possible thoracentesis and pharmacy consulted to dose IV heparin  Baseline INR = 1.3 (Xarelto falsely elevates INR)                      APTT = 39 sec                 Heparin level = 1.34 (falsely elevated due to effects of Xarelto on heparin level) Today, 5/1  0406 aPtt = 132 sec and HL= 0.93 both above goal, no bleeding or infusion issues per RN.   Goal of Therapy:  Heparin level 0.3-0.7 units/ml aPTT 66-102 seconds Monitor platelets by anticoagulation protocol: Yes   Plan:   Hold Heparin infusion x 1 hour.   Restart heparin drip at 900 units/hr (~ 0715)  Recheck aptt in 8 hours  Due to Xarelto's effect to falsely elevate heparin level: will monitor aPTT along with heparin level until the two labs correlate, and then will monitor only with heparin levels  Follow daily CBC and aPTT/heparin levels while on heparin  Dorrene German, PharmD 10/14/2017,5:00 AM

## 2017-10-14 NOTE — Progress Notes (Signed)
ANTICOAGULATION CONSULT NOTE - f/u Consult  Pharmacy Consult for Heparin Indication: DVT  No Known Allergies  Patient Measurements: Weight: 164 lb 0.4 oz (74.4 kg) Heparin Dosing Weight: actual weight  Vital Signs: Temp: 97.9 F (36.6 C) (05/01 2031) Temp Source: Oral (05/01 2031) BP: 105/51 (05/01 2031) Pulse Rate: 62 (05/01 2031)  Labs: Recent Labs    10/13/17 1433 10/14/17 0406 10/14/17 0938 10/14/17 2254  HGB 9.8* 8.6*  --   --   HCT 32.0* 26.7*  --   --   PLT 138* 123*  --   --   APTT 39* 132*  --  86*  LABPROT 16.1*  --   --   --   INR 1.30  --   --   --   HEPARINUNFRC 1.34* 0.93*  --   --   CREATININE 1.07  --  1.00  --     Estimated Creatinine Clearance: 56.8 mL/min (by C-G formula based on SCr of 1 mg/dL).   Medical History: Past Medical History:  Diagnosis Date  . Allergy   . Arthritis   . Blood transfusion   . Candidiasis of the esophagus 07-17-2000   EGD  . Cataract    bil eyes  . Colon polyps 05-27-2002   Tubulovillous Adenoma-Colonoscopy  . Diverticulosis of colon (without mention of hemorrhage) 2008,2006,2003   Colonoscopy  . Esophageal reflux 2008   EGD  . Glaucoma   . Hiatal hernia 2002,2003,2013   EGD  . Hyperparathyroidism, unspecified ([address])   . Hypertension   . Internal hemorrhoids without mention of complication 5329   Colonoscopy  . Multiple falls    Pt falls asleep without warning and has encounter multiple falls  . Obstructive sleep apnea (adult) (pediatric)   . Other vitamin B12 deficiency anemia   . Pneumonia    years ago  . Restless legs syndrome (RLS)   . Stricture and stenosis of esophagus 05-27-2002   EGD  . Unspecified asthma(493.90)     Medications:  PTA:  Xarelto 20mg  daily - Last dose noted as 10/12/17 @ 08:43  Assessment:  84 yr male admitted with pleural effusion and pneumonia  PMH significant for LLE DVT and patient on Xarelto.  Upon admission, Xarelto held for possible thoracentesis and pharmacy  consulted to dose IV heparin  Baseline INR = 1.3 (Xarelto falsely elevates INR)                      APTT = 39 sec                      Heparin level = 1.34 (falsely elevated due to effects of Xarelto on heparin level) Today, 5/1  0406 aPtt = 132 sec and HL= 0.93 both above goal  2254 aPtt = 86 sec at goal, no infusion or bleeding issues per RN  Goal of Therapy:  Heparin level 0.3-0.7 units/ml aPTT 66-102 seconds Monitor platelets by anticoagulation protocol: Yes   Plan:   Continue heparin drip at 900 units/hr  Recheck aptt and HL with am labs  Due to Xarelto's effect to falsely elevate heparin level: will monitor aPTT along with heparin level until the two labs correlate, and then will monitor only with heparin levels  Follow daily CBC and aPTT/heparin levels while on heparin  Dorrene German, PharmD 10/14/2017,11:57 PM

## 2017-10-14 NOTE — Consult Note (Addendum)
[facility] Nurse wound consult note Reason for Consult: Consult requested for multiple wounds. Pt is immobile, emecaited, and frequently incontinent of stool.  Wound type: Sacrum with stage 2 pressure injury; 2X2X.1cm, red moist woundbed, small amt tan drainage Left outer ankle with pink healing previous pressure injury appears to be healing stage 3; .1X.1X.1cm, no odor or drainage Left hip with unstageable pressure injury, 4.5X2cm, 100% tightly adhered yellow slough, small amt tan drainage, no odor or fluctuance. Left ischium with unstageable pressure injury; 2X1cm, 100% tightly adhered yellow slough, small amt tan drainage, no odor or fluctuance. Back and bilat buttocks with multiple red abrasions scattered all across locations. Groin with red macular papular rash; appearance consistent with probable candidiasis. Left posterior leg with heaing full thickness wound; 1X1X.1cm, yellow dry wound bed, no odor or drainage. Pressure Injury POA: Yes Plan:  Son at bedside for an extended discussion regarding goals of care and topical treatment.  He was told that "a cream" for wounds would be $900 a month at the SNF where patient is staying and is hesitant to order this; description is consistent with probable Santyl.  Will begin Silvadene for enzymatic debridement of nonviable tissue to left hip and ischium wounds, and son states he will investigate options with his insurance company to see if they will cover a significant portion of the [place] cost if it is ordered.   Foam dressings to protect sacrum and left posterior leg.  Barrier cream and antifungal powder to groin.  Son denies further questions at this time. Please re-consult if further assistance is needed.  Thank-you,  Julien Girt MSN, [place], [place], [place], [place]

## 2017-10-14 NOTE — Procedures (Signed)
Ultrasound-guided diagnostic and therapeutic left thoracentesis performed yielding 200 cc of slightly hazy, yellow fluid. No immediate complications. Follow-up chest x-ray pending.The fluid was sent to the lab for preordered studies. The left effusion was small by today's [place].

## 2017-10-14 NOTE — Progress Notes (Addendum)
PROGRESS NOTE    Derrick Sanchez  ELF:[id number] DOB: [birth year] DOA: 10/13/2017 PCP: [name], MD    Brief Narrative:  Derrick Sanchez is a 84 y.o. male with medical history significant of forspinal epidural abscess, lumbar radiculopathy, paraparesis of lower limbs, chronic osteomyelitis of lumbar spine, frequent falls, malnutrition, failure to thrive who presented to ED [facility] from SNF with complaints of dyspnea and persistent productive cough.  Patient had recent admission and was treated for pneumonia as well as pleural effusion.  Presents with similar complaints  ED Course: Patient was found to have recurring left pleural effusion and possible pneumonia we are consulted for further evaluation and recommendations     Assessment & Plan:   Principal Problem:   Acute on chronic respiratory failure with hypoxia ([place]) Active Problems:   Pleural effusion   HCAP (healthcare-associated pneumonia)   Bacteremia due to Gram-positive bacteria   GASTROESOPHAGEAL REFLUX DISEASE   Protein-calorie malnutrition, severe ([place])   Essential hypertension   Pressure injury of skin   Pleural effusion on left   Deep vein thrombosis (DVT) of lower extremity ([place])  #1 acute on chronic respiratory failure with hypoxia secondary to healthcare associated pneumonia and recurrent left sided pleural effusion Patient had presented acute on chronic respiratory failure chest x-ray consistent with healthcare associated pneumonia recurrent left-sided pleural effusion.  Patient recently hospitalized with similar symptoms.  Patient with increased O2 requirements overnight and was on the nonrebreather currently some clinical improvement and now on nasal cannula and slowly improving.  Patient status post left-sided thoracentesis with 200 cc fluid removed.  Labs pending.  Patient has been pancultured and blood cultures preliminary readings positive for gram-positive cocci.  Gram stain and culture pending.  Continue empiric  IV vancomycin and IV cefepime.  Start Mucinex.  Place on scheduled nebulizers.  Continue PPI.  Add Flonase and Claritin.  Follow.  2.  Bacteremia due to gram-positive bacteria Preliminary blood cultures with gram-positive bacteremia 1/2.  Final cultures and sensitivities pending.  Continue empiric IV vancomycin.  Blood cultures indeed positive for staph will likely need a 2D echo to rule out endocarditis and consultation with ID for further evaluation and management.  3.  Gastroesophageal reflux disease PPI.  4.  Hypertension Blood pressure borderline.  Discontinue diuretics.  Give a bolus of IV fluids.  Placed on normal saline at 100 cc/h for now monitor closely for volume overload.  5.  Stage II sacral pressure injury/unstageable pressure injury of left hip and left ischium/multiple abrasions to lower back and bilateral buttocks/left posterior leg with healing full-thickness wound Patient has been seen by wound care nurse.  Continue current recommended dressing changes.  #6 severe protein calorie malnutrition Continue nutritional supplementation.  7.  Groin candidiasis Place on nystatin powder.  8.  Diastolic heart failure 2D echo done 09/22/2017 with a EF of 60 to 58%, grade 1 diastolic dysfunction.  Will discontinue Lasix for now as patient's blood pressure is borderline and patient being hydrated.  Monitor closely for volume overload with hydration.  9.  Chronic low back pain/history of chronic vertebral osteomyelitis Patient on chronic suppressive therapy with Levaquin.  Levaquin on hold as patient on broad-spectrum antibiotics at this time.  Resume Levaquin once acute antibiotics have been discontinued.  Follow.  10.  History of left lower extremity DVT Was on Xarelto at home however due to thoracentesis today patient placed on heparin.  Once no further procedures are needed will place back on home regimen of Xarelto.  #11  anemia Patient with no overt bleeding.  Check an anemia  panel.  Follow H&H.  Transfusion threshold hemoglobin less than 7.    DVT prophylaxis: Heparin Code Status: Partial code Family Communication: Updated son at bedside.  Answered all questions. Disposition Plan: To be determined pending hospitalization.   Consultants:   Interventional radiology  Procedures:   Chest x-ray 10/13/2017  Ultrasound-guided diagnostic and therapeutic left thoracentesis with 200 cc removed 10/14/2017 per Rowe Robert, PA/Dr. Barbie Banner  Antimicrobials:   IV cefepime 10/13/2017  Vancomycin 10/13/2017   Subjective: Patient in bed.  States shortness of breath improving since admission however not at baseline.  No chest pain.  No nausea or vomiting.  Denies any dizziness.  Objective: Vitals:   10/14/17 1331 10/14/17 1417 10/14/17 1538 10/14/17 2031  BP: (!) 68/43 119/69  (!) 105/51  Pulse:  (!) 59  62  Resp:    19  Temp:    97.9 F (36.6 C)  TempSrc:   Axillary Oral  SpO2:    94%  Weight:        Intake/Output Summary (Last 24 hours) at 10/14/2017 2107 Last data filed at 10/14/2017 1500 Gross per 24 hour  Intake 650.15 ml  Output 650 ml  Net 0.15 ml   Filed Weights   10/14/17 0409  Weight: 74.4 kg (164 lb 0.4 oz)    Examination:  General exam: Frail.  Chronically ill-appearing. Respiratory system: Clear to auscultation. Respiratory effort normal. Cardiovascular system: S1 & S2 heard, RRR. No JVD, murmurs, rubs, gallops or clicks. No pedal edema. Gastrointestinal system: Abdomen is nondistended, soft and nontender. No organomegaly or masses felt. Normal bowel sounds heard. Central nervous system: Alert and oriented. No focal neurological deficits. Extremities: Symmetric 5 x 5 power. Skin: Stage II pressure injury sacrum, previous pressure injury stage III left outer ankle/unstageable pressure injury left hip/left ischium with unstageable pressure injury/left posterior leg with posterior full-thickness wound. Psychiatry: Judgement and insight appear  normal. Mood & affect appropriate.     Data Reviewed: I have personally reviewed following labs and imaging studies  CBC: Recent Labs  Lab 10/13/17 1433 10/14/17 0406  WBC 9.3 10.5  NEUTROABS 8.0*  --   HGB 9.8* 8.6*  HCT 32.0* 26.7*  MCV 90.4 88.7  PLT 138* 062*   Basic Metabolic Panel: Recent Labs  Lab 10/13/17 1433 10/14/17 0938  NA 138 139  K 3.8 3.0*  CL 103 106  CO2 26 24  GLUCOSE 88 124*  BUN 29* 26*  CREATININE 1.07 1.00  CALCIUM 7.6* 7.2*  MG  --  1.7   GFR: Estimated Creatinine Clearance: 56.8 mL/min (by C-G formula based on SCr of 1 mg/dL). Liver Function Tests: Recent Labs  Lab 10/14/17 0938  AST 25  ALT 17  ALKPHOS 68  BILITOT 0.5  PROT 5.1*  ALBUMIN 1.6*   No results for input(s): LIPASE, AMYLASE in the last 168 hours. No results for input(s): AMMONIA in the last 168 hours. Coagulation Profile: Recent Labs  Lab 10/13/17 1433  INR 1.30   Cardiac Enzymes: No results for input(s): CKTOTAL, CKMB, CKMBINDEX, TROPONINI in the last 168 hours. BNP (last 3 results) No results for input(s): PROBNP in the last 8760 hours. HbA1C: No results for input(s): HGBA1C in the last 72 hours. CBG: Recent Labs  Lab 10/14/17 0726  GLUCAP 81   Lipid Profile: No results for input(s): CHOL, HDL, LDLCALC, TRIG, CHOLHDL, LDLDIRECT in the last 72 hours. Thyroid Function Tests: No results for input(s): TSH, T4TOTAL,  FREET4, T3FREE, THYROIDAB in the last 72 hours. Anemia Panel: Recent Labs    10/14/17 0938  VITAMINB12 783  FOLATE 22.4  FERRITIN 107  TIBC 137*  IRON 8*  RETICCTPCT 2.5   Sepsis Labs: Recent Labs  Lab 10/13/17 1448  LATICACIDVEN 1.46    Recent Results (from the past 240 hour(s))  Culture, blood (routine x 2)     Status: None (Preliminary result)   Collection Time: 10/13/17  2:33 PM  Result Value Ref Range Status   Specimen Description   Final    BLOOD LEFT ANTECUBITAL Performed at [facility], [place]  [address], [place], [place] [address]    Special Requests   Final    BOTTLES DRAWN AEROBIC AND ANAEROBIC Blood Culture adequate volume Performed at [place] [address], [place], [place] [address]    Culture  Setup Time   Final    GRAM POSITIVE COCCI IN CLUSTERS IN BOTH AEROBIC AND ANAEROBIC BOTTLES Organism ID to follow CRITICAL RESULT CALLED TO, READ BACK BY AND VERIFIED WITH: Melodye Ped PharmD 13:35 10/14/17 ([email or web address]) Performed at [facility], [address] [place], [place] [address]    Culture GRAM POSITIVE COCCI  Final   Report Status PENDING  Incomplete  Blood Culture ID Panel (Reflexed)     Status: Abnormal   Collection Time: 10/13/17  2:33 PM  Result Value Ref Range Status   Enterococcus species NOT DETECTED NOT DETECTED Final   Listeria monocytogenes NOT DETECTED NOT DETECTED Final   Staphylococcus species DETECTED (A) NOT DETECTED Final    Comment: Methicillin (oxacillin) resistant coagulase negative staphylococcus. Possible blood culture contaminant (unless isolated from more than one blood culture draw or clinical case suggests pathogenicity). No antibiotic treatment is indicated for blood  culture contaminants. CRITICAL RESULT CALLED TO, READ BACK BY AND VERIFIED WITH: Melodye Ped PharmD 13:35 10/14/17 ([email or web address])    Staphylococcus aureus NOT DETECTED NOT DETECTED Final   Methicillin resistance DETECTED (A) NOT DETECTED Final    Comment: CRITICAL RESULT CALLED TO, READ BACK BY AND VERIFIED WITH: Melodye Ped PharmD 13:35 10/14/17 ([email or web address])    Streptococcus species NOT DETECTED NOT DETECTED Final   Streptococcus agalactiae NOT DETECTED NOT DETECTED Final   Streptococcus pneumoniae NOT DETECTED NOT DETECTED Final   Streptococcus pyogenes NOT DETECTED NOT DETECTED Final   Acinetobacter baumannii NOT DETECTED NOT DETECTED Final   Enterobacteriaceae species NOT DETECTED NOT DETECTED Final   Enterobacter cloacae complex NOT DETECTED NOT DETECTED Final     Escherichia coli NOT DETECTED NOT DETECTED Final   Klebsiella oxytoca NOT DETECTED NOT DETECTED Final   Klebsiella pneumoniae NOT DETECTED NOT DETECTED Final   Proteus species NOT DETECTED NOT DETECTED Final   Serratia marcescens NOT DETECTED NOT DETECTED Final   Haemophilus influenzae NOT DETECTED NOT DETECTED Final   Neisseria meningitidis NOT DETECTED NOT DETECTED Final   Pseudomonas aeruginosa NOT DETECTED NOT DETECTED Final   Candida albicans NOT DETECTED NOT DETECTED Final   Candida glabrata NOT DETECTED NOT DETECTED Final   Candida krusei NOT DETECTED NOT DETECTED Final   Candida parapsilosis NOT DETECTED NOT DETECTED Final   Candida tropicalis NOT DETECTED NOT DETECTED Final  Culture, blood (routine x 2)     Status: None (Preliminary result)   Collection Time: 10/13/17  3:43 PM  Result Value Ref Range Status   Specimen Description   Final    BLOOD LEFT HAND Performed at [facility], [address]  [place], [place] [address]    Special Requests   Final    BOTTLES DRAWN AEROBIC AND ANAEROBIC Blood Culture adequate volume Performed at [place] [address] [place], [place] [address]    Culture   Final    NO GROWTH < 24 HOURS Performed at [place] [address] [place], [place] [address]    Report Status PENDING  Incomplete  Culture, sputum-assessment     Status: None   Collection Time: 10/13/17 10:12 PM  Result Value Ref Range Status   Specimen Description EXPECTORATED SPUTUM  Final   Special Requests Immunocompromised  Final   Sputum evaluation   Final    THIS SPECIMEN IS ACCEPTABLE FOR SPUTUM CULTURE Performed at [facility], [place] [address], [place], [place] [address]    Report Status 10/13/2017 FINAL  Final  Culture, respiratory (NON-Expectorated)     Status: None (Preliminary result)   Collection Time: 10/13/17 10:12 PM  Result Value Ref Range Status   Specimen Description   Final     EXPECTORATED SPUTUM Performed at [place] [address], [place], [place] [address]    Special Requests   Final    Immunocompromised Reflexed from [phone] Performed at [facility], [place] [address] [place], [place] [address]    Gram Stain   Final    ABUNDANT WBC PRESENT, PREDOMINANTLY PMN FEW GRAM POSITIVE COCCI FEW GRAM POSITIVE RODS FEW GRAM NEGATIVE COCCOBACILLI RARE YEAST Performed at [facility], [place] [address], [place], [place] [address]    Culture PENDING  Incomplete   Report Status PENDING  Incomplete  Body fluid culture     Status: None (Preliminary result)   Collection Time: 10/14/17  1:51 PM  Result Value Ref Range Status   Specimen Description   Final    PLEURAL RIGHT Performed at [place] [address], [place], [place] [address]    Special Requests   Final    NONE Performed at [facility], [place] [address] [place], [place] [address]    Gram Stain   Final    ABUNDANT WBC PRESENT,BOTH PMN AND MONONUCLEAR NO ORGANISMS SEEN Performed at [facility], [place] [address] [place], [place] [address]    Culture PENDING  Incomplete   Report Status PENDING  Incomplete         Radiology Studies: Dg Chest 1 View  Result Date: 10/14/2017 CLINICAL DATA:  84 year old male status post ultrasound-guided left thoracentesis today. EXAM: CHEST  1 VIEW COMPARISON:  Left chest thoracentesis images 1218 hours. Portable chest 10/13/2017 and earlier. FINDINGS: Portable AP semi upright view at 8[address]40 hours. Decreased veiling opacity in the left lower lung, mildly improved left lung base ventilation, and no pneumothorax identified following thoracentesis. Continued veiling opacity in the right lower lung, right lung base ventilation has worsened since 09/30/2017. No pneumothorax or pulmonary edema. Stable cardiac size and mediastinal contours. Visualized tracheal air column is within normal  limits. Stable visible bowel gas pattern. IMPRESSION: 1. Mildly improved ventilation and no adverse features identified following left side thoracentesis. 2. Right side pleural effusion with lower lobe collapse or consolidation has progressed since 09/30/2017. Electronically Signed   By: Genevie Ann M.D.   On: 10/14/2017 14:07   Dg Chest Port 1 View  Result Date: 10/13/2017 CLINICAL DATA:  84 y/o  M; shortness of breath, cough, wheeze. EXAM: PORTABLE CHEST 1 VIEW COMPARISON:  09/30/2017 chest radiograph  FINDINGS: Increased large left and small right pleural effusions. Associated opacification of the lungs probably represents atelectasis. Pneumonia is possible. Cardiac silhouette obscured by the left-sided effusion. Aortic atherosclerosis. Stable background of chronic lung disease. Bones are demineralized and there are chronic rib fractures on the right. IMPRESSION: Increased large left and small right pleural effusions with associated lung opacities probably representing atelectasis. Underlying pneumonia is possible. Aortic atherosclerosis. Electronically Signed   By: Kristine Garbe M.D.   On: 10/13/2017 14:46   US Thoracentesis Asp Pleural Space W/img Guide  Result Date: 10/14/2017 INDICATION: Patient with history of pneumonia, heart failure, failure to thrive, bilateral pleural effusions; request made for diagnostic and therapeutic left thoracentesis. EXAM: ULTRASOUND GUIDED DIAGNOSTIC AND THERAPEUTIC LEFT THORACENTESIS MEDICATIONS: None COMPLICATIONS: None immediate. PROCEDURE: An ultrasound guided thoracentesis was thoroughly discussed with the patient and questions answered. The benefits, risks, alternatives and complications were also discussed. The patient understands and wishes to proceed with the procedure. Written consent was obtained. Ultrasound was performed to localize and mark an adequate pocket of fluid in the left chest. The area was then prepped and draped in the normal sterile fashion.  1% Lidocaine was used for local anesthesia. Under ultrasound guidance a 6 Fr Safe-T-Centesis catheter was introduced. Thoracentesis was performed. The catheter was removed and a dressing applied. FINDINGS: A total of approximately 200 cc of slightly hazy, yellow fluid was removed. Samples were sent to the laboratory as requested by the clinical team. IMPRESSION: Successful ultrasound guided diagnostic and therapeutic left thoracentesis yielding 200 cc of pleural fluid. Follow-up chest x-ray revealed no pneumothorax. Read by: Rowe Robert, PA-C Electronically Signed   By: Marybelle Killings M.D.   On: 10/14/2017 14:11        Scheduled Meds: . budesonide (PULMICORT) nebulizer solution  0.5 mg Nebulization BID  . ferrous sulfate  325 mg Oral Daily  . fluticasone  2 spray Each Nare Daily  . gabapentin  300 mg Oral BID  . guaiFENesin  1,200 mg Oral BID  . ipratropium-albuterol  3 mL Nebulization Once  . ipratropium-albuterol  3 mL Nebulization TID  . latanoprost  1 drop Both Eyes QHS  . lidocaine      . loratadine  10 mg Oral Daily  . multivitamin with minerals  1 tablet Oral Daily  . nystatin   Topical TID  . pantoprazole  40 mg Oral Daily  . pramipexole  1 mg Oral TID  . senna-docusate  2 tablet Oral QHS  . silver sulfADIAZINE   Topical Daily  . sodium chloride flush  3 mL Intravenous Q12H   Continuous Infusions: . sodium chloride 250 mL (10/13/17 1954)  . sodium chloride    . ceFEPime (MAXIPIME) IV Stopped (10/14/17 1904)  . heparin 900 Units/hr (10/14/17 1421)  . vancomycin       LOS: 1 day    Time spent: 35 minutes    Irine Seal, MD [facility] Pager [phone]  If 7PM-7AM, please contact night-coverage [email or web address] Password [email or web address] 10/14/2017, 9:07 PM

## 2017-10-15 DIAGNOSIS — J869 Pyothorax without fistula: Secondary | ICD-10-CM | POA: Diagnosis present

## 2017-10-15 LAB — BASIC METABOLIC PANEL
Anion gap: 6 (ref 5–15)
BUN: 24 mg/dL — AB (ref 6–20)
CALCIUM: 7.2 mg/dL — AB (ref 8.9–10.3)
CHLORIDE: 107 mmol/L (ref 101–111)
CO2: 24 mmol/L (ref 22–32)
CREATININE: 0.83 mg/dL (ref 0.61–1.24)
GFR calc Af Amer: >60 mL/min (ref >60)
GFR calc non Af Amer: >60 mL/min (ref >60)
Glucose, Bld: 94 mg/dL (ref 65–99)
Potassium: 3.8 mmol/L (ref 3.5–5.1)
SODIUM: 137 mmol/L (ref 135–145)

## 2017-10-15 LAB — CBC
HCT: 27.9 % — ABNORMAL LOW (ref 39.0–52.0)
Hemoglobin: 8.7 g/dL — ABNORMAL LOW (ref 13.0–17.0)
MCH: 28.1 pg (ref 26.0–34.0)
MCHC: 31.2 g/dL (ref 30.0–36.0)
MCV: 90 fL (ref 78.0–100.0)
PLATELETS: 114 10*3/uL — AB (ref 150–400)
RBC: 3.1 MIL/uL — ABNORMAL LOW (ref 4.22–5.81)
RDW: 19.8 % — AB (ref 11.5–15.5)
WBC: 8.7 10*3/uL (ref 4.0–10.5)

## 2017-10-15 LAB — DIFFERENTIAL
Basophils Absolute: 0 10*3/uL (ref 0.0–0.1)
Basophils Relative: 0 %
EOS ABS: 0.1 10*3/uL (ref 0.0–0.7)
EOS PCT: 1 %
LYMPHS ABS: 1 10*3/uL (ref 0.7–4.0)
Lymphocytes Relative: 11 %
Monocytes Absolute: 0.5 10*3/uL (ref 0.1–1.0)
Monocytes Relative: 5 %
NEUTROS PCT: 83 %
Neutro Abs: 7.3 10*3/uL (ref 1.7–7.7)

## 2017-10-15 LAB — PH, BODY FLUID: PH, BODY FLUID: 7.3

## 2017-10-15 LAB — APTT: aPTT: 78 seconds — ABNORMAL HIGH (ref 24–36)

## 2017-10-15 LAB — HEPARIN LEVEL (UNFRACTIONATED): HEPARIN UNFRACTIONATED: 0.34 [IU]/mL (ref 0.30–0.70)

## 2017-10-15 LAB — MAGNESIUM: Magnesium: 2.4 mg/dL (ref 1.7–2.4)

## 2017-10-15 MED ORDER — FUROSEMIDE 10 MG/ML IJ SOLN
20.0000 mg | Freq: Once | INTRAMUSCULAR | Status: AC
  Administered 2017-10-15: 20 mg via INTRAVENOUS
  Filled 2017-10-15: qty 2

## 2017-10-15 NOTE — Progress Notes (Addendum)
PROGRESS NOTE    Derrick Sanchez  EXH:[id number] DOB: 1930-08-11 DOA: 10/13/2017 PCP: [name], MD    Brief Narrative:  Derrick Sanchez is a 84 y.o. male with medical history significant of forspinal epidural abscess, lumbar radiculopathy, paraparesis of lower limbs, chronic osteomyelitis of lumbar spine, frequent falls, malnutrition, failure to thrive who presented to ED [facility] from SNF with complaints of dyspnea and persistent productive cough.  Patient had recent admission and was treated for pneumonia as well as pleural effusion.  Presents with similar complaints  ED Course: Patient was found to have recurring left pleural effusion and possible pneumonia we are consulted for further evaluation and recommendations     Assessment & Plan:   Principal Problem:   Acute on chronic respiratory failure with hypoxia ([place]) Active Problems:   Pleural effusion   HCAP (healthcare-associated pneumonia)   Bacteremia due to Gram-positive bacteria   GASTROESOPHAGEAL REFLUX DISEASE   Protein-calorie malnutrition, severe ([place])   Essential hypertension   Pressure injury of skin   At high risk for aspiration   Pleural effusion on left   Deep vein thrombosis (DVT) of lower extremity ([place])  #1 acute on chronic respiratory failure with hypoxia secondary to healthcare associated pneumonia and recurrent left sided pleural effusion/empyema Patient had presented acute on chronic respiratory failure chest x-ray consistent with healthcare associated pneumonia recurrent left-sided pleural effusion.  Patient recently hospitalized with similar symptoms.  Patient with increased O2 requirements the night of admission patient was requiring nonrebreather.  Some improvement with O2 however patient was on rhonchorous breath sounds.  Patient given fluid bolus and some IV fluids due to borderline blood pressure and patient's diuretics discontinued.  Saline lock IV fluids. Patient status post left-sided thoracentesis  with 200 cc fluid removed.  Body fluids consistent with an exudate and probable empyema.  Patient has had recurrent left-sided pleural effusions times approximately 3 over the past month requiring hospitalizations.  Blood cultures pending with 2/4 with gram-positive bacteremia preliminary results.  Continue empiric IV vancomycin and IV cefepime.  Continue Mucinex, scheduled nebs, PPI, Flonase, Claritin.  We will give a dose of Lasix 20 mg IV x1.  Due to concern for empyema/recurrent left-sided pleural effusions over the past month will consult with cardiothoracic surgery for further evaluation and recommendations.    2.  Bacteremia due to gram-positive bacteria Preliminary blood cultures 2/4 with gram-positive bacteremia.  Final cultures and sensitivities pending.  Continue empiric IV vancomycin. If  Blood cultures indeed positive for staph will likely need a 2D echo to rule out endocarditis and consultation with ID for further evaluation and management.  3.  Gastroesophageal reflux disease PPI.  4.  Hypertension Blood pressure was borderline and as such diuretics discontinued.  Patient on gentle hydration.  Blood pressure improved.  Continue to hold diuretics and antihypertensive medications.  Follow.    5.  Stage II sacral pressure injury/unstageable pressure injury of left hip and left ischium/multiple abrasions to lower back and bilateral buttocks/left posterior leg with healing full-thickness wound Patient has been seen by wound care nurse.  Continue current recommended dressing changes.  #6 severe protein calorie malnutrition Continue nutritional supplementation.  7.  Groin candidiasis Continue nystatin powder.   8.  Diastolic heart failure 2D echo done 09/22/2017 with a EF of 60 to 38%, grade 1 diastolic dysfunction.  Diuretics have been discontinued secondary to borderline/low blood pressure.  No signs of volume overload.  Continue gentle hydration and monitor closely.   9.  Chronic low  back pain/history of chronic vertebral osteomyelitis Patient on chronic suppressive therapy with Levaquin.  Levaquin not resumed during this hospitalization as patient on broad-spectrum IV antibiotics. Resume Levaquin once acute antibiotics have been discontinued.  Follow.  10.  History of left lower extremity DVT Was on Xarelto at home however due to thoracentesis patient placed on heparin.  Once no further procedures are needed will place back on home regimen of Xarelto.  #11 anemia/iron deficiency anemia Patient with no overt bleeding.  Anemia panel consistent with iron deficiency anemia with iron level of 8 and a ferritin of 107.  B12 level of 783.  Hemoglobin stable at 8.7.  Transfusion threshold hemoglobin less than 7.  Graft #  12.  Severe protein calorie malnutrition Nutritional supplementation.  13 prognosis Likely with a poor prognosis multiple admissions over the past month x3 with recurrent pleural effusions and pneumonia, history of spinal epidural abscess on chronic suppressive antibiotics, chronic osteomyelitis of the lumbar spine, failure to thrive, frequent falls, severe protein calorie malnutrition now with an empyema.  Patient on empiric IV antibiotics.  Palliative care consultation has been done and palliative care following.    DVT prophylaxis: Heparin Code Status: Partial code Family Communication: Updated son at bedside.  Answered all questions. Disposition Plan: To be determined pending hospitalization.   Consultants:   Interventional radiology  CT surgery pending  Procedures:   Chest x-ray 10/13/2017  Ultrasound-guided diagnostic and therapeutic left thoracentesis with 200 cc removed 10/14/2017 per Rowe Robert, PA/Dr. Barbie Banner  Antimicrobials:   IV cefepime 10/13/2017  Vancomycin 10/13/2017   Subjective: Patient in bed.  Patient states some improvement with his shortness of breath however not at baseline.  Denies any chest pain.  No nausea or emesis.   Patient with some complaints of intermittent diplopia.  Objective: Vitals:   10/14/17 2031 10/14/17 2141 10/15/17 0637 10/15/17 0806  BP: (!) 105/51  (!) 106/55   Pulse: 62  66   Resp: 19  20   Temp: 97.9 F (36.6 C)  (!) 97.4 F (36.3 C)   TempSrc: Oral  Oral   SpO2: 94% 95% (!) 77% 92%  Weight:        Intake/Output Summary (Last 24 hours) at 10/15/2017 1353 Last data filed at 10/15/2017 1016 Gross per 24 hour  Intake 1288.4 ml  Output 1600 ml  Net -311.6 ml   Filed Weights   10/14/17 0409  Weight: 74.4 kg (164 lb 0.4 oz)    Examination:  General exam: Frail.  Chronically ill-appearing.  Cachectic. Respiratory system: Diffuse rhonchorous coarse breath sounds.  Some decreased breath sounds in the left base.  No wheezing noted.  Respiratory effort normal. Cardiovascular system: Regular rate and rhythm no murmurs rubs or gallops.  No lower extremity edema.  No JVD.  Gastrointestinal system: Abdomen is soft, nontender, nondistended, positive bowel sounds.  No rebound.  No guarding.  Central nervous system: Alert and oriented. No focal neurological deficits. Extremities: Symmetric 5 x 5 power. Skin: Stage II pressure injury sacrum, previous pressure injury stage III left outer ankle/unstageable pressure injury left hip/left ischium with unstageable pressure injury/left posterior leg with posterior full-thickness wound. Psychiatry: Judgement and insight appear normal. Mood & affect appropriate.     Data Reviewed: I have personally reviewed following labs and imaging studies  CBC: Recent Labs  Lab 10/13/17 1433 10/14/17 0406 10/15/17 0605  WBC 9.3 10.5 8.7  NEUTROABS 8.0*  --  7.3  HGB 9.8* 8.6* 8.7*  HCT 32.0* 26.7* 27.9*  MCV 90.4 88.7 90.0  PLT 138* 123* 536*   Basic Metabolic Panel: Recent Labs  Lab 10/13/17 1433 10/14/17 0938 10/15/17 0605  NA 138 139 137  K 3.8 3.0* 3.8  CL 103 106 107  CO2 26 24 24   GLUCOSE 88 124* 94  BUN 29* 26* 24*  CREATININE 1.07  1.00 0.83  CALCIUM 7.6* 7.2* 7.2*  MG  --  1.7 2.4   GFR: Estimated Creatinine Clearance: 68.5 mL/min (by C-G formula based on SCr of 0.83 mg/dL). Liver Function Tests: Recent Labs  Lab 10/14/17 0938  AST 25  ALT 17  ALKPHOS 68  BILITOT 0.5  PROT 5.1*  ALBUMIN 1.6*   No results for input(s): LIPASE, AMYLASE in the last 168 hours. No results for input(s): AMMONIA in the last 168 hours. Coagulation Profile: Recent Labs  Lab 10/13/17 1433  INR 1.30   Cardiac Enzymes: No results for input(s): CKTOTAL, CKMB, CKMBINDEX, TROPONINI in the last 168 hours. BNP (last 3 results) No results for input(s): PROBNP in the last 8760 hours. HbA1C: No results for input(s): HGBA1C in the last 72 hours. CBG: Recent Labs  Lab 10/14/17 0726  GLUCAP 81   Lipid Profile: No results for input(s): CHOL, HDL, LDLCALC, TRIG, CHOLHDL, LDLDIRECT in the last 72 hours. Thyroid Function Tests: No results for input(s): TSH, T4TOTAL, FREET4, T3FREE, THYROIDAB in the last 72 hours. Anemia Panel: Recent Labs    10/14/17 0938  VITAMINB12 783  FOLATE 22.4  FERRITIN 107  TIBC 137*  IRON 8*  RETICCTPCT 2.5   Sepsis Labs: Recent Labs  Lab 10/13/17 1448  LATICACIDVEN 1.46    Recent Results (from the past 240 hour(s))  Culture, blood (routine x 2)     Status: Abnormal (Preliminary result)   Collection Time: 10/13/17  2:33 PM  Result Value Ref Range Status   Specimen Description   Final    BLOOD LEFT ANTECUBITAL Performed at [facility], [place] [address], [place], [place] [address]    Special Requests   Final    BOTTLES DRAWN AEROBIC AND ANAEROBIC Blood Culture adequate volume Performed at [place] [address], [place], [place] [address]    Culture  Setup Time   Final    GRAM POSITIVE COCCI IN CLUSTERS IN BOTH AEROBIC AND ANAEROBIC BOTTLES CRITICAL RESULT CALLED TO, READ BACK BY AND VERIFIED WITH: Melodye Ped PharmD 13:35 10/14/17 ([email or web address])     Culture (A)  Final    STAPHYLOCOCCUS SPECIES (COAGULASE NEGATIVE) THE SIGNIFICANCE OF ISOLATING THIS ORGANISM FROM A SINGLE SET OF BLOOD CULTURES WHEN MULTIPLE SETS ARE DRAWN IS UNCERTAIN. PLEASE NOTIFY THE MICROBIOLOGY DEPARTMENT WITHIN ONE WEEK IF SPECIATION AND SENSITIVITIES ARE REQUIRED. Performed at [facility], [place] [address] [place], [place] [address]    Report Status PENDING  Incomplete  Blood Culture ID Panel (Reflexed)     Status: Abnormal   Collection Time: 10/13/17  2:33 PM  Result Value Ref Range Status   Enterococcus species NOT DETECTED NOT DETECTED Final   Listeria monocytogenes NOT DETECTED NOT DETECTED Final   Staphylococcus species DETECTED (A) NOT DETECTED Final    Comment: Methicillin (oxacillin) resistant coagulase negative staphylococcus. Possible blood culture contaminant (unless isolated from more than one blood culture draw or clinical case suggests pathogenicity). No antibiotic treatment is indicated for blood  culture contaminants. CRITICAL RESULT CALLED TO, READ BACK BY AND VERIFIED WITH: Melodye Ped PharmD 13:35 10/14/17 ([email or web address])    Staphylococcus aureus NOT DETECTED NOT  DETECTED Final   Methicillin resistance DETECTED (A) NOT DETECTED Final    Comment: CRITICAL RESULT CALLED TO, READ BACK BY AND VERIFIED WITH: Melodye Ped PharmD 13:35 10/14/17 ([email or web address])    Streptococcus species NOT DETECTED NOT DETECTED Final   Streptococcus agalactiae NOT DETECTED NOT DETECTED Final   Streptococcus pneumoniae NOT DETECTED NOT DETECTED Final   Streptococcus pyogenes NOT DETECTED NOT DETECTED Final   Acinetobacter baumannii NOT DETECTED NOT DETECTED Final   Enterobacteriaceae species NOT DETECTED NOT DETECTED Final   Enterobacter cloacae complex NOT DETECTED NOT DETECTED Final   Escherichia coli NOT DETECTED NOT DETECTED Final   Klebsiella oxytoca NOT DETECTED NOT DETECTED Final   Klebsiella pneumoniae NOT DETECTED NOT DETECTED Final   Proteus species NOT DETECTED NOT  DETECTED Final   Serratia marcescens NOT DETECTED NOT DETECTED Final   Haemophilus influenzae NOT DETECTED NOT DETECTED Final   Neisseria meningitidis NOT DETECTED NOT DETECTED Final   Pseudomonas aeruginosa NOT DETECTED NOT DETECTED Final   Candida albicans NOT DETECTED NOT DETECTED Final   Candida glabrata NOT DETECTED NOT DETECTED Final   Candida krusei NOT DETECTED NOT DETECTED Final   Candida parapsilosis NOT DETECTED NOT DETECTED Final   Candida tropicalis NOT DETECTED NOT DETECTED Final  Culture, blood (routine x 2)     Status: None (Preliminary result)   Collection Time: 10/13/17  3:43 PM  Result Value Ref Range Status   Specimen Description   Final    BLOOD LEFT HAND Performed at [facility], [address], [place], [place] [address]    Special Requests   Final    BOTTLES DRAWN AEROBIC AND ANAEROBIC Blood Culture adequate volume Performed at [place] [address], [place], [place] [address]    Culture   Final    NO GROWTH < 24 HOURS Performed at [place] [address], [place], [place] [address]    Report Status PENDING  Incomplete  Culture, sputum-assessment     Status: None   Collection Time: 10/13/17 10:12 PM  Result Value Ref Range Status   Specimen Description EXPECTORATED SPUTUM  Final   Special Requests Immunocompromised  Final   Sputum evaluation   Final    THIS SPECIMEN IS ACCEPTABLE FOR SPUTUM CULTURE Performed at [facility], [place] [address] [place], [place] [address]    Report Status 10/13/2017 FINAL  Final  Culture, respiratory (NON-Expectorated)     Status: None (Preliminary result)   Collection Time: 10/13/17 10:12 PM  Result Value Ref Range Status   Specimen Description   Final    EXPECTORATED SPUTUM Performed at [place] [address] [place], [place] [address]    Special Requests   Final    Immunocompromised Reflexed from [id number] Performed at [facility], [place] [address] [place], [place] [address]    Gram Stain   Final    ABUNDANT WBC PRESENT, PREDOMINANTLY PMN FEW GRAM POSITIVE COCCI FEW GRAM POSITIVE RODS FEW GRAM NEGATIVE COCCOBACILLI RARE YEAST    Culture   Final    CULTURE REINCUBATED FOR BETTER GROWTH Performed at [facility], [place] [address], [place], [place] [address]    Report Status PENDING  Incomplete  Body fluid culture     Status: None (Preliminary result)   Collection Time: 10/14/17  1:51 PM  Result Value Ref Range Status   Specimen Description   Final    PLEURAL RIGHT Performed at [facility], [place] [address] [place], [place] [address]    Special Requests   Final    NONE Performed at [facility], [place] [address] [place], [place] [address]    Gram Stain   Final    ABUNDANT WBC PRESENT,BOTH PMN AND MONONUCLEAR NO ORGANISMS SEEN    Culture   Final    NO GROWTH < 24 HOURS Performed at [facility] [address] [place], [place] [address]    Report Status PENDING  Incomplete         Radiology Studies: Dg Chest 1 View  Result Date: 10/14/2017 CLINICAL DATA:  84 year old male status post ultrasound-guided left thoracentesis today. EXAM: CHEST  1 VIEW COMPARISON:  Left chest thoracentesis images 1218 hours. Portable chest 10/13/2017 and earlier. FINDINGS: Portable AP semi upright view at 81340 hours. Decreased veiling opacity in the left lower lung, mildly improved left lung base ventilation, and no pneumothorax identified following thoracentesis. Continued veiling opacity in the right lower lung, right lung base ventilation has worsened since 09/30/2017. No pneumothorax or pulmonary edema. Stable cardiac size and mediastinal contours. Visualized tracheal air column is within normal limits. Stable visible bowel gas pattern. IMPRESSION: 1. Mildly improved ventilation and no adverse features identified following left side thoracentesis. 2. Right  side pleural effusion with lower lobe collapse or consolidation has progressed since 09/30/2017. Electronically Signed   By: Genevie Ann M.D.   On: 10/14/2017 14:07   Dg Chest Port 1 View  Result Date: 10/13/2017 CLINICAL DATA:  84 y/o  M; shortness of breath, cough, wheeze. EXAM: PORTABLE CHEST 1 VIEW COMPARISON:  09/30/2017 chest radiograph FINDINGS: Increased large left and small right pleural effusions. Associated opacification of the lungs probably represents atelectasis. Pneumonia is possible. Cardiac silhouette obscured by the left-sided effusion. Aortic atherosclerosis. Stable background of chronic lung disease. Bones are demineralized and there are chronic rib fractures on the right. IMPRESSION: Increased large left and small right pleural effusions with associated lung opacities probably representing atelectasis. Underlying pneumonia is possible. Aortic atherosclerosis. Electronically Signed   By: Kristine Garbe M.D.   On: 10/13/2017 14:46   US Thoracentesis Asp Pleural Space W/img Guide  Result Date: 10/14/2017 INDICATION: Patient with history of pneumonia, heart failure, failure to thrive, bilateral pleural effusions; request made for diagnostic and therapeutic left thoracentesis. EXAM: ULTRASOUND GUIDED DIAGNOSTIC AND THERAPEUTIC LEFT THORACENTESIS MEDICATIONS: None COMPLICATIONS: None immediate. PROCEDURE: An ultrasound guided thoracentesis was thoroughly discussed with the patient and questions answered. The benefits, risks, alternatives and complications were also discussed. The patient understands and wishes to proceed with the procedure. Written consent was obtained. Ultrasound was performed to localize and mark an adequate pocket of fluid in the left chest. The area was then prepped and draped in the normal sterile fashion. 1% Lidocaine was used for local anesthesia. Under ultrasound guidance a 6 Fr Safe-T-Centesis catheter was introduced. Thoracentesis was performed. The catheter was  removed and a dressing applied. FINDINGS: A total of approximately 200 cc of slightly hazy, yellow fluid was removed. Samples were sent to the laboratory as requested by the clinical team. IMPRESSION: Successful ultrasound guided diagnostic and therapeutic left thoracentesis yielding 200 cc of pleural fluid. Follow-up chest x-ray revealed no pneumothorax. Read by: Rowe Robert, PA-C Electronically Signed   By: Marybelle Killings M.D.   On: 10/14/2017 14:11        Scheduled Meds: . budesonide (PULMICORT) nebulizer solution  0.5 mg Nebulization BID  . feeding supplement (ENSURE ENLIVE)  237 mL Oral BID BM  . ferrous sulfate  325 mg Oral Daily  . fluticasone  2 spray Each Nare Daily  . gabapentin  300 mg Oral BID  . guaiFENesin  1,200 mg Oral BID  . ipratropium-albuterol  3 mL Nebulization Once  . ipratropium-albuterol  3 mL Nebulization TID  . latanoprost  1 drop Both Eyes QHS  . loratadine  10 mg Oral Daily  . multivitamin with minerals  1 tablet Oral Daily  . nystatin   Topical TID  . pantoprazole  40 mg Oral Daily  . pramipexole  1 mg Oral TID  . senna-docusate  2 tablet Oral QHS  . silver sulfADIAZINE   Topical Daily  . sodium chloride flush  3 mL Intravenous Q12H   Continuous Infusions: . sodium chloride 250 mL (10/13/17 1954)  . sodium chloride 100 mL/hr at 10/14/17 2100  . ceFEPime (MAXIPIME) IV 1 g (10/15/17 0628)  . heparin 900 Units/hr (10/14/17 1421)  . vancomycin Stopped (10/15/17 0251)     LOS: 2 days    Time spent: 35 minutes    Irine Seal, MD [facility] Pager [phone]  If 7PM-7AM, please contact night-coverage [email or web address] Password [email or web address] 10/15/2017, 1:53 PM

## 2017-10-15 NOTE — Progress Notes (Signed)
Palliative:  Mr. Tuohy is resting comfortably in bed and is very pleasant without complaint. Understands that his swallow function is poor but also inquiring about returning home.   Called and spoke with son, [name], with Mr. Kinkead's permission. [name] is clear that his goals are to get his father home with his wife and 24/7 caregivers is priority. He has many questions regarding hospice as he also wishes for his father to return to the hospital again. We fully discussed that this is likely to happen within a few days of returning home and if Mr. Cerasoli would prefer to spend the end of his life in a hospital or at home. [name] seems to understand but indicates that his father wishes to return to the hospital. [name] understands that even with return to the hospital his father's time is very, very limited and that even with aggressive measures his father will eventually pass even in the hospital.   [name] is interested in discussing these goals further with hospice to see if their philosophy fits with their goals. He would also like to speak further with [facility] about home health options if hospice does not align with his goals (which is unlikely given very poor prognosis even with desire for hospital return). I have connected him with CMR[facility] and hospice liaison.   83 min  Vinie Sill, NP Palliative Medicine Team Pager # [phone] (M-F 8a-5p) Team Phone # [phone] (Nights/Weekends)

## 2017-10-15 NOTE — Progress Notes (Signed)
ANTICOAGULATION CONSULT NOTE - f/u Consult  Pharmacy Consult for Heparin Indication: DVT  No Known Allergies  Patient Measurements: Weight: 164 lb 0.4 oz (74.4 kg) Heparin Dosing Weight: actual weight  Vital Signs: Temp: 97.4 F (36.3 C) (05/02 0637) Temp Source: Oral (05/02 0637) BP: 106/55 (05/02 8032) Pulse Rate: 66 (05/02 0637)  Labs: Recent Labs    10/13/17 1433 10/14/17 0406 10/14/17 1224 10/14/17 2254 10/15/17 0605  HGB 9.8* 8.6*  --   --  8.7*  HCT 32.0* 26.7*  --   --  27.9*  PLT 138* 123*  --   --  114*  APTT 39* 132*  --  86* 78*  LABPROT 16.1*  --   --   --   --   INR 1.30  --   --   --   --   HEPARINUNFRC 1.34* 0.93*  --   --  0.34  CREATININE 1.07  --  1.00  --  0.83    Estimated Creatinine Clearance: 68.5 mL/min (by C-G formula based on SCr of 0.83 mg/dL).   Medical History: Past Medical History:  Diagnosis Date  . Allergy   . Arthritis   . Blood transfusion   . Candidiasis of the esophagus 07-17-2000   EGD  . Cataract    bil eyes  . Colon polyps 05-27-2002   Tubulovillous Adenoma-Colonoscopy  . Diverticulosis of colon (without mention of hemorrhage) 2008,2006,2003   Colonoscopy  . Esophageal reflux 2008   EGD  . Glaucoma   . Hiatal hernia 2002,2003,2013   EGD  . Hyperparathyroidism, unspecified ([place])   . Hypertension   . Internal hemorrhoids without mention of complication 8250   Colonoscopy  . Multiple falls    Pt falls asleep without warning and has encounter multiple falls  . Obstructive sleep apnea (adult) (pediatric)   . Other vitamin B12 deficiency anemia   . Pneumonia    years ago  . Restless legs syndrome (RLS)   . Stricture and stenosis of esophagus 05-27-2002   EGD  . Unspecified asthma(493.90)     Medications:  PTA:  Xarelto 20mg  daily - Last dose noted as 10/12/17 @ 08:43  Assessment:  84 yr male admitted with pleural effusion and pneumonia  PMH significant for LLE DVT and patient on Xarelto.  Upon admission,  Xarelto held for possible thoracentesis and pharmacy consulted to dose IV heparin  Baseline INR = 1.3 (Xarelto falsely elevates INR)                      APTT = 39 sec                      Heparin level = 1.34 (falsely elevated due to effects of Xarelto on heparin level) Today, 5/1   aPtt = 78 sec and HL= 0.34 both at goal  No bleeding or other issues per RN  H/H low  Plts 114, decreased  Goal of Therapy:  Heparin level 0.3-0.7 units/ml aPTT 66-102 seconds Monitor platelets by anticoagulation protocol: Yes   Plan:   Continue heparin drip at 900 units/hr  APTT and HL correlating, will use only heparin level moving forward  Daily HL and CBC  Dolly Rias RPh 10/15/2017, 8:08 AM Pager [phone]

## 2017-10-15 NOTE — Progress Notes (Signed)
Hospice and Palliative Care and [facility]) Hospital Liaison Visit  Received request to speak with son, [name] re: hospice services with [facility].  Met with son and discussed hospice philosophy in great detail and answered questions and concerns.  Discussed how services work with hospice in the facility, hospice in the home and inpatient residential hospice. Son has reserved 24-hour caregivers from [place] in hopes to take the patient home, where he resides with his wife of 60+ years.  Son would like to speak the patient and the patient's wife to determine if they are agreeable to home hospice services, which he feels best align with the patient's goals of care at this time. Plan to follow-up with [name] in the morning to determine plan of care.  Beckie Salts, CMRN re: plan.  Please feel free to contact with any hospice-related questions.  Thank you,  Freddi Starr RN, [place] [facility] Liaison [phone]  [facility] liaisons now on [place].

## 2017-10-16 DIAGNOSIS — J9621 Acute and chronic respiratory failure with hypoxia: Secondary | ICD-10-CM

## 2017-10-16 DIAGNOSIS — J869 Pyothorax without fistula: Secondary | ICD-10-CM

## 2017-10-16 DIAGNOSIS — J90 Pleural effusion, not elsewhere classified: Secondary | ICD-10-CM

## 2017-10-16 DIAGNOSIS — J189 Pneumonia, unspecified organism: Principal | ICD-10-CM

## 2017-10-16 LAB — BASIC METABOLIC PANEL
ANION GAP: 8 (ref 5–15)
BUN: 21 mg/dL — ABNORMAL HIGH (ref 6–20)
CALCIUM: 7.4 mg/dL — AB (ref 8.9–10.3)
CO2: 23 mmol/L (ref 22–32)
Chloride: 107 mmol/L (ref 101–111)
Creatinine, Ser: 0.73 mg/dL (ref 0.61–1.24)
Glucose, Bld: 81 mg/dL (ref 65–99)
POTASSIUM: 3.6 mmol/L (ref 3.5–5.1)
SODIUM: 138 mmol/L (ref 135–145)

## 2017-10-16 LAB — CBC
HCT: 27.9 % — ABNORMAL LOW (ref 39.0–52.0)
HEMOGLOBIN: 8.7 g/dL — AB (ref 13.0–17.0)
MCH: 27.9 pg (ref 26.0–34.0)
MCHC: 31.2 g/dL (ref 30.0–36.0)
MCV: 89.4 fL (ref 78.0–100.0)
PLATELETS: 108 10*3/uL — AB (ref 150–400)
RBC: 3.12 MIL/uL — AB (ref 4.22–5.81)
RDW: 19.6 % — ABNORMAL HIGH (ref 11.5–15.5)
WBC: 7 10*3/uL (ref 4.0–10.5)

## 2017-10-16 LAB — CULTURE, BLOOD (ROUTINE X 2): SPECIAL REQUESTS: ADEQUATE

## 2017-10-16 LAB — CULTURE, RESPIRATORY

## 2017-10-16 LAB — CULTURE, RESPIRATORY W GRAM STAIN: Culture: NORMAL

## 2017-10-16 LAB — HEPARIN LEVEL (UNFRACTIONATED)
HEPARIN UNFRACTIONATED: 0.29 [IU]/mL — AB (ref 0.30–0.70)
HEPARIN UNFRACTIONATED: 0.23 [IU]/mL — AB (ref 0.30–0.70)
Heparin Unfractionated: 0.2 IU/mL — ABNORMAL LOW (ref 0.30–0.70)

## 2017-10-16 LAB — MAGNESIUM: MAGNESIUM: 2.1 mg/dL (ref 1.7–2.4)

## 2017-10-16 MED ORDER — POTASSIUM CHLORIDE CRYS ER 20 MEQ PO TBCR
20.0000 meq | EXTENDED_RELEASE_TABLET | Freq: Once | ORAL | Status: AC
  Administered 2017-10-16: 20 meq via ORAL
  Filled 2017-10-16: qty 1

## 2017-10-16 MED ORDER — FUROSEMIDE 20 MG PO TABS
20.0000 mg | ORAL_TABLET | Freq: Two times a day (BID) | ORAL | Status: DC
  Administered 2017-10-16 – 2017-10-17 (×2): 20 mg via ORAL
  Filled 2017-10-16 (×2): qty 1

## 2017-10-16 MED ORDER — HEPARIN (PORCINE) IN NACL 100-0.45 UNIT/ML-% IJ SOLN
1250.0000 [IU]/h | INTRAMUSCULAR | Status: DC
  Administered 2017-10-16: 1100 [IU]/h via INTRAVENOUS
  Filled 2017-10-16 (×2): qty 250

## 2017-10-16 MED ORDER — HEPARIN (PORCINE) IN NACL 100-0.45 UNIT/ML-% IJ SOLN
1450.0000 [IU]/h | INTRAMUSCULAR | Status: DC
  Filled 2017-10-16: qty 250

## 2017-10-16 MED ORDER — HEPARIN (PORCINE) IN NACL 100-0.45 UNIT/ML-% IJ SOLN
1450.0000 [IU]/h | INTRAMUSCULAR | Status: DC
  Administered 2017-10-17 (×2): 1450 [IU]/h via INTRAVENOUS
  Filled 2017-10-16 (×3): qty 250

## 2017-10-16 NOTE — Progress Notes (Signed)
ANTICOAGULATION CONSULT NOTE - f/u Consult  Pharmacy Consult for Heparin Indication: DVT  No Known Allergies  Patient Measurements: Weight: 142 lb (64.4 kg) Heparin Dosing Weight: actual weight  Vital Signs: Temp: 97.4 F (36.3 C) (05/03 0539) Temp Source: Oral (05/03 0539) BP: 127/60 (05/03 0539) Pulse Rate: 64 (05/03 0539)  Labs: Recent Labs    10/13/17 1433 10/14/17 0406 10/14/17 9767 10/14/17 2254 10/15/17 0605 10/16/17 0407 10/16/17 1249  HGB 9.8* 8.6*  --   --  8.7* 8.7*  --   HCT 32.0* 26.7*  --   --  27.9* 27.9*  --   PLT 138* 123*  --   --  114* 108*  --   APTT 39* 132*  --  86* 78*  --   --   LABPROT 16.1*  --   --   --   --   --   --   INR 1.30  --   --   --   --   --   --   HEPARINUNFRC 1.34* 0.93*  --   --  0.34 0.20* 0.29*  CREATININE 1.07  --  1.00  --  0.83 0.73  --     Estimated Creatinine Clearance: 61.5 mL/min (by C-G formula based on SCr of 0.73 mg/dL).   Medical History: Past Medical History:  Diagnosis Date  . Allergy   . Arthritis   . Blood transfusion   . Candidiasis of the esophagus 07-17-2000   EGD  . Cataract    bil eyes  . Colon polyps 05-27-2002   Tubulovillous Adenoma-Colonoscopy  . Diverticulosis of colon (without mention of hemorrhage) 2008,2006,2003   Colonoscopy  . Esophageal reflux 2008   EGD  . Glaucoma   . Hiatal hernia 2002,2003,2013   EGD  . Hyperparathyroidism, unspecified ([place])   . Hypertension   . Internal hemorrhoids without mention of complication 3419   Colonoscopy  . Multiple falls    Pt falls asleep without warning and has encounter multiple falls  . Obstructive sleep apnea (adult) (pediatric)   . Other vitamin B12 deficiency anemia   . Pneumonia    years ago  . Restless legs syndrome (RLS)   . Stricture and stenosis of esophagus 05-27-2002   EGD  . Unspecified asthma(493.90)     Medications:  PTA:  Xarelto 20mg  daily - Last dose noted as 10/12/17 @ 08:43  Assessment:  84 yr male admitted with  pleural effusion and pneumonia  PMH significant for LLE DVT and patient on Xarelto.  Upon admission, Xarelto held for possible thoracentesis and pharmacy consulted to dose IV heparin  Baseline INR = 1.3 (Xarelto falsely elevates INR)                      APTT = 39 sec                      Heparin level = 1.34 (falsely elevated due to effects of Xarelto on heparin level) Today, 5/3  0407 HL=0.20 below goal, no bleeding or IV issues per RN.   1249 HL 0.29 below goal, no bleeding or IV issues per RN  Goal of Therapy:  Heparin level 0.3-0.7 units/ml aPTT 66-102 seconds Monitor platelets by anticoagulation protocol: Yes   Plan:   Increase heparin drip to 1250 units/hr  Recheck HL in 8 hours  Daily HL and CBC  Dolly Rias RPh 10/16/2017, 1:38 PM Pager [phone]

## 2017-10-16 NOTE — Progress Notes (Signed)
LCSW following for facility placement.   Per hospice note patient and family are agreeable to hospice at [facility]. No beds avaiable today.   Patient's son is considering home with hospice, if no bed becomes availble. Hospice will follow up in the morning.   LCSW will continue to follow for dc needs.   Carolin Coy Angus Long Sebastian

## 2017-10-16 NOTE — Progress Notes (Signed)
Per conversation with pt son, he has set up a meeting with [facility] liaison for 2pm. This CM will continue to follow along and assist with disposition planning as needed. Marney Doctor RN,BSN,NCM [phone]

## 2017-10-16 NOTE — Consult Note (Signed)
PULMONARY / CRITICAL CARE MEDICINE   Name: Derrick Sanchez MRN: [id number] DOB: 02-May-1931    ADMISSION DATE:  10/13/2017 CONSULTATION DATE:  10/16/17   REFERRING MD:  Grandville Silos / [facility]  CHIEF COMPLAINT:  Pleural effusions ? Empyema o  HISTORY OF PRESENT ILLNESS:   37 yowm never smoker NH resident  with h/o paraparesis of LE's/ chronic ostiomyelitis of lumbar spine s/p spinal epidural abscess admit 4/17-   24/2019 with dx of asp pna cmplicated by Bilateral effusions tapped  09/28/17 x 750 and on L 10/14/17 with nl glucose, pH 7.3  elevated PMN's and T surgery initially consulted and did not feel pt op candidate and rec Chest CT and pulmonary eval but in meantime pt/fm agreed to hospice/ [facility] when bed available   Pt very poor historian only oriented to person and place and present President but denies sob/ cp/ purulent sputum - does have congested sounding cough and some mucoid sputum - denies dysphagia or dental problems/recent procedures   No   mucus plugs or hemoptysis or cp or chest tightness, subjective wheeze or overt sinus or hb symptoms. No unusual exposure hx or h/o childhood pna/ asthma or knowledge of premature birth.  Sleeping  At 30 degrees on 4lpm  without nocturnal  or early am exacerbation  of respiratory  c/o's or need for noct saba. Also denies any obvious fluctuation of symptoms with weather or environmental changes or other aggravating or alleviating factors except as outlined above   Current Allergies, Complete Past Medical History, Past Surgical History, Family History, and Social History were reviewed in [employer] record.  ROS  The following are not active complaints unless bolded Hoarseness, sore throat, dysphagia, dental problems, itching, sneezing,  nasal congestion or discharge of excess mucus or purulent secretions, ear ache,   fever, chills, sweats, unintended wt loss or wt gain, classically pleuritic or exertional cp,  orthopnea  pnd or arm/hand swelling  or leg swelling, presyncope, palpitations, abdominal pain, anorexia, nausea, vomiting, diarrhea  or change in bowel habits or change in bladder habits, change in stools or change in urine, dysuria, hematuria,  rash, arthralgias, visual complaints, headache, numbness, weakness or ataxia or problems with walking or coordination,  change in mood or  memory.        Current Meds  Medication Sig  . B Complex-C (B-COMPLEX WITH VITAMIN C) tablet Take 1 tablet by mouth daily.  . bimatoprost (LUMIGAN) 0.01 % SOLN Place 1 drop into both eyes at bedtime.  . Collagenase (SANTYL EX) Apply 1 application topically daily.  Marland Kitchen esomeprazole (NEXIUM) 40 MG capsule Take 40 mg by mouth daily.   . ferrous sulfate 325 (65 FE) MG tablet Take 1 tablet by mouth daily.  . furosemide (LASIX) 20 MG tablet Take 1 tablet (20 mg total) by mouth 2 (two) times daily.  Marland Kitchen gabapentin (NEURONTIN) 300 MG capsule Take 300-600 mg by mouth 2 (two) times daily. Take 1 capsules (300 mg) every morning, 2 capsules (600 mg) at bedtime  . HYDROcodone-acetaminophen (NORCO/VICODIN) 5-325 MG tablet Take 1 tablet by mouth every 6 (six) hours as needed for severe pain.  . hyoscyamine (LEVSIN, ANASPAZ) 0.125 MG tablet Take 0.125 mg by mouth once.  Marland Kitchen ipratropium-albuterol (DUONEB) 0.5-2.5 (3) MG/3ML SOLN Take 3 mLs by nebulization every 6 (six) hours as needed.  Marland Kitchen levofloxacin (LEVAQUIN) 500 MG tablet Take 1 tablet (500 mg total) by mouth daily.  . Multiple Vitamin (MULTIVITAMIN WITH MINERALS) TABS tablet Take 1  tablet by mouth daily.  . ondansetron (ZOFRAN) 4 MG tablet Take 1 tablet (4 mg total) by mouth every 6 (six) hours as needed for nausea.  . pramipexole (MIRAPEX) 1 MG tablet Take 1 mg by mouth 3 (three) times daily.   . predniSONE (DELTASONE) 10 MG tablet Prednisone 40 mg po daily x 3 day then Prednisone 30 mg po daily x 3 day then Prednisone 20 mg po daily x 3 day then Prednisone 10 mg daily x 2 day then stop...  .  senna-docusate (SENOKOT-S) 8.6-50 MG tablet Take 2 tablets by mouth at bedtime.  Alveda Reasons 20 MG TABS tablet Take 20 mg by mouth daily.      PAST MEDICAL HISTORY :  He  has a past medical history of Allergy, Arthritis, Blood transfusion, Candidiasis of the esophagus (07-17-2000), Cataract, Colon polyps (05-27-2002), Diverticulosis of colon (without mention of hemorrhage) (2008,2006,2003), Esophageal reflux (2008), Glaucoma, Hiatal hernia (2002,2003,2013), Hyperparathyroidism, unspecified ([place]), Hypertension, Internal hemorrhoids without mention of complication (0998), Multiple falls, Obstructive sleep apnea (adult) (pediatric), Other vitamin B12 deficiency anemia, Pneumonia, Restless legs syndrome (RLS), Stricture and stenosis of esophagus (05-27-2002), and Unspecified asthma(493.90).  PAST SURGICAL HISTORY: He  has a past surgical history that includes Parathyroidectomy (2008); Total knee arthroplasty (Left, 1998); Hip surgery (Right, 1998); Colonoscopy; Cataract extraction (Bilateral); Mass excision (10/14/2011); arm surgery; Eye surgery (Bilateral); Lumbar wound debridement (N/A, 03/12/2014); Lumbar fusion (x 3); PICC LINE PLACE PERIPHERAL ([facility] HX) (Left); and Lumbar laminectomy/ decompression with met-rx (N/A, 11/23/2014).  No Known Allergies  No current facility-administered medications on file prior to encounter.    Current Outpatient Medications on File Prior to Encounter  Medication Sig  . B Complex-C (B-COMPLEX WITH VITAMIN C) tablet Take 1 tablet by mouth daily.  . bimatoprost (LUMIGAN) 0.01 % SOLN Place 1 drop into both eyes at bedtime.  . Collagenase (SANTYL EX) Apply 1 application topically daily.  Marland Kitchen esomeprazole (NEXIUM) 40 MG capsule Take 40 mg by mouth daily.   . ferrous sulfate 325 (65 FE) MG tablet Take 1 tablet by mouth daily.  . furosemide (LASIX) 20 MG tablet Take 1 tablet (20 mg total) by mouth 2 (two) times daily.  Marland Kitchen gabapentin (NEURONTIN) 300 MG capsule Take 300-600 mg by  mouth 2 (two) times daily. Take 1 capsules (300 mg) every morning, 2 capsules (600 mg) at bedtime  . HYDROcodone-acetaminophen (NORCO/VICODIN) 5-325 MG tablet Take 1 tablet by mouth every 6 (six) hours as needed for severe pain.  . hyoscyamine (LEVSIN, ANASPAZ) 0.125 MG tablet Take 0.125 mg by mouth once.  Marland Kitchen ipratropium-albuterol (DUONEB) 0.5-2.5 (3) MG/3ML SOLN Take 3 mLs by nebulization every 6 (six) hours as needed.  Marland Kitchen levofloxacin (LEVAQUIN) 500 MG tablet Take 1 tablet (500 mg total) by mouth daily.  . Multiple Vitamin (MULTIVITAMIN WITH MINERALS) TABS tablet Take 1 tablet by mouth daily.  . ondansetron (ZOFRAN) 4 MG tablet Take 1 tablet (4 mg total) by mouth every 6 (six) hours as needed for nausea.  . pramipexole (MIRAPEX) 1 MG tablet Take 1 mg by mouth 3 (three) times daily.   . predniSONE (DELTASONE) 10 MG tablet Prednisone 40 mg po daily x 3 day then Prednisone 30 mg po daily x 3 day then Prednisone 20 mg po daily x 3 day then Prednisone 10 mg daily x 2 day then stop...  . senna-docusate (SENOKOT-S) 8.6-50 MG tablet Take 2 tablets by mouth at bedtime.  Alveda Reasons 20 MG TABS tablet Take 20 mg by mouth daily.  Marland Kitchen  lactobacillus (FLORANEX/LACTINEX) PACK Take 1 packet (1 g total) by mouth 3 (three) times daily with meals. (Patient not taking: Reported on 10/13/2017)  . lactose free nutrition (BOOST PLUS) LIQD Take 237 mLs by mouth 3 (three) times daily between meals. (Patient not taking: Reported on 10/13/2017)    FAMILY HISTORY:  His indicated that his mother is deceased. He indicated that his father is deceased. He reported the following about one of his sisters: heart disease. He reported the following about one of his brothers: neoplasm of brain. He reported the following about another brother of unknown status: neoplasm of lung, bronchus. He reported the following about another brother of unknown status: leukemia. He indicated that the status of his neg hx is unknown. He indicated that the  status of his unknown relative is unknown.   SOCIAL HISTORY: He  reports that he has never smoked. He has never used smokeless tobacco. He reports that he does not drink alcohol or use drugs.    SUBJECTIVE:   pleasant but frail elderly wm with congested sounding cough  VITAL SIGNS: BP 139/70 (BP Location: Right Arm)   Pulse (!) 59   Temp (!) 97.3 F (36.3 C) (Oral)   Resp 18   Wt 142 lb (64.4 kg)   SpO2 99%   BMI 18.23 kg/m    Intake/Output Summary (Last 24 hours) at 10/16/2017 2031 Last data filed at 10/16/2017 1901 Gross per 24 hour  Intake 1340.52 ml  Output 1000 ml  Net 340.52 ml      HEMODYNAMICS:    VENTILATOR SETTINGS:    INTAKE / OUTPUT: I/O last 3 completed shifts: In: 2937.7 [P.O.:840; I.V.:1497.7; IV Piggyback:600] Out: 1600 [Urine:1600]  PHYSICAL EXAMINATION: Pt alert, oriented to person/ wlh nad @ 45 degrees hob on 4lpm with sat 96%  Vital signs reviewed   No jvd Oropharynx clear Neck supple Lungs with a   scattered exp = insp rhonchi bilaterally RRR no s3 or or sign murmur Abd obese with excursion >> Extr wam with no edema or clubbing noted Neuro  No motor deficits   LABS:  BMET Recent Labs  Lab 10/14/17 0938 10/15/17 0605 10/16/17 0407  NA 139 137 138  K 3.0* 3.8 3.6  CL 106 107 107  CO2 '24 24 23  ' BUN 26* 24* 21*  CREATININE 1.00 0.83 0.73  GLUCOSE 124* 94 81    Electrolytes Recent Labs  Lab 10/14/17 0938 10/15/17 0605 10/16/17 0407  CALCIUM 7.2* 7.2* 7.4*  MG 1.7 2.4 2.1    CBC Recent Labs  Lab 10/14/17 0406 10/15/17 0605 10/16/17 0407  WBC 10.5 8.7 7.0  HGB 8.6* 8.7* 8.7*  HCT 26.7* 27.9* 27.9*  PLT 123* 114* 108*    Coag's Recent Labs  Lab 10/13/17 1433 10/14/17 0406 10/14/17 2254 10/15/17 0605  APTT 39* 132* 86* 78*  INR 1.30  --   --   --     Sepsis Markers Recent Labs  Lab 10/13/17 1448  LATICACIDVEN 1.46    ABG No results for input(s): PHART, PCO2ART, PO2ART in the last 168  hours.  Liver Enzymes Recent Labs  Lab 10/14/17 0938  AST 25  ALT 17  ALKPHOS 68  BILITOT 0.5  ALBUMIN 1.6*    Cardiac Enzymes No results for input(s): TROPONINI, PROBNP in the last 168 hours.  Glucose Recent Labs  Lab 10/14/17 0726  GLUCAP 81    Imaging No results found.         ASSESSMENT / PLAN:  1) bilateral pleural effusions already tapped on R 09/28/17 no labs avail and on L 10/14/17 with nl glucose/ pH 7.3  with high pmns and only exudative by LDH ratio  With nl bnp  But given very low serum alb this probably has a mixed features bilaterally (both inflammatory and transudative) and unlikely to completely control longterm with any simple options esp in pt now NCB/hospice status.  Agree with T surgery not a candidate for VATS could use a pleurex and do lytics if turns out to have a complex parapneumonic process but would only purse this option for symptom relief if goals of care change from hospice admit to [facility], and only after CT Chest to sort out issue of complex vs simple effusion.   2) Resp failure/ acute hypoxemic well compensated on 4lpm s increased wob   3) s/p prob HCAP ? Asp mecanism no longer purulent sputum and note BC pos s epi so unless treating osteo or intravascular device probably does not need vanc but defer this issue to Primary team.    We will be available this weekend for advice but don't see any need at all for expedient intervention so if you decide to be more aggressive rec start with CT chest with contrast and give [place] a call   thx for asking [place] to see this nice gentleman   Christinia Gully, MD Pulmonary and [place] [phone] After 5:30 PM or weekends, use [facility] [phone]

## 2017-10-16 NOTE — Progress Notes (Signed)
Pharmacy Antibiotic Note  Derrick Sanchez is a 84 y.o. male admitted on 10/13/2017 with recurring left pleural effusion and pneumonia.  Recent hospitalization for pneumonia, and has been on Levaquin 500mg  po daily outpatient (last dose taken on 10/12/17).  Pharmacy has been consulted for Vancomycin dosing.  Scr 0.73, CrCl ~ 32mls/min WBC WNL afebrile  Plan:  Vancomycin  1gm IV q24h (Goal AUC 400-500)  Cefepime per MD   Follow renal function  waiting for cultures/sensitivities before de-escalation/stopping abx Weight: 164 lb 0.4 oz (74.4 kg)  Temp (24hrs), Avg:97.6 F (36.4 C), Min:97.4 F (36.3 C), Max:97.8 F (36.6 C)  Recent Labs  Lab 10/13/17 1433 10/13/17 1448 10/14/17 0406 10/14/17 0938 10/15/17 0605 10/16/17 0407  WBC 9.3  --  10.5  --  8.7 7.0  CREATININE 1.07  --   --  1.00 0.83 0.73  LATICACIDVEN  --  1.46  --   --   --   --     Estimated Creatinine Clearance: 71 mL/min (by C-G formula based on SCr of 0.73 mg/dL).    No Known Allergies  Antimicrobials this admission: 4/30 Vanc >>   4/30 Cefepime >>    Dose adjustments this admission:    Microbiology results: 4/30 BCx: Staphylocous species, MecA+  Thank you for allowing pharmacy to be a part of this patient's care.  Dolly Rias RPh 10/16/2017, 11:18 AM Pager [phone]

## 2017-10-16 NOTE — Progress Notes (Addendum)
Hospice and Palliative Care and [facility]) Hospital Liaison Visit  Followed-up with son, [name] re: goals of care and discharge plans with hospice.  Son verbalized interest in [facility], unfortunately there are no beds available at this time.  It is uncertain at this time, if the patient would be medical eligible for placement at [facility].  Discussed the potential for home with hospice.  Son is still considering home with hospice as an option and has continued to reserve 24 hour caregivers.  Plan is to follow-up with son in the morning, to determine if [facility] is an option.  If [employer] is not an option, son would like to arrange for patient to go home with hospice.  UPDATE:  Patient is not [facility] eligible.  [facility] Liaison will follow up with family on 10/17/17 to discuss home with hospice services and determine plan moving forward.  CSW is not aware, liaison will follow up with weekend CSW on 10/17/17.  Please feel free to contact with any hospice-related questions.  Thank you,  Freddi Starr RN, [place] [facility] Liaison [phone]  [facility] liaisons now on [place].

## 2017-10-16 NOTE — Progress Notes (Signed)
ANTICOAGULATION CONSULT NOTE - f/u Consult  Pharmacy Consult for Heparin Indication: DVT  No Known Allergies  Patient Measurements: Weight: 142 lb (64.4 kg) Heparin Dosing Weight: actual weight  Vital Signs: Temp: 98 F (36.7 C) (05/03 2155) Temp Source: Oral (05/03 2155) BP: 124/70 (05/03 2155) Pulse Rate: 70 (05/03 2155)  Labs: Recent Labs    10/14/17 0406 10/14/17 3662 10/14/17 2254 10/15/17 0605 10/16/17 0407 10/16/17 1249 10/16/17 2201  HGB 8.6*  --   --  8.7* 8.7*  --   --   HCT 26.7*  --   --  27.9* 27.9*  --   --   PLT 123*  --   --  114* 108*  --   --   APTT 132*  --  86* 78*  --   --   --   HEPARINUNFRC 0.93*  --   --  0.34 0.20* 0.29* 0.23*  CREATININE  --  1.00  --  0.83 0.73  --   --     Estimated Creatinine Clearance: 61.5 mL/min (by C-G formula based on SCr of 0.73 mg/dL).   Medical History: Past Medical History:  Diagnosis Date  . Allergy   . Arthritis   . Blood transfusion   . Candidiasis of the esophagus 07-17-2000   EGD  . Cataract    bil eyes  . Colon polyps 05-27-2002   Tubulovillous Adenoma-Colonoscopy  . Diverticulosis of colon (without mention of hemorrhage) 2008,2006,2003   Colonoscopy  . Esophageal reflux 2008   EGD  . Glaucoma   . Hiatal hernia 2002,2003,2013   EGD  . Hyperparathyroidism, unspecified ([place])   . Hypertension   . Internal hemorrhoids without mention of complication 9476   Colonoscopy  . Multiple falls    Pt falls asleep without warning and has encounter multiple falls  . Obstructive sleep apnea (adult) (pediatric)   . Other vitamin B12 deficiency anemia   . Pneumonia    years ago  . Restless legs syndrome (RLS)   . Stricture and stenosis of esophagus 05-27-2002   EGD  . Unspecified asthma(493.90)     Medications:  PTA:  Xarelto 20mg  daily - Last dose noted as 10/12/17 @ 08:43  Assessment:  84 yr male admitted with pleural effusion and pneumonia  PMH significant for LLE DVT and patient on Xarelto.   Upon admission, Xarelto held for possible thoracentesis and pharmacy consulted to dose IV heparin  Baseline INR = 1.3 (Xarelto falsely elevates INR)                      APTT = 39 sec                      Heparin level = 1.34 (falsely elevated due to effects of Xarelto on heparin level) Today, 5/3  0407 HL=0.20 below goal, no bleeding or IV issues per RN.   1249 HL 0.29 below goal, no bleeding or IV issues per RN  2201 HL= 0.23 still below goal!, no bleeding or IV issues per RN.   Goal of Therapy:  Heparin level 0.3-0.7 units/ml aPTT 66-102 seconds Monitor platelets by anticoagulation protocol: Yes   Plan:   Increase heparin drip to 1450 units/hr  Recheck HL in 8 hours  Daily HL and CBC  Dorrene German 10/16/2017, 11:14 PM

## 2017-10-16 NOTE — Progress Notes (Signed)
ANTICOAGULATION CONSULT NOTE - f/u Consult  Pharmacy Consult for Heparin Indication: DVT  No Known Allergies  Patient Measurements: Weight: 164 lb 0.4 oz (74.4 kg) Heparin Dosing Weight: actual weight  Vital Signs: Temp: 97.8 F (36.6 C) (05/02 2125) Temp Source: Oral (05/02 2125) BP: 117/67 (05/02 2125) Pulse Rate: 71 (05/02 2125)  Labs: Recent Labs    10/13/17 1433 10/14/17 0406 10/14/17 7062 10/14/17 2254 10/15/17 0605 10/16/17 0407  HGB 9.8* 8.6*  --   --  8.7*  --   HCT 32.0* 26.7*  --   --  27.9*  --   PLT 138* 123*  --   --  114*  --   APTT 39* 132*  --  86* 78*  --   LABPROT 16.1*  --   --   --   --   --   INR 1.30  --   --   --   --   --   HEPARINUNFRC 1.34* 0.93*  --   --  0.34 0.20*  CREATININE 1.07  --  1.00  --  0.83 0.73    Estimated Creatinine Clearance: 71 mL/min (by C-G formula based on SCr of 0.73 mg/dL).   Medical History: Past Medical History:  Diagnosis Date  . Allergy   . Arthritis   . Blood transfusion   . Candidiasis of the esophagus 07-17-2000   EGD  . Cataract    bil eyes  . Colon polyps 05-27-2002   Tubulovillous Adenoma-Colonoscopy  . Diverticulosis of colon (without mention of hemorrhage) 2008,2006,2003   Colonoscopy  . Esophageal reflux 2008   EGD  . Glaucoma   . Hiatal hernia 2002,2003,2013   EGD  . Hyperparathyroidism, unspecified ([place])   . Hypertension   . Internal hemorrhoids without mention of complication 3762   Colonoscopy  . Multiple falls    Pt falls asleep without warning and has encounter multiple falls  . Obstructive sleep apnea (adult) (pediatric)   . Other vitamin B12 deficiency anemia   . Pneumonia    years ago  . Restless legs syndrome (RLS)   . Stricture and stenosis of esophagus 05-27-2002   EGD  . Unspecified asthma(493.90)     Medications:  PTA:  Xarelto 20mg  daily - Last dose noted as 10/12/17 @ 08:43  Assessment:  84 yr male admitted with pleural effusion and pneumonia  PMH significant  for LLE DVT and patient on Xarelto.  Upon admission, Xarelto held for possible thoracentesis and pharmacy consulted to dose IV heparin  Baseline INR = 1.3 (Xarelto falsely elevates INR)                      APTT = 39 sec                      Heparin level = 1.34 (falsely elevated due to effects of Xarelto on heparin level) Today, 5/3  0407 HL=0.20 below goal, no bleeding or IV issues per RN.   H/H = pending  Goal of Therapy:  Heparin level 0.3-0.7 units/ml aPTT 66-102 seconds Monitor platelets by anticoagulation protocol: Yes   Plan:   Increase heparin drip to 1100 units/hr  Recheck HL in 8 hours  Daily HL and CBC  Dorrene German 10/16/2017, 4:58 AM

## 2017-10-16 NOTE — Progress Notes (Addendum)
PROGRESS NOTE    Derrick Sanchez  JIR:[id number] DOB: 1931/01/30 DOA: 10/13/2017 PCP: [name], MD    Brief Narrative:  Derrick Sanchez is a 84 y.o. male with medical history significant of forspinal epidural abscess, lumbar radiculopathy, paraparesis of lower limbs, chronic osteomyelitis of lumbar spine, frequent falls, malnutrition, failure to thrive who presented to ED [facility] from SNF with complaints of dyspnea and persistent productive cough.  Patient had recent admission and was treated for pneumonia as well as pleural effusion.  Presents with similar complaints  ED Course: Patient was found to have recurring left pleural effusion and possible pneumonia we are consulted for further evaluation and recommendations     Assessment & Plan:   Principal Problem:   Acute on chronic respiratory failure with hypoxia ([place]) Active Problems:   Empyema lung (HCC)   Pleural effusion   HCAP (healthcare-associated pneumonia)   Bacteremia due to Gram-positive bacteria   GASTROESOPHAGEAL REFLUX DISEASE   Protein-calorie malnutrition, severe ([place])   Essential hypertension   Pressure injury of skin   At high risk for aspiration   Pleural effusion on left   Deep vein thrombosis (DVT) of lower extremity ([place])  #1 acute on chronic respiratory failure with hypoxia secondary to healthcare associated pneumonia and recurrent left sided pleural effusion/empyema Patient had presented acute on chronic respiratory failure chest x-ray consistent with healthcare associated pneumonia recurrent left-sided pleural effusion.  Patient recently hospitalized with similar symptoms.  Patient with increased O2 requirements the night of admission patient was requiring nonrebreather.  Some improvement with O2 however patient was on rhonchorous breath sounds.  Patient given fluid bolus and some IV fluids due to borderline blood pressure and patient's diuretics discontinued.  Saline lock IV fluids. Patient status post  left-sided thoracentesis with 200 cc fluid removed.  Body fluids consistent with an exudate and probable empyema.  Patient has had recurrent left-sided pleural effusions times approximately 3 over the past month requiring hospitalizations.  Blood cultures pending with 2/4 with gram-positive bacteremia preliminary results.  Continue empiric IV vancomycin and IV cefepime.  Continue Mucinex, scheduled nebs, PPI, Flonase, Claritin.  Patient received a dose of IV Lasix with some clinical improvement.  Resume home dose oral Lasix. Due to concern for empyema/recurrent left-sided pleural effusions over the past month cardiothoracic surgery was consulted on 10/15/2017 however nobody assessed the patient.  Called and spoke with Dr. Roxan Hockey of cardiothoracic surgery on 10/16/2017 who stated that patient could likely not undergo any surgical procedures at this time, recommended we get a repeat CT chest and recommended that we have pulmonary assess the patient to deal with patient's empyema as they had a presence here at [facility] and possible recommendation of pigtail catheter placement and probable thrombolytics if loculated effusion noted on CT chest.  Will consult with pulmonary for further evaluation and management.  W   2.  Bacteremia due to gram-positive bacteria Preliminary blood cultures 2/4 with gram-positive bacteremia.  Final cultures and sensitivities pending.  Continue empiric IV vancomycin. If  Blood cultures indeed positive for staph will likely need a 2D echo to rule out endocarditis and consultation with ID for further evaluation and management.  3.  Gastroesophageal reflux disease PPI.  4.  Hypertension Blood pressure was borderline and as such diuretics discontinued.  Pressure improved with hydration.  IV fluids have been saline locked.  Patient's antihypertensive medications on hold.  Will resume patient's home dose oral Lasix.  Follow.    5.  Stage II sacral  pressure injury/unstageable  pressure injury of left hip and left ischium/multiple abrasions to lower back and bilateral buttocks/left posterior leg with healing full-thickness wound Patient has been seen by wound care nurse.  Continue current recommended dressing changes.  #6 severe protein calorie malnutrition Continue nutritional supplementation.   7.  Groin candidiasis Continue nystatin powder.   8.  Diastolic heart failure 2D echo done 09/22/2017 with a EF of 60 to 44%, grade 1 diastolic dysfunction.  Diuretics have been discontinued secondary to borderline/low blood pressure.  No signs of volume overload should on admission.  Patient with some hypoxia overnight IV fluids were discontinued and patient given a dose of IV Lasix with good urine output and some clinical improvement.    9.  Chronic low back pain/history of chronic vertebral osteomyelitis Patient on chronic suppressive therapy with Levaquin.  Levaquin not resumed during this hospitalization as patient on broad-spectrum IV antibiotics. Resume Levaquin once acute antibiotics have been discontinued.  Follow.  10.  History of left lower extremity DVT Was on Xarelto at home however due to thoracentesis patient placed on heparin.  Once no further procedures are needed will place back on home regimen of Xarelto.  #11 anemia/iron deficiency anemia Patient with no overt bleeding.  Anemia panel consistent with iron deficiency anemia with iron level of 8 and a ferritin of 107.  B12 level of 783.  Hemoglobin stable at 8.7.  Transfusion threshold hemoglobin less than 7.  Follow H&H for now.  12.  Severe protein calorie malnutrition Nutritional supplementation.  13 prognosis Likely with a poor prognosis multiple admissions over the past month x3 with recurrent pleural effusions and pneumonia, history of spinal epidural abscess on chronic suppressive antibiotics, chronic osteomyelitis of the lumbar spine, failure to thrive, frequent falls, severe protein calorie  malnutrition now with an empyema.  Patient on empiric IV antibiotics.  Palliative care consultation has been done and palliative care following.    DVT prophylaxis: Heparin Code Status: Partial code Family Communication: Updated patient.  No family at bedside.   Disposition Plan: To be determined pending hospitalization.   Consultants:   Interventional radiology  CT surgery pending  Procedures:   Chest x-ray 10/13/2017  Ultrasound-guided diagnostic and therapeutic left thoracentesis with 200 cc removed 10/14/2017 per Rowe Robert, PA/Dr. Barbie Banner  Antimicrobials:   IV cefepime 10/13/2017  Vancomycin 10/13/2017   Subjective: Patient in bed.  Patient feels some improvement with his shortness of breath since last night after receiving a dose of IV Lasix.  No chest pain.  No nausea or vomiting.   Objective: Vitals:   10/16/17 0539 10/16/17 1017 10/16/17 1024 10/16/17 1100  BP: 127/60     Pulse: 64     Resp: 17     Temp: (!) 97.4 F (36.3 C)     TempSrc: Oral     SpO2: (!) 86% 100% 100%   Weight:    64.4 kg (142 lb)    Intake/Output Summary (Last 24 hours) at 10/16/2017 1146 Last data filed at 10/16/2017 0845 Gross per 24 hour  Intake 2457.69 ml  Output 1000 ml  Net 1457.69 ml   Filed Weights   10/14/17 0409 10/16/17 1100  Weight: 74.4 kg (164 lb 0.4 oz) 64.4 kg (142 lb)    Examination:  General exam: Frail.  Chronically ill-appearing.  Cachectic. Respiratory system: Diffuse rhonchorous coarse breath sounds.  Decreased breath sounds in the left base.  No wheezing.  Normal respiratory effort.  Respiratory effort normal. Cardiovascular system: RRR, no murmurs  rubs or gallops.  No lower extremity edema.  No JVD.  Gastrointestinal system: Abdomen is nontender, nondistended, soft, positive bowel sounds.  No rebound.  No guarding.  Central nervous system: Alert and oriented. No focal neurological deficits. Extremities: Symmetric 5 x 5 power. Skin: Stage II pressure injury  sacrum, previous pressure injury stage III left outer ankle/unstageable pressure injury left hip/left ischium with unstageable pressure injury/left posterior leg with posterior full-thickness wound. Psychiatry: Judgement and insight appear normal. Mood & affect appropriate.     Data Reviewed: I have personally reviewed following labs and imaging studies  CBC: Recent Labs  Lab 10/13/17 1433 10/14/17 0406 10/15/17 0605 10/16/17 0407  WBC 9.3 10.5 8.7 7.0  NEUTROABS 8.0*  --  7.3  --   HGB 9.8* 8.6* 8.7* 8.7*  HCT 32.0* 26.7* 27.9* 27.9*  MCV 90.4 88.7 90.0 89.4  PLT 138* 123* 114* 443*   Basic Metabolic Panel: Recent Labs  Lab 10/13/17 1433 10/14/17 0938 10/15/17 0605 10/16/17 0407  NA 138 139 137 138  K 3.8 3.0* 3.8 3.6  CL 103 106 107 107  CO2 26 24 24 23   GLUCOSE 88 124* 94 81  BUN 29* 26* 24* 21*  CREATININE 1.07 1.00 0.83 0.73  CALCIUM 7.6* 7.2* 7.2* 7.4*  MG  --  1.7 2.4 2.1   GFR: Estimated Creatinine Clearance: 61.5 mL/min (by C-G formula based on SCr of 0.73 mg/dL). Liver Function Tests: Recent Labs  Lab 10/14/17 0938  AST 25  ALT 17  ALKPHOS 68  BILITOT 0.5  PROT 5.1*  ALBUMIN 1.6*   No results for input(s): LIPASE, AMYLASE in the last 168 hours. No results for input(s): AMMONIA in the last 168 hours. Coagulation Profile: Recent Labs  Lab 10/13/17 1433  INR 1.30   Cardiac Enzymes: No results for input(s): CKTOTAL, CKMB, CKMBINDEX, TROPONINI in the last 168 hours. BNP (last 3 results) No results for input(s): PROBNP in the last 8760 hours. HbA1C: No results for input(s): HGBA1C in the last 72 hours. CBG: Recent Labs  Lab 10/14/17 0726  GLUCAP 81   Lipid Profile: No results for input(s): CHOL, HDL, LDLCALC, TRIG, CHOLHDL, LDLDIRECT in the last 72 hours. Thyroid Function Tests: No results for input(s): TSH, T4TOTAL, FREET4, T3FREE, THYROIDAB in the last 72 hours. Anemia Panel: Recent Labs    10/14/17 0938  VITAMINB12 783  FOLATE  22.4  FERRITIN 107  TIBC 137*  IRON 8*  RETICCTPCT 2.5   Sepsis Labs: Recent Labs  Lab 10/13/17 1448  LATICACIDVEN 1.46    Recent Results (from the past 240 hour(s))  Culture, blood (routine x 2)     Status: Abnormal   Collection Time: 10/13/17  2:33 PM  Result Value Ref Range Status   Specimen Description   Final    BLOOD LEFT ANTECUBITAL Performed at [facility] [address] [place], [place] [address]    Special Requests   Final    BOTTLES DRAWN AEROBIC AND ANAEROBIC Blood Culture adequate volume Performed at [facility] [address], [place], [place] [address]    Culture  Setup Time   Final    GRAM POSITIVE COCCI IN CLUSTERS IN BOTH AEROBIC AND ANAEROBIC BOTTLES CRITICAL RESULT CALLED TO, READ BACK BY AND VERIFIED WITH: Melodye Ped PharmD 13:35 10/14/17 ([email or web address])    Culture (A)  Final    STAPHYLOCOCCUS SPECIES (COAGULASE NEGATIVE) THE SIGNIFICANCE OF ISOLATING THIS ORGANISM FROM A SINGLE SET OF BLOOD CULTURES WHEN MULTIPLE SETS ARE  DRAWN IS UNCERTAIN. PLEASE NOTIFY THE MICROBIOLOGY DEPARTMENT WITHIN ONE WEEK IF SPECIATION AND SENSITIVITIES ARE REQUIRED. Performed at [facility], [place] [address] [place], [place] [address]    Report Status 10/16/2017 FINAL  Final  Blood Culture ID Panel (Reflexed)     Status: Abnormal   Collection Time: 10/13/17  2:33 PM  Result Value Ref Range Status   Enterococcus species NOT DETECTED NOT DETECTED Final   Listeria monocytogenes NOT DETECTED NOT DETECTED Final   Staphylococcus species DETECTED (A) NOT DETECTED Final    Comment: Methicillin (oxacillin) resistant coagulase negative staphylococcus. Possible blood culture contaminant (unless isolated from more than one blood culture draw or clinical case suggests pathogenicity). No antibiotic treatment is indicated for blood  culture contaminants. CRITICAL RESULT CALLED TO, READ BACK BY AND VERIFIED WITH: Melodye Ped PharmD 13:35 10/14/17  ([email or web address])    Staphylococcus aureus NOT DETECTED NOT DETECTED Final   Methicillin resistance DETECTED (A) NOT DETECTED Final    Comment: CRITICAL RESULT CALLED TO, READ BACK BY AND VERIFIED WITH: Melodye Ped PharmD 13:35 10/14/17 ([email or web address])    Streptococcus species NOT DETECTED NOT DETECTED Final   Streptococcus agalactiae NOT DETECTED NOT DETECTED Final   Streptococcus pneumoniae NOT DETECTED NOT DETECTED Final   Streptococcus pyogenes NOT DETECTED NOT DETECTED Final   Acinetobacter baumannii NOT DETECTED NOT DETECTED Final   Enterobacteriaceae species NOT DETECTED NOT DETECTED Final   Enterobacter cloacae complex NOT DETECTED NOT DETECTED Final   Escherichia coli NOT DETECTED NOT DETECTED Final   Klebsiella oxytoca NOT DETECTED NOT DETECTED Final   Klebsiella pneumoniae NOT DETECTED NOT DETECTED Final   Proteus species NOT DETECTED NOT DETECTED Final   Serratia marcescens NOT DETECTED NOT DETECTED Final   Haemophilus influenzae NOT DETECTED NOT DETECTED Final   Neisseria meningitidis NOT DETECTED NOT DETECTED Final   Pseudomonas aeruginosa NOT DETECTED NOT DETECTED Final   Candida albicans NOT DETECTED NOT DETECTED Final   Candida glabrata NOT DETECTED NOT DETECTED Final   Candida krusei NOT DETECTED NOT DETECTED Final   Candida parapsilosis NOT DETECTED NOT DETECTED Final   Candida tropicalis NOT DETECTED NOT DETECTED Final  Culture, blood (routine x 2)     Status: None (Preliminary result)   Collection Time: 10/13/17  3:43 PM  Result Value Ref Range Status   Specimen Description   Final    BLOOD LEFT HAND Performed at [facility], [address], [place], [place] [address]    Special Requests   Final    BOTTLES DRAWN AEROBIC AND ANAEROBIC Blood Culture adequate volume Performed at [facility] [address], [place], [place] [address]    Culture   Final    NO GROWTH 2 DAYS Performed at [facility] [address], [place], [place] [address]      Report Status PENDING  Incomplete  Culture, sputum-assessment     Status: None   Collection Time: 10/13/17 10:12 PM  Result Value Ref Range Status   Specimen Description EXPECTORATED SPUTUM  Final   Special Requests Immunocompromised  Final   Sputum evaluation   Final    THIS SPECIMEN IS ACCEPTABLE FOR SPUTUM CULTURE Performed at [facility], [place] [address], [place], [place] [address]    Report Status 10/13/2017 FINAL  Final  Culture, respiratory (NON-Expectorated)     Status: None   Collection Time: 10/13/17 10:12 PM  Result Value Ref Range Status   Specimen Description   Final  EXPECTORATED SPUTUM Performed at [facility], [place] [address] [place], [place] [address]    Special Requests   Final    Immunocompromised Reflexed from [phone] Performed at [facility], [place] [address] [place], [place] [address]    Gram Stain   Final    ABUNDANT WBC PRESENT, PREDOMINANTLY PMN FEW GRAM POSITIVE COCCI FEW GRAM POSITIVE RODS FEW GRAM NEGATIVE COCCOBACILLI RARE YEAST    Culture   Final    MODERATE Consistent with normal respiratory flora. Performed at [facility], [place] [address], [place], [place] [address]    Report Status 10/16/2017 FINAL  Final  Body fluid culture     Status: None (Preliminary result)   Collection Time: 10/14/17  1:51 PM  Result Value Ref Range Status   Specimen Description   Final    PLEURAL RIGHT Performed at [facility] [address], [place], [place] [address]    Special Requests   Final    NONE Performed at [facility], [place] [address], [place], [place] [address]    Gram Stain   Final    ABUNDANT WBC PRESENT,BOTH PMN AND MONONUCLEAR NO ORGANISMS SEEN    Culture   Final    NO GROWTH 2 DAYS Performed at [facility], [place] [address], [place], [place] [address]    Report Status PENDING  Incomplete         Radiology Studies: Dg Chest  1 View  Result Date: 10/14/2017 CLINICAL DATA:  85 year old male status post ultrasound-guided left thoracentesis today. EXAM: CHEST  1 VIEW COMPARISON:  Left chest thoracentesis images 1218 hours. Portable chest 10/13/2017 and earlier. FINDINGS: Portable AP semi upright view at 8[address]40 hours. Decreased veiling opacity in the left lower lung, mildly improved left lung base ventilation, and no pneumothorax identified following thoracentesis. Continued veiling opacity in the right lower lung, right lung base ventilation has worsened since 09/30/2017. No pneumothorax or pulmonary edema. Stable cardiac size and mediastinal contours. Visualized tracheal air column is within normal limits. Stable visible bowel gas pattern. IMPRESSION: 1. Mildly improved ventilation and no adverse features identified following left side thoracentesis. 2. Right side pleural effusion with lower lobe collapse or consolidation has progressed since 09/30/2017. Electronically Signed   By: Genevie Ann M.D.   On: 10/14/2017 14:07   US Thoracentesis Asp Pleural Space W/img Guide  Result Date: 10/14/2017 INDICATION: Patient with history of pneumonia, heart failure, failure to thrive, bilateral pleural effusions; request made for diagnostic and therapeutic left thoracentesis. EXAM: ULTRASOUND GUIDED DIAGNOSTIC AND THERAPEUTIC LEFT THORACENTESIS MEDICATIONS: None COMPLICATIONS: None immediate. PROCEDURE: An ultrasound guided thoracentesis was thoroughly discussed with the patient and questions answered. The benefits, risks, alternatives and complications were also discussed. The patient understands and wishes to proceed with the procedure. Written consent was obtained. Ultrasound was performed to localize and mark an adequate pocket of fluid in the left chest. The area was then prepped and draped in the normal sterile fashion. 1% Lidocaine was used for local anesthesia. Under ultrasound guidance a 6 Fr Safe-T-Centesis catheter was introduced.  Thoracentesis was performed. The catheter was removed and a dressing applied. FINDINGS: A total of approximately 200 cc of slightly hazy, yellow fluid was removed. Samples were sent to the laboratory as requested by the clinical team. IMPRESSION: Successful ultrasound guided diagnostic and therapeutic left thoracentesis yielding 200 cc of pleural fluid. Follow-up chest x-ray revealed no pneumothorax. Read by: Rowe Robert, PA-C Electronically Signed  By: Marybelle Killings M.D.   On: 10/14/2017 14:11        Scheduled Meds: . budesonide (PULMICORT) nebulizer solution  0.5 mg Nebulization BID  . feeding supplement (ENSURE ENLIVE)  237 mL Oral BID BM  . ferrous sulfate  325 mg Oral Daily  . fluticasone  2 spray Each Nare Daily  . gabapentin  300 mg Oral BID  . guaiFENesin  1,200 mg Oral BID  . ipratropium-albuterol  3 mL Nebulization Once  . ipratropium-albuterol  3 mL Nebulization TID  . latanoprost  1 drop Both Eyes QHS  . loratadine  10 mg Oral Daily  . multivitamin with minerals  1 tablet Oral Daily  . nystatin   Topical TID  . pantoprazole  40 mg Oral Daily  . pramipexole  1 mg Oral TID  . senna-docusate  2 tablet Oral QHS  . silver sulfADIAZINE   Topical Daily  . sodium chloride flush  3 mL Intravenous Q12H   Continuous Infusions: . sodium chloride 250 mL (10/13/17 1954)  . ceFEPime (MAXIPIME) IV Stopped (10/16/17 0552)  . heparin 1,100 Units/hr (10/16/17 0511)  . vancomycin Stopped (10/15/17 2331)     LOS: 3 days    Time spent: 35 minutes    Irine Seal, MD [facility] Pager [phone]  If 7PM-7AM, please contact night-coverage [email or web address] Password [email or web address] 10/16/2017, 11:46 AM

## 2017-10-17 ENCOUNTER — Inpatient Hospital Stay (HOSPITAL_COMMUNITY): Payer: PPO

## 2017-10-17 LAB — CBC
HCT: 28.8 % — ABNORMAL LOW (ref 39.0–52.0)
HEMOGLOBIN: 9.2 g/dL — AB (ref 13.0–17.0)
MCH: 28.6 pg (ref 26.0–34.0)
MCHC: 31.9 g/dL (ref 30.0–36.0)
MCV: 89.4 fL (ref 78.0–100.0)
Platelets: 103 10*3/uL — ABNORMAL LOW (ref 150–400)
RBC: 3.22 MIL/uL — ABNORMAL LOW (ref 4.22–5.81)
RDW: 19.4 % — ABNORMAL HIGH (ref 11.5–15.5)
WBC: 5.5 10*3/uL (ref 4.0–10.5)

## 2017-10-17 LAB — BASIC METABOLIC PANEL
Anion gap: 11 (ref 5–15)
BUN: 18 mg/dL (ref 6–20)
CALCIUM: 7.5 mg/dL — AB (ref 8.9–10.3)
CO2: 21 mmol/L — AB (ref 22–32)
Chloride: 103 mmol/L (ref 101–111)
Creatinine, Ser: 0.7 mg/dL (ref 0.61–1.24)
GFR calc Af Amer: >60 mL/min (ref >60)
GFR calc non Af Amer: >60 mL/min (ref >60)
GLUCOSE: 73 mg/dL (ref 65–99)
POTASSIUM: 3.6 mmol/L (ref 3.5–5.1)
Sodium: 135 mmol/L (ref 135–145)

## 2017-10-17 LAB — BODY FLUID CULTURE: Culture: NO GROWTH

## 2017-10-17 LAB — HEPARIN LEVEL (UNFRACTIONATED)
Heparin Unfractionated: 0.55 IU/mL (ref 0.30–0.70)
Heparin Unfractionated: 0.57 IU/mL (ref 0.30–0.70)

## 2017-10-17 MED ORDER — FUROSEMIDE 20 MG PO TABS
20.0000 mg | ORAL_TABLET | Freq: Two times a day (BID) | ORAL | Status: DC
  Administered 2017-10-18: 20 mg via ORAL
  Filled 2017-10-17: qty 1

## 2017-10-17 MED ORDER — FUROSEMIDE 10 MG/ML IJ SOLN
20.0000 mg | Freq: Once | INTRAMUSCULAR | Status: AC
  Administered 2017-10-17: 20 mg via INTRAVENOUS
  Filled 2017-10-17: qty 2

## 2017-10-17 MED ORDER — PANTOPRAZOLE SODIUM 40 MG PO TBEC
40.0000 mg | DELAYED_RELEASE_TABLET | Freq: Two times a day (BID) | ORAL | Status: DC
  Administered 2017-10-17 – 2017-10-20 (×6): 40 mg via ORAL
  Filled 2017-10-17 (×6): qty 1

## 2017-10-17 MED ORDER — SODIUM CHLORIDE 0.9 % IJ SOLN
INTRAMUSCULAR | Status: AC
  Filled 2017-10-17: qty 50

## 2017-10-17 MED ORDER — IOHEXOL 300 MG/ML  SOLN
75.0000 mL | Freq: Once | INTRAMUSCULAR | Status: AC | PRN
  Administered 2017-10-17: 75 mL via INTRAVENOUS

## 2017-10-17 NOTE — Progress Notes (Signed)
ANTICOAGULATION CONSULT NOTE - f/u Consult  Pharmacy Consult for Heparin Indication: DVT  No Known Allergies  Patient Measurements: Weight: 153 lb (69.4 kg) Heparin Dosing Weight: actual weight  Vital Signs: Temp: 97.7 F (36.5 C) (05/04 0551) Temp Source: Oral (05/04 0551) BP: 122/70 (05/04 0551) Pulse Rate: 60 (05/04 0551)  Labs: Recent Labs    10/14/17 2254  10/15/17 1829 10/16/17 0407 10/16/17 1249 10/16/17 2201 10/17/17 0357 10/17/17 0750  HGB  --    < > 8.7* 8.7*  --   --  9.2*  --   HCT  --   --  27.9* 27.9*  --   --  28.8*  --   PLT  --   --  114* 108*  --   --  103*  --   APTT 86*  --  78*  --   --   --   --   --   HEPARINUNFRC  --    < > 0.34 0.20* 0.29* 0.23*  --  0.55  CREATININE  --   --  0.83 0.73  --   --  0.70  --    < > = values in this interval not displayed.   Estimated Creatinine Clearance: 66.3 mL/min (by C-G formula based on SCr of 0.7 mg/dL).   Medical History: Past Medical History:  Diagnosis Date  . Allergy   . Arthritis   . Blood transfusion   . Candidiasis of the esophagus 07-17-2000   EGD  . Cataract    bil eyes  . Colon polyps 05-27-2002   Tubulovillous Adenoma-Colonoscopy  . Diverticulosis of colon (without mention of hemorrhage) 2008,2006,2003   Colonoscopy  . Esophageal reflux 2008   EGD  . Glaucoma   . Hiatal hernia 2002,2003,2013   EGD  . Hyperparathyroidism, unspecified ([place])   . Hypertension   . Internal hemorrhoids without mention of complication 9371   Colonoscopy  . Multiple falls    Pt falls asleep without warning and has encounter multiple falls  . Obstructive sleep apnea (adult) (pediatric)   . Other vitamin B12 deficiency anemia   . Pneumonia    years ago  . Restless legs syndrome (RLS)   . Stricture and stenosis of esophagus 05-27-2002   EGD  . Unspecified asthma(493.90)    Medications:  PTA:  Xarelto 20mg  daily - Last dose noted as 10/12/17 @ 08:43  Assessment:  84 yr male admitted with pleural  effusion and pneumonia  PMH significant for LLE DVT and patient on Xarelto.  Upon admission, Xarelto held for possible thoracentesis and pharmacy consulted to dose IV heparin  Baseline INR = 1.3 (Xarelto falsely elevates INR)                      APTT = 39 sec                      Heparin level = 1.34 (falsely elevated due to effects of Xarelto on heparin level) Today, 5/3  Heparin level 0.55 units/ml, in therapeutic range, on 1450 units/hr  Required two rate increases to reach therapeutic range  Goal of Therapy:  Heparin level 0.3-0.7 units/ml aPTT 66-102 seconds Monitor platelets by anticoagulation protocol: Yes   Plan:   Continue heparin drip at 1450 units/hr  Recheck HL in 8 hours (1800)  Daily HL and CBC  Minda Ditto PharmD Pager [phone] 10/17/2017, 10:22 AM

## 2017-10-17 NOTE — Progress Notes (Signed)
Hospice and Palliative Care and [facility]) Hospital Liaison Visit  Follow up with Son, [name] re: goals of care and discharge plans with hospice. The current plan is for patient to go home with hospice at discharge. Patient is currently not [employer] appropriate. Patient is not planned for discharge today. [place] will follow up with son 10/18/17 to discuss possible discharge date and order DME.  Please feel free to contact with any hospice-related questions.  Thank you,  Farrel Gordon, RN, [facility] Liaison ([facility] liaisons now on [place].

## 2017-10-17 NOTE — Progress Notes (Signed)
ANTICOAGULATION CONSULT NOTE - f/u Consult  Pharmacy Consult for Heparin Indication: DVT  No Known Allergies  Patient Measurements: Weight: 153 lb (69.4 kg) Heparin Dosing Weight: actual weight  Vital Signs: Temp: 97.7 F (36.5 C) (05/04 1947) Temp Source: Oral (05/04 1947) BP: 122/78 (05/04 1947) Pulse Rate: 78 (05/04 1947)  Labs: Recent Labs    10/14/17 2254  10/15/17 3888 10/16/17 0407  10/16/17 2201 10/17/17 0357 10/17/17 0750 10/17/17 1801  HGB  --    < > 8.7* 8.7*  --   --  9.2*  --   --   HCT  --   --  27.9* 27.9*  --   --  28.8*  --   --   PLT  --   --  114* 108*  --   --  103*  --   --   APTT 86*  --  78*  --   --   --   --   --   --   HEPARINUNFRC  --    < > 0.34 0.20*   < > 0.23*  --  0.55 0.57  CREATININE  --   --  0.83 0.73  --   --  0.70  --   --    < > = values in this interval not displayed.   Estimated Creatinine Clearance: 66.3 mL/min (by C-G formula based on SCr of 0.7 mg/dL).   Medical History: Past Medical History:  Diagnosis Date  . Allergy   . Arthritis   . Blood transfusion   . Candidiasis of the esophagus 07-17-2000   EGD  . Cataract    bil eyes  . Colon polyps 05-27-2002   Tubulovillous Adenoma-Colonoscopy  . Diverticulosis of colon (without mention of hemorrhage) 2008,2006,2003   Colonoscopy  . Esophageal reflux 2008   EGD  . Glaucoma   . Hiatal hernia 2002,2003,2013   EGD  . Hyperparathyroidism, unspecified ([place])   . Hypertension   . Internal hemorrhoids without mention of complication 2800   Colonoscopy  . Multiple falls    Pt falls asleep without warning and has encounter multiple falls  . Obstructive sleep apnea (adult) (pediatric)   . Other vitamin B12 deficiency anemia   . Pneumonia    years ago  . Restless legs syndrome (RLS)   . Stricture and stenosis of esophagus 05-27-2002   EGD  . Unspecified asthma(493.90)    Medications:  PTA:  Xarelto 20mg  daily - Last dose noted as 10/12/17 @ 08:43  Assessment:  84 yr  male admitted with pleural effusion and pneumonia  PMH significant for LLE DVT and patient on Xarelto.  Upon admission, Xarelto held for possible thoracentesis and pharmacy consulted to dose IV heparin  Baseline INR = 1.3 (Xarelto falsely elevates INR)                      APTT = 39 sec                      Heparin level = 1.34 (falsely elevated due to effects of Xarelto on heparin level)  Today, 5/3  Repeat heparin level therapeutic at 0.57 on heparin at 1450 units/hr  CBC: Hgb decreased/stable, pltc decreased/stable  No known bleeding issues  Goal of Therapy:  Heparin level 0.3-0.7 units/ml aPTT 66-102 seconds Monitor platelets by anticoagulation protocol: Yes   Plan:   Continue heparin drip at 1450 units/hr  Daily HL and CBC  Doreene Eland, PharmD,  BCPS.   Pager: [phone] 10/17/2017 7:50 PM

## 2017-10-17 NOTE — Progress Notes (Signed)
PROGRESS NOTE    Derrick Sanchez  OFB:[id number] DOB: Jun 28, 1930 DOA: 10/13/2017 PCP: [name], MD    Brief Narrative:  Derrick Sanchez is a 84 y.o. male with medical history significant of forspinal epidural abscess, lumbar radiculopathy, paraparesis of lower limbs, chronic osteomyelitis of lumbar spine, frequent falls, malnutrition, failure to thrive who presented to ED [facility] from SNF with complaints of dyspnea and persistent productive cough.  Patient had recent admission and was treated for pneumonia as well as pleural effusion.  Presents with similar complaints  ED Course: Patient was found to have recurring left pleural effusion and possible pneumonia we are consulted for further evaluation and recommendations     Assessment & Plan:   Principal Problem:   Acute on chronic respiratory failure with hypoxia ([place]) Active Problems:   Empyema lung (HCC)   Pleural effusion   HCAP (healthcare-associated pneumonia)   Bacteremia due to Gram-positive bacteria   GASTROESOPHAGEAL REFLUX DISEASE   Protein-calorie malnutrition, severe ([place])   Essential hypertension   Pressure injury of skin   At high risk for aspiration   Pleural effusion on left   Deep vein thrombosis (DVT) of lower extremity ([place])  #1 acute on chronic respiratory failure with hypoxia secondary to healthcare associated pneumonia and recurrent left sided pleural effusion/empyema Patient had presented acute on chronic respiratory failure chest x-ray consistent with healthcare associated pneumonia recurrent left-sided pleural effusion.  Patient recently hospitalized with similar symptoms.  Patient with increased O2 requirements the night of admission patient was requiring nonrebreather.  Hypoxia improving and rhonchorous breath sounds slowly improved with couple doses of IV diuretics.  Patient now on home dose oral Lasix.  Patient given fluid bolus and some IV fluids early on in the hospitalization, due to borderline blood  pressure and patient's diuretics discontinued.  Saline lock IV fluids. Patient status post left-sided thoracentesis with 200 cc fluid removed.  Body fluids consistent with an exudate and probable empyema.  Patient has had recurrent left-sided pleural effusions times approximately 3 over the past month requiring hospitalizations.  Blood cultures pending with 2/4 with gram-positive bacteremia preliminary results.  Continue empiric IV vancomycin and IV cefepime.  Continue Mucinex, scheduled nebs, PPI, Flonase, Claritin.  Patient received a dose of IV Lasix with some clinical improvement.  Resume home dose oral Lasix. Due to concern for empyema/recurrent left-sided pleural effusions over the past month cardiothoracic surgery was consulted on 10/15/2017 however nobody assessed the patient.  Called and spoke with Dr. Roxan Hockey of cardiothoracic surgery on 10/16/2017 who stated that patient could likely not undergo any surgical procedures at this time, recommended we get a repeat CT chest and recommended that we have pulmonary assess the patient to deal with patient's empyema as they had a presence here at [facility] and possible recommendation of pigtail catheter placement and probable thrombolytics if loculated effusion noted on CT chest.  Patient seen in consultation by pulmonary on 10/16/2017 who is in agreement with CT chest and further recommendations pending CT chest results.  Pulmonary following and appreciate their input and recommendations.   2.  Bacteremia due to gram-positive bacteria Preliminary blood cultures 2/4 with gram-positive bacteremia/coagulase-negative Staphylococcus..  Continue empiric IV vancomycin.  3.  Gastroesophageal reflux disease Continue PPI.  4.  Hypertension Blood pressure was borderline and as such diuretics were initially discontinued.  Blood pressure has improved.  IV fluids saline lock.  Patient's home dose Lasix has been resumed.  Continue to hold other antihypertensive  medications.  5.  Stage II sacral pressure injury/unstageable pressure injury of left hip and left ischium/multiple abrasions to lower back and bilateral buttocks/left posterior leg with healing full-thickness wound Patient has been seen by wound care nurse.  Continue current recommended dressing changes.  #6 severe protein calorie malnutrition Continue nutritional supplementation.   7.  Groin candidiasis Nystatin powder.  8.  Diastolic heart failure 2D echo done 09/22/2017 with a EF of 60 to 69%, grade 1 diastolic dysfunction.  Diuretics have been discontinued secondary to borderline/low blood pressure.  No signs of volume overload noted on admission.  Patient with improvement with hypoxia after IV fluids were discontinued and patient given a couple doses of IV Lasix.  Patient currently on home regimen of oral Lasix.  Patient with a urine output of 2.325 L over the past 24 hours.    9.  Chronic low back pain/history of chronic vertebral osteomyelitis Patient on chronic suppressive therapy with Levaquin.  Levaquin not resumed during this hospitalization as patient on broad-spectrum IV antibiotics. Resume Levaquin once acute antibiotics have been discontinued.  Follow.  10.  History of left lower extremity DVT Was on Xarelto at home however due to thoracentesis patient placed on heparin.  Once no further procedures are needed Xarelto may be resumed.   #11 anemia/iron deficiency anemia Patient with no overt bleeding.  Anemia panel consistent with iron deficiency anemia with iron level of 8 and a ferritin of 107.  B12 level of 783.  Hemoglobin stable at 9.2.  Transfusion threshold hemoglobin less than 7.  Follow H&H for now.  12.  Severe protein calorie malnutrition Nutritional supplementation.  13 prognosis Likely with a poor prognosis multiple admissions over the past month x3 with recurrent pleural effusions and pneumonia, history of spinal epidural abscess on chronic suppressive  antibiotics, chronic osteomyelitis of the lumbar spine, failure to thrive, frequent falls, severe protein calorie malnutrition now with an empyema.  Patient on empiric IV antibiotics.  Palliative care consultation has been done and palliative care following.  Patient is also being followed by hospice liason.    DVT prophylaxis: Heparin Code Status: Partial code Family Communication: Updated patient and wife and niece at bedside. Disposition Plan: To be determined pending hospitalization.  Likely home with home health versus hospice following once clinically stable and cleared by pulmonary.   Consultants:   Interventional radiology  Pulmonary: Dr.Wert 10/16/2017  Procedures:   Chest x-ray 10/13/2017  Ultrasound-guided diagnostic and therapeutic left thoracentesis with 200 cc removed 10/14/2017 per Rowe Robert, PA/Dr. Barbie Banner  Antimicrobials:   IV cefepime 10/13/2017  Vancomycin 10/13/2017   Subjective: Patient states he is feeling better.  Shortness of breath improving.  Cough improving.  No chest pain or shortness of breath.   Objective: Vitals:   10/17/17 0551 10/17/17 0653 10/17/17 0834 10/17/17 1318  BP: 122/70   129/67  Pulse: 60   (!) 59  Resp: 18   16  Temp: 97.7 F (36.5 C)   97.6 F (36.4 C)  TempSrc: Oral   Oral  SpO2: 93%  97% 98%  Weight:  69.4 kg (153 lb)      Intake/Output Summary (Last 24 hours) at 10/17/2017 1623 Last data filed at 10/17/2017 0847 Gross per 24 hour  Intake 1048.14 ml  Output 2325 ml  Net -1276.86 ml   Filed Weights   10/14/17 0409 10/16/17 1100 10/17/17 0653  Weight: 74.4 kg (164 lb 0.4 oz) 64.4 kg (142 lb) 69.4 kg (153 lb)    Examination:  General exam: Frail.  Chronically ill-appearing.  Cachectic. Respiratory system: Less diffuse rhonchorous breath sounds.  Decreased breath sounds in the left base.  Respiratory effort normal. Cardiovascular system: Regular rate and rhythm no murmurs rubs or gallops.  No lower extremity edema.  No  JVD.  Gastrointestinal system: Abdomen is soft, nontender, nondistended, positive bowel sounds.  No rebound.  No guarding.  Central nervous system: Alert and oriented. No focal neurological deficits. Extremities: Symmetric 5 x 5 power. Skin: Stage II pressure injury sacrum, previous pressure injury stage III left outer ankle/unstageable pressure injury left hip/left ischium with unstageable pressure injury/left posterior leg with posterior full-thickness wound. Psychiatry: Judgement and insight appear fair. Mood & affect appropriate.     Data Reviewed: I have personally reviewed following labs and imaging studies  CBC: Recent Labs  Lab 10/13/17 1433 10/14/17 0406 10/15/17 0605 10/16/17 0407 10/17/17 0357  WBC 9.3 10.5 8.7 7.0 5.5  NEUTROABS 8.0*  --  7.3  --   --   HGB 9.8* 8.6* 8.7* 8.7* 9.2*  HCT 32.0* 26.7* 27.9* 27.9* 28.8*  MCV 90.4 88.7 90.0 89.4 89.4  PLT 138* 123* 114* 108* 938*   Basic Metabolic Panel: Recent Labs  Lab 10/13/17 1433 10/14/17 0938 10/15/17 0605 10/16/17 0407 10/17/17 0357  NA 138 139 137 138 135  K 3.8 3.0* 3.8 3.6 3.6  CL 103 106 107 107 103  CO2 26 24 24 23  21*  GLUCOSE 88 124* 94 81 73  BUN 29* 26* 24* 21* 18  CREATININE 1.07 1.00 0.83 0.73 0.70  CALCIUM 7.6* 7.2* 7.2* 7.4* 7.5*  MG  --  1.7 2.4 2.1  --    GFR: Estimated Creatinine Clearance: 66.3 mL/min (by C-G formula based on SCr of 0.7 mg/dL). Liver Function Tests: Recent Labs  Lab 10/14/17 0938  AST 25  ALT 17  ALKPHOS 68  BILITOT 0.5  PROT 5.1*  ALBUMIN 1.6*   No results for input(s): LIPASE, AMYLASE in the last 168 hours. No results for input(s): AMMONIA in the last 168 hours. Coagulation Profile: Recent Labs  Lab 10/13/17 1433  INR 1.30   Cardiac Enzymes: No results for input(s): CKTOTAL, CKMB, CKMBINDEX, TROPONINI in the last 168 hours. BNP (last 3 results) No results for input(s): PROBNP in the last 8760 hours. HbA1C: No results for input(s): HGBA1C in the  last 72 hours. CBG: Recent Labs  Lab 10/14/17 0726  GLUCAP 81   Lipid Profile: No results for input(s): CHOL, HDL, LDLCALC, TRIG, CHOLHDL, LDLDIRECT in the last 72 hours. Thyroid Function Tests: No results for input(s): TSH, T4TOTAL, FREET4, T3FREE, THYROIDAB in the last 72 hours. Anemia Panel: No results for input(s): VITAMINB12, FOLATE, FERRITIN, TIBC, IRON, RETICCTPCT in the last 72 hours. Sepsis Labs: Recent Labs  Lab 10/13/17 1448  LATICACIDVEN 1.46    Recent Results (from the past 240 hour(s))  Culture, blood (routine x 2)     Status: Abnormal   Collection Time: 10/13/17  2:33 PM  Result Value Ref Range Status   Specimen Description   Final    BLOOD LEFT ANTECUBITAL Performed at [place] [address], [place], [place] [address]    Special Requests   Final    BOTTLES DRAWN AEROBIC AND ANAEROBIC Blood Culture adequate volume Performed at [place] [address], [place], [place] [address]    Culture  Setup Time   Final    GRAM POSITIVE COCCI IN CLUSTERS IN BOTH AEROBIC AND ANAEROBIC BOTTLES CRITICAL RESULT CALLED TO, READ  BACK BY AND VERIFIED WITH: Melodye Ped PharmD 13:35 10/14/17 ([email or web address])    Culture (A)  Final    STAPHYLOCOCCUS SPECIES (COAGULASE NEGATIVE) THE SIGNIFICANCE OF ISOLATING THIS ORGANISM FROM A SINGLE SET OF BLOOD CULTURES WHEN MULTIPLE SETS ARE DRAWN IS UNCERTAIN. PLEASE NOTIFY THE MICROBIOLOGY DEPARTMENT WITHIN ONE WEEK IF SPECIATION AND SENSITIVITIES ARE REQUIRED. Performed at [facility], [place] [address], [place], [place] [address]    Report Status 10/16/2017 FINAL  Final  Blood Culture ID Panel (Reflexed)     Status: Abnormal   Collection Time: 10/13/17  2:33 PM  Result Value Ref Range Status   Enterococcus species NOT DETECTED NOT DETECTED Final   Listeria monocytogenes NOT DETECTED NOT DETECTED Final   Staphylococcus species DETECTED (A) NOT DETECTED Final    Comment: Methicillin (oxacillin)  resistant coagulase negative staphylococcus. Possible blood culture contaminant (unless isolated from more than one blood culture draw or clinical case suggests pathogenicity). No antibiotic treatment is indicated for blood  culture contaminants. CRITICAL RESULT CALLED TO, READ BACK BY AND VERIFIED WITH: Melodye Ped PharmD 13:35 10/14/17 ([email or web address])    Staphylococcus aureus NOT DETECTED NOT DETECTED Final   Methicillin resistance DETECTED (A) NOT DETECTED Final    Comment: CRITICAL RESULT CALLED TO, READ BACK BY AND VERIFIED WITH: Melodye Ped PharmD 13:35 10/14/17 ([email or web address])    Streptococcus species NOT DETECTED NOT DETECTED Final   Streptococcus agalactiae NOT DETECTED NOT DETECTED Final   Streptococcus pneumoniae NOT DETECTED NOT DETECTED Final   Streptococcus pyogenes NOT DETECTED NOT DETECTED Final   Acinetobacter baumannii NOT DETECTED NOT DETECTED Final   Enterobacteriaceae species NOT DETECTED NOT DETECTED Final   Enterobacter cloacae complex NOT DETECTED NOT DETECTED Final   Escherichia coli NOT DETECTED NOT DETECTED Final   Klebsiella oxytoca NOT DETECTED NOT DETECTED Final   Klebsiella pneumoniae NOT DETECTED NOT DETECTED Final   Proteus species NOT DETECTED NOT DETECTED Final   Serratia marcescens NOT DETECTED NOT DETECTED Final   Haemophilus influenzae NOT DETECTED NOT DETECTED Final   Neisseria meningitidis NOT DETECTED NOT DETECTED Final   Pseudomonas aeruginosa NOT DETECTED NOT DETECTED Final   Candida albicans NOT DETECTED NOT DETECTED Final   Candida glabrata NOT DETECTED NOT DETECTED Final   Candida krusei NOT DETECTED NOT DETECTED Final   Candida parapsilosis NOT DETECTED NOT DETECTED Final   Candida tropicalis NOT DETECTED NOT DETECTED Final  Culture, blood (routine x 2)     Status: None (Preliminary result)   Collection Time: 10/13/17  3:43 PM  Result Value Ref Range Status   Specimen Description   Final    BLOOD LEFT HAND Performed at [facility], [address], [place], [place] [address]    Special Requests   Final    BOTTLES DRAWN AEROBIC AND ANAEROBIC Blood Culture adequate volume Performed at [place] [address] [place], [place] [address]    Culture   Final    NO GROWTH 4 DAYS Performed at [facility], [place] [address], [place], [place] [address]    Report Status PENDING  Incomplete  Culture, sputum-assessment     Status: None   Collection Time: 10/13/17 10:12 PM  Result Value Ref Range Status   Specimen Description EXPECTORATED SPUTUM  Final   Special Requests Immunocompromised  Final   Sputum evaluation   Final    THIS SPECIMEN IS ACCEPTABLE FOR SPUTUM CULTURE Performed at [facility], [place] [address] [place],  [address]  Report Status 10/13/2017 FINAL  Final  Culture, respiratory (NON-Expectorated)     Status: None   Collection Time: 10/13/17 10:12 PM  Result Value Ref Range Status   Specimen Description   Final    EXPECTORATED SPUTUM Performed at [facility], [place] [address], [place], [place] [address]    Special Requests   Final    Immunocompromised Reflexed from [id number] Performed at [facility], [place] [address] [place], [place] [address]    Gram Stain   Final    ABUNDANT WBC PRESENT, PREDOMINANTLY PMN FEW GRAM POSITIVE COCCI FEW GRAM POSITIVE RODS FEW GRAM NEGATIVE COCCOBACILLI RARE YEAST    Culture   Final    MODERATE Consistent with normal respiratory flora. Performed at [facility], [place] [address], [place], [place] [address]    Report Status 10/16/2017 FINAL  Final  Body fluid culture     Status: None   Collection Time: 10/14/17  1:51 PM  Result Value Ref Range Status   Specimen Description   Final    PLEURAL RIGHT Performed at [place] [address], [place], [place] [address]    Special Requests   Final    NONE Performed at [facility], [place] [address]  [place], [place] [address]    Gram Stain   Final    ABUNDANT WBC PRESENT,BOTH PMN AND MONONUCLEAR NO ORGANISMS SEEN    Culture   Final    NO GROWTH 3 DAYS Performed at [facility], [place] [address] [place], [place] [address]    Report Status 10/17/2017 FINAL  Final         Radiology Studies: Ct Chest W Contrast  Result Date: 10/17/2017 CLINICAL DATA:  Follow-up pleural effusion. EXAM: CT CHEST WITH CONTRAST TECHNIQUE: Multidetector CT imaging of the chest was performed during intravenous contrast administration. CONTRAST:  60mL OMNIPAQUE IOHEXOL 300 MG/ML  SOLN COMPARISON:  Chest CT 09/21/2017 FINDINGS: Cardiovascular: Coronary artery calcification and aortic atherosclerotic calcification. Mediastinum/Nodes: No axillary supraclavicular adenopathy. No mediastinal or hilar adenopathy. No pericardial effusion. There is a large hiatal hernia. There is fluid-filled esophagus. Esophagus GE L air and fluid extends to above the carinal level. (Image 57/2 for example). Lungs/Pleura: Large bilateral pleural effusions with passive atelectasis the LEFT or RIGHT lower lobe is similar to comparison exam. There is patchy ground-glass opacities the upper lobes with bronchiectasis. There is nodular thickening along the superior aspect of the LEFT oblique fissure similar to comparison exam. Increased branching nodular airspace disease in the LEFT upper lobe (image 39/7). Upper Abdomen: Limited view of the liver, kidneys, pancreas are unremarkable. Normal adrenal glands. Large bilateral renal cysts Musculoskeletal: No aggressive osseous lesion. Significant degenerate change at the cervical spine. Inflammation surrounding the no chronic RIGHT shoulder dislocation. IMPRESSION: 1. No change in large bilateral pleural effusions with bibasilar passive atelectasis. 2. Increase in nodular airspace disease in the LEFT upper lobe consistent with pulmonary infection or aspiration. 3. Persistent nodular thickening along the  LEFT oblique fissure and some consolidation superior segment LEFT lower lobe similar to comparison exams. 4. Patchy ground-glass opacities in the RIGHT upper lobe are also increased consistent with infection or inflammation. 5. Esophagus is fluid-filled to above the level the carina. Findings suggest severe gastroesophageal reflux and could contribute to aspiration pneumonitis. Hiatal hernia noted. Aortic Atherosclerosis (ICD10-I70.0). Electronically Signed   By: Suzy Bouchard M.D.   On: 10/17/2017 16:09        Scheduled Meds: .  budesonide (PULMICORT) nebulizer solution  0.5 mg Nebulization BID  . feeding supplement (ENSURE ENLIVE)  237 mL Oral BID BM  . ferrous sulfate  325 mg Oral Daily  . fluticasone  2 spray Each Nare Daily  . furosemide  20 mg Oral BID  . gabapentin  300 mg Oral BID  . guaiFENesin  1,200 mg Oral BID  . ipratropium-albuterol  3 mL Nebulization Once  . ipratropium-albuterol  3 mL Nebulization TID  . latanoprost  1 drop Both Eyes QHS  . loratadine  10 mg Oral Daily  . multivitamin with minerals  1 tablet Oral Daily  . nystatin   Topical TID  . pantoprazole  40 mg Oral Daily  . pramipexole  1 mg Oral TID  . senna-docusate  2 tablet Oral QHS  . silver sulfADIAZINE   Topical Daily  . sodium chloride      . sodium chloride flush  3 mL Intravenous Q12H   Continuous Infusions: . sodium chloride 250 mL (10/13/17 1954)  . ceFEPime (MAXIPIME) IV Stopped (10/17/17 0786)  . heparin 1,450 Units/hr (10/17/17 0009)  . vancomycin Stopped (10/16/17 2316)     LOS: 4 days    Time spent: 35 minutes    Irine Seal, MD [facility] Pager [phone]  If 7PM-7AM, please contact night-coverage [email or web address] Password [facility] 10/17/2017, 4:23 PM

## 2017-10-18 LAB — CBC
HEMATOCRIT: 28 % — AB (ref 39.0–52.0)
HEMOGLOBIN: 8.6 g/dL — AB (ref 13.0–17.0)
MCH: 27.3 pg (ref 26.0–34.0)
MCHC: 30.7 g/dL (ref 30.0–36.0)
MCV: 88.9 fL (ref 78.0–100.0)
Platelets: 92 10*3/uL — ABNORMAL LOW (ref 150–400)
RBC: 3.15 MIL/uL — AB (ref 4.22–5.81)
RDW: 18.9 % — ABNORMAL HIGH (ref 11.5–15.5)
WBC: 4.2 10*3/uL (ref 4.0–10.5)

## 2017-10-18 LAB — CULTURE, BLOOD (ROUTINE X 2)
Culture: NO GROWTH
Special Requests: ADEQUATE

## 2017-10-18 LAB — BASIC METABOLIC PANEL
ANION GAP: 8 (ref 5–15)
BUN: 17 mg/dL (ref 6–20)
CHLORIDE: 103 mmol/L (ref 101–111)
CO2: 24 mmol/L (ref 22–32)
Calcium: 7.3 mg/dL — ABNORMAL LOW (ref 8.9–10.3)
Creatinine, Ser: 0.69 mg/dL (ref 0.61–1.24)
GFR calc Af Amer: >60 mL/min (ref >60)
GFR calc non Af Amer: >60 mL/min (ref >60)
Glucose, Bld: 78 mg/dL (ref 65–99)
POTASSIUM: 3.5 mmol/L (ref 3.5–5.1)
SODIUM: 135 mmol/L (ref 135–145)

## 2017-10-18 LAB — HEPARIN LEVEL (UNFRACTIONATED): Heparin Unfractionated: 0.88 IU/mL — ABNORMAL HIGH (ref 0.30–0.70)

## 2017-10-18 MED ORDER — POTASSIUM CHLORIDE CRYS ER 20 MEQ PO TBCR
40.0000 meq | EXTENDED_RELEASE_TABLET | Freq: Once | ORAL | Status: AC
  Administered 2017-10-18: 40 meq via ORAL
  Filled 2017-10-18: qty 2

## 2017-10-18 MED ORDER — FUROSEMIDE 10 MG/ML IJ SOLN
20.0000 mg | Freq: Two times a day (BID) | INTRAMUSCULAR | Status: DC
  Administered 2017-10-18 – 2017-10-19 (×3): 20 mg via INTRAVENOUS
  Filled 2017-10-18 (×3): qty 2

## 2017-10-18 MED ORDER — GLYCOPYRROLATE 0.2 MG/ML IJ SOLN
0.2000 mg | Freq: Four times a day (QID) | INTRAMUSCULAR | Status: DC | PRN
  Administered 2017-10-18: 0.2 mg via INTRAVENOUS
  Filled 2017-10-18: qty 1

## 2017-10-18 MED ORDER — RIVAROXABAN 20 MG PO TABS
20.0000 mg | ORAL_TABLET | Freq: Every day | ORAL | Status: DC
  Administered 2017-10-18: 20 mg via ORAL
  Filled 2017-10-18: qty 1

## 2017-10-18 MED ORDER — HEPARIN (PORCINE) IN NACL 100-0.45 UNIT/ML-% IJ SOLN
1100.0000 [IU]/h | INTRAMUSCULAR | Status: AC
  Administered 2017-10-18: 1100 [IU]/h via INTRAVENOUS
  Filled 2017-10-18 (×2): qty 250

## 2017-10-18 MED ORDER — ACETAMINOPHEN 325 MG PO TABS: 650.0000 mg | ORAL_TABLET | Freq: Four times a day (QID) | ORAL | Status: DC | PRN

## 2017-10-18 MED ORDER — HYDROCODONE-ACETAMINOPHEN 5-325 MG PO TABS
1.0000 | ORAL_TABLET | Freq: Four times a day (QID) | ORAL | Status: DC | PRN
  Administered 2017-10-18 – 2017-10-19 (×2): 2 via ORAL
  Filled 2017-10-18: qty 2
  Filled 2017-10-18 (×2): qty 1

## 2017-10-18 NOTE — Progress Notes (Signed)
[facility] P[facility]  Chart reviewed, CT chest images reviewed, body fluid analysis reviewed from left thoracentesis on 5/1. As this is a relatively small, uncomplicated parapneumonic effusion there is no indication for chest tube.    Roselie Awkward, MD [facility] Pager: [phone] Cell: [phone] After 3pm or if no response, call [phone]

## 2017-10-18 NOTE — Progress Notes (Signed)
ANTICOAGULATION CONSULT NOTE - f/u Consult  Pharmacy Consult for Heparin Indication: DVT  No Known Allergies  Patient Measurements: Height: 6\' 2"  (188 cm) Weight: 151 lb 7.3 oz (68.7 kg) IBW/kg (Calculated) : 82.2 Heparin Dosing Weight: actual weight  Vital Signs: Temp: 97.8 F (36.6 C) (05/05 0440) Temp Source: Oral (05/05 0440) BP: 104/61 (05/05 0440) Pulse Rate: 62 (05/05 0440)  Labs: Recent Labs    10/16/17 0407  10/17/17 0357 10/17/17 0750 10/17/17 1801 10/18/17 0405  HGB 8.7*  --  9.2*  --   --  8.6*  HCT 27.9*  --  28.8*  --   --  28.0*  PLT 108*  --  103*  --   --  92*  HEPARINUNFRC 0.20*   < >  --  0.55 0.57 0.88*  CREATININE 0.73  --  0.70  --   --  0.69   < > = values in this interval not displayed.   Estimated Creatinine Clearance: 65.6 mL/min (by C-G formula based on SCr of 0.69 mg/dL).   Medications:  PTA:  Xarelto 20mg  daily - Last dose noted as 10/12/17 @ 08:43  Assessment:  84 yr male admitted with pleural effusion and pneumonia  PMH significant for LLE DVT and patient on Xarelto.  Upon admission, Xarelto held for possible thoracentesis and pharmacy consulted to dose IV heparin  Baseline INR = 1.3 (Xarelto falsely elevates INR)                      APTT = 39 sec                      Heparin level = 1.34 (falsely elevated due to effects of Xarelto on heparin level)  Today, 10/18/2017  Heparin level supra-therapeutic at 0.88 on infusion at 1450 units/hr  CBC: Hgb decreased/stable, pltc decreased/stable 92K this am  No known bleeding issues  Goal of Therapy:  Heparin level 0.3-0.7 units/ml aPTT 66-102 seconds Monitor platelets by anticoagulation protocol: Yes   Plan:   Reduce Heparin infusion to 1100 units/hr  Daily HL and CBC  Minda Ditto PharmD Pager [phone] 10/18/2017, 11:20 AM

## 2017-10-18 NOTE — Progress Notes (Signed)
PROGRESS NOTE    Derrick Sanchez  HWE:[id number] DOB: [birth year] DOA: 10/13/2017 PCP: [name], MD    Brief Narrative:  Derrick Sanchez is a 84 y.o. male with medical history significant of forspinal epidural abscess, lumbar radiculopathy, paraparesis of lower limbs, chronic osteomyelitis of lumbar spine, frequent falls, malnutrition, failure to thrive who presented to ED [facility] from SNF with complaints of dyspnea and persistent productive cough.  Patient had recent admission and was treated for pneumonia as well as pleural effusion.  Presents with similar complaints  ED Course: Patient was found to have recurring left pleural effusion and possible pneumonia we are consulted for further evaluation and recommendations     Assessment & Plan:   Principal Problem:   Acute on chronic respiratory failure with hypoxia ([place]) Active Problems:   Empyema lung (HCC)   Pleural effusion   HCAP (healthcare-associated pneumonia)   Bacteremia due to Gram-positive bacteria   GASTROESOPHAGEAL REFLUX DISEASE   Protein-calorie malnutrition, severe ([place])   Essential hypertension   Pressure injury of skin   At high risk for aspiration   Pleural effusion on left   Deep vein thrombosis (DVT) of lower extremity ([place])  #1 acute on chronic respiratory failure with hypoxia secondary to healthcare associated pneumonia and recurrent left sided pleural effusion/empyema Patient had presented acute on chronic respiratory failure chest x-ray consistent with healthcare associated pneumonia recurrent left-sided pleural effusion.  Patient recently hospitalized with similar symptoms.  Patient with increased O2 requirements the night of admission patient was requiring nonrebreather.  Hypoxia improving and rhonchorous breath sounds slowly improved with couple doses of IV diuretics.  Patient now on home dose oral Lasix.  Patient now with rhonchorous gurgly breath sounds.  Patient given fluid bolus and some IV fluids early  on in the hospitalization, due to borderline blood pressure and patient's diuretics discontinued.  Saline lock IV fluids. Patient status post left-sided thoracentesis with 200 cc fluid removed.  Body fluids consistent with an exudate and probable empyema.  Patient has had recurrent left-sided pleural effusions times approximately 3 over the past month requiring hospitalizations.  Blood cultures pending with 2/4 with gram-positive bacteremia preliminary results.  Continue empiric IV cefepime.  Continue Mucinex, scheduled nebs, PPI, Flonase, Claritin.  Discontinue IV vancomycin.  Patient received a dose of IV Lasix with some clinical improvement.  Due to concern for empyema/recurrent left-sided pleural effusions over the past month cardiothoracic surgery was consulted on 10/15/2017 however nobody assessed the patient.  Called and spoke with Dr. Roxan Hockey of cardiothoracic surgery on 10/16/2017 who stated that patient could likely not undergo any surgical procedures at this time, recommended we get a repeat CT chest and recommended that we have pulmonary assess the patient to deal with patient's empyema as they had a presence here at [facility] and possible recommendation of pigtail catheter placement and probable thrombolytics if loculated effusion noted on CT chest.  Patient seen in consultation by pulmonary on 10/16/2017 who is in agreement with CT chest and further recommendations pending CT chest results.  Per pulmonary CT images reviewed body fluid analysis reviewed from left thoracentesis on 10/14/2017 and few patient has a relatively small uncomplicated parapneumonic effusion with no current indication for chest tube at this time.  Due to patient's rhonchorous gurgly breath sounds we will discontinue oral Lasix and placed on IV Lasix 20 mg every 12 hours.  We will also placed on [name] 2 mg IV every 6 hours as needed secretions.  Pulmonary following and appreciate  their input and recommendations.   2.   Bacteremia due to gram-positive bacteria Preliminary blood cultures 2/4 with gram-positive bacteremia/coagulase-negative Staphylococcus.. Discontinue IV vancomycin.   3.  Gastroesophageal reflux disease PPI increased to twice daily.   4.  Hypertension Blood pressure was borderline and as such diuretics were initially discontinued.  Blood pressure has improved.  IV fluids saline lock.  We will discontinue home dose Lasix and placed on Lasix 20 mg IV every 12 hours.  Continue to hold other antihypertensive medications.  Follow.   5.  Stage II sacral pressure injury/unstageable pressure injury of left hip and left ischium/multiple abrasions to lower back and bilateral buttocks/left posterior leg with healing full-thickness wound Patient has been seen by wound care nurse.  Continue current recommended dressing changes.  #6 severe protein calorie malnutrition Continue nutritional supplementation.   7.  Groin candidiasis Nystatin powder.  8.  Diastolic heart failure 2D echo done 09/22/2017 with a EF of 60 to 16%, grade 1 diastolic dysfunction.  Diuretics were initially discontinued secondary to borderline/low blood pressure.  Diuretics have been resumed.  Patient with rhonchorous gurgly breath sounds noted on examination with likely increased secretions.  IV fluids have been saline locked.  Patient noted to have some bouts of hypoxia during the hospitalization that improved with diuretics.  Patient back on home dose oral Lasix.  Will place on Lasix 20 mg IV every 12 hours for now and hold home dose oral Lasix for now.  Strict I's and O's.  Daily weights.    9.  Chronic low back pain/history of chronic vertebral osteomyelitis Patient on chronic suppressive therapy with Levaquin.  Levaquin not resumed during this hospitalization as patient on broad-spectrum IV antibiotics. Resume Levaquin once acute antibiotics have been discontinued.  Follow.  10.  History of left lower extremity DVT Was on Xarelto  at home however due to thoracentesis patient placed on heparin.  Will place patient back on Xarelto as patient unlikely to have any further procedures.  #11 anemia/iron deficiency anemia Patient with no overt bleeding.  Anemia panel consistent with iron deficiency anemia with iron level of 8 and a ferritin of 107.  B12 level of 783.  Hemoglobin stable at 8.6.  Transfusion threshold hemoglobin less than 7.  Follow H&H for now.  12.  Severe protein calorie malnutrition Nutritional supplementation.  13 prognosis Likely with a poor prognosis multiple admissions over the past month x3 with recurrent pleural effusions and pneumonia, history of spinal epidural abscess on chronic suppressive antibiotics, chronic osteomyelitis of the lumbar spine, failure to thrive, frequent falls, severe protein calorie malnutrition now with an empyema.  Patient on empiric IV antibiotics.  Palliative care consultation has been done and palliative care following.  Patient is also being followed by hospice liason.    DVT prophylaxis: Heparin Code Status: Partial code Family Communication: Updated patient.  No family at bedside.  Disposition Plan: To be determined pending hospitalization.  Likely home with home health versus residential hospice once clinically stable and cleared by pulmonary.   Consultants:   Interventional radiology  Pulmonary: Dr.Wert 10/16/2017  Procedures:   Chest x-ray 10/13/2017  Ultrasound-guided diagnostic and therapeutic left thoracentesis with 200 cc removed 10/14/2017 per Rowe Robert, PA/Dr. Hoss  CT chest 10/17/2017  Antimicrobials:   IV cefepime 10/13/2017  Vancomycin 10/13/2017>>> 10/18/2017   Subjective: Patient feels his shortness of breath is slowly improving.  Tolerating current diet.  Denies chest pain or shortness of breath.    Objective: Vitals:   10/17/17  2142 10/18/17 0440 10/18/17 0500 10/18/17 0823  BP:  104/61    Pulse:  62    Resp:  15    Temp:  97.8 F (36.6  C)    TempSrc:  Oral    SpO2: 95% 94%  97%  Weight:   68.7 kg (151 lb 7.3 oz)   Height:        Intake/Output Summary (Last 24 hours) at 10/18/2017 1312 Last data filed at 10/17/2017 2128 Gross per 24 hour  Intake -  Output 500 ml  Net -500 ml   Filed Weights   10/17/17 0653 10/17/17 2046 10/18/17 0500  Weight: 69.4 kg (153 lb) 69.4 kg (153 lb) 68.7 kg (151 lb 7.3 oz)    Examination:  General exam: Frail.  Chronically ill-appearing.  Cachectic. Respiratory system: Diffuse rhonchorous/gurgly breath sounds.  No wheezing.  Respiratory effort normal. Cardiovascular system: RRR no murmurs rubs or gallops.  No JVD.  No lower extremity edema. Gastrointestinal system: Abdomen is nontender, nondistended, soft, positive bowel sounds.  No rebound.  No guarding.  Abdomen is scaphoid.   Central nervous system: Alert and oriented. No focal neurological deficits. Extremities: Symmetric 5 x 5 power. Skin: Stage II pressure injury sacrum, previous pressure injury stage III left outer ankle/unstageable pressure injury left hip/left ischium with unstageable pressure injury/left posterior leg with posterior full-thickness wound. Psychiatry: Judgement and insight appear fair. Mood & affect appropriate.     Data Reviewed: I have personally reviewed following labs and imaging studies  CBC: Recent Labs  Lab 10/13/17 1433 10/14/17 0406 10/15/17 0605 10/16/17 0407 10/17/17 0357 10/18/17 0405  WBC 9.3 10.5 8.7 7.0 5.5 4.2  NEUTROABS 8.0*  --  7.3  --   --   --   HGB 9.8* 8.6* 8.7* 8.7* 9.2* 8.6*  HCT 32.0* 26.7* 27.9* 27.9* 28.8* 28.0*  MCV 90.4 88.7 90.0 89.4 89.4 88.9  PLT 138* 123* 114* 108* 103* 92*   Basic Metabolic Panel: Recent Labs  Lab 10/14/17 0938 10/15/17 0605 10/16/17 0407 10/17/17 0357 10/18/17 0405  NA 139 137 138 135 135  K 3.0* 3.8 3.6 3.6 3.5  CL 106 107 107 103 103  CO2 24 24 23  21* 24  GLUCOSE 124* 94 81 73 78  BUN 26* 24* 21* 18 17  CREATININE 1.00 0.83 0.73 0.70  0.69  CALCIUM 7.2* 7.2* 7.4* 7.5* 7.3*  MG 1.7 2.4 2.1  --   --    GFR: Estimated Creatinine Clearance: 65.6 mL/min (by C-G formula based on SCr of 0.69 mg/dL). Liver Function Tests: Recent Labs  Lab 10/14/17 0938  AST 25  ALT 17  ALKPHOS 68  BILITOT 0.5  PROT 5.1*  ALBUMIN 1.6*   No results for input(s): LIPASE, AMYLASE in the last 168 hours. No results for input(s): AMMONIA in the last 168 hours. Coagulation Profile: Recent Labs  Lab 10/13/17 1433  INR 1.30   Cardiac Enzymes: No results for input(s): CKTOTAL, CKMB, CKMBINDEX, TROPONINI in the last 168 hours. BNP (last 3 results) No results for input(s): PROBNP in the last 8760 hours. HbA1C: No results for input(s): HGBA1C in the last 72 hours. CBG: Recent Labs  Lab 10/14/17 0726  GLUCAP 81   Lipid Profile: No results for input(s): CHOL, HDL, LDLCALC, TRIG, CHOLHDL, LDLDIRECT in the last 72 hours. Thyroid Function Tests: No results for input(s): TSH, T4TOTAL, FREET4, T3FREE, THYROIDAB in the last 72 hours. Anemia Panel: No results for input(s): VITAMINB12, FOLATE, FERRITIN, TIBC, IRON, RETICCTPCT in the last  72 hours. Sepsis Labs: Recent Labs  Lab 10/13/17 1448  LATICACIDVEN 1.46    Recent Results (from the past 240 hour(s))  Culture, blood (routine x 2)     Status: Abnormal   Collection Time: 10/13/17  2:33 PM  Result Value Ref Range Status   Specimen Description   Final    BLOOD LEFT ANTECUBITAL Performed at [facility] [address], [place], [place] [address]    Special Requests   Final    BOTTLES DRAWN AEROBIC AND ANAEROBIC Blood Culture adequate volume Performed at [facility] [address], [place], [place] [address]    Culture  Setup Time   Final    GRAM POSITIVE COCCI IN CLUSTERS IN BOTH AEROBIC AND ANAEROBIC BOTTLES CRITICAL RESULT CALLED TO, READ BACK BY AND VERIFIED WITH: Melodye Ped PharmD 13:35 10/14/17 ([email or web address])    Culture (A)  Final     STAPHYLOCOCCUS SPECIES (COAGULASE NEGATIVE) THE SIGNIFICANCE OF ISOLATING THIS ORGANISM FROM A SINGLE SET OF BLOOD CULTURES WHEN MULTIPLE SETS ARE DRAWN IS UNCERTAIN. PLEASE NOTIFY THE MICROBIOLOGY DEPARTMENT WITHIN ONE WEEK IF SPECIATION AND SENSITIVITIES ARE REQUIRED. Performed at [facility], [place] [address] [place], [place] [address]    Report Status 10/16/2017 FINAL  Final  Blood Culture ID Panel (Reflexed)     Status: Abnormal   Collection Time: 10/13/17  2:33 PM  Result Value Ref Range Status   Enterococcus species NOT DETECTED NOT DETECTED Final   Listeria monocytogenes NOT DETECTED NOT DETECTED Final   Staphylococcus species DETECTED (A) NOT DETECTED Final    Comment: Methicillin (oxacillin) resistant coagulase negative staphylococcus. Possible blood culture contaminant (unless isolated from more than one blood culture draw or clinical case suggests pathogenicity). No antibiotic treatment is indicated for blood  culture contaminants. CRITICAL RESULT CALLED TO, READ BACK BY AND VERIFIED WITH: Melodye Ped PharmD 13:35 10/14/17 ([email or web address])    Staphylococcus aureus NOT DETECTED NOT DETECTED Final   Methicillin resistance DETECTED (A) NOT DETECTED Final    Comment: CRITICAL RESULT CALLED TO, READ BACK BY AND VERIFIED WITH: Melodye Ped PharmD 13:35 10/14/17 ([email or web address])    Streptococcus species NOT DETECTED NOT DETECTED Final   Streptococcus agalactiae NOT DETECTED NOT DETECTED Final   Streptococcus pneumoniae NOT DETECTED NOT DETECTED Final   Streptococcus pyogenes NOT DETECTED NOT DETECTED Final   Acinetobacter baumannii NOT DETECTED NOT DETECTED Final   Enterobacteriaceae species NOT DETECTED NOT DETECTED Final   Enterobacter cloacae complex NOT DETECTED NOT DETECTED Final   Escherichia coli NOT DETECTED NOT DETECTED Final   Klebsiella oxytoca NOT DETECTED NOT DETECTED Final   Klebsiella pneumoniae NOT DETECTED NOT DETECTED Final   Proteus species NOT DETECTED NOT DETECTED Final    Serratia marcescens NOT DETECTED NOT DETECTED Final   Haemophilus influenzae NOT DETECTED NOT DETECTED Final   Neisseria meningitidis NOT DETECTED NOT DETECTED Final   Pseudomonas aeruginosa NOT DETECTED NOT DETECTED Final   Candida albicans NOT DETECTED NOT DETECTED Final   Candida glabrata NOT DETECTED NOT DETECTED Final   Candida krusei NOT DETECTED NOT DETECTED Final   Candida parapsilosis NOT DETECTED NOT DETECTED Final   Candida tropicalis NOT DETECTED NOT DETECTED Final  Culture, blood (routine x 2)     Status: None   Collection Time: 10/13/17  3:43 PM  Result Value Ref Range Status   Specimen Description   Final    BLOOD LEFT HAND Performed at [facility], [address], [place], [place] [address]  Special Requests   Final    BOTTLES DRAWN AEROBIC AND ANAEROBIC Blood Culture adequate volume Performed at [facility] [address], [place], [place] [address]    Culture   Final    NO GROWTH 5 DAYS Performed at [facility], [place] [address], [place], [place] [address]    Report Status 10/18/2017 FINAL  Final  Culture, sputum-assessment     Status: None   Collection Time: 10/13/17 10:12 PM  Result Value Ref Range Status   Specimen Description EXPECTORATED SPUTUM  Final   Special Requests Immunocompromised  Final   Sputum evaluation   Final    THIS SPECIMEN IS ACCEPTABLE FOR SPUTUM CULTURE Performed at [facility], [place] [address], [place], [place] [address]    Report Status 10/13/2017 FINAL  Final  Culture, respiratory (NON-Expectorated)     Status: None   Collection Time: 10/13/17 10:12 PM  Result Value Ref Range Status   Specimen Description   Final    EXPECTORATED SPUTUM Performed at [facility], [place] [address], [place], [place] [address]    Special Requests   Final    Immunocompromised Reflexed from [phone] Performed at [facility], [place] [address], [place], [place]  [address]    Gram Stain   Final    ABUNDANT WBC PRESENT, PREDOMINANTLY PMN FEW GRAM POSITIVE COCCI FEW GRAM POSITIVE RODS FEW GRAM NEGATIVE COCCOBACILLI RARE YEAST    Culture   Final    MODERATE Consistent with normal respiratory flora. Performed at [facility], [place] [address] [place], [place] [address]    Report Status 10/16/2017 FINAL  Final  Body fluid culture     Status: None   Collection Time: 10/14/17  1:51 PM  Result Value Ref Range Status   Specimen Description   Final    PLEURAL RIGHT Performed at [facility] [address], [place], [place] [address]    Special Requests   Final    NONE Performed at [facility], [place] [address] [place], [place] [address]    Gram Stain   Final    ABUNDANT WBC PRESENT,BOTH PMN AND MONONUCLEAR NO ORGANISMS SEEN    Culture   Final    NO GROWTH 3 DAYS Performed at [facility], [place] [address], [place], [place] [address]    Report Status 10/17/2017 FINAL  Final         Radiology Studies: Ct Chest W Contrast  Result Date: 10/17/2017 CLINICAL DATA:  Follow-up pleural effusion. EXAM: CT CHEST WITH CONTRAST TECHNIQUE: Multidetector CT imaging of the chest was performed during intravenous contrast administration. CONTRAST:  40mL OMNIPAQUE IOHEXOL 300 MG/ML  SOLN COMPARISON:  Chest CT 09/21/2017 FINDINGS: Cardiovascular: Coronary artery calcification and aortic atherosclerotic calcification. Mediastinum/Nodes: No axillary supraclavicular adenopathy. No mediastinal or hilar adenopathy. No pericardial effusion. There is a large hiatal hernia. There is fluid-filled esophagus. Esophagus GE L air and fluid extends to above the carinal level. (Image 57/2 for example). Lungs/Pleura: Large bilateral pleural effusions with passive atelectasis the LEFT or RIGHT lower lobe is similar to comparison exam. There is patchy ground-glass opacities the upper lobes with bronchiectasis. There is nodular thickening  along the superior aspect of the LEFT oblique fissure similar to comparison exam. Increased branching nodular airspace disease in the LEFT upper lobe (image 39/7). Upper Abdomen: Limited view of the liver, kidneys, pancreas are unremarkable. Normal adrenal glands. Large bilateral renal cysts Musculoskeletal: No  aggressive osseous lesion. Significant degenerate change at the cervical spine. Inflammation surrounding the no chronic RIGHT shoulder dislocation. IMPRESSION: 1. No change in large bilateral pleural effusions with bibasilar passive atelectasis. 2. Increase in nodular airspace disease in the LEFT upper lobe consistent with pulmonary infection or aspiration. 3. Persistent nodular thickening along the LEFT oblique fissure and some consolidation superior segment LEFT lower lobe similar to comparison exams. 4. Patchy ground-glass opacities in the RIGHT upper lobe are also increased consistent with infection or inflammation. 5. Esophagus is fluid-filled to above the level the carina. Findings suggest severe gastroesophageal reflux and could contribute to aspiration pneumonitis. Hiatal hernia noted. Aortic Atherosclerosis (ICD10-I70.0). Electronically Signed   By: Suzy Bouchard M.D.   On: 10/17/2017 16:09        Scheduled Meds: . budesonide (PULMICORT) nebulizer solution  0.5 mg Nebulization BID  . feeding supplement (ENSURE ENLIVE)  237 mL Oral BID BM  . ferrous sulfate  325 mg Oral Daily  . fluticasone  2 spray Each Nare Daily  . furosemide  20 mg Intravenous BID  . gabapentin  300 mg Oral BID  . guaiFENesin  1,200 mg Oral BID  . ipratropium-albuterol  3 mL Nebulization Once  . ipratropium-albuterol  3 mL Nebulization TID  . latanoprost  1 drop Both Eyes QHS  . loratadine  10 mg Oral Daily  . multivitamin with minerals  1 tablet Oral Daily  . nystatin   Topical TID  . pantoprazole  40 mg Oral BID AC  . pramipexole  1 mg Oral TID  . senna-docusate  2 tablet Oral QHS  . silver  sulfADIAZINE   Topical Daily  . sodium chloride flush  3 mL Intravenous Q12H   Continuous Infusions: . sodium chloride 250 mL (10/13/17 1954)  . ceFEPime (MAXIPIME) IV Stopped (10/18/17 6599)  . heparin 1,100 Units/hr (10/18/17 1006)     LOS: 5 days    Time spent: 35 minutes    Irine Seal, MD [facility] Pager [phone]  If 7PM-7AM, please contact night-coverage [email or web address] Password [email or web address] 10/18/2017, 1:12 PM

## 2017-10-18 NOTE — Progress Notes (Signed)
ANTICOAGULATION CONSULT NOTE - f/u Consult  Pharmacy Consult for Heparin ===> Xarelto Indication: DVT  No Known Allergies  Patient Measurements: Height: 6\' 2"  (188 cm) Weight: 151 lb 7.3 oz (68.7 kg) IBW/kg (Calculated) : 82.2 Heparin Dosing Weight: actual weight  Vital Signs: Temp: 98.5 F (36.9 C) (05/05 1333) Temp Source: Oral (05/05 1333) BP: 128/67 (05/05 1333) Pulse Rate: 91 (05/05 1333)  Labs: Recent Labs    10/16/17 0407  10/17/17 0357 10/17/17 0750 10/17/17 1801 10/18/17 0405  HGB 8.7*  --  9.2*  --   --  8.6*  HCT 27.9*  --  28.8*  --   --  28.0*  PLT 108*  --  103*  --   --  92*  HEPARINUNFRC 0.20*   < >  --  0.55 0.57 0.88*  CREATININE 0.73  --  0.70  --   --  0.69   < > = values in this interval not displayed.   Estimated Creatinine Clearance: 65.6 mL/min (by C-G formula based on SCr of 0.69 mg/dL).   Medications:  PTA:  Xarelto 20mg  daily - Last dose noted as 10/12/17 @ 08:43  Assessment:  84 yr male admitted with pleural effusion and pneumonia  PMH significant for LLE DVT and patient on Xarelto.  Upon admission, Xarelto held for possible thoracentesis and pharmacy consulted to dose IV heparin  Baseline INR = 1.3 (Xarelto falsely elevates INR)                      APTT = 39 sec                      Heparin level = 1.34 (falsely elevated due to effects of Xarelto on heparin level)  Today, 10/18/2017  Heparin level supra-therapeutic at 0.88 on infusion at 1450 units/hr  CBC: Hgb decreased/stable, pltc decreased/stable 92K this am  No known bleeding issues  2nd shift f/u:  Plans to switch IV heparin to xarelto as no further procedures anticipated.  Pharmacy consulted to dose Xarelto  Goal of Therapy:  Monitor platelets by anticoagulation protocol: Yes   Plan:   At 20:00 d/c IV heparin infusion  Xarelto 20mg  po daily - begin @ 20:00 (spoke with RN to discuss plan in timing of stopping and starting medications)  Leone Haven,  PharmD 10/18/2017, 7:37 PM

## 2017-10-18 NOTE — Progress Notes (Signed)
[facility] Liaison: RN visit  Follow up with son, [name] regarding home hospice services. Son and family are in agreement to go home with hospice support. Chart and patient information reviewed [place] physician. Hospice eligibility approved by Dr Tomasa Hosteller.  Writer spoke with son to initiate education related to hospice philosophy, services and team approach to care. verbalized understanding of information given. Per discussion, plan is for discharge to home by [facility]. Discharge date undetermined at this time.  Please send signed and completed DNR form home with patient/family. Patient will need prescriptions for discharge comfort medications.  DME needs have been discussed, patient currently has the following equipment in the home:walker, motorized W/C . Patient/family requests the following DME for delivery to the home: hospital bed, OBT, suction, oxygen. Hospital liaison contacted [facility] to arrange delivery to the home. Home address has been verified and is correct in the chart. [name] is the family member to contact to arrange time of delivery.  [facility] aware of the above. Please notify [facility] when patient is ready to leave the unit at discharge. (Call [phone] or [phone] after 5pm.) [facility] information and contact numbers given to at time of visit. Above information shared with CMRN.  Please call with any hospice related questions.  Thank you for this referral.  Farrel Gordon, RN, [facility] Liaison [phone] ? [facility] liaisons are now on [place].

## 2017-10-19 LAB — BASIC METABOLIC PANEL
ANION GAP: 9 (ref 5–15)
BUN: 21 mg/dL — AB (ref 6–20)
CO2: 23 mmol/L (ref 22–32)
Calcium: 7.5 mg/dL — ABNORMAL LOW (ref 8.9–10.3)
Chloride: 103 mmol/L (ref 101–111)
Creatinine, Ser: 0.8 mg/dL (ref 0.61–1.24)
GLUCOSE: 76 mg/dL (ref 65–99)
Potassium: 4 mmol/L (ref 3.5–5.1)
Sodium: 135 mmol/L (ref 135–145)

## 2017-10-19 MED ORDER — RIVAROXABAN 20 MG PO TABS
20.0000 mg | ORAL_TABLET | Freq: Every day | ORAL | Status: DC
  Administered 2017-10-19: 20 mg via ORAL
  Filled 2017-10-19: qty 1

## 2017-10-19 MED ORDER — FUROSEMIDE 20 MG PO TABS
20.0000 mg | ORAL_TABLET | Freq: Two times a day (BID) | ORAL | Status: DC
  Administered 2017-10-20: 20 mg via ORAL
  Filled 2017-10-19: qty 1

## 2017-10-19 NOTE — Progress Notes (Signed)
PROGRESS NOTE    Derrick Sanchez  CBJ:[id number] DOB: 1930-08-22 DOA: 10/13/2017 PCP: [name], MD    Brief Narrative:  Derrick Sanchez is a 84 y.o. male with medical history significant of forspinal epidural abscess, lumbar radiculopathy, paraparesis of lower limbs, chronic osteomyelitis of lumbar spine, frequent falls, malnutrition, failure to thrive who presented to ED [facility] from SNF with complaints of dyspnea and persistent productive cough.  Patient had recent admission and was treated for pneumonia as well as pleural effusion.  Presents with similar complaints  ED Course: Patient was found to have recurring left pleural effusion and possible pneumonia we are consulted for further evaluation and recommendations     Assessment & Plan:   Principal Problem:   Acute on chronic respiratory failure with hypoxia ([place]) Active Problems:   Empyema lung (HCC)   Pleural effusion   HCAP (healthcare-associated pneumonia)   Bacteremia due to Gram-positive bacteria   GASTROESOPHAGEAL REFLUX DISEASE   Protein-calorie malnutrition, severe ([place])   Essential hypertension   Pressure injury of skin   At high risk for aspiration   Pleural effusion on left   Deep vein thrombosis (DVT) of lower extremity ([place])  #1 acute on chronic respiratory failure with hypoxia secondary to healthcare associated pneumonia and recurrent left sided pleural effusion/empyema Patient had presented acute on chronic respiratory failure chest x-ray consistent with healthcare associated pneumonia recurrent left-sided pleural effusion.  Patient recently hospitalized with similar symptoms.  Patient with increased O2 requirements the night of admission patient was requiring nonrebreather.  Hypoxia improving and rhonchorous breath sounds slowly improved with couple doses of IV diuretics.  Patient now on home dose oral Lasix.  Patient now with rhonchorous gurgly breath sounds.  Patient given fluid bolus and some IV fluids early  on in the hospitalization, due to borderline blood pressure and patient's diuretics discontinued.  Saline lock IV fluids. Patient status post left-sided thoracentesis with 200 cc fluid removed.  Body fluids consistent with an exudate and probable empyema.  Patient has had recurrent left-sided pleural effusions times approximately 3 over the past month requiring hospitalizations.  Blood cultures pending with 2/4 with gram-positive bacteremia preliminary results.  Continue empiric IV cefepime.  Continue Mucinex, scheduled nebs, PPI, Flonase, Claritin.  Discontinue IV vancomycin.  Patient received a dose of IV Lasix with some clinical improvement.  Due to concern for empyema/recurrent left-sided pleural effusions over the past month cardiothoracic surgery was consulted on 10/15/2017 however nobody assessed the patient.  Called and spoke with Dr. Roxan Hockey of cardiothoracic surgery on 10/16/2017 who stated that patient could likely not undergo any surgical procedures at this time, recommended we get a repeat CT chest and recommended that we have pulmonary assess the patient to deal with patient's empyema as they had a presence here at [facility] and possible recommendation of pigtail catheter placement and probable thrombolytics if loculated effusion noted on CT chest.  Patient seen in consultation by pulmonary on 10/16/2017 who is in agreement with CT chest and further recommendations pending CT chest results.  Per pulmonary CT images reviewed body fluid analysis reviewed from left thoracentesis on 10/14/2017 and few patient has a relatively small uncomplicated parapneumonic effusion with no current indication for chest tube at this time.  Due to patient's rhonchorous gurgly breath sounds patient placed on IV Lasix every 12 hours.  Will continue through today and reduce to resume home dose oral Lasix tomorrow.  Continue [name] as needed for secretions.  2.  Bacteremia due to gram-positive  bacteria Preliminary  blood cultures 2/4 with gram-positive bacteremia/coagulase-negative Staphylococcus.  Likely contamination.  IV vancomycin has been discontinued.  3.  Gastroesophageal reflux disease PPI increased to twice daily.   4.  Hypertension Blood pressure was borderline and as such diuretics were initially discontinued.  Blood pressure has improved.  IV fluids saline lock.  Continue IV Lasix and resume home dose Lasix tomorrow. Continue to hold other antihypertensive medications.  Follow.   5.  Stage II sacral pressure injury/unstageable pressure injury of left hip and left ischium/multiple abrasions to lower back and bilateral buttocks/left posterior leg with healing full-thickness wound Patient has been seen by wound care nurse.  Continue current recommended dressing changes.  #6 severe protein calorie malnutrition Continue nutritional supplementation.   7.  Groin candidiasis Nystatin powder.  8.  Diastolic heart failure 2D echo done 09/22/2017 with a EF of 60 to 46%, grade 1 diastolic dysfunction.  Diuretics were initially discontinued secondary to borderline/low blood pressure.  Diuretics have been resumed.  Patient with rhonchorous gurgly breath sounds noted on examination with likely increased secretions.  IV fluids have been saline locked.  Patient noted to have some bouts of hypoxia during the hospitalization that improved with diuretics.  Patient back on home dose oral Lasix.  Continue Lasix 20 mg IV every 12 hours.   Strict I's and O's.  Daily weights.    9.  Chronic low back pain/history of chronic vertebral osteomyelitis Patient on chronic suppressive therapy with Levaquin.  Levaquin not resumed during this hospitalization as patient on broad-spectrum IV antibiotics. Resume Levaquin once acute antibiotics have been discontinued.  Follow.  10.  History of left lower extremity DVT Was on Xarelto at home however due to thoracentesis patient placed on heparin.  No further procedures planned  Xarelto was resumed last night.  #11 anemia/iron deficiency anemia Patient with no overt bleeding.  Anemia panel consistent with iron deficiency anemia with iron level of 8 and a ferritin of 107.  B12 level of 783.  Hemoglobin stable at 8.6.  Transfusion threshold hemoglobin less than 7.  Follow H&H for now.  12.  Severe protein calorie malnutrition Nutritional supplementation.  13 prognosis Likely with a poor prognosis multiple admissions over the past month x3 with recurrent pleural effusions and pneumonia, history of spinal epidural abscess on chronic suppressive antibiotics, chronic osteomyelitis of the lumbar spine, failure to thrive, frequent falls, severe protein calorie malnutrition now with an empyema.  Patient on empiric IV antibiotics.  Palliative care consultation has been done and palliative care following.  Patient is also being followed by hospice liason.  Patient likely home with hospice following.    DVT prophylaxis: Heparin Code Status: Partial code Family Communication: Updated patient.  No family at bedside.  Disposition Plan: Likely home with hospice likely in the next 24 to 48 hours.   Consultants:   Interventional radiology  Pulmonary: Dr.Wert 10/16/2017  Procedures:   Chest x-ray 10/13/2017  Ultrasound-guided diagnostic and therapeutic left thoracentesis with 200 cc removed 10/14/2017 per Rowe Robert, PA/Dr. Hoss  CT chest 10/17/2017  Antimicrobials:   IV cefepime 10/13/2017  Vancomycin 10/13/2017>>> 10/18/2017   Subjective: Patient in bed.  States breathing is improving.  Denies any chest pain.  Feeling better.  Tolerating current diet.  Hoping to be discharged home today.   Objective: Vitals:   10/18/17 2015 10/19/17 0502 10/19/17 0509 10/19/17 0901  BP: 122/68 (!) 104/57    Pulse: 74 66  (!) 59  Resp: 20 20  18  Temp: (!) 97.5 F (36.4 C) 97.8 F (36.6 C)    TempSrc: Oral Oral    SpO2: 97% 97%  96%  Weight:   67.9 kg (149 lb 11.1 oz)   Height:         Intake/Output Summary (Last 24 hours) at 10/19/2017 1249 Last data filed at 10/19/2017 1000 Gross per 24 hour  Intake 8096.17 ml  Output 900 ml  Net 7196.17 ml   Filed Weights   10/17/17 2046 10/18/17 0500 10/19/17 0509  Weight: 69.4 kg (153 lb) 68.7 kg (151 lb 7.3 oz) 67.9 kg (149 lb 11.1 oz)    Examination:  General exam: Frail.  Chronically ill-appearing.  Cachectic. Respiratory system: Less Diffuse rhonchorous/gurgly breath sounds.  No wheezing.  Respiratory effort normal. Cardiovascular system: Regular rate and rhythm no murmurs rubs or gallops.  No JVD.  No lower extremity edema.  In heel floaters. Gastrointestinal system: Abdomen is soft, nontender, nondistended, positive bowel sounds.  No rebound.  No guarding.     Central nervous system: Alert and oriented. No focal neurological deficits. Extremities: Symmetric 5 x 5 power. Skin: Stage II pressure injury sacrum, previous pressure injury stage III left outer ankle/unstageable pressure injury left hip/left ischium with unstageable pressure injury/left posterior leg with posterior full-thickness wound. Psychiatry: Judgement and insight appear fair. Mood & affect appropriate.     Data Reviewed: I have personally reviewed following labs and imaging studies  CBC: Recent Labs  Lab 10/13/17 1433 10/14/17 0406 10/15/17 0605 10/16/17 0407 10/17/17 0357 10/18/17 0405  WBC 9.3 10.5 8.7 7.0 5.5 4.2  NEUTROABS 8.0*  --  7.3  --   --   --   HGB 9.8* 8.6* 8.7* 8.7* 9.2* 8.6*  HCT 32.0* 26.7* 27.9* 27.9* 28.8* 28.0*  MCV 90.4 88.7 90.0 89.4 89.4 88.9  PLT 138* 123* 114* 108* 103* 92*   Basic Metabolic Panel: Recent Labs  Lab 10/14/17 0938 10/15/17 0605 10/16/17 0407 10/17/17 0357 10/18/17 0405 10/19/17 0410  NA 139 137 138 135 135 135  K 3.0* 3.8 3.6 3.6 3.5 4.0  CL 106 107 107 103 103 103  CO2 24 24 23  21* 24 23  GLUCOSE 124* 94 81 73 78 76  BUN 26* 24* 21* 18 17 21*  CREATININE 1.00 0.83 0.73 0.70 0.69 0.80    CALCIUM 7.2* 7.2* 7.4* 7.5* 7.3* 7.5*  MG 1.7 2.4 2.1  --   --   --    GFR: Estimated Creatinine Clearance: 64.8 mL/min (by C-G formula based on SCr of 0.8 mg/dL). Liver Function Tests: Recent Labs  Lab 10/14/17 0938  AST 25  ALT 17  ALKPHOS 68  BILITOT 0.5  PROT 5.1*  ALBUMIN 1.6*   No results for input(s): LIPASE, AMYLASE in the last 168 hours. No results for input(s): AMMONIA in the last 168 hours. Coagulation Profile: Recent Labs  Lab 10/13/17 1433  INR 1.30   Cardiac Enzymes: No results for input(s): CKTOTAL, CKMB, CKMBINDEX, TROPONINI in the last 168 hours. BNP (last 3 results) No results for input(s): PROBNP in the last 8760 hours. HbA1C: No results for input(s): HGBA1C in the last 72 hours. CBG: Recent Labs  Lab 10/14/17 0726  GLUCAP 81   Lipid Profile: No results for input(s): CHOL, HDL, LDLCALC, TRIG, CHOLHDL, LDLDIRECT in the last 72 hours. Thyroid Function Tests: No results for input(s): TSH, T4TOTAL, FREET4, T3FREE, THYROIDAB in the last 72 hours. Anemia Panel: No results for input(s): VITAMINB12, FOLATE, FERRITIN, TIBC, IRON, RETICCTPCT in the  last 72 hours. Sepsis Labs: Recent Labs  Lab 10/13/17 1448  LATICACIDVEN 1.46    Recent Results (from the past 240 hour(s))  Culture, blood (routine x 2)     Status: Abnormal   Collection Time: 10/13/17  2:33 PM  Result Value Ref Range Status   Specimen Description   Final    BLOOD LEFT ANTECUBITAL Performed at [place] [address], [place], [place] [address]    Special Requests   Final    BOTTLES DRAWN AEROBIC AND ANAEROBIC Blood Culture adequate volume Performed at [place] [facility] [address], [place], [place] [address]    Culture  Setup Time   Final    GRAM POSITIVE COCCI IN CLUSTERS IN BOTH AEROBIC AND ANAEROBIC BOTTLES CRITICAL RESULT CALLED TO, READ BACK BY AND VERIFIED WITH: Melodye Ped PharmD 13:35 10/14/17 ([email or web address])    Culture (A)  Final     STAPHYLOCOCCUS SPECIES (COAGULASE NEGATIVE) THE SIGNIFICANCE OF ISOLATING THIS ORGANISM FROM A SINGLE SET OF BLOOD CULTURES WHEN MULTIPLE SETS ARE DRAWN IS UNCERTAIN. PLEASE NOTIFY THE MICROBIOLOGY DEPARTMENT WITHIN ONE WEEK IF SPECIATION AND SENSITIVITIES ARE REQUIRED. Performed at [facility], [place] [address], [place], [place] [address]    Report Status 10/16/2017 FINAL  Final  Blood Culture ID Panel (Reflexed)     Status: Abnormal   Collection Time: 10/13/17  2:33 PM  Result Value Ref Range Status   Enterococcus species NOT DETECTED NOT DETECTED Final   Listeria monocytogenes NOT DETECTED NOT DETECTED Final   Staphylococcus species DETECTED (A) NOT DETECTED Final    Comment: Methicillin (oxacillin) resistant coagulase negative staphylococcus. Possible blood culture contaminant (unless isolated from more than one blood culture draw or clinical case suggests pathogenicity). No antibiotic treatment is indicated for blood  culture contaminants. CRITICAL RESULT CALLED TO, READ BACK BY AND VERIFIED WITH: Melodye Ped PharmD 13:35 10/14/17 ([email or web address])    Staphylococcus aureus NOT DETECTED NOT DETECTED Final   Methicillin resistance DETECTED (A) NOT DETECTED Final    Comment: CRITICAL RESULT CALLED TO, READ BACK BY AND VERIFIED WITH: Melodye Ped PharmD 13:35 10/14/17 ([email or web address])    Streptococcus species NOT DETECTED NOT DETECTED Final   Streptococcus agalactiae NOT DETECTED NOT DETECTED Final   Streptococcus pneumoniae NOT DETECTED NOT DETECTED Final   Streptococcus pyogenes NOT DETECTED NOT DETECTED Final   Acinetobacter baumannii NOT DETECTED NOT DETECTED Final   Enterobacteriaceae species NOT DETECTED NOT DETECTED Final   Enterobacter cloacae complex NOT DETECTED NOT DETECTED Final   Escherichia coli NOT DETECTED NOT DETECTED Final   Klebsiella oxytoca NOT DETECTED NOT DETECTED Final   Klebsiella pneumoniae NOT DETECTED NOT DETECTED Final   Proteus species NOT DETECTED NOT DETECTED Final    Serratia marcescens NOT DETECTED NOT DETECTED Final   Haemophilus influenzae NOT DETECTED NOT DETECTED Final   Neisseria meningitidis NOT DETECTED NOT DETECTED Final   Pseudomonas aeruginosa NOT DETECTED NOT DETECTED Final   Candida albicans NOT DETECTED NOT DETECTED Final   Candida glabrata NOT DETECTED NOT DETECTED Final   Candida krusei NOT DETECTED NOT DETECTED Final   Candida parapsilosis NOT DETECTED NOT DETECTED Final   Candida tropicalis NOT DETECTED NOT DETECTED Final  Culture, blood (routine x 2)     Status: None   Collection Time: 10/13/17  3:43 PM  Result Value Ref Range Status   Specimen Description   Final    BLOOD LEFT HAND Performed at [facility], [address] [place], [place]  [address]    Special Requests   Final    BOTTLES DRAWN AEROBIC AND ANAEROBIC Blood Culture adequate volume Performed at [place] [address] [place], [place] [address]    Culture   Final    NO GROWTH 5 DAYS Performed at [facility], [place] [address] [place], [place] [address]    Report Status 10/18/2017 FINAL  Final  Culture, sputum-assessment     Status: None   Collection Time: 10/13/17 10:12 PM  Result Value Ref Range Status   Specimen Description EXPECTORATED SPUTUM  Final   Special Requests Immunocompromised  Final   Sputum evaluation   Final    THIS SPECIMEN IS ACCEPTABLE FOR SPUTUM CULTURE Performed at [facility], [place] [address], [place], [place] [address]    Report Status 10/13/2017 FINAL  Final  Culture, respiratory (NON-Expectorated)     Status: None   Collection Time: 10/13/17 10:12 PM  Result Value Ref Range Status   Specimen Description   Final    EXPECTORATED SPUTUM Performed at [facility], [place] [address], [place], [place] [address]    Special Requests   Final    Immunocompromised Reflexed from [id number] Performed at [facility], [place] [address], [place], [place]  [address]    Gram Stain   Final    ABUNDANT WBC PRESENT, PREDOMINANTLY PMN FEW GRAM POSITIVE COCCI FEW GRAM POSITIVE RODS FEW GRAM NEGATIVE COCCOBACILLI RARE YEAST    Culture   Final    MODERATE Consistent with normal respiratory flora. Performed at [facility], [place] [address] [place], [place] [address]    Report Status 10/16/2017 FINAL  Final  Body fluid culture     Status: None   Collection Time: 10/14/17  1:51 PM  Result Value Ref Range Status   Specimen Description   Final    PLEURAL RIGHT Performed at [place] [address], [place], [place] [address]    Special Requests   Final    NONE Performed at [facility], [place] [address], [place], [place] [address]    Gram Stain   Final    ABUNDANT WBC PRESENT,BOTH PMN AND MONONUCLEAR NO ORGANISMS SEEN    Culture   Final    NO GROWTH 3 DAYS Performed at [facility], [place] [address] [place],  [address]    Report Status 10/17/2017 FINAL  Final         Radiology Studies: Ct Chest W Contrast  Result Date: 10/17/2017 CLINICAL DATA:  Follow-up pleural effusion. EXAM: CT CHEST WITH CONTRAST TECHNIQUE: Multidetector CT imaging of the chest was performed during intravenous contrast administration. CONTRAST:  58mL OMNIPAQUE IOHEXOL 300 MG/ML  SOLN COMPARISON:  Chest CT 09/21/2017 FINDINGS: Cardiovascular: Coronary artery calcification and aortic atherosclerotic calcification. Mediastinum/Nodes: No axillary supraclavicular adenopathy. No mediastinal or hilar adenopathy. No pericardial effusion. There is a large hiatal hernia. There is fluid-filled esophagus. Esophagus GE L air and fluid extends to above the carinal level. (Image 57/2 for example). Lungs/Pleura: Large bilateral pleural effusions with passive atelectasis the LEFT or RIGHT lower lobe is similar to comparison exam. There is patchy ground-glass opacities the upper lobes with bronchiectasis. There is nodular thickening  along the superior aspect of the LEFT oblique fissure similar to comparison exam. Increased branching nodular airspace disease in the LEFT upper lobe (image 39/7). Upper Abdomen: Limited view of the liver, kidneys, pancreas are unremarkable. Normal adrenal glands. Large bilateral  renal cysts Musculoskeletal: No aggressive osseous lesion. Significant degenerate change at the cervical spine. Inflammation surrounding the no chronic RIGHT shoulder dislocation. IMPRESSION: 1. No change in large bilateral pleural effusions with bibasilar passive atelectasis. 2. Increase in nodular airspace disease in the LEFT upper lobe consistent with pulmonary infection or aspiration. 3. Persistent nodular thickening along the LEFT oblique fissure and some consolidation superior segment LEFT lower lobe similar to comparison exams. 4. Patchy ground-glass opacities in the RIGHT upper lobe are also increased consistent with infection or inflammation. 5. Esophagus is fluid-filled to above the level the carina. Findings suggest severe gastroesophageal reflux and could contribute to aspiration pneumonitis. Hiatal hernia noted. Aortic Atherosclerosis (ICD10-I70.0). Electronically Signed   By: Suzy Bouchard M.D.   On: 10/17/2017 16:09        Scheduled Meds: . budesonide (PULMICORT) nebulizer solution  0.5 mg Nebulization BID  . feeding supplement (ENSURE ENLIVE)  237 mL Oral BID BM  . ferrous sulfate  325 mg Oral Daily  . fluticasone  2 spray Each Nare Daily  . furosemide  20 mg Intravenous BID  . gabapentin  300 mg Oral BID  . guaiFENesin  1,200 mg Oral BID  . ipratropium-albuterol  3 mL Nebulization Once  . ipratropium-albuterol  3 mL Nebulization TID  . latanoprost  1 drop Both Eyes QHS  . loratadine  10 mg Oral Daily  . multivitamin with minerals  1 tablet Oral Daily  . nystatin   Topical TID  . pantoprazole  40 mg Oral BID AC  . pramipexole  1 mg Oral TID  . rivaroxaban  20 mg Oral Q supper  . senna-docusate  2  tablet Oral QHS  . silver sulfADIAZINE   Topical Daily  . sodium chloride flush  3 mL Intravenous Q12H   Continuous Infusions: . sodium chloride 10 mL/hr at 10/19/17 0329  . ceFEPime (MAXIPIME) IV Stopped (10/19/17 0545)     LOS: 6 days    Time spent: 35 minutes    Irine Seal, MD [facility] Pager [phone]  If 7PM-7AM, please contact night-coverage [email or web address] Password [email or web address] 10/19/2017, 12:49 PM

## 2017-10-19 NOTE — Progress Notes (Signed)
saturationsatSATURATION QUALIFICATIONS: (This note is used to comply with regulatory documentation for home oxygen)  Patient Saturations on Room Air at Rest 79%  Patient Saturations on Room Air while Ambulating  Patient Saturations on Liters of oxygen while Ambulating   Please briefly explain why patient needs home oxygen: pt is paralyized  Can not ambulate. Needs O2  R/T 79 % o2 sat

## 2017-10-20 DIAGNOSIS — I825Y9 Chronic embolism and thrombosis of unspecified deep veins of unspecified proximal lower extremity: Secondary | ICD-10-CM

## 2017-10-20 MED ORDER — GUAIFENESIN ER 600 MG PO TB12: 1200.0000 mg | ORAL_TABLET | Freq: Two times a day (BID) | ORAL | 0 refills | Status: AC

## 2017-10-20 MED ORDER — GLYCOPYRROLATE 2 MG PO TABS: 2.0000 mg | ORAL_TABLET | Freq: Three times a day (TID) | ORAL | 0 refills | Status: AC | PRN

## 2017-10-20 MED ORDER — AMOXICILLIN-POT CLAVULANATE 875-125 MG PO TABS: 1.0000 | ORAL_TABLET | Freq: Two times a day (BID) | ORAL | 0 refills | Status: AC

## 2017-10-20 MED ORDER — NYSTATIN 100000 UNIT/GM EX POWD: Freq: Three times a day (TID) | CUTANEOUS | 0 refills | Status: AC

## 2017-10-20 MED ORDER — SILVER SULFADIAZINE 1 % EX CREA: TOPICAL_CREAM | Freq: Every day | CUTANEOUS | 0 refills | Status: AC

## 2017-10-20 MED ORDER — IPRATROPIUM-ALBUTEROL 0.5-2.5 (3) MG/3ML IN SOLN: 3.0000 mL | Freq: Four times a day (QID) | RESPIRATORY_TRACT | 0 refills | Status: AC | PRN

## 2017-10-20 MED ORDER — LEVOFLOXACIN 500 MG PO TABS: 500.0000 mg | ORAL_TABLET | Freq: Every day | ORAL | 0 refills | Status: AC

## 2017-10-20 MED ORDER — LORATADINE 10 MG PO TABS: 10.0000 mg | ORAL_TABLET | Freq: Every day | ORAL | Status: AC

## 2017-10-20 MED ORDER — GABAPENTIN 300 MG PO CAPS: 300.0000 mg | ORAL_CAPSULE | Freq: Two times a day (BID) | ORAL | 0 refills | Status: AC

## 2017-10-20 MED ORDER — BUDESONIDE-FORMOTEROL FUMARATE 160-4.5 MCG/ACT IN AERO: 2.0000 | INHALATION_SPRAY | Freq: Two times a day (BID) | RESPIRATORY_TRACT | 0 refills | Status: AC

## 2017-10-20 MED ORDER — ESOMEPRAZOLE MAGNESIUM 40 MG PO CPDR: 40.0000 mg | DELAYED_RELEASE_CAPSULE | Freq: Two times a day (BID) | ORAL | 0 refills | Status: AC

## 2017-10-20 MED ORDER — HYDROCODONE-ACETAMINOPHEN 5-325 MG PO TABS: 1.0000 | ORAL_TABLET | Freq: Four times a day (QID) | ORAL | 0 refills | Status: AC | PRN

## 2017-10-20 MED ORDER — FLUTICASONE PROPIONATE 50 MCG/ACT NA SUSP: 2.0000 | Freq: Every day | NASAL | 0 refills | Status: AC

## 2017-10-20 NOTE — Progress Notes (Signed)
Medical Necessity Form filled out and printed for nursing staff to Call [facility] when transport needed for dc home. Marney Doctor RN,BSN,NCM [phone]

## 2017-10-20 NOTE — Progress Notes (Signed)
[facility]  Notified of d/c

## 2017-10-20 NOTE — Care Management Important Message (Signed)
Important Message  Patient Details  Name: Derrick Sanchez MRN: [id number] Date of Birth: 1930-10-28   Medicare Important Message Given:  Yes    Kerin Salen 10/20/2017, 1:48 PMImportant Message  Patient Details  Name: Derrick Sanchez MRN: [id number] Date of Birth: [birth year]   Medicare Important Message Given:  Yes    Kerin Salen 10/20/2017, 1:48 PM

## 2017-10-20 NOTE — Progress Notes (Signed)
[facility] Liaison RN note.  Spoke with son, [name]. All DME has been delivered and family is ready for patient when he discharges to home.  Please call with any hospice related questions or concerns.  Thank you,  Farrel Gordon, RN, [facility] Liaison [place] are on AMION

## 2017-10-20 NOTE — Progress Notes (Signed)
Pt was d/c via [facility]. I  Notified  The PT SON OF THE DISCHARGE

## 2017-10-20 NOTE — Discharge Summary (Signed)
Physician Discharge Summary  Derrick Sanchez:[id number] DOB: 12-04-1930 DOA: 10/13/2017  PCP: Derrick Noble, MD  Admit date: 10/13/2017 Discharge date: 10/20/2017  Time spent: 65 minutes  Recommendations for Outpatient Follow-up:  1. Will be discharged home with hospice following.  Patient is to follow-up with hospice MD. 2. Follow-up with PCP in 2 weeks.   Discharge Diagnoses:  Principal Problem:   Acute on chronic respiratory failure with hypoxia (HCC) Active Problems:   Empyema lung (HCC)   Pleural effusion   HCAP (healthcare-associated pneumonia)   Bacteremia due to Gram-positive bacteria   GASTROESOPHAGEAL REFLUX DISEASE   Protein-calorie malnutrition, severe (HCC)   Essential hypertension   Pressure injury of skin   At high risk for aspiration   Pleural effusion on left   Deep vein thrombosis (DVT) of lower extremity ([place])   Discharge Condition: Stable and improved  Diet recommendation: Dysphagia 1 diet  Filed Weights   10/17/17 2046 10/18/17 0500 10/19/17 0509  Weight: 69.4 kg (153 lb) 68.7 kg (151 lb 7.3 oz) 67.9 kg (149 lb 11.1 oz)    History of present illness:  Per Dr Derrick Sanchez is a 84 y.o. male with medical history significant of forspinal epidural abscess, lumbar radiculopathy, paraparesis of lower limbs, chronic osteomyelitis of lumbar spine, frequent falls, malnutrition, failure to thrive who presented [facility] ED [facility] from SNF with complaints of dyspnea and persistent productive cough.  Patient had recent admission and was treated for pneumonia as well as pleural effusion.  Presented with similar complaints  ED Course: Patient was found to have recurring left pleural effusion and possible pneumonia, hospitalists are consulted for further evaluation and recommendations     Hospital Course:  1 acute on chronic respiratory failure with hypoxia secondary to healthcare associated pneumonia and recurrent left sided pleural effusion/parapneumonic  effusion Patient had presented acute on chronic respiratory failure chest x-ray consistent with healthcare associated pneumonia and recurrent left-sided pleural effusion.  Patient recently hospitalized with similar symptoms.  Patient with increased O2 requirements the night of admission patient was requiring nonrebreather.  Hypoxia improved slowly and rhonchorous breath sounds slowly improved with couple doses of IV diuretics.  Patient placed back on home regimen of oral diuretics.  Patient given fluid bolus and some IV fluids early on in the hospitalization, due to borderline blood pressure and patient's diuretics discontinued.  Patient status post left-sided thoracentesis with 200 cc fluid removed.  Body fluids consistent with mixed transudate and  exudate and probable parapneumonic effusion..  Patient has had recurrent left-sided pleural effusions times approximately 3 over the past month requiring hospitalizations.  Blood cultures with 2/4 with gram-positive bacteremia consistent with coagulase-negative staph which is likely contaminant.  Patient was initially placed empirically on IV vancomycin IV cefepime.  IV Vanco subsequently discontinued and patient maintained on IV cefepime throughout the hospitalization.  Mucinex, scheduled nebs, PPI, Flonase, Claritin also given to the patient. Due to concern for empyema/recurrent left-sided pleural effusions over the past month cardiothoracic surgery was consulted on 10/15/2017 however nobody assessed the patient.  Called and spoke with Dr. Roxan Hockey of cardiothoracic surgery on 10/16/2017 who stated that patient could likely not undergo any surgical procedures at this time, recommended we get a repeat CT chest and recommended that we have pulmonary assess the patient to deal with patient's empyema as they had a presence here at [facility] and possible recommendation of pigtail catheter placement and probable thrombolytics if loculated effusion noted on CT  chest.  Patient seen  in consultation by pulmonary on 10/16/2017 who is in agreement with CT chest and further recommendations pending CT chest results.  Per pulmonary CT images reviewed body fluid analysis reviewed from left thoracentesis on 10/14/2017 and feel patient has a relatively small uncomplicated parapneumonic effusion with no current indication for chest tube at this time.  Patient improved clinically.  [name] was added to his regimen as needed for secretions.  Patient was discharged home on 3 more days of oral Augmentin to complete a course of antibiotic treatment.  Patient's home regimen of Levaquin for suppressive therapy will be resumed after antibiotic course is completed.  Patient will be discharged home with hospice.  Patient will follow-up with hospice MD.  2.  Bacteremia due to gram-positive bacteria Preliminary blood cultures 2/4 with gram-positive bacteremia/coagulase-negative Staphylococcus.  Likely contamination.  3.  Gastroesophageal reflux disease PPI increased to twice daily.   4.  Hypertension Blood pressure was borderline and as such diuretics were initially discontinued.  Blood pressure has improved.  Patient was resumed back on his diuretics.  Outpatient follow-up.   5.  Stage II sacral pressure injury/unstageable pressure injury of left hip and left ischium/multiple abrasions to lower back and bilateral buttocks/left posterior leg with healing full-thickness wound Patient has been seen by wound care nurse and recommendations made.  #6 severe protein calorie malnutrition Patient placed on nutritional supplementation.    7.  Groin candidiasis Nystatin powder.  8.  Diastolic heart failure 2D echo done 09/22/2017 with a EF of 60 to 24%, grade 1 diastolic dysfunction.  Diuretics were initially discontinued secondary to borderline/low blood pressure.  Diuretics was subsequently resumed. Patient with rhonchorous gurgly breath sounds noted on examination with likely  increased secretions.  IV fluids were subsequently saline lock.  Patient noted to have bouts of hypoxia.  Patient was given a few doses of IV Lasix with good urine output and clinical improvement and subsequently resumed back on his home regimen of oral Lasix.    9.  Chronic low back pain/history of chronic vertebral osteomyelitis Patient on chronic suppressive therapy with Levaquin.  Levaquin not resumed during this hospitalization as patient on broad-spectrum IV antibiotics. Resume Levaquin once acute antibiotics have been discontinued on 10/24/2017.   10.  History of left lower extremity DVT Was on Xarelto at home however due to thoracentesis patient placed on heparin.  Was maintained on IV heparin during the hospitalization and after it was determined no further procedures were needed patient was placed back on his home regimen of Xarelto.  Outpatient follow-up.    #11 anemia/iron deficiency anemia Patient with no overt bleeding.  Anemia panel consistent with iron deficiency anemia with iron level of 8 and a ferritin of 107.  B12 level of 783.  Hemoglobin stable at 8.6.    12.  Severe protein calorie malnutrition Nutritional supplementation.  13 prognosis Patient with a poor prognosis multiple admissions over the past month x3 with recurrent pleural effusions and pneumonia, history of spinal epidural abscess on chronic suppressive antibiotics, chronic osteomyelitis of the lumbar spine, failure to thrive, frequent falls, severe protein calorie malnutrition now with an empyema.  Patient on empiric IV antibiotics.  Palliative care consultation has been done and palliative care following.  Patient is also being followed by hospice liason.  Patient likely home with hospice following.      Procedures:  Chest x-ray 10/13/2017  Ultrasound-guided diagnostic and therapeutic left thoracentesis with 200 cc removed 10/14/2017 per Rowe Robert, PA/Dr. [facility]  CT chest  10/17/2017  Consultations:  Interventional radiology  Pulmonary: Dr.Wert 10/16/2017      Discharge Exam: Vitals:   10/20/17 1335 10/20/17 1418  BP: 109/75   Pulse: 74   Resp: 14   Temp: 98 F (36.7 C)   SpO2: 97% 97%    General: NAD Cardiovascular: RRR Respiratory: Minimal rhonchi. No wheezing. No crackles.  Discharge Instructions   Discharge Instructions    Diet - low sodium heart healthy   Complete by:  As directed    Dysphagia 1 diet   Increase activity slowly   Complete by:  As directed      Allergies as of 10/20/2017   No Known Allergies     Medication List    STOP taking these medications   ondansetron 4 MG tablet Commonly known as:  ZOFRAN   predniSONE 10 MG tablet Commonly known as:  DELTASONE     TAKE these medications   amoxicillin-clavulanate 875-125 MG tablet Commonly known as:  AUGMENTIN Take 1 tablet by mouth 2 (two) times daily for 3 days.   B-complex with vitamin C tablet Take 1 tablet by mouth daily.   bimatoprost 0.01 % Soln Commonly known as:  LUMIGAN Place 1 drop into both eyes at bedtime.   budesonide-formoterol 160-4.5 MCG/ACT inhaler Commonly known as:  SYMBICORT Inhale 2 puffs into the lungs 2 (two) times daily.   esomeprazole 40 MG capsule Commonly known as:  NEXIUM Take 1 capsule (40 mg total) by mouth 2 (two) times daily before a meal. What changed:  when to take this   ferrous sulfate 325 (65 FE) MG tablet Take 1 tablet by mouth daily.   fluticasone 50 MCG/ACT nasal spray Commonly known as:  FLONASE Place 2 sprays into both nostrils daily. Start taking on:  10/21/2017   furosemide 20 MG tablet Commonly known as:  LASIX Take 1 tablet (20 mg total) by mouth 2 (two) times daily.   gabapentin 300 MG capsule Commonly known as:  NEURONTIN Take 1 capsule (300 mg total) by mouth 2 (two) times daily. Take 1 capsules (300 mg) every morning, 2 capsules (600 mg) at bedtime What changed:  how much to take    glycopyrrolate 2 MG tablet Commonly known as:  [name] Take 1 tablet (2 mg total) by mouth 3 (three) times daily as needed (increased secretions).   guaiFENesin 600 MG 12 hr tablet Commonly known as:  MUCINEX Take 2 tablets (1,200 mg total) by mouth 2 (two) times daily for 3 days.   HYDROcodone-acetaminophen 5-325 MG tablet Commonly known as:  NORCO/VICODIN Take 1 tablet by mouth every 6 (six) hours as needed for severe pain.   hyoscyamine 0.125 MG tablet Commonly known as:  LEVSIN, ANASPAZ Take 0.125 mg by mouth once.   ipratropium-albuterol 0.5-2.5 (3) MG/3ML Soln Commonly known as:  DUONEB Take 3 mLs by nebulization every 6 (six) hours as needed. Use 3 times daily x 3 days then every 6 hours as needed. What changed:  additional instructions   lactobacillus Pack Take 1 packet (1 g total) by mouth 3 (three) times daily with meals.   lactose free nutrition Liqd Take 237 mLs by mouth 3 (three) times daily between meals.   levofloxacin 500 MG tablet Commonly known as:  LEVAQUIN Take 1 tablet (500 mg total) by mouth daily. Start taking on:  10/24/2017 What changed:  These instructions start on 10/24/2017. If you are unsure what to do until then, ask your doctor or other care provider.   loratadine 10 MG tablet  Commonly known as:  CLARITIN Take 1 tablet (10 mg total) by mouth daily. Start taking on:  10/21/2017   multivitamin with minerals Tabs tablet Take 1 tablet by mouth daily.   nystatin powder Commonly known as:  MYCOSTATIN/NYSTOP Apply topically 3 (three) times daily for 4 days.   pramipexole 1 MG tablet Commonly known as:  MIRAPEX Take 1 mg by mouth 3 (three) times daily.   SANTYL EX Apply 1 application topically daily.   senna-docusate 8.6-50 MG tablet Commonly known as:  Senokot-S Take 2 tablets by mouth at bedtime.   silver sulfADIAZINE 1 % cream Commonly known as:  SILVADENE Apply topically daily. Apply Silvadene to left ischium and left hip Q day, then  cover with gauze and foam dressing. (change foam dressing Q 3 days or PRN. )  Remove previous Silvadene each time by wiping off with moist gauze before applying more.   XARELTO 20 MG Tabs tablet Generic drug:  rivaroxaban Take 20 mg by mouth daily.            Durable Medical Equipment  (From admission, onward)        Start     Ordered   10/19/17 1416  For home use only DME oxygen  Once    Question Answer Comment  Mode or (Route) Nasal cannula   Liters per Minute 3   Frequency Continuous (stationary and portable oxygen unit needed)   Oxygen conserving device Yes   Oxygen delivery system Gas      10/19/17 1415     No Known Allergies Follow-up Information    Derrick Noble, MD. Schedule an appointment as soon as possible for a visit in 2 week(s).   Specialty:  Internal Medicine Contact information: [address] [place] [address] [phone]        Hospice MD Follow up.            The results of significant diagnostics from this hospitalization (including imaging, microbiology, ancillary and laboratory) are listed below for reference.    Significant Diagnostic Studies: Dg Chest 1 View  Result Date: 10/14/2017 CLINICAL DATA:  84 year old male status post ultrasound-guided left thoracentesis today. EXAM: CHEST  1 VIEW COMPARISON:  Left chest thoracentesis images 1218 hours. Portable chest 10/13/2017 and earlier. FINDINGS: Portable AP semi upright view at 8[address]40 hours. Decreased veiling opacity in the left lower lung, mildly improved left lung base ventilation, and no pneumothorax identified following thoracentesis. Continued veiling opacity in the right lower lung, right lung base ventilation has worsened since 09/30/2017. No pneumothorax or pulmonary edema. Stable cardiac size and mediastinal contours. Visualized tracheal air column is within normal limits. Stable visible bowel gas pattern. IMPRESSION: 1. Mildly improved ventilation and no adverse features  identified following left side thoracentesis. 2. Right side pleural effusion with lower lobe collapse or consolidation has progressed since 09/30/2017. Electronically Signed   By: Genevie Ann M.D.   On: 10/14/2017 14:07   Dg Chest 1 View  Result Date: 09/28/2017 CLINICAL DATA:  Post right thoracentesis EXAM: CHEST  1 VIEW COMPARISON:  For 14 2019 FINDINGS: Significant improvement in right pleural effusion which is now very small. No pneumothorax. Improved aeration right lung base Moderate left effusion and left lower lobe airspace disease remain unchanged. Negative for heart failure or edema. Chronic right rib fractures. Chronic bilateral shoulder subluxation. IMPRESSION: Improvement in right pleural effusion without pneumothorax No interval change in left pleural effusion and left lower lobe airspace disease. Electronically Signed   By: Juanda Crumble  Carlis Abbott M.D.   On: 09/28/2017 13:03   Dg Chest 2 View  Result Date: 09/30/2017 CLINICAL DATA:  Cough, low oxygen saturation, non communicative patient EXAM: CHEST - 2 VIEW COMPARISON:  Chest x-ray of 09/29/2017 FINDINGS: The lungs are not well aerated and there has been and increase in airspace disease at both lung bases. This may be due to developing pneumonia and possible small effusions bilaterally. Also and element of congestive heart failure superimposed cannot be excluded. The heart is mildly enlarged. Old right rib fractures are present and there is a chronic dislocation of the right shoulder. IMPRESSION: 1. Poor aeration with bibasilar airspace disease. Suspect pneumonia and possible effusions but superimposed CHF cannot be excluded. 2. Stable cardiomegaly. 3. Chronic dislocation of the right shoulder. Electronically Signed   By: Ivar Drape M.D.   On: 09/30/2017 13:41   Dg Chest 2 View  Result Date: 09/29/2017 CLINICAL DATA:  Shortness of breath. EXAM: CHEST - 2 VIEW COMPARISON:  09/28/2017. FINDINGS: Mediastinum hilar structures normal. Heart size normal.  Persistent left base atelectasis/infiltrate and left pleural effusion with slight improvement from prior exam. New onset of right base atelectasis/infiltrate. Probable right pleural effusion. No pneumothorax. Heart size stable. Deformity of right ribs again noted most likely from old injury. Surgical clips upper chest. IMPRESSION: 1. Persistent left base atelectasis/infiltrate left-sided pleural effusion with slight improvement from prior exam. 2. New onset right base atelectasis/infiltrate. Probable right-sided pleural effusion. Electronically Signed   By: Marcello Moores  Register   On: 09/29/2017 09:52   Dg Chest 2 View  Result Date: 09/27/2017 CLINICAL DATA:  Shortness of breath. EXAM: CHEST - 2 VIEW COMPARISON:  Chest x-rays dated 09/24/2017 and 09/20/2017. chest CT dated 09/21/2017. FINDINGS: Bibasilar opacities are unchanged, compatible with effusions and atelectasis demonstrated on earlier chest CT. Upper lungs are now relatively clear. No pneumothorax seen. Heart size and mediastinal contours are stable. Again noted is dislocation of the RIGHT humeral head. No acute or suspicious osseous finding. IMPRESSION: 1. Stable bibasilar opacities, compatible with the pleural effusions and associated atelectasis demonstrated on earlier chest CT. No new lung findings. 2. RIGHT humeral head dislocation, as described on multiple prior reports. Electronically Signed   By: Franki Cabot M.D.   On: 09/27/2017 09:36   Dg Chest 2 View  Result Date: 09/24/2017 CLINICAL DATA:  Shortness of breath, weakness EXAM: CHEST - 2 VIEW COMPARISON:  09/20/2017; correlation interval CT chest 09/21/2017 FINDINGS: Enlargement of cardiac silhouette with vascular congestion. Atherosclerotic calcification aorta. Bibasilar effusions. Atelectasis versus consolidation LEFT lower lobe. Improved upper lobe infiltrates more so on LEFT with residual opacity in the periphery of the RIGHT upper lobe. Central peribronchial thickening. No pneumothorax.  Bones demineralized. IMPRESSION: Bibasilar effusions and atelectasis. Bronchitic changes with improved upper lobe infiltrates. Electronically Signed   By: Lavonia Dana M.D.   On: 09/24/2017 09:15   Ct Chest W Contrast  Result Date: 10/17/2017 CLINICAL DATA:  Follow-up pleural effusion. EXAM: CT CHEST WITH CONTRAST TECHNIQUE: Multidetector CT imaging of the chest was performed during intravenous contrast administration. CONTRAST:  5mL OMNIPAQUE IOHEXOL 300 MG/ML  SOLN COMPARISON:  Chest CT 09/21/2017 FINDINGS: Cardiovascular: Coronary artery calcification and aortic atherosclerotic calcification. Mediastinum/Nodes: No axillary supraclavicular adenopathy. No mediastinal or hilar adenopathy. No pericardial effusion. There is a large hiatal hernia. There is fluid-filled esophagus. Esophagus GE L air and fluid extends to above the carinal level. (Image 57/2 for example). Lungs/Pleura: Large bilateral pleural effusions with passive atelectasis the LEFT or RIGHT lower lobe is similar  to comparison exam. There is patchy ground-glass opacities the upper lobes with bronchiectasis. There is nodular thickening along the superior aspect of the LEFT oblique fissure similar to comparison exam. Increased branching nodular airspace disease in the LEFT upper lobe (image 39/7). Upper Abdomen: Limited view of the liver, kidneys, pancreas are unremarkable. Normal adrenal glands. Large bilateral renal cysts Musculoskeletal: No aggressive osseous lesion. Significant degenerate change at the cervical spine. Inflammation surrounding the no chronic RIGHT shoulder dislocation. IMPRESSION: 1. No change in large bilateral pleural effusions with bibasilar passive atelectasis. 2. Increase in nodular airspace disease in the LEFT upper lobe consistent with pulmonary infection or aspiration. 3. Persistent nodular thickening along the LEFT oblique fissure and some consolidation superior segment LEFT lower lobe similar to comparison exams. 4.  Patchy ground-glass opacities in the RIGHT upper lobe are also increased consistent with infection or inflammation. 5. Esophagus is fluid-filled to above the level the carina. Findings suggest severe gastroesophageal reflux and could contribute to aspiration pneumonitis. Hiatal hernia noted. Aortic Atherosclerosis (ICD10-I70.0). Electronically Signed   By: Suzy Bouchard M.D.   On: 10/17/2017 16:09   Ct Chest W Contrast  Result Date: 09/21/2017 CLINICAL DATA:  The patient suffered a fall transferring from a motorized scooter to bed 2 weeks ago with a blow to the right side and onset of right chest pain. Initial encounter. EXAM: CT CHEST WITH CONTRAST TECHNIQUE: Multidetector CT imaging of the chest was performed during intravenous contrast administration. CONTRAST:  75 mL OMNIPAQUE IOHEXOL 300 MG/ML  SOLN COMPARISON:  Single-view of the chest 09/20/2016 and 10/19/2015. Plain films right shoulder 09/10/2017. CT chest 02/02/2014. FINDINGS: Cardiovascular: There is mild cardiomegaly. No pericardial effusion. Extensive calcific aortic and coronary atherosclerosis is identified. Large varix right subclavian vein noted. Mediastinum/Nodes: No axillary, hilar or mediastinal lymphadenopathy. Small to moderate hiatal hernia is noted. Lungs/Pleura: The patient has moderate bilateral pleural effusions. There is associated compressive atelectasis in the posterior aspects of both lower lobes. Patchy airspace disease is identified in the superior segment of the left lower lobe. Upper Abdomen: There is partial visualization of large bilateral renal cysts which appear unchanged. No acute abnormality is identified. Musculoskeletal: The right shoulder is anteriorly dislocated. Marked anterior subluxation of the left shoulder is identified. The patient has severe cervical spondylosis with 0.5 cm anterolisthesis C5 on C6 and severe loss of disc space height at C5-6 and C6-7. The patient has multiple right rib fractures all of which  appear late subacute to remote with callus formation identified about the fractures. No acute fracture is seen. IMPRESSION: Anterior dislocation of the right shoulder as seen on comparison plain films 09/10/2017. Multiple remote right rib fractures. No acute right rib fracture is identified. Marked anterior subluxation of the left humeral head on the glenoid consistent with glenohumeral joint instability. Moderate bilateral pleural effusions. There is associated compressive atelectasis. Patchy airspace disease in the superior segment of the left lower lobe could be due to atelectasis but has an appearance worrisome for pneumonia. Extensive coronary atherosclerosis Advanced cervical spondylosis C5-6 and C6-7. Aortic Atherosclerosis (ICD10-I70.0). Electronically Signed   By: Inge Rise M.D.   On: 09/21/2017 15:49   Dg Chest Port 1 View  Result Date: 10/13/2017 CLINICAL DATA:  84 y/o  M; shortness of breath, cough, wheeze. EXAM: PORTABLE CHEST 1 VIEW COMPARISON:  09/30/2017 chest radiograph FINDINGS: Increased large left and small right pleural effusions. Associated opacification of the lungs probably represents atelectasis. Pneumonia is possible. Cardiac silhouette obscured by the left-sided effusion. Aortic atherosclerosis.  Stable background of chronic lung disease. Bones are demineralized and there are chronic rib fractures on the right. IMPRESSION: Increased large left and small right pleural effusions with associated lung opacities probably representing atelectasis. Underlying pneumonia is possible. Aortic atherosclerosis. Electronically Signed   By: Kristine Garbe M.D.   On: 10/13/2017 14:46   US Thoracentesis Asp Pleural Space W/img Guide  Result Date: 10/14/2017 INDICATION: Patient with history of pneumonia, heart failure, failure to thrive, bilateral pleural effusions; request made for diagnostic and therapeutic left thoracentesis. EXAM: ULTRASOUND GUIDED DIAGNOSTIC AND THERAPEUTIC LEFT  THORACENTESIS MEDICATIONS: None COMPLICATIONS: None immediate. PROCEDURE: An ultrasound guided thoracentesis was thoroughly discussed with the patient and questions answered. The benefits, risks, alternatives and complications were also discussed. The patient understands and wishes to proceed with the procedure. Written consent was obtained. Ultrasound was performed to localize and mark an adequate pocket of fluid in the left chest. The area was then prepped and draped in the normal sterile fashion. 1% Lidocaine was used for local anesthesia. Under ultrasound guidance a 6 Fr Safe-T-Centesis catheter was introduced. Thoracentesis was performed. The catheter was removed and a dressing applied. FINDINGS: A total of approximately 200 cc of slightly hazy, yellow fluid was removed. Samples were sent to the laboratory as requested by the clinical team. IMPRESSION: Successful ultrasound guided diagnostic and therapeutic left thoracentesis yielding 200 cc of pleural fluid. Follow-up chest x-ray revealed no pneumothorax. Read by: Rowe Robert, PA-C Electronically Signed   By: Marybelle Killings M.D.   On: 10/14/2017 14:11   US Thoracentesis Asp Pleural Space W/img Guide  Result Date: 09/28/2017 INDICATION: Pneumonia, dyspnea, bilateral pleural effusions. Request made for therapeutic right thoracentesis. EXAM: ULTRASOUND GUIDED THERAPEUTIC RIGHT THORACENTESIS MEDICATIONS: None COMPLICATIONS: None immediate. PROCEDURE: An ultrasound guided thoracentesis was thoroughly discussed with the patient/patient's son and questions answered. The benefits, risks, alternatives and complications were also discussed. The patient understands and wishes to proceed with the procedure. Written consent was obtained. Ultrasound was performed to localize and mark an adequate pocket of fluid in the right chest. The area was then prepped and draped in the normal sterile fashion. 1% Lidocaine was used for local anesthesia. Under ultrasound guidance a 6  Fr Safe-T-Centesis catheter was introduced. Thoracentesis was performed. The catheter was removed and a dressing applied. FINDINGS: A total of approximately 750 cc of yellow fluid was removed. IMPRESSION: Successful ultrasound guided therapeutic right thoracentesis yielding 750 cc of pleural fluid. Follow-up chest x-ray revealed no pneumothorax. Read by: Rowe Robert, PA-C Electronically Signed   By: Lucrezia Europe M.D.   On: 09/28/2017 13:15    Microbiology: Recent Results (from the past 240 hour(s))  Culture, blood (routine x 2)     Status: Abnormal   Collection Time: 10/13/17  2:33 PM  Result Value Ref Range Status   Specimen Description   Final    BLOOD LEFT ANTECUBITAL Performed at [facility] [address] [place], [place] [address]    Special Requests   Final    BOTTLES DRAWN AEROBIC AND ANAEROBIC Blood Culture adequate volume Performed at [facility] [address] [place], [place] [address]    Culture  Setup Time   Final    GRAM POSITIVE COCCI IN CLUSTERS IN BOTH AEROBIC AND ANAEROBIC BOTTLES CRITICAL RESULT CALLED TO, READ BACK BY AND VERIFIED WITH: Melodye Ped PharmD 13:35 10/14/17 ([email or web address])    Culture (A)  Final    STAPHYLOCOCCUS SPECIES (COAGULASE NEGATIVE) THE SIGNIFICANCE OF ISOLATING THIS ORGANISM FROM  A SINGLE SET OF BLOOD CULTURES WHEN MULTIPLE SETS ARE DRAWN IS UNCERTAIN. PLEASE NOTIFY THE MICROBIOLOGY DEPARTMENT WITHIN ONE WEEK IF SPECIATION AND SENSITIVITIES ARE REQUIRED. Performed at [facility], [place] [address] [place], [place] [address]    Report Status 10/16/2017 FINAL  Final  Blood Culture ID Panel (Reflexed)     Status: Abnormal   Collection Time: 10/13/17  2:33 PM  Result Value Ref Range Status   Enterococcus species NOT DETECTED NOT DETECTED Final   Listeria monocytogenes NOT DETECTED NOT DETECTED Final   Staphylococcus species DETECTED (A) NOT DETECTED Final    Comment: Methicillin (oxacillin) resistant  coagulase negative staphylococcus. Possible blood culture contaminant (unless isolated from more than one blood culture draw or clinical case suggests pathogenicity). No antibiotic treatment is indicated for blood  culture contaminants. CRITICAL RESULT CALLED TO, READ BACK BY AND VERIFIED WITH: Melodye Ped PharmD 13:35 10/14/17 ([email or web address])    Staphylococcus aureus NOT DETECTED NOT DETECTED Final   Methicillin resistance DETECTED (A) NOT DETECTED Final    Comment: CRITICAL RESULT CALLED TO, READ BACK BY AND VERIFIED WITH: Melodye Ped PharmD 13:35 10/14/17 ([email or web address])    Streptococcus species NOT DETECTED NOT DETECTED Final   Streptococcus agalactiae NOT DETECTED NOT DETECTED Final   Streptococcus pneumoniae NOT DETECTED NOT DETECTED Final   Streptococcus pyogenes NOT DETECTED NOT DETECTED Final   Acinetobacter baumannii NOT DETECTED NOT DETECTED Final   Enterobacteriaceae species NOT DETECTED NOT DETECTED Final   Enterobacter cloacae complex NOT DETECTED NOT DETECTED Final   Escherichia coli NOT DETECTED NOT DETECTED Final   Klebsiella oxytoca NOT DETECTED NOT DETECTED Final   Klebsiella pneumoniae NOT DETECTED NOT DETECTED Final   Proteus species NOT DETECTED NOT DETECTED Final   Serratia marcescens NOT DETECTED NOT DETECTED Final   Haemophilus influenzae NOT DETECTED NOT DETECTED Final   Neisseria meningitidis NOT DETECTED NOT DETECTED Final   Pseudomonas aeruginosa NOT DETECTED NOT DETECTED Final   Candida albicans NOT DETECTED NOT DETECTED Final   Candida glabrata NOT DETECTED NOT DETECTED Final   Candida krusei NOT DETECTED NOT DETECTED Final   Candida parapsilosis NOT DETECTED NOT DETECTED Final   Candida tropicalis NOT DETECTED NOT DETECTED Final  Culture, blood (routine x 2)     Status: None   Collection Time: 10/13/17  3:43 PM  Result Value Ref Range Status   Specimen Description   Final    BLOOD LEFT HAND Performed at [facility], [address] [place], [place] [address]     Special Requests   Final    BOTTLES DRAWN AEROBIC AND ANAEROBIC Blood Culture adequate volume Performed at [facility] [address], [place], [place] [address]    Culture   Final    NO GROWTH 5 DAYS Performed at [facility], [place] [address], [place], [place] [address]    Report Status 10/18/2017 FINAL  Final  Culture, sputum-assessment     Status: None   Collection Time: 10/13/17 10:12 PM  Result Value Ref Range Status   Specimen Description EXPECTORATED SPUTUM  Final   Special Requests Immunocompromised  Final   Sputum evaluation   Final    THIS SPECIMEN IS ACCEPTABLE FOR SPUTUM CULTURE Performed at [facility], [place] [address], [place], [place] [address]    Report Status 10/13/2017 FINAL  Final  Culture, respiratory (NON-Expectorated)     Status: None   Collection Time: 10/13/17 10:12 PM  Result Value Ref Range Status  Specimen Description   Final    EXPECTORATED SPUTUM Performed at [facility] [address] [place], [place] [address]    Special Requests   Final    Immunocompromised Reflexed from [phone] Performed at [facility], [place] [address] [place], [place] [address]    Gram Stain   Final    ABUNDANT WBC PRESENT, PREDOMINANTLY PMN FEW GRAM POSITIVE COCCI FEW GRAM POSITIVE RODS FEW GRAM NEGATIVE COCCOBACILLI RARE YEAST    Culture   Final    MODERATE Consistent with normal respiratory flora. Performed at [facility], [place] [address] [place], [place] [address]    Report Status 10/16/2017 FINAL  Final  Body fluid culture     Status: None   Collection Time: 10/14/17  1:51 PM  Result Value Ref Range Status   Specimen Description   Final    PLEURAL RIGHT Performed at [facility] [address] [place], [place] [address]    Special Requests   Final    NONE Performed at [facility], [place] [address] [place], [place] [address]     Gram Stain   Final    ABUNDANT WBC PRESENT,BOTH PMN AND MONONUCLEAR NO ORGANISMS SEEN    Culture   Final    NO GROWTH 3 DAYS Performed at [facility], [place] [address] [place], [place] [address]    Report Status 10/17/2017 FINAL  Final     Labs: Basic Metabolic Panel: Recent Labs  Lab 10/14/17 0938 10/15/17 0605 10/16/17 0407 10/17/17 0357 10/18/17 0405 10/19/17 0410  NA 139 137 138 135 135 135  K 3.0* 3.8 3.6 3.6 3.5 4.0  CL 106 107 107 103 103 103  CO2 24 24 23  21* 24 23  GLUCOSE 124* 94 81 73 78 76  BUN 26* 24* 21* 18 17 21*  CREATININE 1.00 0.83 0.73 0.70 0.69 0.80  CALCIUM 7.2* 7.2* 7.4* 7.5* 7.3* 7.5*  MG 1.7 2.4 2.1  --   --   --    Liver Function Tests: Recent Labs  Lab 10/14/17 0938  AST 25  ALT 17  ALKPHOS 68  BILITOT 0.5  PROT 5.1*  ALBUMIN 1.6*   No results for input(s): LIPASE, AMYLASE in the last 168 hours. No results for input(s): AMMONIA in the last 168 hours. CBC: Recent Labs  Lab 10/14/17 0406 10/15/17 0605 10/16/17 0407 10/17/17 0357 10/18/17 0405  WBC 10.5 8.7 7.0 5.5 4.2  NEUTROABS  --  7.3  --   --   --   HGB 8.6* 8.7* 8.7* 9.2* 8.6*  HCT 26.7* 27.9* 27.9* 28.8* 28.0*  MCV 88.7 90.0 89.4 89.4 88.9  PLT 123* 114* 108* 103* 92*   Cardiac Enzymes: No results for input(s): CKTOTAL, CKMB, CKMBINDEX, TROPONINI in the last 168 hours. BNP: BNP (last 3 results) Recent Labs    09/22/17 0553 10/01/17 1328 10/13/17 1433  BNP 241.4* 82.4 89.4    ProBNP (last 3 results) No results for input(s): PROBNP in the last 8760 hours.  CBG: Recent Labs  Lab 10/14/17 0726  GLUCAP 81       Signed:  Irine Seal MD.  [facility] 10/20/2017, 3:38 PM

## 2017-10-20 NOTE — Consult Note (Signed)
   [facility] CM Inpatient Consult   10/20/2017  Derrick Sanchez 1930-12-31 [id number]    Patient screened for [facility] [place] Management program due to high risk of unplanned readmission score and multiple hospitalizations.   Chart review reveals patient's discharge plan is for home with hospice.   Therefore, there are no identifiable [facility] Care Management needs.    Marthenia Rolling, MSN-Ed, RN,BSN [facility] Liaison [phone]

## 2017-10-21 ENCOUNTER — Inpatient Hospital Stay: Payer: PPO | Admitting: Infectious Diseases

## 2017-11-14 DEATH — deceased

## 2019-08-28 IMAGING — DX DG CHEST 2V
2 series · 2 of 2 positions shown · non-contrast
Comparison: Chest x-rays dated 09/24/2017 and 09/20/2017. chest CT
dated 09/21/2017.

CLINICAL DATA: Shortness of breath.

EXAM:
CHEST - 2 VIEW

[chest lat]
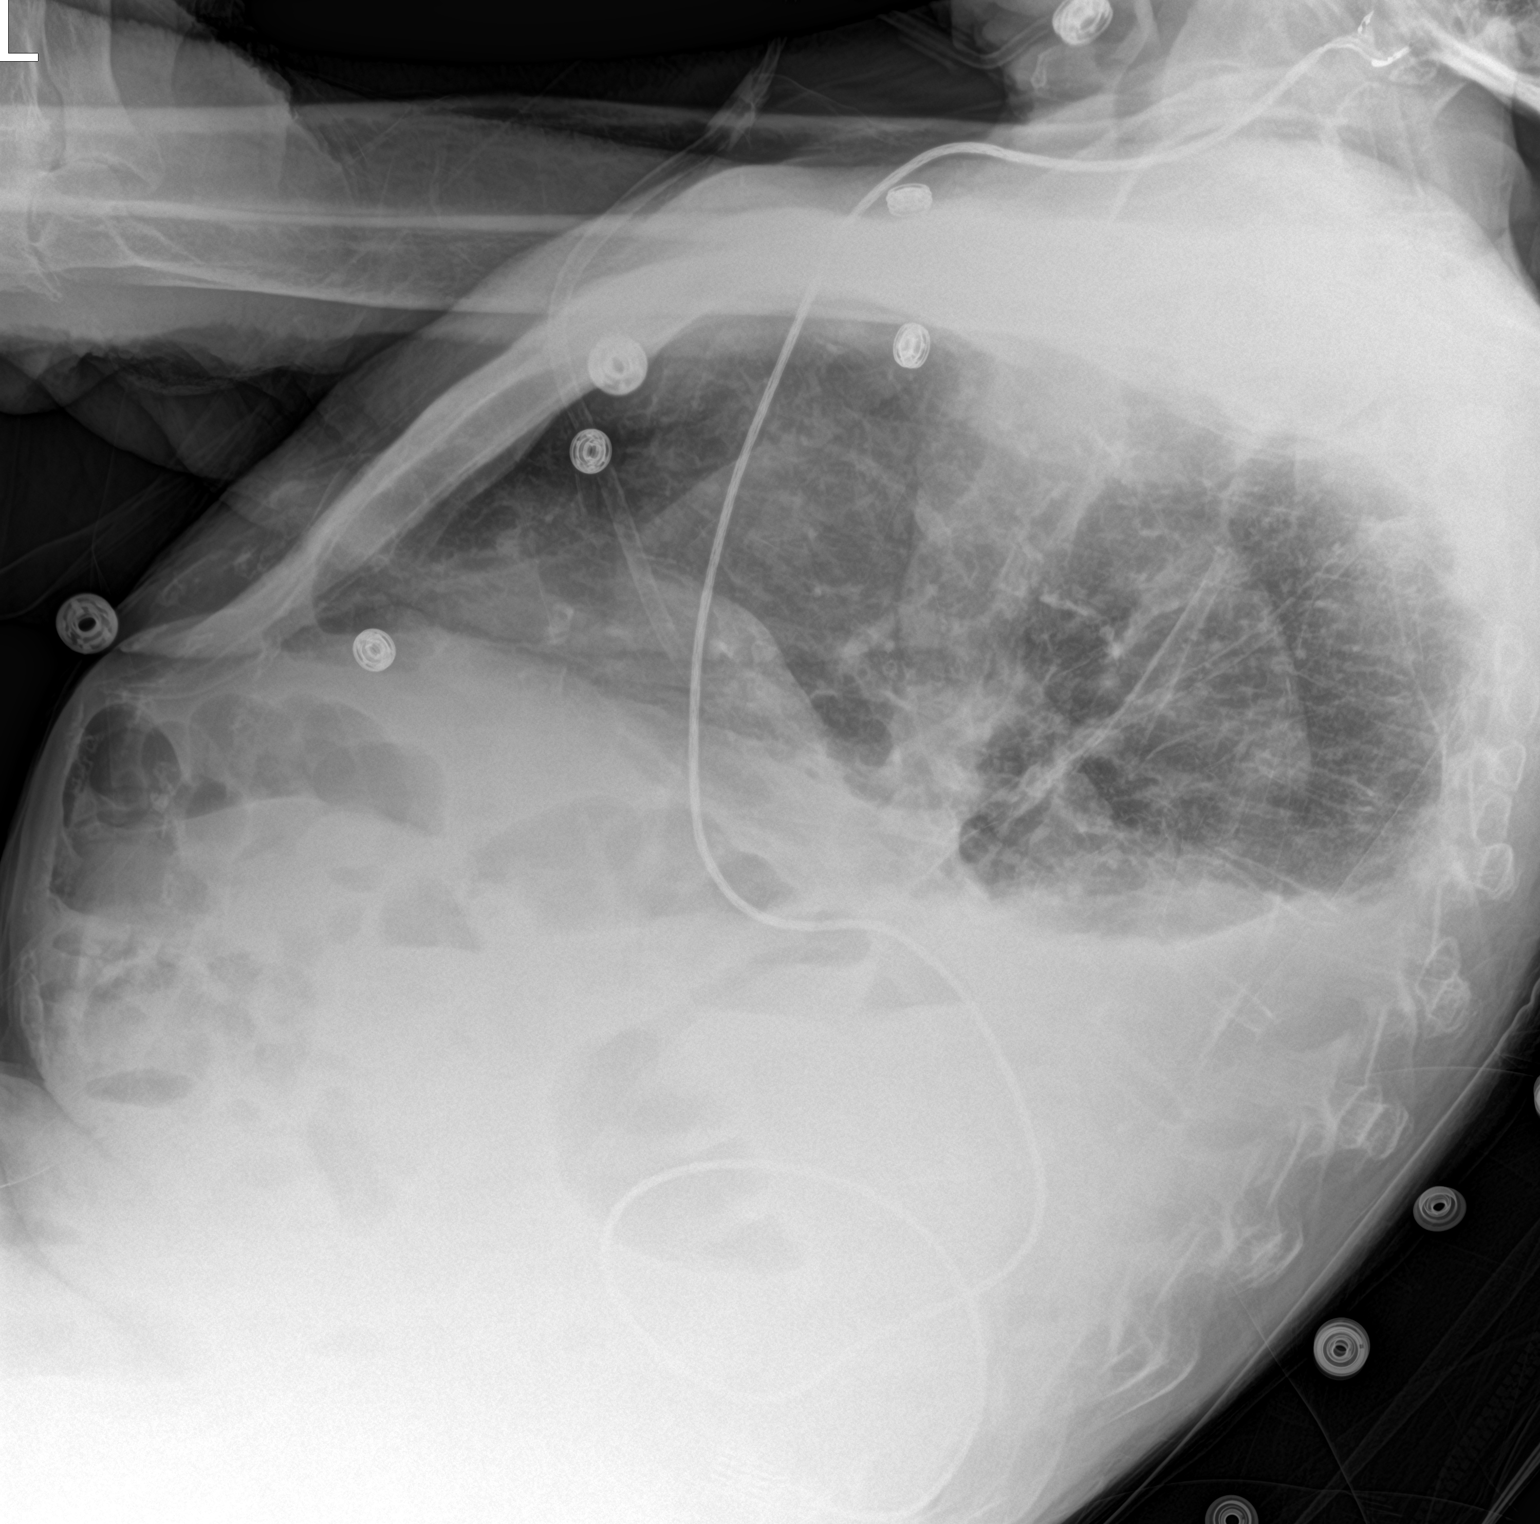

[chest ap]
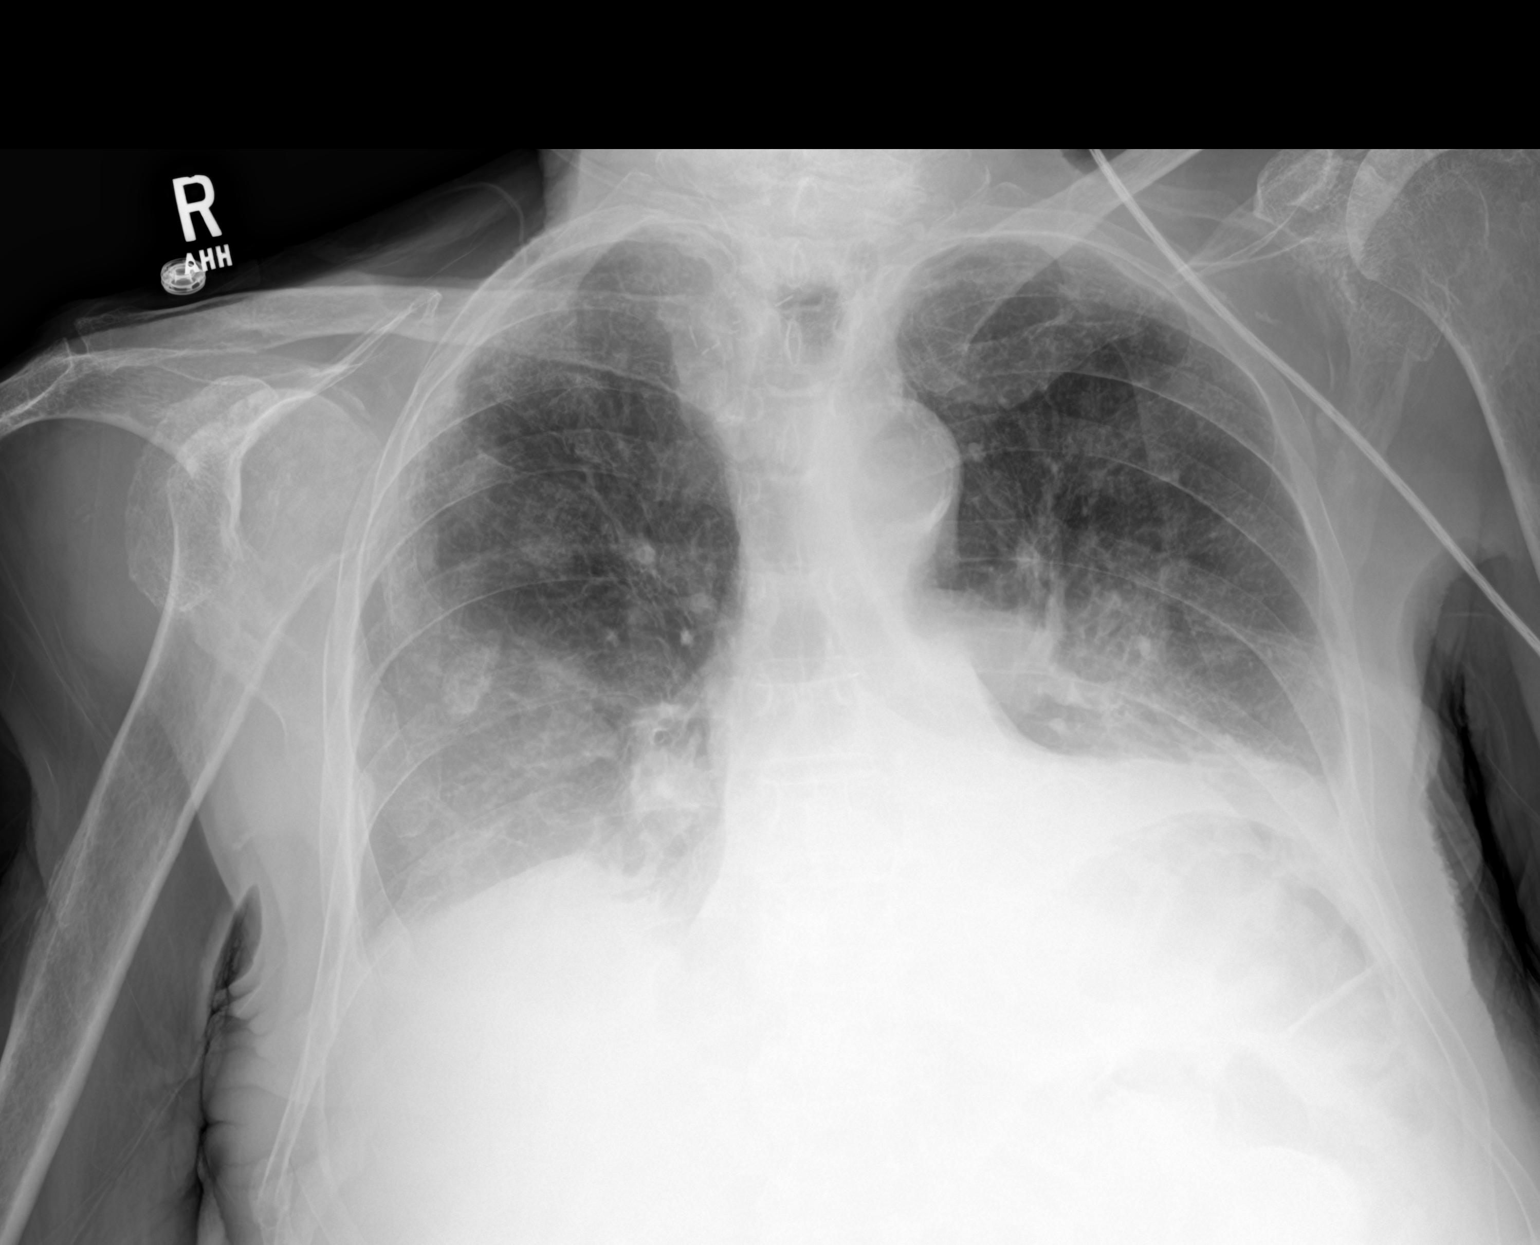

[2 of 2 positions shown; findings below may reference images not displayed]

FINDINGS: Bibasilar opacities are unchanged, compatible with effusions and
atelectasis demonstrated on earlier chest CT. Upper lungs are now
relatively clear. No pneumothorax seen. Heart size and mediastinal
contours are stable. Again noted is dislocation of the RIGHT humeral
head. No acute or suspicious osseous finding.
IMPRESSION: 1. Stable bibasilar opacities, compatible with the pleural effusions
and associated atelectasis demonstrated on earlier chest CT. No new
lung findings.
2. RIGHT humeral head dislocation, as described on multiple prior
reports.

## 2019-08-29 IMAGING — US US THORACENTESIS ASP PLEURAL SPACE W/IMG GUIDE
1 series · 3 of 3 positions shown · non-contrast
Comparison: none

INDICATION: Pneumonia, dyspnea, bilateral pleural effusions. Request made for
therapeutic right thoracentesis.

[Series 1: us thoracentesis asp pleural space w/img guide · 3 of 3 slices shown]
[im 1/3]
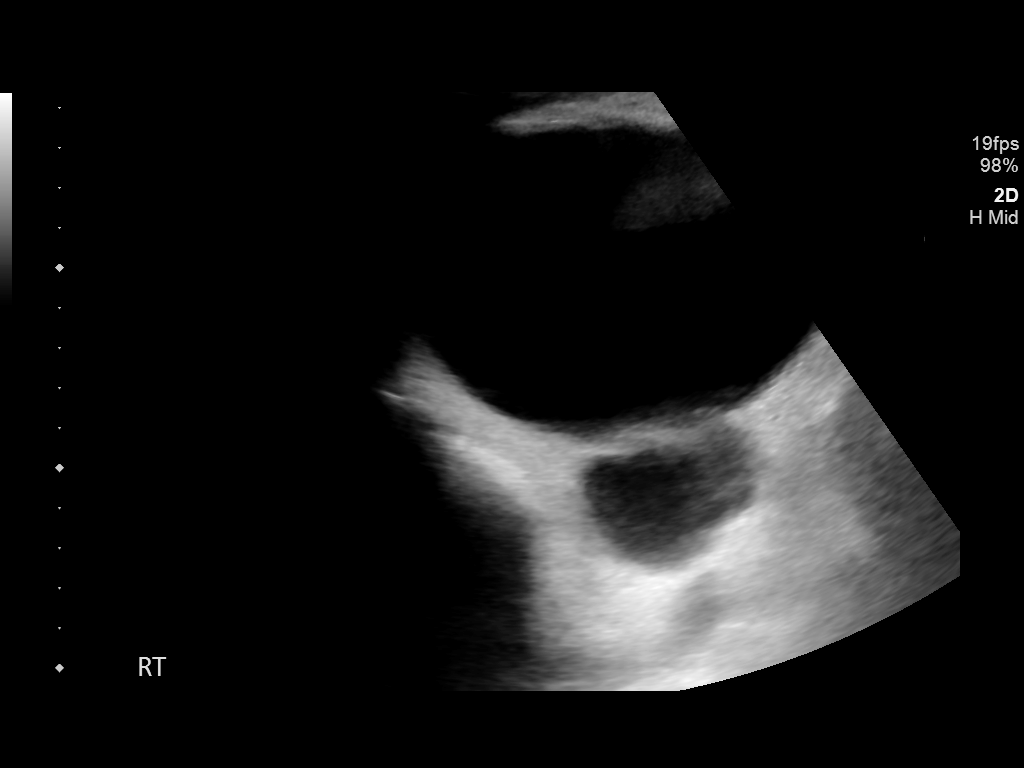
[im 2/3]
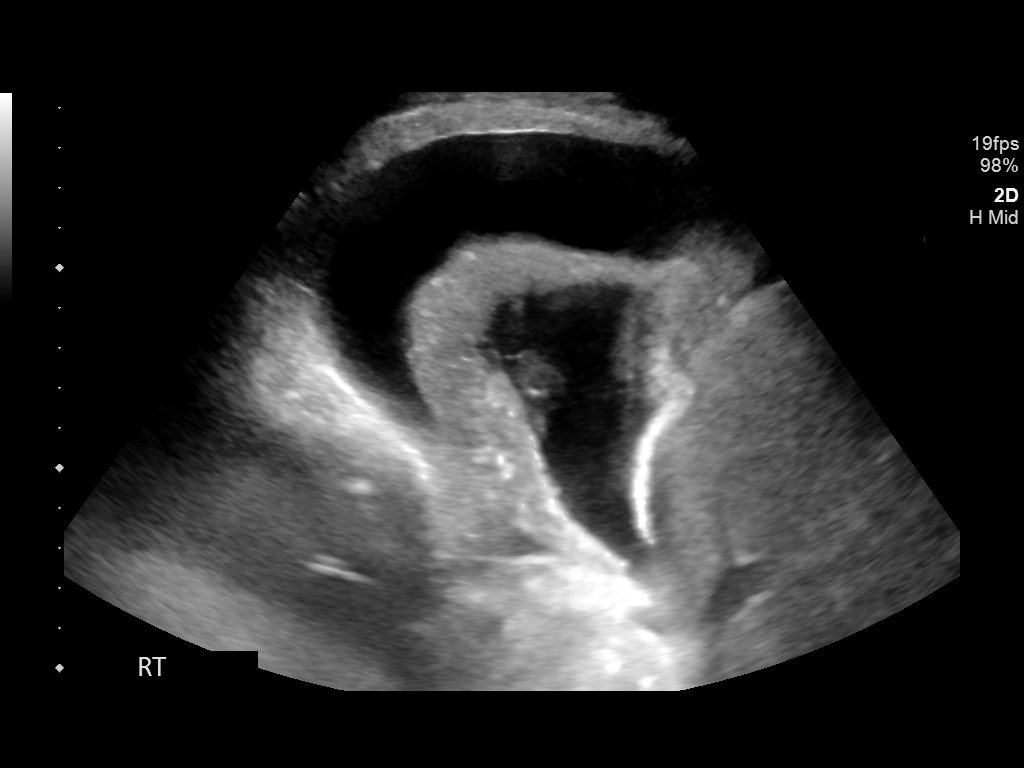
[im 3/3]
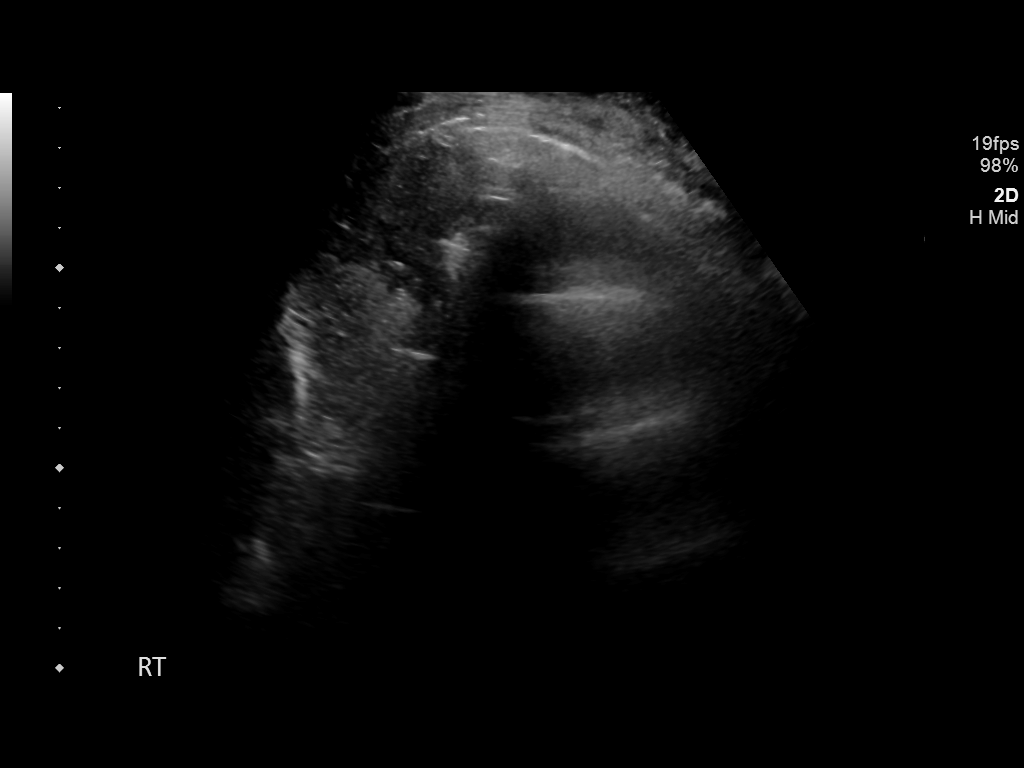

[3 of 3 positions shown; findings below may reference images not displayed]

EXAM:
ULTRASOUND GUIDED THERAPEUTIC RIGHT THORACENTESIS

MEDICATIONS:
None

COMPLICATIONS:
None immediate.

PROCEDURE:
An ultrasound guided thoracentesis was thoroughly discussed with the
patient/patient's son and questions answered. The benefits, risks,
alternatives and complications were also discussed. The patient
understands and wishes to proceed with the procedure. Written
consent was obtained.

Ultrasound was performed to localize and mark an adequate pocket of
fluid in the right chest. The area was then prepped and draped in
the normal sterile fashion. 1% Lidocaine was used for local
anesthesia. Under ultrasound guidance a 6 Fr Safe-T-Centesis
catheter was introduced. Thoracentesis was performed. The catheter
was removed and a dressing applied.
FINDINGS: A total of approximately 750 cc of yellow fluid was removed.
IMPRESSION: Successful ultrasound guided therapeutic right thoracentesis
yielding 750 cc of pleural fluid. Follow-up chest x-ray revealed no
pneumothorax.

## 2019-08-31 IMAGING — CR DG CHEST 2V
2 series · 2 of 2 positions shown · non-contrast
Comparison: Chest x-ray of 09/29/2017

CLINICAL DATA: Cough, low oxygen saturation, non communicative
patient

EXAM:
CHEST - 2 VIEW

[w chest lat]
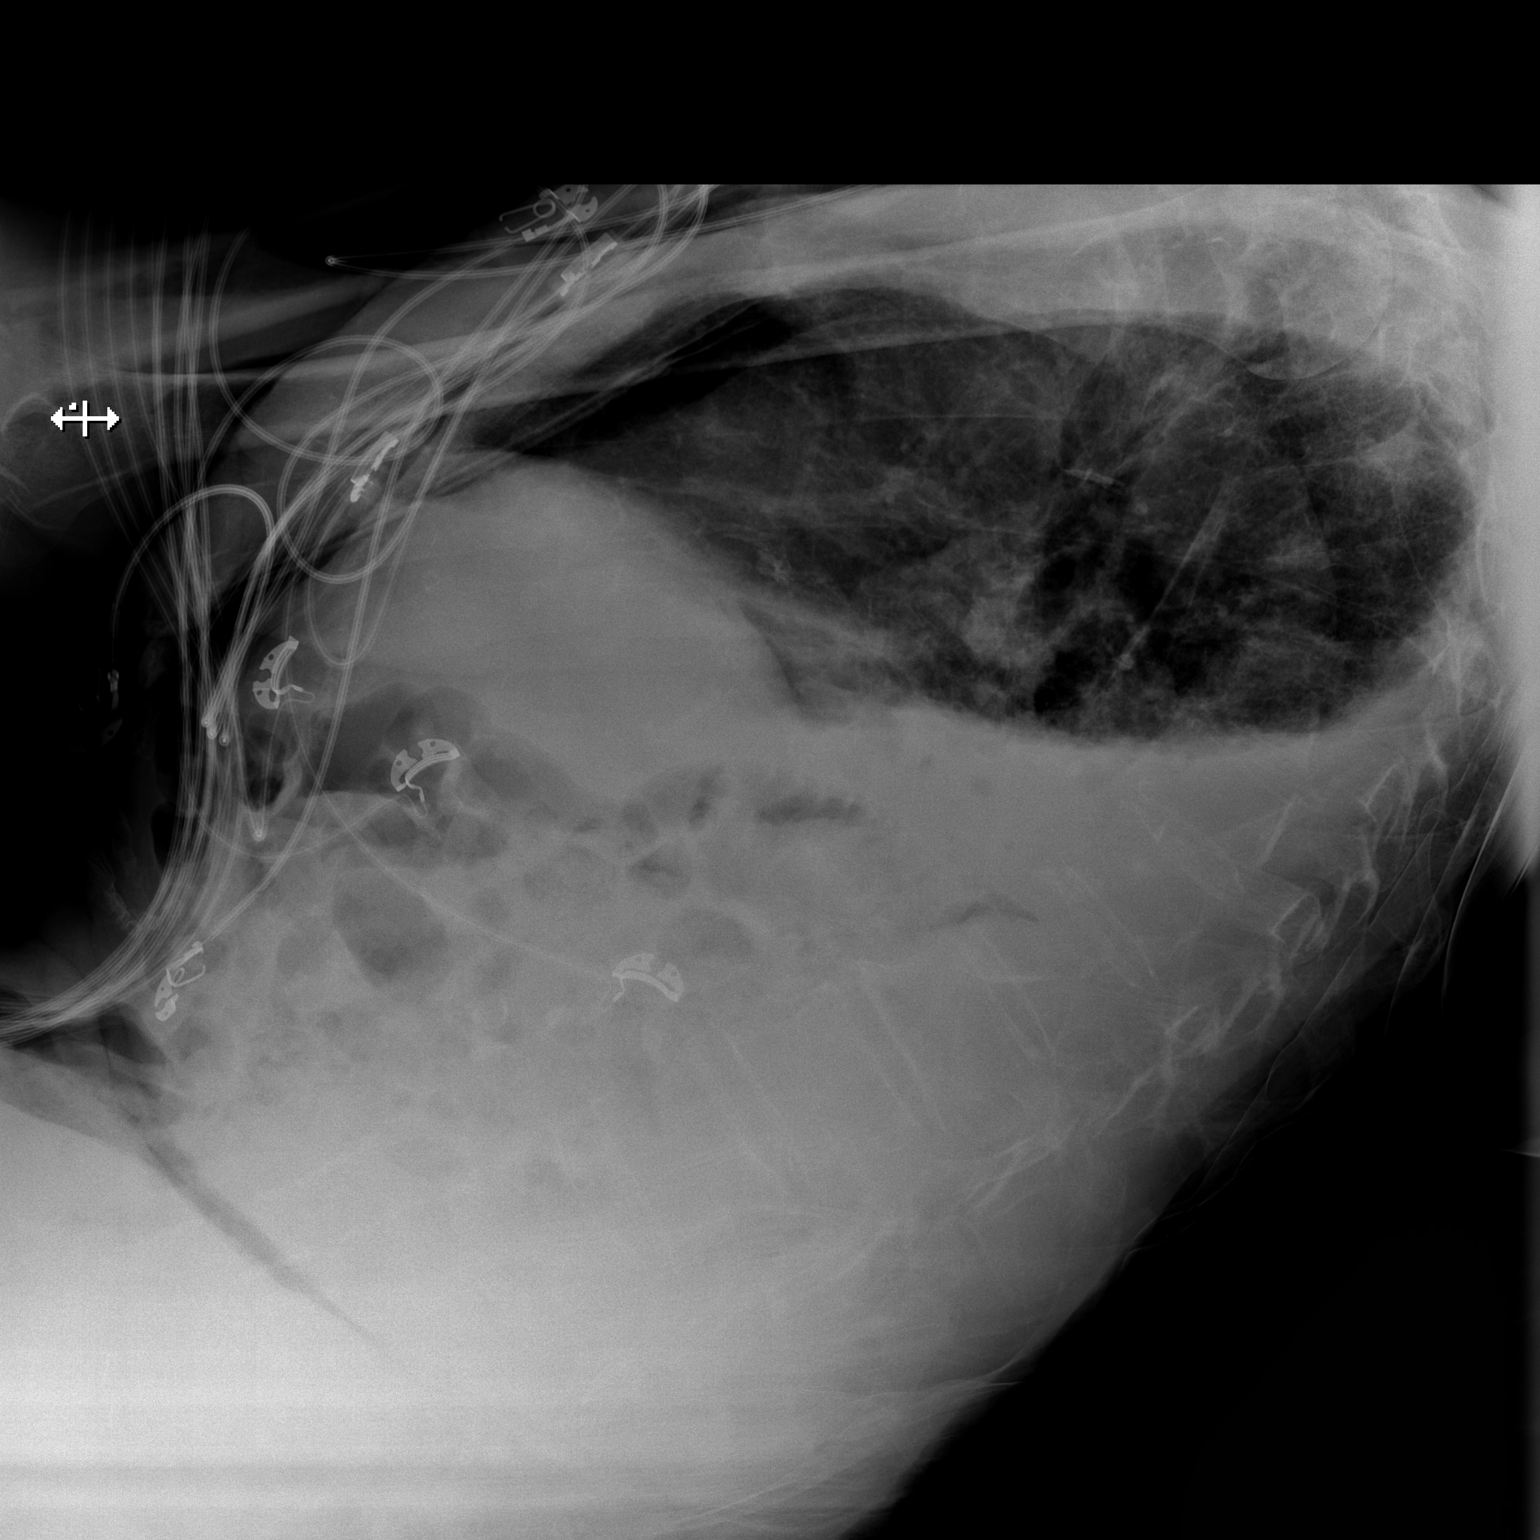

[x chest ap]
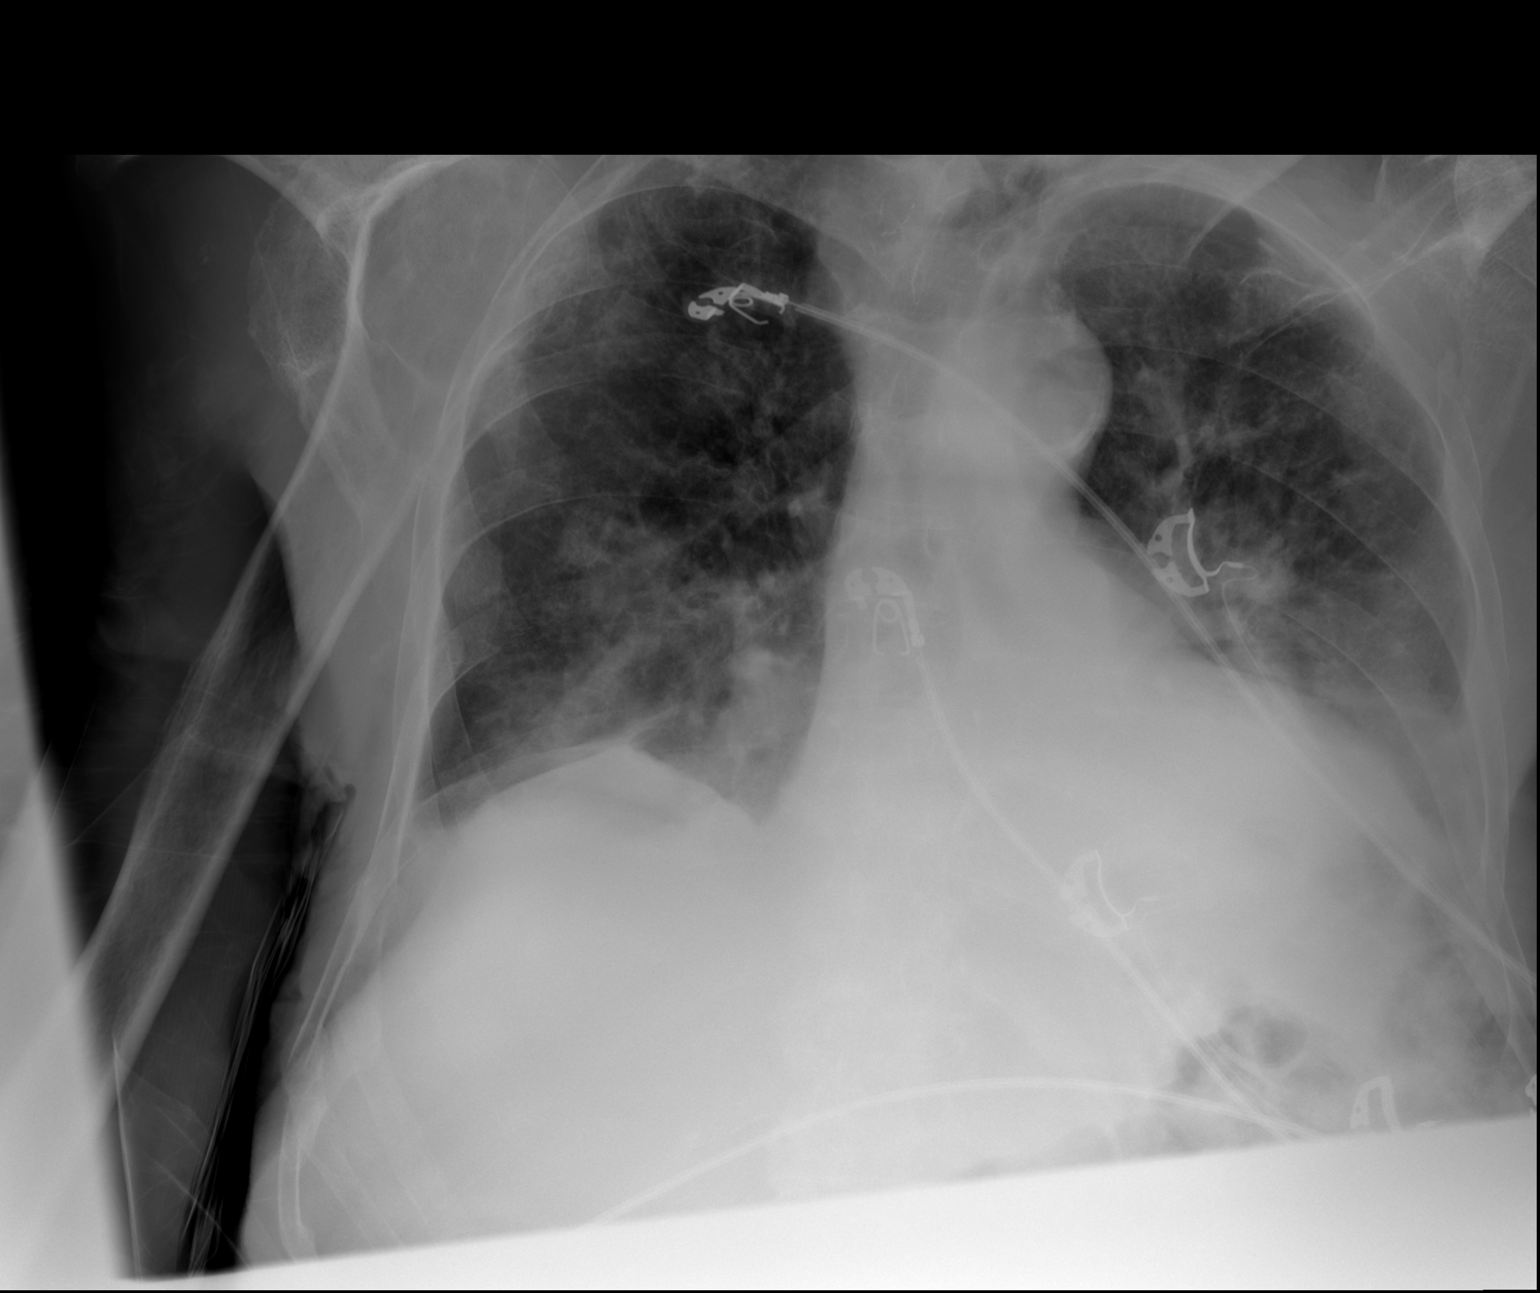

[2 of 2 positions shown; findings below may reference images not displayed]

FINDINGS: The lungs are not well aerated and there has been and increase in
airspace disease at both lung bases. This may be due to developing
pneumonia and possible small effusions bilaterally. Also and element
of congestive heart failure superimposed cannot be excluded. The
heart is mildly enlarged. Old right rib fractures are present and
there is a chronic dislocation of the right shoulder.
IMPRESSION: 1. Poor aeration with bibasilar airspace disease. Suspect pneumonia
and possible effusions but superimposed CHF cannot be excluded.
2. Stable cardiomegaly.
3. Chronic dislocation of the right shoulder.
